# Patient Record
Sex: Male | Born: 1960 | Race: Black or African American | Hispanic: No | Marital: Single | Smoking: Former smoker
Health system: Southern US, Community
[De-identification: ages and names within clinical notes are randomized; demographics above are authoritative.]

## PROBLEM LIST (undated history)

## (undated) DIAGNOSIS — N289 Disorder of kidney and ureter, unspecified: Secondary | ICD-10-CM

## (undated) DIAGNOSIS — I10 Essential (primary) hypertension: Secondary | ICD-10-CM

## (undated) DIAGNOSIS — J40 Bronchitis, not specified as acute or chronic: Secondary | ICD-10-CM

## (undated) HISTORY — PX: DIALYSIS FISTULA CREATION: SHX611

---

## 2011-07-01 ENCOUNTER — Inpatient Hospital Stay: Payer: Self-pay | Admitting: Student

## 2018-05-16 ENCOUNTER — Emergency Department: Payer: Medicare Other

## 2018-05-16 ENCOUNTER — Other Ambulatory Visit: Payer: Self-pay

## 2018-05-16 ENCOUNTER — Encounter: Payer: Self-pay | Admitting: Emergency Medicine

## 2018-05-16 ENCOUNTER — Inpatient Hospital Stay
Admission: EM | Admit: 2018-05-16 | Discharge: 2018-05-19 | DRG: 640 | Disposition: A | Discharge status: Home / Self Care | Payer: Medicare Other | Attending: Specialist | Admitting: Specialist

## 2018-05-16 DIAGNOSIS — F17210 Nicotine dependence, cigarettes, uncomplicated: Secondary | ICD-10-CM | POA: Diagnosis present

## 2018-05-16 DIAGNOSIS — E1122 Type 2 diabetes mellitus with diabetic chronic kidney disease: Secondary | ICD-10-CM | POA: Diagnosis present

## 2018-05-16 DIAGNOSIS — Z79899 Other long term (current) drug therapy: Secondary | ICD-10-CM

## 2018-05-16 DIAGNOSIS — Z992 Dependence on renal dialysis: Secondary | ICD-10-CM

## 2018-05-16 DIAGNOSIS — E1142 Type 2 diabetes mellitus with diabetic polyneuropathy: Secondary | ICD-10-CM | POA: Diagnosis present

## 2018-05-16 DIAGNOSIS — I120 Hypertensive chronic kidney disease with stage 5 chronic kidney disease or end stage renal disease: Secondary | ICD-10-CM | POA: Diagnosis present

## 2018-05-16 DIAGNOSIS — N2581 Secondary hyperparathyroidism of renal origin: Secondary | ICD-10-CM | POA: Diagnosis present

## 2018-05-16 DIAGNOSIS — N186 End stage renal disease: Secondary | ICD-10-CM

## 2018-05-16 DIAGNOSIS — E875 Hyperkalemia: Secondary | ICD-10-CM | POA: Diagnosis present

## 2018-05-16 DIAGNOSIS — D631 Anemia in chronic kidney disease: Secondary | ICD-10-CM | POA: Diagnosis present

## 2018-05-16 HISTORY — DX: Disorder of kidney and ureter, unspecified: N28.9

## 2018-05-16 HISTORY — DX: Essential (primary) hypertension: I10

## 2018-05-16 LAB — CBC
HEMATOCRIT: 28.2 % — AB (ref 40.0–52.0)
HEMOGLOBIN: 9.3 g/dL — AB (ref 13.0–18.0)
MCH: 28 pg (ref 26.0–34.0)
MCHC: 33.1 g/dL (ref 32.0–36.0)
MCV: 84.6 fL (ref 80.0–100.0)
Platelets: 248 10*3/uL (ref 150–440)
RBC: 3.33 MIL/uL — AB (ref 4.40–5.90)
RDW: 20.2 % — ABNORMAL HIGH (ref 11.5–14.5)
WBC: 5.6 10*3/uL (ref 3.8–10.6)

## 2018-05-16 LAB — IRON AND TIBC
IRON: 66 ug/dL (ref 45–182)
SATURATION RATIOS: 37 % (ref 17.9–39.5)
TIBC: 177 ug/dL — AB (ref 250–450)
UIBC: 111 ug/dL

## 2018-05-16 LAB — FERRITIN: FERRITIN: 630 ng/mL — AB (ref 24–336)

## 2018-05-16 LAB — PHOSPHORUS: Phosphorus: 8.6 mg/dL — ABNORMAL HIGH (ref 2.5–4.6)

## 2018-05-16 MED ORDER — HEPARIN SODIUM (PORCINE) 1000 UNIT/ML DIALYSIS
20.0000 [IU]/kg | INTRAMUSCULAR | Status: DC | PRN
  Filled 2018-05-16: qty 2

## 2018-05-16 MED ORDER — METOPROLOL TARTRATE 25 MG PO TABS
25.0000 mg | ORAL_TABLET | Freq: Every day | ORAL | Status: DC
  Administered 2018-05-17 – 2018-05-19 (×2): 25 mg via ORAL
  Filled 2018-05-16 (×4): qty 1

## 2018-05-16 MED ORDER — GABAPENTIN 300 MG PO CAPS
300.0000 mg | ORAL_CAPSULE | Freq: Every day | ORAL | Status: DC | PRN
  Administered 2018-05-17: 300 mg via ORAL
  Filled 2018-05-16: qty 1

## 2018-05-16 MED ORDER — AMLODIPINE BESYLATE 10 MG PO TABS
10.0000 mg | ORAL_TABLET | Freq: Every day | ORAL | Status: DC
  Administered 2018-05-17 – 2018-05-19 (×2): 10 mg via ORAL
  Filled 2018-05-16 (×3): qty 1

## 2018-05-16 MED ORDER — SODIUM CHLORIDE 0.9 % IV SOLN
1.0000 g | Freq: Once | INTRAVENOUS | Status: AC
  Administered 2018-05-16: 1 g via INTRAVENOUS
  Filled 2018-05-16: qty 10

## 2018-05-16 MED ORDER — SEVELAMER CARBONATE 800 MG PO TABS
1600.0000 mg | ORAL_TABLET | Freq: Three times a day (TID) | ORAL | Status: DC
  Administered 2018-05-17 – 2018-05-19 (×5): 1600 mg via ORAL
  Filled 2018-05-16 (×9): qty 2

## 2018-05-16 MED ORDER — NEPRO/CARBSTEADY PO LIQD
237.0000 mL | Freq: Two times a day (BID) | ORAL | Status: DC
  Administered 2018-05-16 – 2018-05-19 (×5): 237 mL via ORAL

## 2018-05-16 MED ORDER — DOCUSATE SODIUM 100 MG PO CAPS: 100.0000 mg | ORAL_CAPSULE | Freq: Two times a day (BID) | ORAL | Status: DC | PRN

## 2018-05-16 MED ORDER — CHLORHEXIDINE GLUCONATE CLOTH 2 % EX PADS
6.0000 | MEDICATED_PAD | Freq: Every day | CUTANEOUS | Status: DC
  Administered 2018-05-17 – 2018-05-18 (×2): 6 via TOPICAL

## 2018-05-16 MED ORDER — HEPARIN SODIUM (PORCINE) 5000 UNIT/ML IJ SOLN
5000.0000 [IU] | Freq: Three times a day (TID) | INTRAMUSCULAR | Status: DC
  Filled 2018-05-16 (×2): qty 1

## 2018-05-16 NOTE — Progress Notes (Signed)
New Admit   Mental Orientation: A&OX4 Telemetry: yes, NSR Assessment: Fistula RUE, good brui and thrill Skin: Pt has black spots on skin all over, intact, per pt this is from old hives that he gets with blood transfusions. Pain: denies Safety Measures: Pt independent in room, family at bedside Admission: Complete 1A Orientation: Complete

## 2018-05-16 NOTE — ED Notes (Signed)
Pt is a dialysis pt has not been dialyzed since last Tuesday [place] is from [place] has no doctor in the area  Pt is awake and alert   Reports shortness of breath on exertion   He has a graft in his left arm    He is placed on cardiac monitor

## 2018-05-16 NOTE — H&P (Signed)
[facility] [place] NAME: Jeremy [name]    MR#:  [id number]  DATE OF BIRTH:  10/05/1960  DATE OF ADMISSION:  05/16/2018  PRIMARY CARE PHYSICIAN: Patient, No Pcp Per   REQUESTING/REFERRING PHYSICIAN: Siadecki  CHIEF COMPLAINT:   Chief Complaint  Patient presents with  . Weakness  . Vascular Access Problem    HISTORY OF PRESENT ILLNESS: Jeremy [name]  is a 57 y.o. male with a known history of hypertension, end-stage renal disease on hemodialysis with dialysis on Tuesday Thursday and Saturday.  Lives in [place] but now planning to move over here, last dialysis was done on Tuesday. Since then he came here, and did not has his dialysis on Thursday or Saturday.  He started feeling chest tightness and came to emergency room for having his hemodialysis. His plans are to permanently stay in [place] now.  PAST MEDICAL HISTORY:   Past Medical History:  Diagnosis Date  . Hypertension   . Renal disorder    kidney failure, dialysis    PAST SURGICAL HISTORY:  Past Surgical History:  Procedure Laterality Date  . DIALYSIS FISTULA CREATION      SOCIAL HISTORY:  Social History   Tobacco Use  . Smoking status: Current Every Day Smoker    Packs/day: 1.00    Types: Cigarettes  . Smokeless tobacco: Never Used  Substance Use Topics  . Alcohol use: Never    Frequency: Never    FAMILY HISTORY:  Family History  Problem Relation Age of Onset  . Hypertension Mother     DRUG ALLERGIES: No Known Allergies  REVIEW OF SYSTEMS:   CONSTITUTIONAL: No fever, fatigue or weakness.  EYES: No blurred or double vision.  EARS, NOSE, AND THROAT: No tinnitus or ear pain.  RESPIRATORY: No cough, shortness of breath, wheezing or hemoptysis.  CARDIOVASCULAR: No chest pain, orthopnea, edema.  GASTROINTESTINAL: No nausea, vomiting, diarrhea or abdominal pain.  GENITOURINARY: No dysuria, hematuria.  ENDOCRINE: No polyuria, nocturia,  HEMATOLOGY: No anemia,  easy bruising or bleeding SKIN: No rash or lesion. MUSCULOSKELETAL: No joint pain or arthritis.   NEUROLOGIC: No tingling, numbness, weakness.  PSYCHIATRY: No anxiety or depression.   MEDICATIONS AT HOME:  Prior to Admission medications   Medication Sig Start Date End Date Taking? Authorizing Provider  amLODipine (NORVASC) 10 MG tablet Take 10 mg by mouth daily. 03/29/18  Yes [provider]  gabapentin (NEURONTIN) 300 MG capsule Take 300 mg by mouth daily as needed. 10/01/15  Yes [provider]  metoprolol tartrate (LOPRESSOR) 25 MG tablet Take 25 mg by mouth daily. 03/29/18  Yes [provider]  sevelamer carbonate (RENVELA) 800 MG tablet Take 1,600 mg by mouth 3 (three) times daily with meals. 03/02/18  Yes [provider]      PHYSICAL EXAMINATION:   VITAL SIGNS: Blood pressure (!) 142/69, pulse 64, temperature 98.6 F (37 C), temperature source Oral, resp. rate 20, height 5\' 6"  (1.676 m), weight 65.3 kg, SpO2 94 %.  GENERAL:  57 y.o.-year-old patient lying in the bed with no acute distress.  EYES: Pupils equal, round, reactive to light and accommodation. No scleral icterus. Extraocular muscles intact.  HEENT: Head atraumatic, normocephalic. Oropharynx and nasopharynx clear.  NECK:  Supple, no jugular venous distention. No thyroid enlargement, no tenderness.  LUNGS: Normal breath sounds bilaterally, no wheezing, some crepitation. No use of accessory muscles of respiration.  CARDIOVASCULAR: S1, S2 normal. No murmurs, rubs, or gallops.  ABDOMEN: Soft, nontender,  nondistended. Bowel sounds present. No organomegaly or mass.  EXTREMITIES: No pedal edema, cyanosis, or clubbing.  Left AV fistula present NEUROLOGIC: Cranial nerves II through XII are intact. Muscle strength 5/5 in all extremities. Sensation intact. Gait not checked.  PSYCHIATRIC: The patient is alert and oriented x 3.  SKIN: No obvious rash, lesion, or ulcer.   LABORATORY PANEL:    CBC Recent Labs  Lab 05/16/18 1210  WBC 5.6  HGB 9.3*  HCT 28.2*  PLT 248  MCV 84.6  MCH 28.0  MCHC 33.1  RDW 20.2*   ------------------------------------------------------------------------------------------------------------------  Chemistries  Recent Labs  Lab 05/16/18 1210  NA 141  K 6.4*  CL 105  CO2 17*  GLUCOSE 141*  BUN 127*  CREATININE 20.71*  CALCIUM 8.2*   ------------------------------------------------------------------------------------------------------------------ estimated creatinine clearance is 3.6 mL/min (A) (by C-G formula based on SCr of 20.71 mg/dL (H)). ------------------------------------------------------------------------------------------------------------------ No results for input(s): TSH, T4TOTAL, T3FREE, THYROIDAB in the last 72 hours.  Invalid input(s): FREET3   Coagulation profile No results for input(s): INR, PROTIME in the last 168 hours. ------------------------------------------------------------------------------------------------------------------- No results for input(s): DDIMER in the last 72 hours. -------------------------------------------------------------------------------------------------------------------  Cardiac Enzymes No results for input(s): CKMB, TROPONINI, MYOGLOBIN in the last 168 hours.  Invalid input(s): CK ------------------------------------------------------------------------------------------------------------------ Invalid input(s): POCBNP  ---------------------------------------------------------------------------------------------------------------  Urinalysis No results found for: COLORURINE, APPEARANCEUR, LABSPEC, PHURINE, GLUCOSEU, HGBUR, BILIRUBINUR, KETONESUR, PROTEINUR, UROBILINOGEN, NITRITE, LEUKOCYTESUR   RADIOLOGY: Dg Chest 2 View  Result Date: 05/16/2018 CLINICAL DATA:  Weakness. Shortness of breath. Last dialysis treatment was 5 days ago. EXAM: CHEST - 2 VIEW COMPARISON:  None.  FINDINGS: Heart is enlarged. Mild pulmonary vascular congestion is present. No significant airspace consolidation is present. No effusions are present. Left subclavian stent is noted. The visualized soft tissues and bony thorax are otherwise unremarkable. IMPRESSION: 1. Cardiomegaly and mild pulmonary vascular congestion likely reflects early congestive heart failure. 2. No significant airspace consolidation to suggest infection. Electronically Signed   By: San Morelle M.D.   On: 05/16/2018 12:53    EKG: Orders placed or performed during the hospital encounter of 05/16/18  . ED EKG  . ED EKG    IMPRESSION AND PLAN:  *Hyperkalemia Missed hemodialysis. Nephrology has seen the patient and plan for urgent dialysis today.  *End-stage renal disease on hemodialysis Missed last 2 dialysis, nephrologist is aware. He will need to set up outpatient hemodialysis here as moved to [place] now.  *Hypertension Continue home medications for now and monitor.  *Neuropathy Continue gabapentin.  *Active smoking Counseled to quit smoking and offered nicotine patch.  Time spent 4 minutes for this.  Patient moved from [place], he will need to have a primary care doctor and hemodialysis as outpatient set up before discharge.  All the records are reviewed and case discussed with ED provider. Management plans discussed with the patient, family and they are in agreement.  CODE STATUS: Full.   TOTAL TIME TAKING CARE OF THIS PATIENT: 45 minutes.    Vaughan Basta M.D on 05/16/2018   Between 7am to 6pm - Pager - [phone]  After 6pm go to [email or web address] - password [email or web address] [facility]  Office  [phone]  CC: Primary care physician; Patient, No Pcp Per   Note: This dictation was prepared with Dragon dictation along with smaller phrase technology. Any transcriptional errors that result from this process are unintentional.

## 2018-05-16 NOTE — ED Provider Notes (Signed)
[facility] Emergency Department Provider Note ____________________________________________   First MD Initiated Contact with Patient 05/16/18 1414     (approximate)  I have reviewed the triage vital signs and the nursing notes.   HISTORY  Chief Complaint Weakness and Vascular Access Problem    HPI Jeremy [name] is a 57 y.o. male with PMH as noted below including hypertension and ESRD on dialysis who presents with shortness of breath, gradual onset over the last several days, and persistent course.  The patient reports some generalized weakness.  He states that he just moved here from [place] this week, and has not had dialysis in 5 days, since last Tuesday.  The patient states that he has no doctors here or dialysis set up at this time.  Past Medical History:  Diagnosis Date  . Hypertension   . Renal disorder    kidney failure, dialysis    There are no active problems to display for this patient.   Past Surgical History:  Procedure Laterality Date  . DIALYSIS FISTULA CREATION      Prior to Admission medications   Medication Sig Start Date End Date Taking? Authorizing Provider  amLODipine (NORVASC) 10 MG tablet Take 10 mg by mouth daily. 03/29/18  Yes [provider]  gabapentin (NEURONTIN) 300 MG capsule Take 300 mg by mouth daily as needed. 10/01/15  Yes [provider]  metoprolol tartrate (LOPRESSOR) 25 MG tablet Take 25 mg by mouth daily. 03/29/18  Yes [provider]  sevelamer carbonate (RENVELA) 800 MG tablet Take 1,600 mg by mouth 3 (three) times daily with meals. 03/02/18  Yes [provider]    Allergies Patient has no known allergies.  No family history on file.  Social History Social History   Tobacco Use  . Smoking status: Current Every Day Smoker    Packs/day: 1.00    Types: Cigarettes  . Smokeless tobacco: Never Used  Substance Use Topics  . Alcohol use: Never    Frequency: Never  .  Drug use: Not on file    Review of Systems  Constitutional: No fever. Eyes: No redness. ENT: No sore throat. Cardiovascular: Denies chest pain. Respiratory: Positive for shortness of breath. Gastrointestinal: No vomiting.  Positive for diarrhea.  Genitourinary: Negative for dysuria.  Musculoskeletal: Negative for back pain. Skin: Negative for rash. Neurological: Negative for headache.   ____________________________________________   PHYSICAL EXAM:  VITAL SIGNS: ED Triage Vitals [05/16/18 1201]  Enc Vitals Group     BP 139/65     Pulse Rate 65     Resp 18     Temp 98.6 F (37 C)     Temp Source Oral     SpO2 98 %     Weight 144 lb (65.3 kg)     Height 5\' 6"  (1.676 m)     Head Circumference      Peak Flow      Pain Score 0     Pain Loc      Pain Edu?      Excl. in [place]?     Constitutional: Alert and oriented.  Relatively well appearing and in no acute distress. Eyes: Conjunctivae are normal.  Head: Atraumatic. Nose: No congestion/rhinnorhea. Mouth/Throat: Mucous membranes are somewhat dry.   Neck: Normal range of motion.  Cardiovascular: Normal rate, regular rhythm. Grossly normal heart sounds.  Good peripheral circulation. Respiratory: Normal respiratory effort.  No retractions.  Decreased breath sounds to bases with rhonchi bilaterally. Gastrointestinal: Soft and nontender.  No distention.  Genitourinary: No flank tenderness. Musculoskeletal: Extremities warm and well perfused.  Neurologic:  Normal speech and language. No gross focal neurologic deficits are appreciated.  Skin:  Skin is warm and dry. No rash noted. Psychiatric: Mood and affect are normal. Speech and behavior are normal.  ____________________________________________   LABS (all labs ordered are listed, but only abnormal results are displayed)  Labs Reviewed  BASIC METABOLIC PANEL - Abnormal; Notable for the following components:      Result Value   Potassium 6.4 (*)    CO2 17 (*)     Glucose, Bld 141 (*)    BUN 127 (*)    Creatinine, Ser 20.71 (*)    Calcium 8.2 (*)    GFR calc non Af Amer 2 (*)    GFR calc Af Amer 2 (*)    Anion gap 19 (*)    All other components within normal limits  CBC - Abnormal; Notable for the following components:   RBC 3.33 (*)    Hemoglobin 9.3 (*)    HCT 28.2 (*)    RDW 20.2 (*)    All other components within normal limits  URINALYSIS, COMPLETE (UACMP) WITH MICROSCOPIC   ____________________________________________  EKG  ED ECG REPORT I, Arta Silence, the attending physician, personally viewed and interpreted this ECG.  Date: 05/16/2018 EKG Time: 1211 Rate: 64 Rhythm: normal sinus rhythm QRS Axis: normal Intervals: normal ST/T Wave abnormalities: Peaked T waves laterally Narrative Interpretation: no evidence of acute ischemia  ____________________________________________  RADIOLOGY  CXR: Pulmonary vascular congestion  ____________________________________________   PROCEDURES  Procedure(s) performed: No  Procedures  Critical Care performed: No ____________________________________________   INITIAL IMPRESSION / ASSESSMENT AND PLAN / ED COURSE  Pertinent labs & imaging results that were available during my care of the patient were reviewed by me and considered in my medical decision making (see chart for details).  57 year old male with history of ESRD on dialysis presents with shortness of breath after not having received dialysis in almost a week due to recently moving to the area without any medical care set up.  On exam, the patient is relatively well-appearing and his vital signs are normal except for borderline low O2 saturation.  He has no respiratory distress at this time.  The remainder of the exam is as described above.  Initial labs reveal significantly elevated creatinine and hyperkalemia with peaked T waves on EKG.  The patient will require urgent dialysis today.  I consulted Dr. Candiss Norse from  nephrology who has evaluated the patient in the ED and advises that the patient will be able to get dialysis today.  He also recommends admitting the patient as he may need more than one treatment and will need to be set up with outpatient follow-up.  The patient agrees with this plan.  I signed the patient out to the hospitalist Dr. Estanislado Pandy.  ____________________________________________   FINAL CLINICAL IMPRESSION(S) / ED DIAGNOSES  Final diagnoses:  Hyperkalemia  End stage renal disease ([place])      NEW MEDICATIONS STARTED DURING THIS VISIT:  New Prescriptions   No medications on file     Note:  This document was prepared using Dragon voice recognition software and may include unintentional dictation errors.   Arta Silence, MD 05/16/18 [phone]

## 2018-05-16 NOTE — Progress Notes (Signed)
This note also relates to the following rows which could not be included: Pulse Rate - Cannot attach notes to unvalidated device data Resp - Cannot attach notes to unvalidated device data BP - Cannot attach notes to unvalidated device data  Hd complete

## 2018-05-16 NOTE — Consult Note (Signed)
Date: 05/16/2018                  Patient Name:  Jeremy [name]  MRN: [id number]  DOB: 04/10/1961  Age / Sex: 57 y.o., male         PCP: No primary care provider on file.                 Service Requesting Consult: ER/ Arta Silence, MD                 Reason for Consult: ESRD            History of Present Illness: Patient is a 57 y.o. male with medical problems of ESRD who was admitted to [facility] on 05/16/2018.  Patient presented to the emergency room for feeling poorly.  He has not been dialyzed since Tuesday.  He moved down from [place] area without having dialysis unit set up. In the emergency room, blood pressure is 136/65, sats 94% on room air However, labs show critically elevated BUN, creatinine of 20.7, potassium 6.4.  Chest x-ray shows cardiomegaly and mild pulmonary vascular congestion. Urgent nephrology evaluation has been requested for hyperkalemia, dialysis as well as to set up local dialysis unit.  Patient states he has moved down for now and is looking for a place to stay. He is accompanied in the emergency room with his fiance/girlfriend  Medications: Outpatient medications:  (Not in a hospital admission)  Current medications: Current Facility-Administered Medications  Medication Dose Route Frequency Provider Last Rate Last Dose  . calcium gluconate 1 g in sodium chloride 0.9 % 100 mL IVPB  1 g Intravenous Once Arta Silence, MD 330 mL/hr at 05/16/18 1456 1 g at 05/16/18 1456   Current Outpatient Medications  Medication Sig Dispense Refill  . amLODipine (NORVASC) 10 MG tablet Take 10 mg by mouth daily.    Marland Kitchen gabapentin (NEURONTIN) 300 MG capsule Take 300 mg by mouth daily as needed.    . metoprolol tartrate (LOPRESSOR) 25 MG tablet Take 25 mg by mouth daily.    . sevelamer carbonate (RENVELA) 800 MG tablet Take 1,600 mg by mouth 3 (three) times daily with meals.        Allergies: No Known Allergies    Past Medical History: Past Medical History:   Diagnosis Date  . Hypertension   . Renal disorder    kidney failure, dialysis     Past Surgical History: Past Surgical History:  Procedure Laterality Date  . DIALYSIS FISTULA CREATION       Family History: No family history on file.   Social History: Social History   Socioeconomic History  . Marital status: Divorced    Spouse name: Not on file  . Number of children: Not on file  . Years of education: Not on file  . Highest education level: Not on file  Occupational History  . Not on file  Social Needs  . Financial resource strain: Not on file  . Food insecurity:    Worry: Not on file    Inability: Not on file  . Transportation needs:    Medical: Not on file    Non-medical: Not on file  Tobacco Use  . Smoking status: Current Every Day Smoker    Packs/day: 1.00    Types: Cigarettes  . Smokeless tobacco: Never Used  Substance and Sexual Activity  . Alcohol use: Never    Frequency: Never  . Drug use: Not on file  . Sexual activity:  Not on file  Lifestyle  . Physical activity:    Days per week: Not on file    Minutes per session: Not on file  . Stress: Not on file  Relationships  . Social connections:    Talks on phone: Not on file    Gets together: Not on file    Attends religious service: Not on file    Active member of club or organization: Not on file    Attends meetings of clubs or organizations: Not on file    Relationship status: Not on file  . Intimate partner violence:    Fear of current or ex partner: Not on file    Emotionally abused: Not on file    Physically abused: Not on file    Forced sexual activity: Not on file  Other Topics Concern  . Not on file  Social History Narrative  . Not on file     Review of Systems: Gen: No weight changes however patient states that he is unable to gain much weight HEENT: No vision or hearing problems CV: No chest pain or shortness of breath Resp: No cough or sputum production GI: No nausea or  vomiting GU : Patient is anuric.  Denies any hematuria MS: No complaints Derm: No complaints Psych: No complaints Heme: No complaints Neuro: No complaints Endocrine.  No complaints  Vital Signs: Blood pressure 136/65, pulse 64, temperature 98.6 F (37 C), temperature source Oral, resp. rate 17, height 5\' 6"  (1.676 m), weight 65.3 kg, SpO2 96 %.  No intake or output data in the 24 hours ending 05/16/18 1459  Weight trends: Filed Weights   05/16/18 1201  Weight: 65.3 kg    Physical Exam: General:  No acute distress, laying in the bed, uremic breath  HEENT  anicteric, moist oral mucous membranes  Neck:  Supple, no JVD  Lungs:  Clear to auscultation bilaterally  Heart::  Regular rate and rhythm, normal sinus on telemetry  Abdomen:  Soft, nontender  Extremities:  No leg edema.   Neurologic:  Alert, oriented  Skin:  Warm, no acute rashes  Access:  Left arm AVF          Lab results: Basic Metabolic Panel: Recent Labs  Lab 05/16/18 1210  NA 141  K 6.4*  CL 105  CO2 17*  GLUCOSE 141*  BUN 127*  CREATININE 20.71*  CALCIUM 8.2*    Liver Function Tests: No results for input(s): AST, ALT, ALKPHOS, BILITOT, PROT, ALBUMIN in the last 168 hours. No results for input(s): LIPASE, AMYLASE in the last 168 hours. No results for input(s): AMMONIA in the last 168 hours.  CBC: Recent Labs  Lab 05/16/18 1210  WBC 5.6  HGB 9.3*  HCT 28.2*  MCV 84.6  PLT 248    Cardiac Enzymes: No results for input(s): CKTOTAL, TROPONINI in the last 168 hours.  BNP: Invalid input(s): POCBNP  CBG: No results for input(s): GLUCAP in the last 168 hours.  Microbiology: No results found for this or any previous visit (from the past 720 hour(s)).   Coagulation Studies: No results for input(s): LABPROT, INR in the last 72 hours.  Urinalysis: No results for input(s): COLORURINE, LABSPEC, PHURINE, GLUCOSEU, HGBUR, BILIRUBINUR, KETONESUR, PROTEINUR, UROBILINOGEN, NITRITE, LEUKOCYTESUR in  the last 72 hours.  Invalid input(s): APPERANCEUR      Imaging: Dg Chest 2 View  Result Date: 05/16/2018 CLINICAL DATA:  Weakness. Shortness of breath. Last dialysis treatment was 5 days ago. EXAM: CHEST - 2 VIEW  COMPARISON:  None. FINDINGS: Heart is enlarged. Mild pulmonary vascular congestion is present. No significant airspace consolidation is present. No effusions are present. Left subclavian stent is noted. The visualized soft tissues and bony thorax are otherwise unremarkable. IMPRESSION: 1. Cardiomegaly and mild pulmonary vascular congestion likely reflects early congestive heart failure. 2. No significant airspace consolidation to suggest infection. Electronically Signed   By: San Morelle M.D.   On: 05/16/2018 12:53      Assessment & Plan: Pt is a 57 y.o. African American  male with ESRD on HD since 2012, HTN, h/o DM now diet controlled, h/o CHF, Cardiac ablation, h/o AVF stent and angioplasty, was admitted on 05/16/2018 with hyperkalemia  No dialysis unit  1.  End-stage renal disease Patient presents with uremia with critically elevated BUN, Cr and potassium. He does not currently have a dialysis unit.  For hyperkalemia and uremia, we will do a short treatment today.  We will also do another treatment tomorrow and contact discharge planning service to set up the unit in the local area.  2.  Anemia chronic kidney disease Monitor hemoglobin and give EPO with hemodialysis  3.  Secondary hyperparathyroidism Monitor phosphorus during hospital admission May continue home dose of sevelamer  4. hyperkalemia Low potassium diet Expected to correct with dialysis today.      LOS: 0 Courteney Alderete Candiss Norse 9/8/20192:59 PM  [place], [place]  Note: This note was prepared with Dragon dictation. Any transcription errors are unintentional

## 2018-05-16 NOTE — ED Triage Notes (Signed)
Patient presents to the ED with weakness and shortness of breath.  Patient states his last dialysis was on Tuesday.  Patient just moved from [place] and has not yet established a nephrologist in [place].

## 2018-05-16 NOTE — Progress Notes (Signed)
This note also relates to the following rows which could not be included: Pulse Rate - Cannot attach notes to unvalidated device data Resp - Cannot attach notes to unvalidated device data BP - Cannot attach notes to unvalidated device data  Hd started  

## 2018-05-16 NOTE — ED Notes (Signed)
No sticks bp  l  Arm

## 2018-05-17 LAB — BASIC METABOLIC PANEL
Anion gap: 19 — ABNORMAL HIGH (ref 5–15)
Anion gap: 14 (ref 5–15)
BUN: 127 mg/dL — AB (ref 6–20)
BUN: 88 mg/dL — AB (ref 6–20)
CALCIUM: 7.7 mg/dL — AB (ref 8.9–10.3)
CO2: 17 mmol/L — ABNORMAL LOW (ref 22–32)
CO2: 24 mmol/L (ref 22–32)
CREATININE: 20.71 mg/dL — AB (ref 0.61–1.24)
Calcium: 8.2 mg/dL — ABNORMAL LOW (ref 8.9–10.3)
Chloride: 105 mmol/L (ref 98–111)
Chloride: 103 mmol/L (ref 98–111)
Creatinine, Ser: 14.35 mg/dL — ABNORMAL HIGH (ref 0.61–1.24)
GFR calc Af Amer: 2 mL/min — ABNORMAL LOW (ref >60)
GFR calc Af Amer: 4 mL/min — ABNORMAL LOW (ref >60)
GFR, EST NON AFRICAN AMERICAN: 2 mL/min — AB (ref >60)
GFR, EST NON AFRICAN AMERICAN: 3 mL/min — AB (ref >60)
GLUCOSE: 101 mg/dL — AB (ref 70–99)
Glucose, Bld: 141 mg/dL — ABNORMAL HIGH (ref 70–99)
POTASSIUM: 6.4 mmol/L — AB (ref 3.5–5.1)
POTASSIUM: 4.5 mmol/L (ref 3.5–5.1)
Sodium: 141 mmol/L (ref 135–145)
Sodium: 141 mmol/L (ref 135–145)

## 2018-05-17 LAB — CBC
HEMATOCRIT: 26.2 % — AB (ref 40.0–52.0)
Hemoglobin: 9 g/dL — ABNORMAL LOW (ref 13.0–18.0)
MCH: 28.6 pg (ref 26.0–34.0)
MCHC: 34.4 g/dL (ref 32.0–36.0)
MCV: 83.1 fL (ref 80.0–100.0)
Platelets: 209 10*3/uL (ref 150–440)
RBC: 3.15 MIL/uL — ABNORMAL LOW (ref 4.40–5.90)
RDW: 19.9 % — AB (ref 11.5–14.5)
WBC: 4 10*3/uL (ref 3.8–10.6)

## 2018-05-17 MED ORDER — RENA-VITE PO TABS
1.0000 | ORAL_TABLET | Freq: Every day | ORAL | Status: DC
  Administered 2018-05-17: 1 via ORAL
  Filled 2018-05-17 (×3): qty 1

## 2018-05-17 MED ORDER — VITAMIN C 500 MG PO TABS
250.0000 mg | ORAL_TABLET | Freq: Two times a day (BID) | ORAL | Status: DC
  Administered 2018-05-18 – 2018-05-19 (×2): 250 mg via ORAL
  Filled 2018-05-17 (×5): qty 0.5

## 2018-05-17 NOTE — Progress Notes (Signed)
Jeremy [name]                                                                                                                                                                                  Patient Demographics   Jeremy [name], is a 57 y.o. male, DOB - 04-15-1961, XTK:[id number]  Admit date - 05/16/2018   Admitting Physician Vaughan Basta, MD  Outpatient Primary MD for the patient is Patient, No Pcp Per   LOS - 1  Subjective:  Patient admitted with hyperkalemia now better denies any pain today   Review of Systems:   CONSTITUTIONAL: No documented fever. No fatigue, weakness. No weight gain, no weight loss.  EYES: No blurry or double vision.  ENT: No tinnitus. No postnasal drip. No redness of the oropharynx.  RESPIRATORY: No cough, no wheeze, no hemoptysis. No dyspnea.  CARDIOVASCULAR: No chest pain. No orthopnea. No palpitations. No syncope.  GASTROINTESTINAL: No nausea, no vomiting or diarrhea. No abdominal pain. No melena or hematochezia.  GENITOURINARY: No dysuria or hematuria.  ENDOCRINE: No polyuria or nocturia. No heat or cold intolerance.  HEMATOLOGY: No anemia. No bruising. No bleeding.  INTEGUMENTARY: No rashes. No lesions.  MUSCULOSKELETAL: No arthritis. No swelling. No gout.  NEUROLOGIC: No numbness, tingling, or ataxia. No seizure-type activity.  PSYCHIATRIC: No anxiety. No insomnia. No ADD.    Vitals:   Vitals:   05/16/18 2243 05/16/18 2316 05/16/18 2317 05/17/18 0738  BP:  (!) 156/72  (!) 145/73  Pulse: 78 76  73  Resp: (!) 25 (!) 23    Temp:  98.7 F (37.1 C)  98.5 F (36.9 C)  TempSrc:  Oral  Oral  SpO2:  100%  97%  Weight:   62.7 kg   Height:        Wt Readings from Last 3 Encounters:  05/16/18 62.7 kg     Intake/Output Summary (Last 24 hours) at 05/17/2018 1156 Last data filed at 05/16/2018 2319 Gross per 24 hour  Intake 460 ml  Output 0 ml  Net 460 ml    Physical Exam:   GENERAL: Pleasant-appearing  in no apparent distress.  HEAD, EYES, EARS, NOSE AND THROAT: Atraumatic, normocephalic. Extraocular muscles are intact. Pupils equal and reactive to light. Sclerae anicteric. No conjunctival injection. No oro-pharyngeal erythema.  NECK: Supple. There is no jugular venous distention. No bruits, no lymphadenopathy, no thyromegaly.  HEART: Regular rate and rhythm,. No murmurs, no rubs, no clicks.  LUNGS: Clear to auscultation bilaterally. No rales or rhonchi. No wheezes.  ABDOMEN: Soft, flat, nontender, nondistended. Has good bowel sounds. No hepatosplenomegaly appreciated.  EXTREMITIES: No evidence of any cyanosis, clubbing, or peripheral  edema.  +2 pedal and radial pulses bilaterally.  NEUROLOGIC: The patient is alert, awake, and oriented x3 with no focal motor or sensory deficits appreciated bilaterally.  SKIN: Moist and warm with no rashes appreciated.  Psych: Not anxious, depressed LN: No inguinal LN enlargement    Antibiotics   Anti-infectives (From admission, onward)   None      Medications   Scheduled Meds: . amLODipine  10 mg Oral Daily  . Chlorhexidine Gluconate Cloth  6 each Topical Q0600  . feeding supplement (NEPRO CARB STEADY)  237 mL Oral BID BM  . heparin  5,000 Units Subcutaneous Q8H  . metoprolol tartrate  25 mg Oral Daily  . sevelamer carbonate  1,600 mg Oral TID WC   Continuous Infusions: PRN Meds:.docusate sodium, gabapentin   Data Review:   Micro Results No results found for this or any previous visit (from the past 240 hour(s)).  Radiology Reports Dg Chest 2 View  Result Date: 05/16/2018 CLINICAL DATA:  Weakness. Shortness of breath. Last dialysis treatment was 5 days ago. EXAM: CHEST - 2 VIEW COMPARISON:  None. FINDINGS: Heart is enlarged. Mild pulmonary vascular congestion is present. No significant airspace consolidation is present. No effusions are present. Left subclavian stent is noted. The visualized soft tissues and bony thorax are otherwise  unremarkable. IMPRESSION: 1. Cardiomegaly and mild pulmonary vascular congestion likely reflects early congestive heart failure. 2. No significant airspace consolidation to suggest infection. Electronically Signed   By: San Morelle M.D.   On: 05/16/2018 12:53     CBC Recent Labs  Lab 05/16/18 1210 05/17/18 0254  WBC 5.6 4.0  HGB 9.3* 9.0*  HCT 28.2* 26.2*  PLT 248 209  MCV 84.6 83.1  MCH 28.0 28.6  MCHC 33.1 34.4  RDW 20.2* 19.9*    Chemistries  Recent Labs  Lab 05/16/18 1210 05/17/18 0254  NA 141 141  K 6.4* 4.5  CL 105 103  CO2 17* 24  GLUCOSE 141* 101*  BUN 127* 88*  CREATININE 20.71* 14.35*  CALCIUM 8.2* 7.7*   ------------------------------------------------------------------------------------------------------------------ estimated creatinine clearance is 5 mL/min (A) (by C-G formula based on SCr of 14.35 mg/dL (H)). ------------------------------------------------------------------------------------------------------------------ No results for input(s): HGBA1C in the last 72 hours. ------------------------------------------------------------------------------------------------------------------ No results for input(s): CHOL, HDL, LDLCALC, TRIG, CHOLHDL, LDLDIRECT in the last 72 hours. ------------------------------------------------------------------------------------------------------------------ No results for input(s): TSH, T4TOTAL, T3FREE, THYROIDAB in the last 72 hours.  Invalid input(s): FREET3 ------------------------------------------------------------------------------------------------------------------ Recent Labs    05/16/18 2032  FERRITIN 630*  TIBC 177*  IRON 66    Coagulation profile No results for input(s): INR, PROTIME in the last 168 hours.  No results for input(s): DDIMER in the last 72 hours.  Cardiac Enzymes No results for input(s): CKMB, TROPONINI, MYOGLOBIN in the last 168 hours.  Invalid input(s):  CK ------------------------------------------------------------------------------------------------------------------ Invalid input(s): [place]  Patient is 57 year old on hemodialysis missed his hemodialysis day [place] another [place]  *Hyperkalemia That is post hemodialysis Now resolved  *End-stage renal disease on hemodialysis Patient will need outpatient spot in the dialysis [name] prior to discharge   *Hypertension Continue home medications for now and monitor.  *Neuropathy Continue gabapentin.  *Active smoking  patient told to stop smoking      Code Status Orders  (From admission, onward)         Start     Ordered   05/16/18 1709  Full code  Continuous     05/16/18 1709  Code Status History    This patient has a current code status but no historical code status.           Consults nephrology  DVT Prophylaxis heparin  Lab Results  Component Value Date   PLT 209 05/17/2018     Time Spent in minutes   35 minutes greater than 50% of time spent in care coordination and counseling patient regarding the condition and plan of care.   Dustin Flock M.D on 05/17/2018 at 11:56 AM  Between 7am to 6pm - Pager - [phone]  After 6pm go to [email or web address] - Proofreader  [employer]   Office  [phone]

## 2018-05-17 NOTE — Progress Notes (Addendum)
Initial Nutrition Assessment  DOCUMENTATION CODES:   Not applicable  INTERVENTION:  Nepro Shake po BID, each supplement provides 425 kcal and 19 grams protein  Renavite daily  Vitamin C 250mg  BID   NUTRITION DIAGNOSIS:   Increased nutrient needs related to chronic illness as evidenced by estimated needs.   GOAL:   Patient will meet greater than or equal to 90% of their needs   MONITOR:   PO intake, Supplement acceptance, Labs, Weight trends  REASON FOR ASSESSMENT:   Malnutrition Screening Tool    ASSESSMENT:   57 y.o. male w/ hx of ESRD, HTN admitted for chest pain and SOB r/t to hyperkalemia after not having HD since 9/3  Visited pt with family member in room. Per pt report, his previous dry wt was 66.2 kg and he has lost wt in the last few months. Family member reports that he eats a large variety of foods, but in recent months he has been eating less due to decreased stamina. Pt reports previously drinking Nepro shakes when he is able to and expresses a willingness to continue drinking them. He says he is currently not taking a renal multivitamin but expressed willingness to start. Educated pt on importance of micronutrient supplementation during hemodialysis. Provided pt handout on potassium and phosphorus content of foods.   Medications reviewed and include: Renvela, heparin Labs reviewed and include: Potassium 6.4 (H-9/8), Phosphorus 8.6 (H-9/8), Glucose 101 (H), BUN 88 (H), Creatinine 14.35 (H), Calcium 7.7 (L), TIBC 177 (L), Ferritin 630 (H), RBC 3.15 (L), Hbg 9.0 (L), HCT 26.2 (L), RDW 19.9 (H)   NUTRITION - FOCUSED PHYSICAL EXAM:    Most Recent Value  Orbital Region  Mild depletion  Upper Arm Region  Mild depletion  Thoracic and Lumbar Region  Mild depletion  Buccal Region  No depletion  Temple Region  Mild depletion  Clavicle Bone Region  Mild depletion  Clavicle and Acromion Bone Region  Mild depletion  Scapular Bone Region  Mild depletion  Dorsal  Hand  Mild depletion  Patellar Region  Mild depletion  Anterior Thigh Region  Mild depletion  Posterior Calf Region  Mild depletion  Edema (RD Assessment)  None  Hair  Reviewed  Eyes  Reviewed  Mouth  Reviewed  Skin  Reviewed [ecchymosis on upper arms]  Nails  Reviewed       Diet Order:   Diet Order            Diet renal with fluid restriction Fluid restriction: 1200 mL Fluid; Room service appropriate? Yes; Fluid consistency: Thin  Diet effective now              EDUCATION NEEDS:   Education needs have been addressed  Skin:  Skin Assessment: Reviewed RN Assessment(Dry, flaky; ecchymosis on upper arms)  Last BM:   05/17/18  Height:   Ht Readings from Last 1 Encounters:  05/16/18 5\' 6"  (1.676 m)    Weight:   Wt Readings from Last 1 Encounters:  05/16/18 62.7 kg    Ideal Body Weight:  64.5 kg  BMI:  Body mass index is 22.31 kg/m.  Estimated Nutritional Needs:   Kcal:  1700-2000  Protein:  80-90  Fluid:  1200 mL per MD    [place], Dietetic Intern [phone]

## 2018-05-17 NOTE — Clinical Social Work Note (Addendum)
Clinical Social Work Assessment  Patient Details  Name: Jeremy [name] MRN: [id number] Date of Birth: 15-Sep-1960  Date of referral:  05/17/18               Reason for consult:  Housing Concerns/Homelessness, [employer], Discharge Planning                Permission sought to share information with:  Other([employer] technologist) Permission granted to share information::  Yes, Verbal Permission Granted  Name::      Personnel officer::   [facility]  Relationship::   Research scientist (life sciences) Information:     Housing/Transportation Living arrangements for the past 2 months:  [place], Homeless Source of Information:  Patient, Other (Comment Required)(girlfriend ) Patient Interpreter Needed:  None Criminal Activity/Legal Involvement Pertinent to Current Situation/Hospitalization:  No - Comment as needed Significant Relationships:  Significant Other Lives with:  Significant Other Do you feel safe going back to the place where you live?  Yes Need for family participation in patient care:  No (Coment)  Care giving concerns:  Patient reported that he is homeless and can't afford to stay in a hotel any longer.    Social Worker assessment / plan:  Holiday representative ([place]) received verbal consult from RN in progression rounds this morning and that patient is homeless and needs to be established with dialysis in [place], [place]. Justice met with patient and his girlfriend Jeremy [name] was at bedside. Patient was alert and oriented X4 and was laying in the bed. CSW introduced self and explained role of CSW department. Per patient him and his girlfriend were living in [place] however he got evicted because he was 7 months behind on his rent. Patient reported that he was going to outpatient dialysis in [place] and is on disability/SSI. Patient reported that him and his girlfriend came to [place], [place] to start a new life. Patient reported that he has a car and driver's  license. Per patient he drove his car to [place]. Patient reported that he has been in [place] for 5 days and has stayed at different hotels. Per patient he stayed at the [employer] in [place] and other hotels in [place]. Per patient he can't afford to stay in a hotel any longer. Patient reported that he needs to get established with a dialysis clinic in the area. CSW contacted [facility] dialysis coordinator and made her aware of above. Estill Bamberg is working on outpatient dialysis placement. Patient reported he is looking for low income apartments. CSW provided patient with a list of housing resources including [place], [place] and [employer]. CSW explained that these housing options have waiting lists and are not emergency housing. Patient reported that he has been calling around to different apartment complexes in search of something he can afford. CSW also made patient aware of the local shelter [facility]. Patient reported that staying at the shelter will be his last resort. Patient did give CSW permission to contact [employer] and give him information about patient and his girlfriend. Patient's girlfriend stated that she has been staying with patient and is also homeless. Patient reported that he will continue to search for housing. Per patient he can drive his car to dialysis.   CSW contacted [employer] and made him aware of above. Per Ulice Dash patient can stay at the shelter a few nights and they will assist him. CSW will continue to follow  and assist as needed.   Employment status:  Disabled (Comment on whether or not currently receiving Disability) Insurance information:  Medicare PT Recommendations:  Not assessed at this time Information / Referral to community resources:  Shelter  Patient/Family's Response to care:  Patient reported he will continue to look for housing.   Patient/Family's Understanding of and Emotional  Response to Diagnosis, Current Treatment, and Prognosis:  Patient and his girlfriend were pleasant and thanked CSW for assistance.   Emotional Assessment Appearance:  Appears stated age Attitude/Demeanor/Rapport:    Affect (typically observed):  Accepting, Adaptable, Pleasant Orientation:  Oriented to Self, Oriented to Place, Oriented to  Time, Oriented to Situation Alcohol / Substance use:  Not Applicable Psych involvement (Current and /or in the community):  No (Comment)  Discharge Needs  Concerns to be addressed:  Discharge Planning Concerns Readmission within the last 30 days:  No Current discharge risk:  Homeless Barriers to Discharge:  Continued Medical Work up   [employer], [employer], LCSW 05/17/2018, 2:09 PM

## 2018-05-17 NOTE — Progress Notes (Signed)
[facility]  ROUNDING NOTE   Subjective:  Patient seen at bedside. Did have hemodialysis yesterday. Was dialyzing on Tuesday, Thursday, Saturday as an outpatient in [place]. He has no outpatient hemodialysis center to go to at the moment.  Objective:  Vital signs in last 24 hours:  Temp:  [97.8 F (36.6 C)-98.8 F (37.1 C)] 98.5 F (36.9 C) (09/09 0738) Pulse Rate:  [62-80] 73 (09/09 0738) Resp:  [16-25] 23 (09/08 2316) BP: (133-156)/(63-95) 145/73 (09/09 0738) SpO2:  [94 %-100 %] 97 % (09/09 0738) Weight:  [62.7 kg-65.3 kg] 62.7 kg (09/08 2317)  Weight change:  Filed Weights   05/16/18 1201 05/16/18 2317  Weight: 65.3 kg 62.7 kg    Intake/Output: I/O last 3 completed shifts: In: 460 [P.O.:360; IV Piggyback:100] Out: 0    Intake/Output this shift:  No intake/output data recorded.  Physical Exam: General: No acute distress  Head: Normocephalic, atraumatic. Moist oral mucosal membranes  Eyes: Anicteric  Neck: Supple, trachea midline  Lungs:  Clear to auscultation, normal effort  Heart: S1S2 no rubs  Abdomen:  Soft, nontender, bowel sounds present  Extremities:  peripheral edema.  Neurologic: Awake, alert, following commands  Skin: No lesions  Access: LUE AVF    Basic Metabolic Panel: Recent Labs  Lab 05/16/18 1210 05/16/18 2032 05/17/18 0254  NA 141  --  141  K 6.4*  --  4.5  CL 105  --  103  CO2 17*  --  24  GLUCOSE 141*  --  101*  BUN 127*  --  88*  CREATININE 20.71*  --  14.35*  CALCIUM 8.2*  --  7.7*  PHOS  --  8.6*  --     Liver Function Tests: No results for input(s): AST, ALT, ALKPHOS, BILITOT, PROT, ALBUMIN in the last 168 hours. No results for input(s): LIPASE, AMYLASE in the last 168 hours. No results for input(s): AMMONIA in the last 168 hours.  CBC: Recent Labs  Lab 05/16/18 1210 05/17/18 0254  WBC 5.6 4.0  HGB 9.3* 9.0*  HCT 28.2* 26.2*  MCV 84.6 83.1  PLT 248 209    Cardiac Enzymes: No results for  input(s): CKTOTAL, CKMB, CKMBINDEX, TROPONINI in the last 168 hours.  BNP: Invalid input(s): POCBNP  CBG: No results for input(s): GLUCAP in the last 168 hours.  Microbiology: No results found for this or any previous visit.  Coagulation Studies: No results for input(s): LABPROT, INR in the last 72 hours.  Urinalysis: No results for input(s): COLORURINE, LABSPEC, PHURINE, GLUCOSEU, HGBUR, BILIRUBINUR, KETONESUR, PROTEINUR, UROBILINOGEN, NITRITE, LEUKOCYTESUR in the last 72 hours.  Invalid input(s): APPERANCEUR    Imaging: Dg Chest 2 View  Result Date: 05/16/2018 CLINICAL DATA:  Weakness. Shortness of breath. Last dialysis treatment was 5 days ago. EXAM: CHEST - 2 VIEW COMPARISON:  None. FINDINGS: Heart is enlarged. Mild pulmonary vascular congestion is present. No significant airspace consolidation is present. No effusions are present. Left subclavian stent is noted. The visualized soft tissues and bony thorax are otherwise unremarkable. IMPRESSION: 1. Cardiomegaly and mild pulmonary vascular congestion likely reflects early congestive heart failure. 2. No significant airspace consolidation to suggest infection. Electronically Signed   By: San Morelle M.D.   On: 05/16/2018 12:53     Medications:    . amLODipine  10 mg Oral Daily  . Chlorhexidine Gluconate Cloth  6 each Topical Q0600  . feeding supplement (NEPRO CARB STEADY)  237 mL Oral BID BM  . heparin  5,000 Units  Subcutaneous Q8H  . metoprolol tartrate  25 mg Oral Daily  . sevelamer carbonate  1,600 mg Oral TID WC   docusate sodium, gabapentin  Assessment/ Plan:  56 y.o. male with ESRD on HD since 2012, HTN, h/o DM now diet controlled, h/o CHF, Cardiac ablation, h/o AVF stent and angioplasty, was admitted on 05/16/2018 with hyperkalemia  No dialysis unit  1.  End-stage renal disease She did have hemodialysis yesterday.  We will schedule dialysis again for tomorrow.  In the meantime we will also begin search  for suitable outpatient hemodialysis center for the patient.  He has no place to live at the moment.  2.  Anemia chronic kidney disease Hemoglobin currently 9.0.  We will start the patient on Epogen as an outpatient.  3.  Secondary hyperparathyroidism Recheck serum phosphorus tomorrow.  Maintain the patient on Renvela 1600 mg p.o. 3 times daily.  4. hyperkalemia: Potassium now corrected down to 4.5.  Continue to monitor.   LOS: 1 Jerid Catherman 9/9/201911:19 AM

## 2018-05-17 NOTE — Progress Notes (Signed)
Critical Lab Result:Potassium 6.4 Date Drawn: 05-16-2018 Time drawn: 12:10 Reported via [place]  05-17-2018 @11 :02 am   Shreyang,Patel MD notified

## 2018-05-18 LAB — PHOSPHORUS: Phosphorus: 9 mg/dL — ABNORMAL HIGH (ref 2.5–4.6)

## 2018-05-18 LAB — HEPATITIS B CORE ANTIBODY, TOTAL: Hep B Core Total Ab: NEGATIVE

## 2018-05-18 LAB — HEPATITIS C ANTIBODY: HCV Ab: <0.1 s/co ratio (ref 0.0–0.9)

## 2018-05-18 LAB — HEPATITIS B SURFACE ANTIGEN: HEP B S AG: NEGATIVE

## 2018-05-18 LAB — HEPATITIS B SURFACE ANTIBODY,QUALITATIVE: HEP B S AB: REACTIVE

## 2018-05-18 LAB — HIV ANTIBODY (ROUTINE TESTING W REFLEX): HIV Screen 4th Generation wRfx: NONREACTIVE

## 2018-05-18 MED ORDER — TUBERCULIN PPD 5 UNIT/0.1ML ID SOLN
5.0000 [IU] | Freq: Once | INTRADERMAL | Status: DC
  Administered 2018-05-18: 5 [IU] via INTRADERMAL
  Filled 2018-05-18: qty 0.1

## 2018-05-18 NOTE — Progress Notes (Signed)
Post HD Treatment  Treatment complete and patient tolerated well. BVP was 67.2 and Kt was 51.3. No complaints noted by patient. Report called to Garlan Fair, RN.    05/18/18 1845  Hand-Off documentation  Report given to (Full Name) Garlan Fair, RN  Report received from (Full Name) Stephannie Peters, RN  Vital Signs  Temp 98.5 F (36.9 C)  Temp Source Oral  Pulse Rate 79  Pulse Rate Source Monitor  Resp 17  BP (!) 146/66  BP Location Right Arm  BP Method Automatic  Patient Position (if appropriate) Lying  Oxygen Therapy  SpO2 100 %  O2 Device Room Air  Dialysis Weight  Weight 63.6 kg  Type of Weight Post-Dialysis  Post-Hemodialysis Assessment  Rinseback Volume (mL) 250 mL  KECN  (Kt 51.3)  Dialyzer Clearance Lightly streaked  Duration of HD Treatment -hour(s) 3 hour(s)  Hemodialysis Intake (mL) 500 mL  UF Total -Machine (mL) 2014 mL  Net UF (mL) 1514 mL  Tolerated HD Treatment Yes  AVG/AVF Arterial Site Held (minutes) 10 minutes  AVG/AVF Venous Site Held (minutes) 10 minutes  Fistula / Graft Left Upper arm  Placement Date: 05/17/18   Placed prior to admission: Yes  Orientation: Left  Access Location: Upper arm  Site Condition No complications  Fistula / Graft Assessment Present;Thrill;Bruit  Drainage Description None

## 2018-05-18 NOTE — Progress Notes (Signed)
HD initiated via L AVF using 15 g needles x2. No current complaints. Patient stable for dialysis. Pending outpatient hd unit in process.

## 2018-05-18 NOTE — Progress Notes (Signed)
TB injection administered at 11:43 this AM into patient's right forearm. Pt tolerated well.

## 2018-05-18 NOTE — Progress Notes (Signed)
Pt off floor for HD.

## 2018-05-18 NOTE — Care Management (Addendum)
Per Estill Bamberg, the dialysis coordinator. Patient has a chair time for dialysis at [facility] 12:00. Can Start Thursday

## 2018-05-18 NOTE — Progress Notes (Signed)
[place] at [facility]                                                                                                                                                                                  Patient Demographics   Delmer Kowalski, is a 57 y.o. male, DOB - [birth year], JTT:[id number]  Admit date - 05/16/2018   Admitting Physician Vaughan Basta, MD  Outpatient Primary MD for the patient is Patient, No Pcp Per   LOS - 2  Subjective:  Currently denying any symptoms awaiting dialysis spot   Review of Systems:   CONSTITUTIONAL: No documented fever. No fatigue, weakness. No weight gain, no weight loss.  EYES: No blurry or double vision.  ENT: No tinnitus. No postnasal drip. No redness of the oropharynx.  RESPIRATORY: No cough, no wheeze, no hemoptysis. No dyspnea.  CARDIOVASCULAR: No chest pain. No orthopnea. No palpitations. No syncope.  GASTROINTESTINAL: No nausea, no vomiting or diarrhea. No abdominal pain. No melena or hematochezia.  GENITOURINARY: No dysuria or hematuria.  ENDOCRINE: No polyuria or nocturia. No heat or cold intolerance.  HEMATOLOGY: No anemia. No bruising. No bleeding.  INTEGUMENTARY: No rashes. No lesions.  MUSCULOSKELETAL: No arthritis. No swelling. No gout.  NEUROLOGIC: No numbness, tingling, or ataxia. No seizure-type activity.  PSYCHIATRIC: No anxiety. No insomnia. No ADD.    Vitals:   Vitals:   05/17/18 0738 05/17/18 1621 05/17/18 2326 05/18/18 0808  BP: (!) 145/73 119/63 133/62 140/72  Pulse: 73 67 72 68  Resp:   15 18  Temp: 98.5 F (36.9 C) 98.2 F (36.8 C) 98.7 F (37.1 C) (!) 97.5 F (36.4 C)  TempSrc: Oral Oral Oral Oral  SpO2: 97% 99% 99% 97%  Weight:      Height:        Wt Readings from Last 3 Encounters:  05/16/18 62.7 kg     Intake/Output Summary (Last 24 hours) at 05/18/2018 1335 Last data filed at 05/18/2018 1017 Gross per 24 hour  Intake 480 ml  Output -  Net 480 ml    Physical Exam:    GENERAL: Pleasant-appearing in no apparent distress.  HEAD, EYES, EARS, NOSE AND THROAT: Atraumatic, normocephalic. Extraocular muscles are intact. Pupils equal and reactive to light. Sclerae anicteric. No conjunctival injection. No oro-pharyngeal erythema.  NECK: Supple. There is no jugular venous distention. No bruits, no lymphadenopathy, no thyromegaly.  HEART: Regular rate and rhythm,. No murmurs, no rubs, no clicks.  LUNGS: Clear to auscultation bilaterally. No rales or rhonchi. No wheezes.  ABDOMEN: Soft, flat, nontender, nondistended. Has good bowel sounds. No hepatosplenomegaly appreciated.  EXTREMITIES: No evidence of any cyanosis, clubbing, or peripheral edema.  +  2 pedal and radial pulses bilaterally.  NEUROLOGIC: The patient is alert, awake, and oriented x3 with no focal motor or sensory deficits appreciated bilaterally.  SKIN: Moist and warm with no rashes appreciated.  Psych: Not anxious, depressed LN: No inguinal LN enlargement    Antibiotics   Anti-infectives (From admission, onward)   None      Medications   Scheduled Meds: . amLODipine  10 mg Oral Daily  . Chlorhexidine Gluconate Cloth  6 each Topical Q0600  . feeding supplement (NEPRO CARB STEADY)  237 mL Oral BID BM  . heparin  5,000 Units Subcutaneous Q8H  . metoprolol tartrate  25 mg Oral Daily  . multivitamin  1 tablet Oral QHS  . sevelamer carbonate  1,600 mg Oral TID WC  . tuberculin  5 Units Intradermal Once  . vitamin C  250 mg Oral BID   Continuous Infusions: PRN Meds:.docusate sodium, gabapentin   Data Review:   Micro Results No results found for this or any previous visit (from the past 240 hour(s)).  Radiology Reports Dg Chest 2 View  Result Date: 05/16/2018 CLINICAL DATA:  Weakness. Shortness of breath. Last dialysis treatment was 5 days ago. EXAM: CHEST - 2 VIEW COMPARISON:  None. FINDINGS: Heart is enlarged. Mild pulmonary vascular congestion is present. No significant airspace  consolidation is present. No effusions are present. Left subclavian stent is noted. The visualized soft tissues and bony thorax are otherwise unremarkable. IMPRESSION: 1. Cardiomegaly and mild pulmonary vascular congestion likely reflects early congestive heart failure. 2. No significant airspace consolidation to suggest infection. Electronically Signed   By: San Morelle M.D.   On: 05/16/2018 12:53     CBC Recent Labs  Lab 05/16/18 1210 05/17/18 0254  WBC 5.6 4.0  HGB 9.3* 9.0*  HCT 28.2* 26.2*  PLT 248 209  MCV 84.6 83.1  MCH 28.0 28.6  MCHC 33.1 34.4  RDW 20.2* 19.9*    Chemistries  Recent Labs  Lab 05/16/18 1210 05/17/18 0254  NA 141 141  K 6.4* 4.5  CL 105 103  CO2 17* 24  GLUCOSE 141* 101*  BUN 127* 88*  CREATININE 20.71* 14.35*  CALCIUM 8.2* 7.7*   ------------------------------------------------------------------------------------------------------------------ estimated creatinine clearance is 5 mL/min (A) (by C-G formula based on SCr of 14.35 mg/dL (H)). ------------------------------------------------------------------------------------------------------------------ No results for input(s): HGBA1C in the last 72 hours. ------------------------------------------------------------------------------------------------------------------ No results for input(s): CHOL, HDL, LDLCALC, TRIG, CHOLHDL, LDLDIRECT in the last 72 hours. ------------------------------------------------------------------------------------------------------------------ No results for input(s): TSH, T4TOTAL, T3FREE, THYROIDAB in the last 72 hours.  Invalid input(s): FREET3 ------------------------------------------------------------------------------------------------------------------ Recent Labs    05/16/18 2032  FERRITIN 630*  TIBC 177*  IRON 66    Coagulation profile No results for input(s): INR, PROTIME in the last 168 hours.  No results for input(s): DDIMER in the last 72  hours.  Cardiac Enzymes No results for input(s): CKMB, TROPONINI, MYOGLOBIN in the last 168 hours.  Invalid input(s): CK ------------------------------------------------------------------------------------------------------------------ Invalid input(s): [place]  Patient is 57 year old on hemodialysis missed [place] hemodialysis [place]  *Hyperkalemia Now resolved  *End-stage renal disease on hemodialysis Patient will need outpatient spot in the dialysis center prior to discharge   *Hypertension-blood pressure stable Continue amlodipine and metoprolol  *Neuropathy Continue gabapentin patient on as needed this was his home regimen  *Active smoking  patient told to stop smoking      Code Status Orders  (From admission, onward)  Start     Ordered   05/16/18 1709  Full code  Continuous     05/16/18 1709        Code Status History    This patient has a current code status but no historical code status.           Consults nephrology  DVT Prophylaxis heparin  Lab Results  Component Value Date   PLT 209 05/17/2018     Time Spent in minutes   35 minutes greater than 50% of time spent in care coordination and counseling patient regarding the condition and plan of care.   Dustin Flock M.D on 05/18/2018 at 1:35 PM  Between 7am to 6pm - Pager - [phone]  After 6pm go to [email or web address] - Proofreader  [employer]   Office  [phone]

## 2018-05-18 NOTE — Plan of Care (Signed)
  Problem: Education: Goal: Knowledge of General Education information will improve Description Including pain rating scale, medication(s)/side effects and non-pharmacologic comfort measures Outcome: Progressing   

## 2018-05-18 NOTE — Progress Notes (Signed)
[facility]  ROUNDING NOTE   Subjective:  Patient seen at bedside.  Due for dialysis today. Outpt planning for dialysis ongoing.   Pt in good spirits.   Objective:  Vital signs in last 24 hours:  Temp:  [97.5 F (36.4 C)-98.7 F (37.1 C)] 97.5 F (36.4 C) (09/10 0808) Pulse Rate:  [67-72] 68 (09/10 0808) Resp:  [15-18] 18 (09/10 0808) BP: (119-140)/(62-72) 140/72 (09/10 0808) SpO2:  [97 %-99 %] 97 % (09/10 0808)  Weight change:  Filed Weights   05/16/18 1201 05/16/18 2317  Weight: 65.3 kg 62.7 kg    Intake/Output: I/O last 3 completed shifts: In: 600 [P.O.:600] Out: 0    Intake/Output this shift:  Total I/O In: 240 [P.O.:240] Out: -   Physical Exam: General: No acute distress  Head: Normocephalic, atraumatic. Moist oral mucosal membranes  Eyes: Anicteric  Neck: Supple, trachea midline  Lungs:  Clear to auscultation, normal effort  Heart: S1S2 no rubs  Abdomen:  Soft, nontender, bowel sounds present  Extremities:  peripheral edema.  Neurologic: Awake, alert, following commands  Skin: No lesions  Access: LUE AVF    Basic Metabolic Panel: Recent Labs  Lab 05/16/18 1210 05/16/18 2032 05/17/18 0254 05/18/18 1219  NA 141  --  141  --   K 6.4*  --  4.5  --   CL 105  --  103  --   CO2 17*  --  24  --   GLUCOSE 141*  --  101*  --   BUN 127*  --  88*  --   CREATININE 20.71*  --  14.35*  --   CALCIUM 8.2*  --  7.7*  --   PHOS  --  8.6*  --  9.0*    Liver Function Tests: No results for input(s): AST, ALT, ALKPHOS, BILITOT, PROT, ALBUMIN in the last 168 hours. No results for input(s): LIPASE, AMYLASE in the last 168 hours. No results for input(s): AMMONIA in the last 168 hours.  CBC: Recent Labs  Lab 05/16/18 1210 05/17/18 0254  WBC 5.6 4.0  HGB 9.3* 9.0*  HCT 28.2* 26.2*  MCV 84.6 83.1  PLT 248 209    Cardiac Enzymes: No results for input(s): CKTOTAL, CKMB, CKMBINDEX, TROPONINI in the last 168 hours.  BNP: Invalid input(s):  POCBNP  CBG: No results for input(s): GLUCAP in the last 168 hours.  Microbiology: No results found for this or any previous visit.  Coagulation Studies: No results for input(s): LABPROT, INR in the last 72 hours.  Urinalysis: No results for input(s): COLORURINE, LABSPEC, PHURINE, GLUCOSEU, HGBUR, BILIRUBINUR, KETONESUR, PROTEINUR, UROBILINOGEN, NITRITE, LEUKOCYTESUR in the last 72 hours.  Invalid input(s): APPERANCEUR    Imaging: No results found.   Medications:    . amLODipine  10 mg Oral Daily  . Chlorhexidine Gluconate Cloth  6 each Topical Q0600  . feeding supplement (NEPRO CARB STEADY)  237 mL Oral BID BM  . heparin  5,000 Units Subcutaneous Q8H  . metoprolol tartrate  25 mg Oral Daily  . multivitamin  1 tablet Oral QHS  . sevelamer carbonate  1,600 mg Oral TID WC  . tuberculin  5 Units Intradermal Once  . vitamin C  250 mg Oral BID   docusate sodium, gabapentin  Assessment/ Plan:  57 y.o. male with ESRD on HD since 2012, HTN, h/o DM now diet controlled, h/o CHF, Cardiac ablation, h/o AVF stent and angioplasty, was admitted on 05/16/2018 with hyperkalemia  No dialysis unit  1.  End-stage renal disease: Patient due for hemodialysis again today.  Outpatient dialysis planning pending and should be completed by tomorrow or Thursday.  2.  Anemia chronic kidney disease Plan to initiate Epogen as an outpatient.  3.  Secondary hyperparathyroidism Serum phosphorus has been high at 9.0.  We plan to recheck this tomorrow.  Otherwise continue Renvela.  4. hyperkalemia: Hyperkalemia corrected with dialysis.  Continue to monitor potassium periodically.   LOS: 2 Abella Shugart 9/10/20192:12 PM

## 2018-05-18 NOTE — Progress Notes (Signed)
Treatment completed. UF goal met. Patient tolerated well. Patient states he will "not run more than 3 hours because 3 hours is my treatment time. " All blood returned. Report called to primary RN. Will notify MD

## 2018-05-18 NOTE — Progress Notes (Signed)
Pre hd 

## 2018-05-19 LAB — MRSA PCR SCREENING: MRSA BY PCR: NEGATIVE

## 2018-05-19 LAB — HEPATITIS B SURFACE ANTIGEN: Hepatitis B Surface Ag: NEGATIVE

## 2018-05-19 LAB — HEPATITIS B SURFACE ANTIBODY,QUALITATIVE: Hep B S Ab: REACTIVE

## 2018-05-19 LAB — HEPATITIS B CORE ANTIBODY, TOTAL: Hep B Core Total Ab: NEGATIVE

## 2018-05-19 MED ORDER — RENA-VITE PO TABS: 1.0000 | ORAL_TABLET | Freq: Every day | ORAL | 1 refills | Status: AC

## 2018-05-19 MED ORDER — GABAPENTIN 300 MG PO CAPS: 300.0000 mg | ORAL_CAPSULE | Freq: Every day | ORAL | 1 refills | Status: DC | PRN

## 2018-05-19 MED ORDER — RAMELTEON 8 MG PO TABS
8.0000 mg | ORAL_TABLET | Freq: Every day | ORAL | Status: DC
  Filled 2018-05-19 (×2): qty 1

## 2018-05-19 NOTE — Progress Notes (Signed)
[facility]  ROUNDING NOTE   Subjective:  Patient seen at bedside. He is looking forward to dialysis as an outpatient. Tolerated dialysis well yesterday.  Objective:  Vital signs in last 24 hours:  Temp:  [97.7 F (36.5 C)-98.5 F (36.9 C)] 98.5 F (36.9 C) (09/11 0032) Pulse Rate:  [72-81] 81 (09/11 0032) Resp:  [15-20] 18 (09/11 0032) BP: (126-162)/(56-77) 151/75 (09/11 0032) SpO2:  [96 %-100 %] 96 % (09/11 0032) Weight:  [63.6 kg-65.3 kg] 63.6 kg (09/10 1845)  Weight change:  Filed Weights   05/16/18 2317 05/18/18 1518 05/18/18 1845  Weight: 62.7 kg 65.3 kg 63.6 kg    Intake/Output: I/O last 3 completed shifts: In: 480 [P.O.:480] Out: 1514 [GMWNU:[id number]]   Intake/Output this shift:  No intake/output data recorded.  Physical Exam: General: No acute distress  Head: Normocephalic, atraumatic. Moist oral mucosal membranes  Eyes: Anicteric  Neck: Supple, trachea midline  Lungs:  Clear to auscultation, normal effort  Heart: S1S2 no rubs  Abdomen:  Soft, nontender, bowel sounds present  Extremities: No peripheral edema.  Neurologic: Awake, alert, following commands  Skin: No lesions  Access: LUE AVF    Basic Metabolic Panel: Recent Labs  Lab 05/16/18 1210 05/16/18 2032 05/17/18 0254 05/18/18 1219  NA 141  --  141  --   K 6.4*  --  4.5  --   CL 105  --  103  --   CO2 17*  --  24  --   GLUCOSE 141*  --  101*  --   BUN 127*  --  88*  --   CREATININE 20.71*  --  14.35*  --   CALCIUM 8.2*  --  7.7*  --   PHOS  --  8.6*  --  9.0*    Liver Function Tests: No results for input(s): AST, ALT, ALKPHOS, BILITOT, PROT, ALBUMIN in the last 168 hours. No results for input(s): LIPASE, AMYLASE in the last 168 hours. No results for input(s): AMMONIA in the last 168 hours.  CBC: Recent Labs  Lab 05/16/18 1210 05/17/18 0254  WBC 5.6 4.0  HGB 9.3* 9.0*  HCT 28.2* 26.2*  MCV 84.6 83.1  PLT 248 209    Cardiac Enzymes: No results for input(s):  CKTOTAL, CKMB, CKMBINDEX, TROPONINI in the last 168 hours.  BNP: Invalid input(s): POCBNP  CBG: No results for input(s): GLUCAP in the last 168 hours.  Microbiology: Results for orders placed or performed during the hospital encounter of 05/16/18  MRSA PCR Screening     Status: None   Collection Time: 05/19/18  5:45 AM  Result Value Ref Range Status   MRSA by PCR NEGATIVE NEGATIVE Final    Comment:        The GeneXpert MRSA Assay (FDA approved for NASAL specimens only), is one component of a comprehensive MRSA colonization surveillance program. It is not intended to diagnose MRSA infection nor to guide or monitor treatment for MRSA infections. Performed at [facility], [address] [place], [place] [address]     Coagulation Studies: No results for input(s): LABPROT, INR in the last 72 hours.  Urinalysis: No results for input(s): COLORURINE, LABSPEC, PHURINE, GLUCOSEU, HGBUR, BILIRUBINUR, KETONESUR, PROTEINUR, UROBILINOGEN, NITRITE, LEUKOCYTESUR in the last 72 hours.  Invalid input(s): APPERANCEUR    Imaging: No results found.   Medications:    . amLODipine  10 mg Oral Daily  . Chlorhexidine Gluconate Cloth  6 each Topical Q0600  . feeding supplement (NEPRO CARB STEADY)  237 mL Oral BID BM  . heparin  5,000 Units Subcutaneous Q8H  . metoprolol tartrate  25 mg Oral Daily  . multivitamin  1 tablet Oral QHS  . ramelteon  8 mg Oral QHS  . sevelamer carbonate  1,600 mg Oral TID WC  . tuberculin  5 Units Intradermal Once  . vitamin C  250 mg Oral BID   docusate sodium, gabapentin  Assessment/ Plan:  57 y.o. male with ESRD on HD since 2012, HTN, h/o DM now diet controlled, h/o CHF, Cardiac ablation, h/o AVF stent and angioplasty, was admitted on 05/16/2018 with hyperkalemia  No dialysis unit  1.  End-stage renal disease: No urgent indication for dialysis today.  Outpatient dialysis  2.  Anemia chronic kidney disease Plan to initiate Epogen as  an outpatient.  3.  Secondary hyperparathyroidism Recheck serum phosphorus as an outpatient as it was high at 9.0.  Continue the patient on Renvela for now.  4. hyperkalemia: Patient will be on a 2K potassium bath as an outpatient.   LOS: 3 Virlee Stroschein 9/11/201910:08 AM

## 2018-05-19 NOTE — Plan of Care (Signed)
  Problem: Education: Goal: Knowledge of General Education information will improve Description Including pain rating scale, medication(s)/side effects and non-pharmacologic comfort measures Outcome: Progressing   

## 2018-05-19 NOTE — Progress Notes (Signed)
DISCHARGE NOTE:  Pt given discharge instructions and scripts. Pt verbalized understanding. Pt wheeled to car.

## 2018-05-19 NOTE — Progress Notes (Signed)
Per RN patient stated he is not going to the shelter and will discharge today. Clinical Education officer, museum ([place]) left [employer] a voicemail making him aware of above.   [employer], LCSW [phone]

## 2018-05-19 NOTE — Care Management Important Message (Signed)
Important Message  Patient Details  Name: Jeremy [name] MRN: [id number] Date of Birth: Nov 27, 1960   Medicare Important Message Given:  Yes    Juliann Pulse A Justyn Langham 05/19/2018, 10:44 AM

## 2018-05-20 NOTE — Discharge Summary (Signed)
[place] at [place] NAME: Jeremy [name]    MR#:  [id number]  DATE OF BIRTH:  [birth year]  DATE OF ADMISSION:  05/16/2018 ADMITTING PHYSICIAN: Vaughan Basta, MD  DATE OF DISCHARGE: 05/19/2018 11:33 AM  PRIMARY CARE PHYSICIAN: Patient, No Pcp Per    ADMISSION DIAGNOSIS:  End stage renal disease ([place]) [N18.6] Hyperkalemia [E87.5]  DISCHARGE DIAGNOSIS:  Principal Problem:   Hyperkalemia   SECONDARY DIAGNOSIS:   Past Medical History:  Diagnosis Date  . Hypertension   . Renal disorder    kidney failure, dialysis    HOSPITAL COURSE:   57 year old male with past medical history of end-stage renal disease on hemodialysis, hypertension, secondary hyperparathyroidism, apathy who presented to the hospital due to hyperkalemia.  1.  Hyperkalemia-secondary to patient's end-stage renal disease on hemodialysis.  Patient had recently moved from [facility] but has not established nephrology or dialysis here in the local area and therefore had missed dialysis for prolonged period of time and presented with hyperkalemia.  Patient received hemodialysis while in the hospital and patient's potassium has now normalized.  2.  End-stage renal disease on hemodialysis-patient was seen by nephrology and maintained on dialysis Tuesday Thursday Saturday schedule.  Nephrology helped arrange outpatient spot for hemodialysis and therefore he is now being discharged.  3.  Essential hypertension-patient will continue his Norvasc, Metoprolol  4. Neuropathy - pt. Will cont. Gabapentin.   5. Secondary Hyperparathyrodism - cont. Renvela.   DISCHARGE CONDITIONS:   Stable.   CONSULTS OBTAINED:  Treatment Team:  Murlean Iba, MD Henreitta Leber, MD  DRUG ALLERGIES:  No Known Allergies  DISCHARGE MEDICATIONS:   Allergies as of 05/19/2018   No Known Allergies     Medication List    TAKE these medications   amLODipine 10 MG tablet Commonly known as:   NORVASC Take 10 mg by mouth daily.   gabapentin 300 MG capsule Commonly known as:  NEURONTIN Take 1 capsule (300 mg total) by mouth daily as needed.   metoprolol tartrate 25 MG tablet Commonly known as:  LOPRESSOR Take 25 mg by mouth daily.   multivitamin Tabs tablet Take 1 tablet by mouth at bedtime.   sevelamer carbonate 800 MG tablet Commonly known as:  RENVELA Take 1,600 mg by mouth 3 (three) times daily with meals.         DISCHARGE INSTRUCTIONS:   DIET:  Cardiac diet and Renal diet  DISCHARGE CONDITION:  Stable  ACTIVITY:  Activity as tolerated  OXYGEN:  Home Oxygen: No.   Oxygen Delivery: room air  DISCHARGE LOCATION:  home   If you experience worsening of your admission symptoms, develop shortness of breath, life threatening emergency, suicidal or homicidal thoughts you must seek medical attention immediately by calling 911 or calling your MD immediately  if symptoms less severe.  You Must read complete instructions/literature along with all the possible adverse reactions/side effects for all the Medicines you take and that have been prescribed to you. Take any new Medicines after you have completely understood and accpet all the possible adverse reactions/side effects.   Please note  You were cared for by a hospitalist during your hospital stay. If you have any questions about your discharge medications or the care you received while you were in the hospital after you are discharged, you can call the unit and asked to speak with the hospitalist on call if the hospitalist that took care of you is not available. Once you are discharged, your  primary care physician will handle any further medical issues. Please note that NO REFILLS for any discharge medications will be authorized once you are discharged, as it is imperative that you return to your primary care physician (or establish a relationship with a primary care physician if you do not have one) for your  aftercare needs so that they can reassess your need for medications and monitor your lab values.     Today   Pt. Has no complaints.  He denies any shortness of breath, chest pain.   VITAL SIGNS:  Blood pressure (!) 153/68, pulse 72, temperature 98.5 F (36.9 C), temperature source Oral, resp. rate 18, height 5\' 6"  (1.676 m), weight 63.6 kg, SpO2 99 %.  I/O:  No intake or output data in the 24 hours ending 05/20/18 1558  PHYSICAL EXAMINATION:  GENERAL:  57 y.o.-year-old patient lying in the bed with no acute distress.  EYES: Pupils equal, round, reactive to light and accommodation. No scleral icterus. Extraocular muscles intact.  HEENT: Head atraumatic, normocephalic. Oropharynx and nasopharynx clear.  NECK:  Supple, no jugular venous distention. No thyroid enlargement, no tenderness.  LUNGS: Normal breath sounds bilaterally, no wheezing, rales,rhonchi. No use of accessory muscles of respiration.  CARDIOVASCULAR: S1, S2 normal. No murmurs, rubs, or gallops.  ABDOMEN: Soft, non-tender, non-distended. Bowel sounds present. No organomegaly or mass.  EXTREMITIES: No pedal edema, cyanosis, or clubbing.  NEUROLOGIC: Cranial nerves II through XII are intact. No focal motor or sensory defecits b/l.  PSYCHIATRIC: The patient is alert and oriented x 3. Good affect.  SKIN: No obvious rash, lesion, or ulcer.   Left upper ext. AV fistula with good bruit, thrill.   DATA REVIEW:   CBC Recent Labs  Lab 05/17/18 0254  WBC 4.0  HGB 9.0*  HCT 26.2*  PLT 209    Chemistries  Recent Labs  Lab 05/17/18 0254  NA 141  K 4.5  CL 103  CO2 24  GLUCOSE 101*  BUN 88*  CREATININE 14.35*  CALCIUM 7.7*    Cardiac Enzymes No results for input(s): TROPONINI in the last 168 hours.  Microbiology Results  Results for orders placed or performed during the hospital encounter of 05/16/18  MRSA PCR Screening     Status: None   Collection Time: 05/19/18  5:45 AM  Result Value Ref Range Status    MRSA by PCR NEGATIVE NEGATIVE Final    Comment:        The GeneXpert MRSA Assay (FDA approved for NASAL specimens only), is one component of a comprehensive MRSA colonization surveillance program. It is not intended to diagnose MRSA infection nor to guide or monitor treatment for MRSA infections. Performed at [facility], [address] [place], [place] [address]     RADIOLOGY:  No results found.    Management plans discussed with the patient, family and they are in agreement.  CODE STATUS:  Code Status History    Date Active Date Inactive Code Status Order ID Comments User Context   05/16/2018 1709 05/19/2018 1433 Full Code [id number]  Vaughan Basta, MD Inpatient      TOTAL TIME TAKING CARE OF THIS PATIENT: 40 minutes.    Henreitta Leber M.D on 05/20/2018 at 3:58 PM  Between 7am to 6pm - Pager - [phone]  After 6pm go to [email or web address] - Proofreader  [facility]  Office  [phone]  CC: Primary care physician; Patient, No Pcp Per

## 2018-07-18 ENCOUNTER — Emergency Department
Admission: EM | Admit: 2018-07-18 | Discharge: 2018-07-18 | Disposition: A | Discharge status: Home / Self Care | Payer: Medicare Other | Attending: Emergency Medicine | Admitting: Emergency Medicine

## 2018-07-18 DIAGNOSIS — N186 End stage renal disease: Secondary | ICD-10-CM | POA: Insufficient documentation

## 2018-07-18 DIAGNOSIS — E875 Hyperkalemia: Secondary | ICD-10-CM | POA: Diagnosis not present

## 2018-07-18 DIAGNOSIS — F17210 Nicotine dependence, cigarettes, uncomplicated: Secondary | ICD-10-CM | POA: Diagnosis not present

## 2018-07-18 DIAGNOSIS — Z992 Dependence on renal dialysis: Secondary | ICD-10-CM | POA: Diagnosis not present

## 2018-07-18 DIAGNOSIS — I120 Hypertensive chronic kidney disease with stage 5 chronic kidney disease or end stage renal disease: Secondary | ICD-10-CM | POA: Diagnosis not present

## 2018-07-18 DIAGNOSIS — N19 Unspecified kidney failure: Secondary | ICD-10-CM

## 2018-07-18 DIAGNOSIS — Z79899 Other long term (current) drug therapy: Secondary | ICD-10-CM | POA: Diagnosis not present

## 2018-07-18 DIAGNOSIS — Z9115 Patient's noncompliance with renal dialysis: Secondary | ICD-10-CM | POA: Insufficient documentation

## 2018-07-18 DIAGNOSIS — R251 Tremor, unspecified: Secondary | ICD-10-CM | POA: Diagnosis present

## 2018-07-18 LAB — CBC WITH DIFFERENTIAL/PLATELET
Abs Immature Granulocytes: 0.02 10*3/uL (ref 0.00–0.07)
BASOS ABS: 0.1 10*3/uL (ref 0.0–0.1)
Basophils Relative: 1 %
EOS PCT: 5 %
Eosinophils Absolute: 0.3 10*3/uL (ref 0.0–0.5)
HCT: 26.9 % — ABNORMAL LOW (ref 39.0–52.0)
HEMOGLOBIN: 8.6 g/dL — AB (ref 13.0–17.0)
Immature Granulocytes: 0 %
LYMPHS PCT: 15 %
Lymphs Abs: 0.8 10*3/uL (ref 0.7–4.0)
MCH: 30.6 pg (ref 26.0–34.0)
MCHC: 32 g/dL (ref 30.0–36.0)
MCV: 95.7 fL (ref 80.0–100.0)
MONO ABS: 0.4 10*3/uL (ref 0.1–1.0)
Monocytes Relative: 7 %
NRBC: 0 % (ref 0.0–0.2)
Neutro Abs: 3.9 10*3/uL (ref 1.7–7.7)
Neutrophils Relative %: 72 %
Platelets: 263 10*3/uL (ref 150–400)
RBC: 2.81 MIL/uL — AB (ref 4.22–5.81)
RDW: 17.4 % — AB (ref 11.5–15.5)
WBC: 5.5 10*3/uL (ref 4.0–10.5)

## 2018-07-18 LAB — BASIC METABOLIC PANEL
Anion gap: 17 — ABNORMAL HIGH (ref 5–15)
BUN: 101 mg/dL — AB (ref 6–20)
CALCIUM: 8.8 mg/dL — AB (ref 8.9–10.3)
CHLORIDE: 99 mmol/L (ref 98–111)
CO2: 22 mmol/L (ref 22–32)
CREATININE: 13.95 mg/dL — AB (ref 0.61–1.24)
GFR calc Af Amer: 4 mL/min — ABNORMAL LOW (ref >60)
GFR calc non Af Amer: 3 mL/min — ABNORMAL LOW (ref >60)
Glucose, Bld: 97 mg/dL (ref 70–99)
Potassium: 6.8 mmol/L (ref 3.5–5.1)
SODIUM: 138 mmol/L (ref 135–145)

## 2018-07-18 LAB — POTASSIUM: POTASSIUM: 3.9 mmol/L (ref 3.5–5.1)

## 2018-07-18 MED ORDER — CALCIUM GLUCONATE 10 % IV SOLN
1.0000 g | Freq: Once | INTRAVENOUS | Status: AC
  Administered 2018-07-18: 1 g via INTRAVENOUS
  Filled 2018-07-18: qty 10

## 2018-07-18 MED ORDER — HEPARIN SODIUM (PORCINE) 1000 UNIT/ML DIALYSIS: 1000.0000 [IU] | INTRAMUSCULAR | Status: DC | PRN

## 2018-07-18 MED ORDER — CHLORHEXIDINE GLUCONATE CLOTH 2 % EX PADS: 6.0000 | MEDICATED_PAD | Freq: Every day | CUTANEOUS | Status: DC

## 2018-07-18 MED ORDER — SODIUM CHLORIDE 0.9 % IV SOLN: 100.0000 mL | INTRAVENOUS | Status: DC | PRN

## 2018-07-18 MED ORDER — PENTAFLUOROPROP-TETRAFLUOROETH EX AERO: 1.0000 "application " | INHALATION_SPRAY | CUTANEOUS | Status: DC | PRN

## 2018-07-18 MED ORDER — SODIUM BICARBONATE 8.4 % IV SOLN
100.0000 meq | Freq: Once | INTRAVENOUS | Status: AC
  Administered 2018-07-18: 100 meq via INTRAVENOUS
  Filled 2018-07-18: qty 100

## 2018-07-18 MED ORDER — HEPARIN SODIUM (PORCINE) 1000 UNIT/ML DIALYSIS: 20.0000 [IU]/kg | INTRAMUSCULAR | Status: DC | PRN

## 2018-07-18 MED ORDER — LIDOCAINE HCL (PF) 1 % IJ SOLN: 5.0000 mL | INTRAMUSCULAR | Status: DC | PRN

## 2018-07-18 MED ORDER — LIDOCAINE-PRILOCAINE 2.5-2.5 % EX CREA: 1.0000 "application " | TOPICAL_CREAM | CUTANEOUS | Status: DC | PRN

## 2018-07-18 NOTE — Discharge Instructions (Addendum)
Please resume your regular dialysis schedule.  Please return for any further problems.

## 2018-07-18 NOTE — ED Notes (Signed)
This RN received call from [facility] in diaylsis stating pt had taken a walk off unit and he wasn't sure where he went. Pt returned while on the phone.

## 2018-07-18 NOTE — ED Provider Notes (Signed)
[facility] Emergency Department Provider Note  ___________________________________________   First MD Initiated Contact with Patient 07/18/18 1012     (approximate)  I have reviewed the triage vital signs and the nursing notes.   HISTORY  Chief Complaint Vascular Access Problem   HPI Jeremy [name] is a 57 y.o. male of hypertension end-stage renal disease on dialysis was presented emergency department today saying that he feels jittery and is concerned that his "creatinine" may be elevated because he missed dialysis yesterday.  He says he missed dialysis yesterday because he started a new job with awkward hours.  He says that he feels similarly to how he feels when he misses dialysis.  Last dialysis this past Thursday.   Past Medical History:  Diagnosis Date  . Hypertension   . Renal disorder    kidney failure, dialysis    Patient Active Problem List   Diagnosis Date Noted  . Hyperkalemia 05/16/2018    Past Surgical History:  Procedure Laterality Date  . DIALYSIS FISTULA CREATION      Prior to Admission medications   Medication Sig Start Date End Date Taking? Authorizing Provider  amLODipine (NORVASC) 10 MG tablet Take 10 mg by mouth daily. 03/29/18   [provider]  gabapentin (NEURONTIN) 300 MG capsule Take 1 capsule (300 mg total) by mouth daily as needed. 05/19/18 07/18/18  Henreitta Leber, MD  metoprolol tartrate (LOPRESSOR) 25 MG tablet Take 25 mg by mouth daily. 03/29/18   [provider]  multivitamin (RENA-VIT) TABS tablet Take 1 tablet by mouth at bedtime. 05/19/18 07/18/18  Henreitta Leber, MD  sevelamer carbonate (RENVELA) 800 MG tablet Take 1,600 mg by mouth 3 (three) times daily with meals. 03/02/18   [provider]    Allergies Patient has no known allergies.  Family History  Problem Relation Age of Onset  . Hypertension Mother     Social History Social History   Tobacco Use  . Smoking status:  Current Every Day Smoker    Packs/day: 1.00    Types: Cigarettes  . Smokeless tobacco: Never Used  Substance Use Topics  . Alcohol use: Never    Frequency: Never  . Drug use: Not on file    Review of Systems  Constitutional: No fever/chills Eyes: No visual changes. ENT: No sore throat. Cardiovascular: Denies chest pain. Respiratory: Denies shortness of breath. Gastrointestinal: No abdominal pain.  No nausea, no vomiting.  No diarrhea.  No constipation. Genitourinary: Negative for dysuria. Musculoskeletal: Negative for back pain. Skin: Negative for rash. Neurological: Negative for headaches, focal weakness or numbness.   ____________________________________________   PHYSICAL EXAM:  VITAL SIGNS: ED Triage Vitals  Enc Vitals Group     BP 07/18/18 1012 (!) 178/93     Pulse Rate 07/18/18 1012 86     Resp 07/18/18 1012 14     Temp 07/18/18 1014 97.9 F (36.6 C)     Temp Source 07/18/18 1014 Oral     SpO2 07/18/18 1012 96 %     Weight 07/18/18 1012 144 lb 6.4 oz (65.5 kg)     Height --      Head Circumference --      Peak Flow --      Pain Score 07/18/18 1012 0     Pain Loc --      Pain Edu? --      Excl. in [place]? --     Constitutional: Alert and oriented. Well appearing and in no acute  distress. Eyes: Conjunctivae are normal.  Head: Atraumatic. Nose: No congestion/rhinnorhea. Mouth/Throat: Mucous membranes are moist.  Neck: No stridor.   Cardiovascular: Normal rate, regular rhythm. Grossly normal heart sounds.  Good peripheral circulation with palpable thrill to left upper externally dialysis fistula. Respiratory: Normal respiratory effort.  No retractions. Lungs CTAB. Gastrointestinal: Soft and nontender. No distention. Musculoskeletal: No lower extremity tenderness nor edema.  No joint effusions. Neurologic:  Normal speech and language. No gross focal neurologic deficits are appreciated. Skin:  Skin is warm, dry and intact. No rash noted. Psychiatric: Mood and  affect are normal. Speech and behavior are normal.  ____________________________________________   LABS (all labs ordered are listed, but only abnormal results are displayed)  Labs Reviewed  CBC WITH DIFFERENTIAL/PLATELET - Abnormal; Notable for the following components:      Result Value   RBC 2.81 (*)    Hemoglobin 8.6 (*)    HCT 26.9 (*)    RDW 17.4 (*)    All other components within normal limits  BASIC METABOLIC PANEL - Abnormal; Notable for the following components:   Potassium 6.8 (*)    BUN 101 (*)    Creatinine, Ser 13.95 (*)    Calcium 8.8 (*)    GFR calc non Af Amer 3 (*)    GFR calc Af Amer 4 (*)    Anion gap 17 (*)    All other components within normal limits   ____________________________________________  EKG  ED ECG REPORT I, Doran Stabler, the attending physician, personally viewed and interpreted this ECG.   Date: 07/18/2018  EKG Time: 1012  Rate: 84  Rhythm: normal sinus rhythm  Axis: Normal  Intervals:none  ST&T Change: No ST segment elevation or depression.  No abnormal T wave inversion.  Mildly peaked T waves in V4 and V5.  ____________________________________________  RADIOLOGY   ____________________________________________   PROCEDURES  Procedure(s) performed:   Procedures  Critical Care performed:   ____________________________________________   INITIAL IMPRESSION / ASSESSMENT AND PLAN / ED COURSE  Pertinent labs & imaging results that were available during my care of the patient were reviewed by me and considered in my medical decision making (see chart for details).  DDX: Uremia, hyperkalemia, dialysis noncompliance, anemia, substance abuse As part of my medical decision making, I reviewed the following data within the [place] Notes from prior ED visits  ----------------------------------------- 12:40 PM on 07/18/2018 -----------------------------------------  Patient with hyperkalemia as well as  uremia and elevated creatinine after missing dialysis yesterday.  Discussed case with Dr. Candiss Norse of nephrology who will be able to dialyze the patient.  Patient to return to the emergency department and be discharged in the emergency department.  Patient was aware of the diagnosis as well as treatment and willing to comply.  ____________________________________________   FINAL CLINICAL IMPRESSION(S) / ED DIAGNOSES  Dialysis noncompliance.  Hyperkalemia, uremia    NEW MEDICATIONS STARTED DURING THIS VISIT:  New Prescriptions   No medications on file     Note:  This document was prepared using Dragon voice recognition software and may include unintentional dictation errors.     Orbie Pyo, MD 07/18/18 [phone]

## 2018-07-18 NOTE — ED Notes (Signed)
Pt is transported to dialysis. And Elenore Paddy got report.

## 2018-07-18 NOTE — Progress Notes (Signed)
[place], [place] 07/18/18  Subjective:   Patient known to our practice from outpatient dialysis.  He presents to the emergency room not feeling well and feeling jittery.  He misses dialysis because of work.  He was asked to stay longer.  He states he called outpatient dialysis but it was too late (after 3:30 PM) Today in the emergency room, his potassium is critically elevated at 6.8.  BUN is critically elevated at 101.  No arrhythmias or peak T waves.  Patient did receive calcium gluconate and sodium bicarbonate in the emergency room Nephrology consult requested for emergency dialysis  Objective:  Vital signs in last 24 hours:  Temp:  [97.9 F (36.6 C)] 97.9 F (36.6 C) (11/10 1014) Pulse Rate:  [76-86] 76 (11/10 1115) Resp:  [14-23] 18 (11/10 1130) BP: (141-178)/(66-93) 141/66 (11/10 1100) SpO2:  [92 %-96 %] 92 % (11/10 1115) Weight:  [65.5 kg] 65.5 kg (11/10 1012)  Weight change:  Filed Weights   07/18/18 1012  Weight: 65.5 kg    Intake/Output:   No intake or output data in the 24 hours ending 07/18/18 1307   Physical Exam: General:  No acute distress, sitting up in the bed  HEENT  anicteric, moist oral mucous membranes  Neck  supple  Pulm/lungs  normal breathing effort on room air, clear to auscultation  CVS/Heart  regular rhythm  Abdomen:   Soft, nontender  Extremities:  No peripheral edema  Neurologic:  Alert, oriented  Skin:  No acute rashes  Access:  Left upper extremity AV fistula       Basic Metabolic Panel:  Recent Labs  Lab 07/18/18 1145  NA 138  K 6.8*  CL 99  CO2 22  GLUCOSE 97  BUN 101*  CREATININE 13.95*  CALCIUM 8.8*     CBC: Recent Labs  Lab 07/18/18 1145  WBC 5.5  NEUTROABS 3.9  HGB 8.6*  HCT 26.9*  MCV 95.7  PLT 263      Lab Results  Component Value Date   HEPBSAG Negative 05/18/2018   HEPBSAB Reactive 05/18/2018      Microbiology:  No results found for this or any previous visit  (from the past 240 hour(s)).  Coagulation Studies: No results for input(s): LABPROT, INR in the last 72 hours.  Urinalysis: No results for input(s): COLORURINE, LABSPEC, PHURINE, GLUCOSEU, HGBUR, BILIRUBINUR, KETONESUR, PROTEINUR, UROBILINOGEN, NITRITE, LEUKOCYTESUR in the last 72 hours.  Invalid input(s): APPERANCEUR    Imaging: No results found.   Medications:       Assessment/ Plan:  57 y.o. African-American male with ESRD on HD since 2012, HTN, h/o DM now diet controlled, h/o CHF, Cardiac ablation, h/o AVF stent and angioplasty,   [place] [facility]/TTS/65.5 kg/195 min (3:15)  1.  Severe hyperkalemia 2.  Uremia 3.  End-stage renal disease 4.  Anemia of chronic kidney disease  Plan: Urgent hemodialysis to correct potassium Dialysis nurse has been notified Patient wishes to be discharged after hemodialysis as he has to go to work tonight He promises to follow-up at his outpatient dialysis unit on Tuesday morning We discussed potentially trying home PD at home hemodialysis.  Patient wants to defer at this time. Requesting kidney transplant referral.  We will do that as outpatient Patient will be discharged from ER once dialysis is completed successfully    LOS: 0 Jeremy [name] 11/10/20191:07 PM  [place], [place]  Note: This note was prepared with Dragon dictation. Any transcription errors  are unintentional

## 2018-07-18 NOTE — ED Triage Notes (Signed)
Pt presents via POV requesting dialysis. Reports missing dialysis on Saturday due to work. 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 Denies pain.

## 2018-07-18 NOTE — Progress Notes (Signed)
Hd completed 

## 2018-07-18 NOTE — Progress Notes (Signed)
Hd started  

## 2018-08-16 ENCOUNTER — Ambulatory Visit: Payer: Medicare Other | Admitting: Family Medicine

## 2018-08-16 NOTE — Progress Notes (Deleted)
   Subjective:    Patient ID: Jeremy [name], male    DOB: 05-Jul-1961, 57 y.o.   MRN: [id number]  HPI   Presents to clinic to establish with PCP    Review of Systems  Constitutional: Negative for chills, fatigue and fever.  HENT: Negative for congestion, ear pain, sinus pain and sore throat.   Eyes: Negative.   Respiratory: Negative for cough, shortness of breath and wheezing.   Cardiovascular: Negative for chest pain, palpitations and leg swelling.  Gastrointestinal: Negative for abdominal pain, diarrhea, nausea and vomiting.  Genitourinary: Negative for dysuria, frequency and urgency.  Musculoskeletal: Negative for arthralgias and myalgias.  Skin: Negative for color change, pallor and rash.  Neurological: Negative for syncope, light-headedness and headaches.  Psychiatric/Behavioral: The patient is not nervous/anxious.       Objective:   Physical Exam        Assessment & Plan:

## 2018-08-19 ENCOUNTER — Ambulatory Visit: Payer: Medicare Other | Admitting: Family Medicine

## 2018-08-19 NOTE — Progress Notes (Deleted)
   Subjective:    Patient ID: Jeremy [name], male    DOB: [birth year], 57 y.o.   MRN: [id number]  HPI  Presents to clinic to establish with pcp  Patient Active Problem List   Diagnosis Date Noted  . Hyperkalemia 05/16/2018   Past Medical History:  Diagnosis Date  . Hypertension   . Renal disorder    kidney failure, dialysis   Past Surgical History:  Procedure Laterality Date  . DIALYSIS FISTULA CREATION     Family History  Problem Relation Age of Onset  . Hypertension Mother     Review of Systems   Constitutional: Negative for chills, fatigue and fever.  HENT: Negative for congestion, ear pain, sinus pain and sore throat.   Eyes: Negative.   Respiratory: Negative for cough, shortness of breath and wheezing.   Cardiovascular: Negative for chest pain, palpitations and leg swelling.  Gastrointestinal: Negative for abdominal pain, diarrhea, nausea and vomiting.  Genitourinary: Negative for dysuria, frequency and urgency.  Musculoskeletal: Negative for arthralgias and myalgias.  Skin: Negative for color change, pallor and rash.  Neurological: Negative for syncope, light-headedness and headaches.  Psychiatric/Behavioral: The patient is not nervous/anxious.       Objective:   Physical Exam        Assessment & Plan:

## 2018-09-18 ENCOUNTER — Other Ambulatory Visit: Payer: Self-pay

## 2018-09-18 ENCOUNTER — Encounter: Payer: Self-pay | Admitting: Emergency Medicine

## 2018-09-18 ENCOUNTER — Emergency Department: Payer: Medicare Other

## 2018-09-18 ENCOUNTER — Emergency Department
Admission: EM | Admit: 2018-09-18 | Discharge: 2018-09-18 | Disposition: A | Discharge status: Home / Self Care | Payer: Medicare Other | Attending: Emergency Medicine | Admitting: Emergency Medicine

## 2018-09-18 DIAGNOSIS — M25552 Pain in left hip: Secondary | ICD-10-CM | POA: Diagnosis not present

## 2018-09-18 DIAGNOSIS — Z992 Dependence on renal dialysis: Secondary | ICD-10-CM | POA: Diagnosis not present

## 2018-09-18 DIAGNOSIS — N186 End stage renal disease: Secondary | ICD-10-CM | POA: Diagnosis not present

## 2018-09-18 DIAGNOSIS — R059 Cough, unspecified: Secondary | ICD-10-CM

## 2018-09-18 DIAGNOSIS — J069 Acute upper respiratory infection, unspecified: Secondary | ICD-10-CM | POA: Insufficient documentation

## 2018-09-18 DIAGNOSIS — I509 Heart failure, unspecified: Secondary | ICD-10-CM | POA: Insufficient documentation

## 2018-09-18 DIAGNOSIS — R05 Cough: Secondary | ICD-10-CM | POA: Diagnosis present

## 2018-09-18 DIAGNOSIS — Z79899 Other long term (current) drug therapy: Secondary | ICD-10-CM | POA: Insufficient documentation

## 2018-09-18 DIAGNOSIS — I132 Hypertensive heart and chronic kidney disease with heart failure and with stage 5 chronic kidney disease, or end stage renal disease: Secondary | ICD-10-CM | POA: Diagnosis not present

## 2018-09-18 DIAGNOSIS — F17210 Nicotine dependence, cigarettes, uncomplicated: Secondary | ICD-10-CM | POA: Insufficient documentation

## 2018-09-18 LAB — INFLUENZA PANEL BY PCR (TYPE A & B)
Influenza A By PCR: NEGATIVE
Influenza B By PCR: NEGATIVE

## 2018-09-18 MED ORDER — AZITHROMYCIN 500 MG PO TABS
500.0000 mg | ORAL_TABLET | Freq: Once | ORAL | Status: AC
  Administered 2018-09-18: 500 mg via ORAL
  Filled 2018-09-18: qty 1

## 2018-09-18 MED ORDER — AZITHROMYCIN 250 MG PO TABS: ORAL_TABLET | ORAL | 0 refills | Status: DC

## 2018-09-18 MED ORDER — HYDROCOD POLST-CPM POLST ER 10-8 MG/5ML PO SUER: 5.0000 mL | Freq: Two times a day (BID) | ORAL | 0 refills | Status: DC | PRN

## 2018-09-18 MED ORDER — HYDROCOD POLST-CPM POLST ER 10-8 MG/5ML PO SUER
5.0000 mL | Freq: Once | ORAL | Status: AC
  Administered 2018-09-18: 5 mL via ORAL
  Filled 2018-09-18: qty 5

## 2018-09-18 MED ORDER — PREDNISONE 20 MG PO TABS: 40.0000 mg | ORAL_TABLET | Freq: Every day | ORAL | 0 refills | Status: DC

## 2018-09-18 MED ORDER — ACETAMINOPHEN 500 MG PO TABS
1000.0000 mg | ORAL_TABLET | Freq: Once | ORAL | Status: AC
  Administered 2018-09-18: 1000 mg via ORAL
  Filled 2018-09-18: qty 2

## 2018-09-18 MED ORDER — DEXAMETHASONE SODIUM PHOSPHATE 10 MG/ML IJ SOLN
10.0000 mg | Freq: Once | INTRAMUSCULAR | Status: AC
  Administered 2018-09-18: 10 mg via INTRAMUSCULAR
  Filled 2018-09-18: qty 1

## 2018-09-18 NOTE — ED Provider Notes (Signed)
[facility] EMERGENCY DEPARTMENT Provider Note   CSN: [id number] Arrival date & time: 09/18/18  1842     History   Chief Complaint Chief Complaint  Patient presents with  . Cough    HPI Jeremy [name] is a 58 y.o. male.  Presents to the emergency department for evaluation of cough, nasal congestion and left hip pain.  Patient has a history of congestive heart failure, hypertension, end-stage renal disease is on dialysis 3 days a week.  Patient states he has had cough for 3 days with no production.  He denies any fevers and has not been take any medications for symptoms.  Family members at home with had similar symptoms.  He denies any sore throat, nausea vomiting or diarrhea but has had a dry nonproductive cough with runny nose and nasal congestion.  He states every time he coughs he has left hip pain, points to his groin but also states the pain will go down the back of his thigh.  He denies any falls trauma or injury.  No swelling of the lower extremities.  No history of blood clots or PEs.  HPI  Past Medical History:  Diagnosis Date  . Hypertension   . Renal disorder    kidney failure, dialysis    Patient Active Problem List   Diagnosis Date Noted  . Hyperkalemia 05/16/2018    Past Surgical History:  Procedure Laterality Date  . DIALYSIS FISTULA CREATION          Home Medications    Prior to Admission medications   Medication Sig Start Date End Date Taking? Authorizing Provider  amLODipine (NORVASC) 10 MG tablet Take 10 mg by mouth daily. 03/29/18   [provider]  azithromycin (ZITHROMAX Z-PAK) 250 MG tablet Take 2 tablets (500 mg) on  Day 1,  followed by 1 tablet (250 mg) once daily on Days 2 through 5. 09/18/18   Duanne Guess, PA-C  chlorpheniramine-HYDROcodone (TUSSIONEX PENNKINETIC ER) 10-8 MG/5ML SUER Take 5 mLs by mouth every 12 (twelve) hours as needed for cough. 09/18/18   Duanne Guess, PA-C  gabapentin (NEURONTIN) 300 MG  capsule Take 1 capsule (300 mg total) by mouth daily as needed. 05/19/18 07/18/18  Henreitta Leber, MD  metoprolol tartrate (LOPRESSOR) 25 MG tablet Take 25 mg by mouth daily. 03/29/18   [provider]  predniSONE (DELTASONE) 20 MG tablet Take 2 tablets (40 mg total) by mouth daily. 09/18/18   Duanne Guess, PA-C  sevelamer carbonate (RENVELA) 800 MG tablet Take 1,600 mg by mouth 3 (three) times daily with meals. 03/02/18   [provider]    Family History Family History  Problem Relation Age of Onset  . Hypertension Mother     Social History Social History   Tobacco Use  . Smoking status: Current Every Day Smoker    Packs/day: 1.00    Types: Cigarettes  . Smokeless tobacco: Never Used  Substance Use Topics  . Alcohol use: Never    Frequency: Never  . Drug use: Not on file     Allergies   Patient has no known allergies.   Review of Systems Review of Systems  Constitutional: Positive for fever.  HENT: Positive for congestion and rhinorrhea. Negative for sinus pressure, sinus pain, sore throat and trouble swallowing.   Respiratory: Positive for cough. Negative for wheezing and stridor.   Gastrointestinal: Negative for diarrhea, nausea and vomiting.  Musculoskeletal: Positive for arthralgias and myalgias. Negative for neck stiffness.  Skin: Negative for rash.  Neurological: Negative for dizziness.     Physical Exam Updated Vital Signs BP (!) 118/59 (BP Location: Right Arm)   Pulse 88   Temp 99.1 F (37.3 C) (Oral)   Resp 16   Ht 5\' 6"  (1.676 m)   Wt 69.9 kg   SpO2 94%   BMI 24.86 kg/m   Physical Exam Constitutional:      General: He is not in acute distress.    Appearance: He is well-developed.  HENT:     Head: Normocephalic and atraumatic.     Jaw: No trismus.     Right Ear: Hearing, tympanic membrane, ear canal and external ear normal.     Left Ear: Hearing, tympanic membrane, ear canal and external ear normal.     Nose: Rhinorrhea  present. No congestion.     Right Sinus: No maxillary sinus tenderness or frontal sinus tenderness.     Left Sinus: No maxillary sinus tenderness or frontal sinus tenderness.     Mouth/Throat:     Pharynx: No oropharyngeal exudate, posterior oropharyngeal erythema or uvula swelling.     Tonsils: No tonsillar abscesses.  Eyes:     Conjunctiva/sclera: Conjunctivae normal.  Neck:     Musculoskeletal: Normal range of motion.  Cardiovascular:     Rate and Rhythm: Normal rate and regular rhythm.  Pulmonary:     Effort: No respiratory distress.     Breath sounds: No stridor. No wheezing.  Chest:     Chest wall: No tenderness.  Abdominal:     General: There is no distension.     Palpations: Abdomen is soft.     Tenderness: There is no abdominal tenderness. There is no guarding.  Musculoskeletal: Normal range of motion.     Comments: Left hip shows patient is nontender along the trochanteric bursa.  Has painful left hip internal rotation.  Is nontender along sciatic notch, sacrum or lumbar spinous process.  Sensation is intact throughout left lower extremity.  No edema in the lower extremities.  Negative Homans sign bilaterally.  Skin:    General: Skin is warm and dry.     Findings: No rash.  Neurological:     Mental Status: He is alert and oriented to person, place, and time.  Psychiatric:        Behavior: Behavior normal.        Thought Content: Thought content normal.        Judgment: Judgment normal.      ED Treatments / Results  Labs (all labs ordered are listed, but only abnormal results are displayed) Labs Reviewed  INFLUENZA PANEL BY PCR (TYPE A & B)    EKG None  Radiology Dg Chest 2 View  Result Date: 09/18/2018 CLINICAL DATA:  Cough and fever for 3 days. Left hip pain. EXAM: CHEST - 2 VIEW COMPARISON:  05/16/2018 FINDINGS: Cardiac enlargement. Mild perihilar infiltration, increased since previous study, likely edema. No focal consolidation. No blunting of  costophrenic angles. No pneumothorax. Calcification of the aorta. Vascular graft in the left axilla. IMPRESSION: Cardiac enlargement with mild perihilar edema. Electronically Signed   By: Lucienne Capers M.D.   On: 09/18/2018 20:51   Dg Hip Unilat W Or Wo Pelvis 2-3 Views Left  Result Date: 09/18/2018 CLINICAL DATA:  Left hip pain. No reported injury. EXAM: DG HIP (WITH OR WITHOUT PELVIS) 2-3V LEFT COMPARISON:  None. FINDINGS: There is no evidence of hip fracture or dislocation. There is no evidence of arthropathy or  other focal bone abnormality. Vascular calcifications. IMPRESSION: No acute bony abnormalities. Electronically Signed   By: Lucienne Capers M.D.   On: 09/18/2018 20:53    Procedures Procedures (including critical care time)  Medications Ordered in ED Medications  acetaminophen (TYLENOL) tablet 1,000 mg (1,000 mg Oral Given 09/18/18 2138)  chlorpheniramine-HYDROcodone (TUSSIONEX) 10-8 MG/5ML suspension 5 mL (5 mLs Oral Given 09/18/18 2138)  dexamethasone (DECADRON) injection 10 mg (10 mg Intramuscular Given 09/18/18 2139)  azithromycin (ZITHROMAX) tablet 500 mg (500 mg Oral Given 09/18/18 2143)     Initial Impression / Assessment and Plan / ED Course  I have reviewed the triage vital signs and the nursing notes.  Pertinent labs & imaging results that were available during my care of the patient were reviewed by me and considered in my medical decision making (see chart for details).     58 year old male with 3-day history of cough and left hip pain.  Vital signs stable and at baseline, low-grade temp of 99.1.  Oxygen saturations at 94% which is patient's baseline after reviewing previous notes.  No sign of DVT on exam, low probability for PE.  Physical exam and history consistent with viral illness.  Chest x-ray obtained showing perihilar infiltrate, likely to be edema but no blunting of the costophrenic angles.  Patient started on antibiotic due to comorbidities and given  prednisone and cough medication.  Left hip pain evaluated and likely due to a sciatica.  Hip shows no significant degenerative changes or arthropathy.  Prednisone should help alleviate left hip pain.  Patient is ambulatory with no neurological deficits.  He understands signs symptoms return to the ED for.  Final Clinical Impressions(s) / ED Diagnoses   Final diagnoses:  Cough  Left hip pain  Upper respiratory tract infection, unspecified type    ED Discharge Orders         Ordered    predniSONE (DELTASONE) 20 MG tablet  Daily     09/18/18 2232    azithromycin (ZITHROMAX Z-PAK) 250 MG tablet     09/18/18 2232    chlorpheniramine-HYDROcodone (TUSSIONEX PENNKINETIC ER) 10-8 MG/5ML SUER  Every 12 hours PRN     09/18/18 2232           Renata Caprice 09/18/18 2236    Carrie Mew, MD 09/20/18 0000

## 2018-09-18 NOTE — ED Triage Notes (Signed)
Cough x 3 days.  States others in the home have had similar symptoms recently.

## 2018-09-18 NOTE — Discharge Instructions (Addendum)
Please take medications as prescribed and follow-up PCP in 3 to 4 days for recheck.  Return to the ER for any worsening cough, fevers, shortness of breath, swelling

## 2018-09-18 NOTE — ED Notes (Signed)
No peripheral IV placed this visit.    Discharge instructions reviewed with patient. Questions fielded by this RN. Patient verbalizes understanding of instructions. Patient discharged home in stable condition per [place], [place]. No acute distress noted at time of discharge.

## 2018-09-18 NOTE — ED Notes (Addendum)
Pt reports sore throat with cough for the last 3 days and pain when swallowing, "pink eye" yesterday, chills but denies other s/sx

## 2018-09-22 ENCOUNTER — Observation Stay
Admission: EM | Admit: 2018-09-22 | Discharge: 2018-09-23 | Disposition: A | Discharge status: Home / Self Care | Payer: Medicare Other | Attending: Internal Medicine | Admitting: Internal Medicine

## 2018-09-22 ENCOUNTER — Observation Stay
Admit: 2018-09-22 | Discharge: 2018-09-22 | Disposition: A | Discharge status: Home / Self Care | Payer: Medicare Other | Attending: Internal Medicine | Admitting: Internal Medicine

## 2018-09-22 ENCOUNTER — Emergency Department: Payer: Medicare Other

## 2018-09-22 ENCOUNTER — Other Ambulatory Visit: Payer: Self-pay

## 2018-09-22 DIAGNOSIS — Z992 Dependence on renal dialysis: Secondary | ICD-10-CM | POA: Diagnosis not present

## 2018-09-22 DIAGNOSIS — R7989 Other specified abnormal findings of blood chemistry: Secondary | ICD-10-CM | POA: Insufficient documentation

## 2018-09-22 DIAGNOSIS — I509 Heart failure, unspecified: Secondary | ICD-10-CM | POA: Diagnosis not present

## 2018-09-22 DIAGNOSIS — J209 Acute bronchitis, unspecified: Principal | ICD-10-CM | POA: Insufficient documentation

## 2018-09-22 DIAGNOSIS — R778 Other specified abnormalities of plasma proteins: Secondary | ICD-10-CM

## 2018-09-22 DIAGNOSIS — I132 Hypertensive heart and chronic kidney disease with heart failure and with stage 5 chronic kidney disease, or end stage renal disease: Secondary | ICD-10-CM | POA: Insufficient documentation

## 2018-09-22 DIAGNOSIS — Z79899 Other long term (current) drug therapy: Secondary | ICD-10-CM | POA: Insufficient documentation

## 2018-09-22 DIAGNOSIS — J811 Chronic pulmonary edema: Secondary | ICD-10-CM | POA: Insufficient documentation

## 2018-09-22 DIAGNOSIS — R05 Cough: Secondary | ICD-10-CM | POA: Diagnosis present

## 2018-09-22 DIAGNOSIS — R0602 Shortness of breath: Secondary | ICD-10-CM | POA: Diagnosis present

## 2018-09-22 DIAGNOSIS — F17210 Nicotine dependence, cigarettes, uncomplicated: Secondary | ICD-10-CM | POA: Insufficient documentation

## 2018-09-22 DIAGNOSIS — N186 End stage renal disease: Secondary | ICD-10-CM | POA: Diagnosis not present

## 2018-09-22 DIAGNOSIS — J40 Bronchitis, not specified as acute or chronic: Secondary | ICD-10-CM

## 2018-09-22 HISTORY — DX: Bronchitis, not specified as acute or chronic: J40

## 2018-09-22 LAB — COMPREHENSIVE METABOLIC PANEL
ALT: 11 U/L (ref 0–44)
AST: 15 U/L (ref 15–41)
Albumin: 3.3 g/dL — ABNORMAL LOW (ref 3.5–5.0)
Alkaline Phosphatase: 109 U/L (ref 38–126)
Anion gap: 14 (ref 5–15)
BUN: 71 mg/dL — ABNORMAL HIGH (ref 6–20)
CO2: 25 mmol/L (ref 22–32)
CREATININE: 10.77 mg/dL — AB (ref 0.61–1.24)
Calcium: 7.3 mg/dL — ABNORMAL LOW (ref 8.9–10.3)
Chloride: 97 mmol/L — ABNORMAL LOW (ref 98–111)
GFR calc Af Amer: 5 mL/min — ABNORMAL LOW (ref >60)
GFR calc non Af Amer: 5 mL/min — ABNORMAL LOW (ref >60)
Glucose, Bld: 197 mg/dL — ABNORMAL HIGH (ref 70–99)
Potassium: 4.7 mmol/L (ref 3.5–5.1)
Sodium: 136 mmol/L (ref 135–145)
Total Bilirubin: 0.7 mg/dL (ref 0.3–1.2)
Total Protein: 7 g/dL (ref 6.5–8.1)

## 2018-09-22 LAB — LACTIC ACID, PLASMA
Lactic Acid, Venous: 1 mmol/L (ref 0.5–1.9)
Lactic Acid, Venous: 1.2 mmol/L (ref 0.5–1.9)

## 2018-09-22 LAB — CBC WITH DIFFERENTIAL/PLATELET
ABS IMMATURE GRANULOCYTES: 0.07 10*3/uL (ref 0.00–0.07)
Basophils Absolute: 0 10*3/uL (ref 0.0–0.1)
Basophils Relative: 0 %
Eosinophils Absolute: 0 10*3/uL (ref 0.0–0.5)
Eosinophils Relative: 0 %
HCT: 32.4 % — ABNORMAL LOW (ref 39.0–52.0)
Hemoglobin: 10.1 g/dL — ABNORMAL LOW (ref 13.0–17.0)
IMMATURE GRANULOCYTES: 1 %
LYMPHS PCT: 10 %
Lymphs Abs: 0.7 10*3/uL (ref 0.7–4.0)
MCH: 30 pg (ref 26.0–34.0)
MCHC: 31.2 g/dL (ref 30.0–36.0)
MCV: 96.1 fL (ref 80.0–100.0)
Monocytes Absolute: 0.4 10*3/uL (ref 0.1–1.0)
Monocytes Relative: 5 %
NEUTROS PCT: 84 %
Neutro Abs: 5.6 10*3/uL (ref 1.7–7.7)
Platelets: 295 10*3/uL (ref 150–400)
RBC: 3.37 MIL/uL — ABNORMAL LOW (ref 4.22–5.81)
RDW: 15.6 % — ABNORMAL HIGH (ref 11.5–15.5)
WBC: 6.7 10*3/uL (ref 4.0–10.5)
nRBC: 0 % (ref 0.0–0.2)

## 2018-09-22 LAB — TROPONIN I
Troponin I: 0.03 ng/mL (ref <0.03)
Troponin I: 0.03 ng/mL (ref <0.03)

## 2018-09-22 LAB — BRAIN NATRIURETIC PEPTIDE: B Natriuretic Peptide: 1658 pg/mL — ABNORMAL HIGH (ref 0.0–100.0)

## 2018-09-22 MED ORDER — NICOTINE 21 MG/24HR TD PT24
21.0000 mg | MEDICATED_PATCH | Freq: Every day | TRANSDERMAL | Status: DC
  Administered 2018-09-22 – 2018-09-23 (×2): 21 mg via TRANSDERMAL
  Filled 2018-09-22 (×2): qty 1

## 2018-09-22 MED ORDER — ONDANSETRON HCL 4 MG PO TABS: 4.0000 mg | ORAL_TABLET | Freq: Four times a day (QID) | ORAL | Status: DC | PRN

## 2018-09-22 MED ORDER — METOPROLOL TARTRATE 25 MG PO TABS
25.0000 mg | ORAL_TABLET | Freq: Every day | ORAL | Status: DC
  Administered 2018-09-23: 15:00:00 25 mg via ORAL
  Filled 2018-09-22: qty 1

## 2018-09-22 MED ORDER — SENNOSIDES-DOCUSATE SODIUM 8.6-50 MG PO TABS: 1.0000 | ORAL_TABLET | Freq: Every evening | ORAL | Status: DC | PRN

## 2018-09-22 MED ORDER — SODIUM CHLORIDE 0.9% FLUSH
3.0000 mL | Freq: Two times a day (BID) | INTRAVENOUS | Status: DC
  Administered 2018-09-22: 3 mL via INTRAVENOUS

## 2018-09-22 MED ORDER — GUAIFENESIN-DM 100-10 MG/5ML PO SYRP
5.0000 mL | ORAL_SOLUTION | ORAL | Status: DC | PRN
  Administered 2018-09-23 (×2): 5 mL via ORAL
  Filled 2018-09-22 (×4): qty 5

## 2018-09-22 MED ORDER — SEVELAMER CARBONATE 800 MG PO TABS
1600.0000 mg | ORAL_TABLET | Freq: Three times a day (TID) | ORAL | Status: DC
  Filled 2018-09-22 (×3): qty 2

## 2018-09-22 MED ORDER — DOXYCYCLINE HYCLATE 100 MG PO TABS
100.0000 mg | ORAL_TABLET | Freq: Two times a day (BID) | ORAL | Status: DC
  Administered 2018-09-22 – 2018-09-23 (×2): 100 mg via ORAL
  Filled 2018-09-22 (×3): qty 1

## 2018-09-22 MED ORDER — PREDNISONE 20 MG PO TABS
40.0000 mg | ORAL_TABLET | Freq: Every day | ORAL | Status: DC
  Administered 2018-09-23: 40 mg via ORAL
  Filled 2018-09-22: qty 2

## 2018-09-22 MED ORDER — HYDROCOD POLST-CPM POLST ER 10-8 MG/5ML PO SUER
5.0000 mL | Freq: Two times a day (BID) | ORAL | Status: DC | PRN
  Administered 2018-09-22: 5 mL via ORAL
  Filled 2018-09-22: qty 5

## 2018-09-22 MED ORDER — SODIUM CHLORIDE 0.9% FLUSH: 3.0000 mL | INTRAVENOUS | Status: DC | PRN

## 2018-09-22 MED ORDER — ACETAMINOPHEN 325 MG PO TABS: 650.0000 mg | ORAL_TABLET | Freq: Four times a day (QID) | ORAL | Status: DC | PRN

## 2018-09-22 MED ORDER — AMLODIPINE BESYLATE 10 MG PO TABS
10.0000 mg | ORAL_TABLET | Freq: Every day | ORAL | Status: DC
  Administered 2018-09-23: 15:00:00 10 mg via ORAL
  Filled 2018-09-22: qty 1

## 2018-09-22 MED ORDER — GABAPENTIN 300 MG PO CAPS
300.0000 mg | ORAL_CAPSULE | Freq: Two times a day (BID) | ORAL | Status: DC
  Administered 2018-09-22: 300 mg via ORAL
  Filled 2018-09-22: qty 1

## 2018-09-22 MED ORDER — SODIUM CHLORIDE 0.9 % IV SOLN: 250.0000 mL | INTRAVENOUS | Status: DC | PRN

## 2018-09-22 MED ORDER — ACETAMINOPHEN 650 MG RE SUPP: 650.0000 mg | Freq: Four times a day (QID) | RECTAL | Status: DC | PRN

## 2018-09-22 MED ORDER — ONDANSETRON HCL 4 MG/2ML IJ SOLN: 4.0000 mg | Freq: Four times a day (QID) | INTRAMUSCULAR | Status: DC | PRN

## 2018-09-22 MED ORDER — HEPARIN SODIUM (PORCINE) 5000 UNIT/ML IJ SOLN
5000.0000 [IU] | Freq: Three times a day (TID) | INTRAMUSCULAR | Status: DC
  Filled 2018-09-22: qty 1

## 2018-09-22 MED ORDER — BENZONATATE 100 MG PO CAPS
100.0000 mg | ORAL_CAPSULE | Freq: Once | ORAL | Status: AC
  Administered 2018-09-22: 100 mg via ORAL
  Filled 2018-09-22: qty 1

## 2018-09-22 NOTE — ED Notes (Signed)
Patient transported to X-ray 

## 2018-09-22 NOTE — ED Notes (Signed)
Floor unable to take report at this time.

## 2018-09-22 NOTE — Progress Notes (Signed)
Advanced care plan. Purpose of the Encounter: CODE STATUS Parties in Attendance: Patient Patient's Decision Capacity: Good Subjective/Patient's story: Presented to emergency room for shortness of breath and cough Objective/Medical story Has acute on chronic bronchitis Patient also has active tobacco abuse Appears fluid overloaded and needs dialysis Goals of care determination:  Advance care directives goals of care and treatment plan discussed Patient wants everything done which includes CPR, intubation ventilator the need arises CODE STATUS: Full code Time spent discussing advanced care planning: 16 minutes

## 2018-09-22 NOTE — ED Notes (Signed)
Attempted to call report x2 and floor stated pt has not been approved yet.

## 2018-09-22 NOTE — H&P (Signed)
[facility] NAME: Jeremy [name]    MR#:  [id number]  DATE OF BIRTH:  1960/10/20  DATE OF ADMISSION:  09/22/2018  PRIMARY CARE PHYSICIAN: Patient, No Pcp Per   REQUESTING/REFERRING PHYSICIAN:   CHIEF COMPLAINT:   Chief Complaint  Patient presents with  . Cough    HISTORY OF PRESENT ILLNESS: Jeremy [name]  is a 58 y.o. male with a known history of end-stage renal disease on dialysis, hypertension presented to the emergency room for shortness of breath and cough.  Patient currently on oral Zithromax antibiotic for bronchitis.  He is an active tobacco user.  He almost finished the dose of Zithromax antibiotic.  He came for difficulty breathing.  Appears volume overloaded.  BNP is elevated.  Hospitalist service was consulted.  PAST MEDICAL HISTORY:   Past Medical History:  Diagnosis Date  . Hypertension   . Renal disorder    kidney failure, dialysis    PAST SURGICAL HISTORY:  Past Surgical History:  Procedure Laterality Date  . DIALYSIS FISTULA CREATION      SOCIAL HISTORY:  Social History   Tobacco Use  . Smoking status: Current Every Day Smoker    Packs/day: 1.00    Types: Cigarettes  . Smokeless tobacco: Never Used  Substance Use Topics  . Alcohol use: Never    Frequency: Never    FAMILY HISTORY:  Family History  Problem Relation Age of Onset  . Hypertension Mother     DRUG ALLERGIES: No Known Allergies  REVIEW OF SYSTEMS:   CONSTITUTIONAL: No fever, fatigue or weakness.  EYES: No blurred or double vision.  EARS, NOSE, AND THROAT: No tinnitus or ear pain.  RESPIRATORY: Has cough, has shortness of breath,  No wheezing or hemoptysis.  CARDIOVASCULAR: No chest pain, orthopnea, edema.  GASTROINTESTINAL: No nausea, vomiting, diarrhea or abdominal pain.  GENITOURINARY: No dysuria, hematuria.  ENDOCRINE: No polyuria, nocturia,  HEMATOLOGY: No anemia, easy bruising or bleeding SKIN: No rash or  lesion. MUSCULOSKELETAL: No joint pain or arthritis.   NEUROLOGIC: No tingling, numbness, weakness.  PSYCHIATRY: No anxiety or depression.   MEDICATIONS AT HOME:  Prior to Admission medications   Medication Sig Start Date End Date Taking? Authorizing Provider  amLODipine (NORVASC) 10 MG tablet Take 10 mg by mouth daily. 03/29/18   [provider]  azithromycin (ZITHROMAX Z-PAK) 250 MG tablet Take 2 tablets (500 mg) on  Day 1,  followed by 1 tablet (250 mg) once daily on Days 2 through 5. 09/18/18   Duanne Guess, PA-C  chlorpheniramine-HYDROcodone (TUSSIONEX PENNKINETIC ER) 10-8 MG/5ML SUER Take 5 mLs by mouth every 12 (twelve) hours as needed for cough. 09/18/18   Duanne Guess, PA-C  gabapentin (NEURONTIN) 300 MG capsule Take 1 capsule (300 mg total) by mouth daily as needed. 05/19/18 07/18/18  Henreitta Leber, MD  metoprolol tartrate (LOPRESSOR) 25 MG tablet Take 25 mg by mouth daily. 03/29/18   [provider]  predniSONE (DELTASONE) 20 MG tablet Take 2 tablets (40 mg total) by mouth daily. 09/18/18   Duanne Guess, PA-C  sevelamer carbonate (RENVELA) 800 MG tablet Take 1,600 mg by mouth 3 (three) times daily with meals. 03/02/18   [provider]      PHYSICAL EXAMINATION:   VITAL SIGNS: Blood pressure (!) 147/81, pulse 83, temperature 98.2 F (36.8 C), resp. rate 18, height 5\' 6"  (1.676 m), weight 69.9 kg, SpO2 95 %.  GENERAL:  58 y.o.-year-old  patient lying in the bed with no acute distress.  EYES: Pupils equal, round, reactive to light and accommodation. No scleral icterus. Extraocular muscles intact.  HEENT: Head atraumatic, normocephalic. Oropharynx and nasopharynx clear.  NECK:  Supple, no jugular venous distention. No thyroid enlargement, no tenderness.  LUNGS: Normal breath sounds bilaterally, no wheezing, rales,rhonchi or crepitation. No use of accessory muscles of respiration.  CARDIOVASCULAR: S1, S2 normal. No murmurs, rubs, or gallops.   ABDOMEN: Soft, nontender, nondistended. Bowel sounds present. No organomegaly or mass.  EXTREMITIES: No pedal edema, cyanosis, or clubbing.  NEUROLOGIC: Cranial nerves II through XII are intact. Muscle strength 5/5 in all extremities. Sensation intact. Gait not checked.  PSYCHIATRIC: The patient is alert and oriented x 3.  SKIN: No obvious rash, lesion, or ulcer.   LABORATORY PANEL:   CBC Recent Labs  Lab 09/22/18 1550  WBC 6.7  HGB 10.1*  HCT 32.4*  PLT 295  MCV 96.1  MCH 30.0  MCHC 31.2  RDW 15.6*  LYMPHSABS 0.7  MONOABS 0.4  EOSABS 0.0  BASOSABS 0.0   ------------------------------------------------------------------------------------------------------------------  Chemistries  Recent Labs  Lab 09/22/18 1550  NA 136  K 4.7  CL 97*  CO2 25  GLUCOSE 197*  BUN 71*  CREATININE 10.77*  CALCIUM 7.3*  AST 15  ALT 11  ALKPHOS 109  BILITOT 0.7   ------------------------------------------------------------------------------------------------------------------ estimated creatinine clearance is 6.8 mL/min (A) (by C-G formula based on SCr of 10.77 mg/dL (H)). ------------------------------------------------------------------------------------------------------------------ No results for input(s): TSH, T4TOTAL, T3FREE, THYROIDAB in the last 72 hours.  Invalid input(s): FREET3   Coagulation profile No results for input(s): INR, PROTIME in the last 168 hours. ------------------------------------------------------------------------------------------------------------------- No results for input(s): DDIMER in the last 72 hours. -------------------------------------------------------------------------------------------------------------------  Cardiac Enzymes Recent Labs  Lab 09/22/18 1550  TROPONINI 0.03*   ------------------------------------------------------------------------------------------------------------------ Invalid input(s):  POCBNP  ---------------------------------------------------------------------------------------------------------------  Urinalysis No results found for: COLORURINE, APPEARANCEUR, LABSPEC, PHURINE, GLUCOSEU, HGBUR, BILIRUBINUR, KETONESUR, PROTEINUR, UROBILINOGEN, NITRITE, LEUKOCYTESUR   RADIOLOGY: Dg Chest 2 View  Result Date: 09/22/2018 CLINICAL DATA:  Cough, shortness of breath. EXAM: CHEST - 2 VIEW COMPARISON:  Radiographs of September 18, 2018. FINDINGS: Stable cardiomegaly. No pneumothorax or pleural effusion is noted. Both lungs are clear. The visualized skeletal structures are unremarkable. IMPRESSION: No active cardiopulmonary disease. Electronically Signed   By: Marijo Conception, M.D.   On: 09/22/2018 15:33    EKG: Orders placed or performed during the hospital encounter of 09/22/18  . ED EKG  . ED EKG  . EKG 12-Lead  . EKG 12-Lead    IMPRESSION AND PLAN:  58 year old male patient with history of end-stage renal disease on dialysis, hypertension presented to the emergency room for shortness of breath and cough  -Fluid overload Needs dialysis Nephrology consult  -End-stage renal disease Nephrology consult for dialysis Usually gets dialyzed on Tuesday Thursday and Saturday  -Acute on chronic bronchitis Start oral doxycycline antibiotic Did not respond to Zithromax Mucolytic's  -DVT prophylaxis subcu heparin  -Hypertension Resume amlodipine and metoprolol for control of blood pressure  All the records are reviewed and case discussed with ED provider. Management plans discussed with the patient, family and they are in agreement.  CODE STATUS:Full code Code Status History    Date Active Date Inactive Code Status Order ID Comments User Context   05/16/2018 1709 05/19/2018 1433 Full Code [id number]  Vaughan Basta, MD Inpatient       TOTAL TIME TAKING CARE OF THIS PATIENT: 53 minutes.    Saundra Shelling M.D on  09/22/2018 at 5:33 PM  Between 7am to 6pm -  Pager - [phone]  After 6pm go to [email or web address] - password [email or web address] [facility]  Office  [phone]  CC: Primary care physician; Patient, No Pcp Per

## 2018-09-22 NOTE — Progress Notes (Signed)
Pt actively refusing telemetry. Central monitoring has currently placed patient on stand by d/t pt. Repeated removal of telemetry leads. On MD has been notified of pt refusal and with request to d/c the order for monitoring d/t pt refusal. Pt has been educated on purpose of monitor and. Pt refusal stands.

## 2018-09-22 NOTE — ED Triage Notes (Signed)
Pt comes via POV from home with c/o cough and back pain. Pt states he was recently here for same complaints. Pt states he was given prescriptions for medications.  Pt states one of the medications his insurance wouldn't cover so he didn't get it filled. Pt states he isn't getting any better.

## 2018-09-22 NOTE — ED Provider Notes (Signed)
[facility] Emergency Department Provider Note  ____________________________________________  Time seen: Approximately 3:12 PM  I have reviewed the triage vital signs and the nursing notes.   HISTORY  Chief Complaint Cough    HPI Jeremy [name] is a 58 y.o. male who presents the emergency department complaining of increased shortness of breath, chest tightness, worsening cough.  Patient was evaluated in this department 3 days ago.  Chest x-ray revealed  mild perihilar infiltrate that was likely edema secondary to his CHF but he was treated empirically for pneumonia.  Patient was placed on antibiotics, prednisone, cough medication.  Patient had a negative influenza test.  Other family members had similar symptoms and was likely viral/CHF exacerbation/pneumonia.  Patient was treated for these complaints.  Patient reports that he has been on the antibiotics, started on a second antibiotic yesterday which he is unsure the name of.  Patient has been taking the prednisone and cough medication with no relief.  Patient feels weaker, more short of breath, now with a burning chest pain.  Patient is on dialysis for end-stage renal disease.  Patient reports that his last dialysis was yesterday.  He does report that his "dry weight" has increased from 60 kg to 67 kg over the past 3 weeks.  His "wet weight" has increased from 67 to 70 kg.  Patient reports that he has had some mild edema bilateral hands but denies any pedal edema.  Patient denies any fever or chills, headache, nasal congestion, sore throat, visual changes, neck pain or stiffness, abdominal pain, nausea vomiting, diarrhea or constipation.  Other than prescribed medications, patient is not been taking any medications for this complaint.  Patient does have a history shortness of breath with normal O2 saturation on room air hovering around 94%.  Patient does have exertional dyspnea.   Past Medical History:  Diagnosis Date  .  Hypertension   . Renal disorder    kidney failure, dialysis    Patient Active Problem List   Diagnosis Date Noted  . Pulmonary edema 09/22/2018  . Hyperkalemia 05/16/2018    Past Surgical History:  Procedure Laterality Date  . DIALYSIS FISTULA CREATION      Prior to Admission medications   Medication Sig Start Date End Date Taking? Authorizing Provider  amLODipine (NORVASC) 10 MG tablet Take 10 mg by mouth daily. 03/29/18  Yes [provider]  azithromycin (ZITHROMAX Z-PAK) 250 MG tablet Take 2 tablets (500 mg) on  Day 1,  followed by 1 tablet (250 mg) once daily on Days 2 through 5. 09/18/18  Yes Duanne Guess, PA-C  gabapentin (NEURONTIN) 300 MG capsule Take 1 capsule (300 mg total) by mouth daily as needed. 05/19/18 09/22/18 Yes Sainani, Belia Heman, MD  metoprolol tartrate (LOPRESSOR) 25 MG tablet Take 25 mg by mouth daily. 03/29/18  Yes [provider]  predniSONE (DELTASONE) 20 MG tablet Take 2 tablets (40 mg total) by mouth daily. 09/18/18  Yes Duanne Guess, PA-C  sevelamer carbonate (RENVELA) 800 MG tablet Take 1,600 mg by mouth 3 (three) times daily with meals. 03/02/18  Yes [provider]  chlorpheniramine-HYDROcodone (TUSSIONEX PENNKINETIC ER) 10-8 MG/5ML SUER Take 5 mLs by mouth every 12 (twelve) hours as needed for cough. Patient not taking: Reported on 09/22/2018 09/18/18   Duanne Guess, PA-C    Allergies Patient has no known allergies.  Family History  Problem Relation Age of Onset  . Hypertension Mother     Social History Social History  Tobacco Use  . Smoking status: Current Every Day Smoker    Packs/day: 1.00    Types: Cigarettes  . Smokeless tobacco: Never Used  Substance Use Topics  . Alcohol use: Never    Frequency: Never  . Drug use: Not on file     Review of Systems  Constitutional: No fever/chills Eyes: No visual changes. No discharge ENT: No upper respiratory complaints. Cardiovascular: Positive chest  pain/tightness. Respiratory: Positive cough.  Positive SOB. Gastrointestinal: No abdominal pain.  No nausea, no vomiting.  No diarrhea.  No constipation. Genitourinary: Negative for dysuria. No hematuria Musculoskeletal: Negative for musculoskeletal pain. Skin: Negative for rash, abrasions, lacerations, ecchymosis. Neurological: Negative for headaches, focal weakness or numbness. 10-point ROS otherwise negative.  ____________________________________________   PHYSICAL EXAM:  VITAL SIGNS: ED Triage Vitals  Enc Vitals Group     BP 09/22/18 1405 (!) 147/81     Pulse Rate 09/22/18 1405 83     Resp 09/22/18 1405 18     Temp 09/22/18 1405 98.2 F (36.8 C)     Temp src --      SpO2 09/22/18 1405 95 %     Weight 09/22/18 1406 154 lb (69.9 kg)     Height 09/22/18 1406 5\' 6"  (1.676 m)     Head Circumference --      Peak Flow --      Pain Score 09/22/18 1406 5     Pain Loc --      Pain Edu? --      Excl. in [place]? --      Constitutional: Alert and oriented. Well appearing and in no acute distress. Eyes: Conjunctivae are normal. PERRL. EOMI. Head: Atraumatic. ENT:      Ears: EACs and TMs unremarkable bilaterally.      Nose: Mild clear congestion/rhinnorhea.      Mouth/Throat: Mucous membranes are moist.  Oropharynx is nonerythematous and nonedematous.  Uvula is midline. Neck: No stridor.  No cervical spine tenderness to palpation.  Neck is supple full range of motion Hematological/Lymphatic/Immunilogical: No cervical lymphadenopathy. Cardiovascular: Normal rate, regular rhythm. Normal S1 and S2.  No murmurs, rubs, gallops.  Good peripheral circulation.  Visualization of upper and lower extremities reveals no gross edema. Respiratory: Normal respiratory effort without tachypnea or retractions. Lungs with a few scattered expiratory wheezes.  No expiratory wheezing.  No rales or rhonchi identified.Kermit Balo air entry to the bases with no decreased or absent breath sounds. Gastrointestinal:  Bowel sounds 4 quadrants. Soft and nontender to palpation. No guarding or rigidity. No palpable masses. No distention. No CVA tenderness. Musculoskeletal: Full range of motion to all extremities. No gross deformities appreciated. Neurologic:  Normal speech and language. No gross focal neurologic deficits are appreciated.  Skin:  Skin is warm, dry and intact. No rash noted. Psychiatric: Mood and affect are normal. Speech and behavior are normal. Patient exhibits appropriate insight and judgement.   ____________________________________________   LABS (all labs ordered are listed, but only abnormal results are displayed)  Labs Reviewed  COMPREHENSIVE METABOLIC PANEL - Abnormal; Notable for the following components:      Result Value   Chloride 97 (*)    Glucose, Bld 197 (*)    BUN 71 (*)    Creatinine, Ser 10.77 (*)    Calcium 7.3 (*)    Albumin 3.3 (*)    GFR calc non Af Amer 5 (*)    GFR calc Af Amer 5 (*)    All other components within normal limits  BRAIN NATRIURETIC PEPTIDE - Abnormal; Notable for the following components:   B Natriuretic Peptide 1,658.0 (*)    All other components within normal limits  TROPONIN I - Abnormal; Notable for the following components:   Troponin I 0.03 (*)    All other components within normal limits  CBC WITH DIFFERENTIAL/PLATELET - Abnormal; Notable for the following components:   RBC 3.37 (*)    Hemoglobin 10.1 (*)    HCT 32.4 (*)    RDW 15.6 (*)    All other components within normal limits  LACTIC ACID, PLASMA  LACTIC ACID, PLASMA   ____________________________________________  EKG  ED ECG REPORT I, Charline Bills Ryota Treece,  personally viewed and interpreted this ECG.   Date: 09/22/2018  EKG Time: 1657 hrs.  Rate: 85 bpm  Rhythm: normal EKG, normal sinus rhythm, unchanged from previous tracings  Axis: Normal axis  Intervals:Mild QT interval prolongation  ST&T Change: No ST elevation or depression noted.  No STEMI.  Normal sinus  rhythm.  Slightly prolonged QT interval.  Compared to previous EKG, relatively unchanged.  ____________________________________________  RADIOLOGY I personally viewed and evaluated these images as part of my medical decision making, as well as reviewing the written report by the radiologist.  I concur with radiologist finding of no acute cardiopulmonary abnormality.  Dg Chest 2 View  Result Date: 09/22/2018 CLINICAL DATA:  Cough, shortness of breath. EXAM: CHEST - 2 VIEW COMPARISON:  Radiographs of September 18, 2018. FINDINGS: Stable cardiomegaly. No pneumothorax or pleural effusion is noted. Both lungs are clear. The visualized skeletal structures are unremarkable. IMPRESSION: No active cardiopulmonary disease. Electronically Signed   By: Marijo Conception, M.D.   On: 09/22/2018 15:33    ____________________________________________    PROCEDURES  Procedure(s) performed:    Procedures    Medications  nicotine (NICODERM CQ - dosed in mg/24 hours) patch 21 mg (has no administration in time range)  doxycycline (VIBRA-TABS) tablet 100 mg (has no administration in time range)  benzonatate (TESSALON) capsule 100 mg (100 mg Oral Given 09/22/18 1801)     ____________________________________________   INITIAL IMPRESSION / ASSESSMENT AND PLAN / ED COURSE  Pertinent labs & imaging results that were available during my care of the patient were reviewed by me and considered in my medical decision making (see chart for details).  Review of the [place] CSRS was performed in accordance of the [place] prior to dispensing any controlled drugs.  Clinical Course as of Sep 22 1818  Wed Sep 22, 2018  1710 Patient presented to the hospitalist service at 1710 hrs.  They are agreeable to admitting the patient.  Patient care will be transferred to the hospitalist service.   [JC]    Clinical Course User Index [JC] Ayen Viviano, Charline Bills, PA-C     Patient's diagnosis is consistent with bronchitis, acute on  chronic congestive heart failure, end-stage renal disease, elevated troponin.  Patient presents emergency department with no improvement of symptoms.  Patient continues to complain of coughing, shortness of breath, chest pain.  Patient is on dialysis, had dialysis yesterday.  He reports that over the past 3 weeks he has had a dry weight increase of 6 kg, wet weight increase of 5 kg on average.  Currently, no pitting edema is identified.  Lung sounds with a few scattered wheezes.  No rales or rhonchi.  Chest x-ray reveals no pulmonary edema or consolidation.  EKG with mild QT prolongation but otherwise unremarkable and consistent with previous EKG.  Patient's labs reveal  BNP of 1600, elevated troponin.  With patient's symptoms, no improvement with outpatient treatment, history, patient would be a good candidate for admission for management of his bronchitis, CHF, end-stage renal disease.   I spoke with Dr. Estanislado Pandy, who agrees that patient would be a good candidate for admission.  Patient care will be transferred to hospital service at this time..    ____________________________________________  FINAL CLINICAL IMPRESSION(S) / ED DIAGNOSES  Final diagnoses:  Bronchitis  Acute on chronic congestive heart failure, unspecified heart failure type (HCC)  ESRD (end stage renal disease) on dialysis (HCC)  Elevated troponin      NEW MEDICATIONS STARTED DURING THIS VISIT:  ED Discharge Orders    None          This chart was dictated using voice recognition software/Dragon. Despite best efforts to proofread, errors can occur which can change the meaning. Any change was purely unintentional.    Darletta Moll, PA-C 09/22/18 1820    Merlyn Lot, MD 09/22/18 [phone]

## 2018-09-22 NOTE — ED Provider Notes (Signed)
-----------------------------------------   4:59 PM on 09/22/2018 -----------------------------------------  EKG viewed and interpreted by myself shows a normal sinus rhythm 85 bpm with a narrow QRS, normal axis, normal intervals, no concerning ST changes.  Reassuring EKG.   Harvest Dark, MD 09/22/18 1700

## 2018-09-23 ENCOUNTER — Encounter: Payer: Self-pay | Admitting: Internal Medicine

## 2018-09-23 LAB — CBC
HCT: 32.2 % — ABNORMAL LOW (ref 39.0–52.0)
HEMOGLOBIN: 10 g/dL — AB (ref 13.0–17.0)
MCH: 29.5 pg (ref 26.0–34.0)
MCHC: 31.1 g/dL (ref 30.0–36.0)
MCV: 95 fL (ref 80.0–100.0)
Platelets: 287 10*3/uL (ref 150–400)
RBC: 3.39 MIL/uL — ABNORMAL LOW (ref 4.22–5.81)
RDW: 15.8 % — ABNORMAL HIGH (ref 11.5–15.5)
WBC: 8.1 10*3/uL (ref 4.0–10.5)
nRBC: 0 % (ref 0.0–0.2)

## 2018-09-23 LAB — PHOSPHORUS: Phosphorus: 7.5 mg/dL — ABNORMAL HIGH (ref 2.5–4.6)

## 2018-09-23 LAB — ECHOCARDIOGRAM COMPLETE
Height: 66 in
WEIGHTICAEL: 2464 [oz_av]

## 2018-09-23 LAB — BASIC METABOLIC PANEL
Anion gap: 16 — ABNORMAL HIGH (ref 5–15)
BUN: 84 mg/dL — ABNORMAL HIGH (ref 6–20)
CO2: 23 mmol/L (ref 22–32)
Calcium: 7.1 mg/dL — ABNORMAL LOW (ref 8.9–10.3)
Chloride: 97 mmol/L — ABNORMAL LOW (ref 98–111)
Creatinine, Ser: 12.13 mg/dL — ABNORMAL HIGH (ref 0.61–1.24)
GFR calc Af Amer: 5 mL/min — ABNORMAL LOW (ref >60)
GFR calc non Af Amer: 4 mL/min — ABNORMAL LOW (ref >60)
Glucose, Bld: 103 mg/dL — ABNORMAL HIGH (ref 70–99)
Potassium: 4.5 mmol/L (ref 3.5–5.1)
Sodium: 136 mmol/L (ref 135–145)

## 2018-09-23 LAB — TROPONIN I: Troponin I: 0.03 ng/mL (ref <0.03)

## 2018-09-23 MED ORDER — DIPHENHYDRAMINE HCL 25 MG PO CAPS
25.0000 mg | ORAL_CAPSULE | Freq: Once | ORAL | Status: AC
  Administered 2018-09-23: 02:00:00 25 mg via ORAL
  Filled 2018-09-23: qty 1

## 2018-09-23 MED ORDER — ALTEPLASE 2 MG IJ SOLR
2.0000 mg | Freq: Once | INTRAMUSCULAR | Status: DC | PRN
  Filled 2018-09-23: qty 2

## 2018-09-23 MED ORDER — GABAPENTIN 300 MG PO CAPS: 300.0000 mg | ORAL_CAPSULE | ORAL | Status: DC | PRN

## 2018-09-23 MED ORDER — LIDOCAINE-PRILOCAINE 2.5-2.5 % EX CREA
1.0000 "application " | TOPICAL_CREAM | CUTANEOUS | Status: DC | PRN
  Filled 2018-09-23: qty 5

## 2018-09-23 MED ORDER — SODIUM CHLORIDE 0.9 % IV SOLN: 100.0000 mL | INTRAVENOUS | Status: DC | PRN

## 2018-09-23 MED ORDER — PENTAFLUOROPROP-TETRAFLUOROETH EX AERO
1.0000 "application " | INHALATION_SPRAY | CUTANEOUS | Status: DC | PRN
  Filled 2018-09-23: qty 30

## 2018-09-23 MED ORDER — HEPARIN SODIUM (PORCINE) 1000 UNIT/ML DIALYSIS
1000.0000 [IU] | INTRAMUSCULAR | Status: DC | PRN
  Filled 2018-09-23: qty 1

## 2018-09-23 MED ORDER — PREDNISONE 10 MG PO TABS: 10.0000 mg | ORAL_TABLET | Freq: Every day | ORAL | 0 refills | Status: DC

## 2018-09-23 MED ORDER — SEVELAMER CARBONATE 800 MG PO TABS
2400.0000 mg | ORAL_TABLET | Freq: Three times a day (TID) | ORAL | Status: DC
  Filled 2018-09-23: qty 3

## 2018-09-23 MED ORDER — CHLORHEXIDINE GLUCONATE CLOTH 2 % EX PADS: 6.0000 | MEDICATED_PAD | Freq: Every day | CUTANEOUS | Status: DC

## 2018-09-23 MED ORDER — LIDOCAINE HCL (PF) 1 % IJ SOLN
5.0000 mL | INTRAMUSCULAR | Status: DC | PRN
  Filled 2018-09-23: qty 5

## 2018-09-23 MED ORDER — DOXYCYCLINE HYCLATE 100 MG PO TABS: 100.0000 mg | ORAL_TABLET | Freq: Two times a day (BID) | ORAL | 0 refills | Status: AC

## 2018-09-23 NOTE — Progress Notes (Signed)
HD TX started w/o compication   09/23/18 0848  Vital Signs  Temp 98.4 F (36.9 C)  Temp Source Oral  Pulse Rate 85  Pulse Rate Source Monitor  Resp 20  BP (!) 141/75  BP Location Right Arm  BP Method Automatic  Patient Position (if appropriate) Lying  Oxygen Therapy  SpO2 96 %  O2 Device Room Air  Pulse Oximetry Type Continuous  Pain Assessment  Pain Scale 0-10  Pain Score 0  Dialysis Weight  Weight 70 kg  Type of Weight Pre-Dialysis  Time-Out for Hemodialysis  What Procedure? HD   Pt Identifiers(min of two) First/Last Name;MRN/Account#  Correct Site? Yes  Correct Side? Yes  Correct Procedure? Yes  Consents Verified? Yes  Rad Studies Available? N/A  Safety Precautions Reviewed? Yes  Engineer, civil (consulting) Number 1  Station Number 4  UF/Alarm Test Passed  Conductivity: Meter 14  Conductivity: Machine  14  pH 7.4  Reverse Osmosis Main  Normal Saline Lot Number [id number]  Dialyzer Lot Number [id number]  Disposable Set Lot Number [id number]  Machine Temperature 98.6 F (37 C)  Musician and Audible Yes  Blood Lines Intact and Secured Yes  Pre Treatment Patient Checks  Vascular access used during treatment Fistula  Hepatitis B Surface Antigen Results Negative  Date Hepatitis B Surface Antigen Drawn 05/18/18  Hepatitis B Surface Antibody  (>10)  Date Hepatitis B Surface Antibody Drawn 05/18/18  Hemodialysis Consent Verified Yes  Hemodialysis Standing Orders Initiated Yes  ECG (Telemetry) Monitor On Yes  Prime Ordered Normal Saline  Length of  DialysisTreatment -hour(s) 3.5 Hour(s)  Dialysis Treatment Comments Na 140  Dialyzer Elisio 17H NR  Dialysate 2K, 2.5 Ca  Dialysis Anticoagulant None  Dialysate Flow Ordered 800  Blood Flow Rate Ordered 400 mL/min  Ultrafiltration Goal 2 Liters  Pre Treatment Labs Phosphorus  Dialysis Blood Pressure Support Ordered Normal Saline  During Hemodialysis Assessment  Blood Flow Rate (mL/min) 400 mL/min  Arterial  Pressure (mmHg) -120 mmHg  Venous Pressure (mmHg) 180 mmHg  Transmembrane Pressure (mmHg) 70 mmHg  Ultrafiltration Rate (mL/min) 1170 mL/min  Dialysate Flow Rate (mL/min) 80 ml/min  Conductivity: Machine  14.1  HD Safety Checks Performed Yes  Dialysis Fluid Bolus Normal Saline  Bolus Amount (mL) 250 mL  Intra-Hemodialysis Comments Tx initiated  Education / Care Plan  Dialysis Education Provided Yes  Documented Education in Care Plan Yes

## 2018-09-23 NOTE — Progress Notes (Signed)
Pre HD Assessment    09/23/18 0840  Neurological  Level of Consciousness Alert  Orientation Level Oriented X4  Respiratory  Respiratory Pattern Regular  Chest Assessment Chest expansion symmetrical  Bilateral Breath Sounds Diminished  Cough None  Cardiac  Pulse Regular  Heart Sounds S1, S2  ECG Monitor Yes  Vascular  Edema Generalized  Psychosocial  Psychosocial (WDL) WDL

## 2018-09-23 NOTE — Discharge Summary (Signed)
[facility] [place] NAME: Jeremy [name]    MR#:  [id number]  DATE OF BIRTH:  1961-09-02  DATE OF ADMISSION:  09/22/2018 ADMITTING PHYSICIAN: Saundra Shelling, MD  DATE OF DISCHARGE: 09/23/2018  PRIMARY CARE PHYSICIAN: Jeremy [name]   ADMISSION DIAGNOSIS:  Bronchitis [J40] Elevated troponin [R79.89] ESRD (end stage renal disease) on dialysis ([place]) [N18.6, Z99.2] Acute on chronic congestive heart failure, unspecified heart failure type ([place]) [I50.9]  DISCHARGE DIAGNOSIS:  Active Problems:   Pulmonary edema Acute on chronic bronchitis End-stage renal disease on dialysis Elevated troponin could be from demand ischemia  SECONDARY DIAGNOSIS:   Past Medical History:  Diagnosis Date  . Bronchitis   . Hypertension   . Renal disorder    kidney failure, dialysis     ADMITTING HISTORY  Jeremy [name]  is a 57 y.o. male with a known history of end-stage renal disease on dialysis, hypertension presented to the emergency room for shortness of breath and cough.  Patient currently on oral Zithromax antibiotic for bronchitis.  He is an active tobacco user.  He almost finished the dose of Zithromax antibiotic.  He came for difficulty breathing.  Appears volume overloaded.  BNP is elevated.  Hospitalist service was consulted.  HOSPITAL COURSE:  Patient was admitted to medical floor.  He received mucolytic's and antitussive medication.  He was started on oral doxycycline antibiotic.  Cough improved.  Nephrology consult was done.  Patient had dialysis and excess fluid removed.  Shortness of breath also improved.  Patient hemodynamically stable will be discharged home.  CONSULTS OBTAINED:  Treatment Team:  Anthonette Legato, MD  DRUG ALLERGIES:  No Known Allergies  DISCHARGE MEDICATIONS:   Allergies as of 09/23/2018   No Known Allergies     Medication List    STOP taking these medications   azithromycin 250 MG tablet Commonly known as:  ZITHROMAX  Z-PAK     TAKE these medications   amLODipine 10 MG tablet Commonly known as:  NORVASC Take 10 mg by mouth daily.   chlorpheniramine-HYDROcodone 10-8 MG/5ML Suer Commonly known as:  TUSSIONEX PENNKINETIC ER Take 5 mLs by mouth every 12 (twelve) hours as needed for cough.   doxycycline 100 MG tablet Commonly known as:  VIBRA-TABS Take 1 tablet (100 mg total) by mouth every 12 (twelve) hours for 4 days.   gabapentin 300 MG capsule Commonly known as:  NEURONTIN Take 1 capsule (300 mg total) by mouth daily as needed.   metoprolol tartrate 25 MG tablet Commonly known as:  LOPRESSOR Take 25 mg by mouth daily.   predniSONE 10 MG tablet Commonly known as:  DELTASONE Take 1 tablet (10 mg total) by mouth daily. Label  & dispense according to the schedule below.  6 tablets day one, then 5 table day 2, then 4 tablets day 3, then 3 tablets day 4, 2 tablets day 5, then 1 tablet day 6, then stop What changed:    medication strength  how much to take  additional instructions   sevelamer carbonate 800 MG tablet Commonly known as:  RENVELA Take 1,600 mg by mouth 3 (three) times daily with meals.       Today  Patient seen today Decreased cough No shortness of breath Breathing comfortably on room air Hemodynamically stable Completed dialysis session  VITAL SIGNS:  Blood pressure 138/77, pulse 78, temperature 98.7 F (37.1 C), temperature source Oral, resp. rate 15, height 5\' 6"  (1.676 m), weight 66.7 kg, SpO2  100 %.  I/O:    Intake/Output Summary (Last 24 hours) at 09/23/2018 1420 Last data filed at 09/23/2018 1215 Gross [name] 24 hour  Intake -  Output 3016 ml  Net -3016 ml    PHYSICAL EXAMINATION:  Physical Exam  GENERAL:  58 y.o.-year-old patient lying in the bed with no acute distress.  LUNGS: Normal breath sounds bilaterally, no wheezing, rales,rhonchi or crepitation. No use of accessory muscles of respiration.  CARDIOVASCULAR: S1, S2 normal. No murmurs, rubs, or  gallops.  ABDOMEN: Soft, non-tender, non-distended. Bowel sounds present. No organomegaly or mass.  NEUROLOGIC: Moves all 4 extremities. PSYCHIATRIC: The patient is alert and oriented x 3.  SKIN: No obvious rash, lesion, or ulcer.   DATA REVIEW:   CBC Recent Labs  Lab 09/23/18 0517  WBC 8.1  HGB 10.0*  HCT 32.2*  PLT 287    Chemistries  Recent Labs  Lab 09/22/18 1550 09/23/18 0517  NA 136 136  K 4.7 4.5  CL 97* 97*  CO2 25 23  GLUCOSE 197* 103*  BUN 71* 84*  CREATININE 10.77* 12.13*  CALCIUM 7.3* 7.1*  AST 15  --   ALT 11  --   ALKPHOS 109  --   BILITOT 0.7  --     Cardiac Enzymes Recent Labs  Lab 09/23/18 0517  TROPONINI 0.03*    Microbiology Results  Results for orders placed or performed during the hospital encounter of 05/16/18  MRSA PCR Screening     Status: None   Collection Time: 05/19/18  5:45 AM  Result Value Ref Range Status   MRSA by PCR NEGATIVE NEGATIVE Final    Comment:        The GeneXpert MRSA Assay (FDA approved for NASAL specimens only), is one component of a comprehensive MRSA colonization surveillance program. It is not intended to diagnose MRSA infection nor to guide or monitor treatment for MRSA infections. Performed at [facility], [place], [address], [place], [place] [address]     RADIOLOGY:  Dg Chest 2 View  Result Date: 09/22/2018 CLINICAL DATA:  Cough, shortness of breath. EXAM: CHEST - 2 VIEW COMPARISON:  Radiographs of September 18, 2018. FINDINGS: Stable cardiomegaly. No pneumothorax or pleural effusion is noted. Both lungs are clear. The visualized skeletal structures are unremarkable. IMPRESSION: No active cardiopulmonary disease. Electronically Signed   By: Marijo Conception, M.D.   On: 09/22/2018 15:33    Follow up with PCP in 1 week.  Management plans discussed with the patient, family and they are in agreement.  CODE STATUS: Full code    Code Status Orders  (From admission, onward)         Start      Ordered   09/22/18 1842  Full code  Continuous     09/22/18 1841        Code Status History    Date Active Date Inactive Code Status Order ID Comments User Context   05/16/2018 1709 05/19/2018 1433 Full Code [id number]  Vaughan Basta, MD Inpatient      TOTAL TIME TAKING CARE OF THIS PATIENT ON DAY OF DISCHARGE: more than 34 minutes.   Saundra Shelling M.D on 09/23/2018 at 2:20 PM  Between 7am to 6pm - Pager - [phone]  After 6pm go to [email or web address] - password [email or web address] [place] [facility]  Office  [phone]  CC: Primary care physician; Jeremy [name]  Note: This dictation was prepared with Dragon dictation along with smaller phrase technology. Any  transcriptional errors that result from this process are unintentional.

## 2018-09-23 NOTE — Progress Notes (Signed)
Post HD [facility]    09/23/18 1215  Hand-Off documentation  Report given to (Full Name) Collier Bullock, RN   Report received from (Full Name) Beatris Ship, RN   Vital Signs  Temp 98.7 F (37.1 C)  Temp Source Oral  Pulse Rate 78  Pulse Rate Source Monitor  Resp 15  BP 138/77  BP Location Right Arm  BP Method Automatic  Patient Position (if appropriate) Lying  Oxygen Therapy  SpO2 100 %  O2 Device Room Air  Pulse Oximetry Type Continuous  Pain Assessment  Pain Scale 0-10  Pain Score 0  Dialysis Weight  Weight 66.7 kg  Type of Weight Post-Dialysis  Post-Hemodialysis Assessment  Rinseback Volume (mL) 250 mL  KECN 87.7 V  Dialyzer Clearance Lightly streaked  Duration of HD Treatment -hour(s) 3.5 hour(s)  Hemodialysis Intake (mL) 500 mL  UF Total -Machine (mL) 3516 mL  Net UF (mL) 3016 mL  Tolerated HD Treatment Yes  AVG/AVF Arterial Site Held (minutes) 10 minutes  AVG/AVF Venous Site Held (minutes) 10 minutes

## 2018-09-23 NOTE — Progress Notes (Signed)
Post HD Assessment    09/23/18 1215  Neurological  Level of Consciousness Alert  Orientation Level Oriented X4  Respiratory  Respiratory Pattern Regular  Chest Assessment Chest expansion symmetrical  Bilateral Breath Sounds Clear  Cough None  Cardiac  Pulse Regular  Heart Sounds S1, S2  ECG Monitor Yes  Vascular  Edema Generalized  Psychosocial  Psychosocial (WDL) WDL

## 2018-09-23 NOTE — Progress Notes (Signed)
HD tx started w/o issue

## 2018-09-23 NOTE — Care Management Note (Signed)
Case Management Note  Patient Details  Name: Jeremy [name] MRN: [id number] Date of Birth: [birth year]  Subjective/Objective:  Patient admitted to [facility] under observation status for pleural effusion. RNCM consulted on patient to provide MOON letter and complete assessment. Patient lives alone and is independent at baseline. Patient still drives. Provides his own transport to dialysis. Uses [facility] [place] [facility]. Patient uses no DME. Has no PCP and prefers to continue to use his nephrologist for services.                      Action/Plan: Notified Dell Ponto, Dialysis liaison with patient pathways of admission.    Expected Discharge Date:                  Expected Discharge Plan:     In-House Referral:     Discharge planning Services     Post Acute Care Choice:    Choice offered to:     DME Arranged:    DME Agency:     HH Arranged:    HH Agency:     Status of Service:     If discussed at [employer], dates discussed:    Additional Comments:  Latanya Maudlin, RN 09/23/2018, 1:46 PM

## 2018-09-23 NOTE — Plan of Care (Signed)
Pt refuse central telemetry monitoring order d/c'd. Added Robitussin PRN q 4 (see mar) and obtained one time order for benadryl to assist pt to sleep. Pt remains awake currently unable to sleep. Is currently not asking for any other interventions to aid in ability to sleep. Will continue to monitor.

## 2018-09-23 NOTE — Progress Notes (Signed)
HD Tx completed, tolerated well, pt challanged fluid removal at 3 L tolerated very wel pt states he feels better and breathing is easier    09/23/18 1213  Vital Signs  Pulse Rate 76  Pulse Rate Source Monitor  Resp 14  BP (!) 143/77  BP Location Right Arm  BP Method Automatic  Patient Position (if appropriate) Lying  Oxygen Therapy  SpO2 100 %  O2 Device Room Air  Pulse Oximetry Type Continuous  During Hemodialysis Assessment  HD Safety Checks Performed Yes  KECN 87.7 KECN  Dialysis Fluid Bolus Normal Saline  Bolus Amount (mL) 250 mL  Intra-Hemodialysis Comments Tx completed;Tolerated well

## 2018-09-23 NOTE — Progress Notes (Signed)
[facility]  ROUNDING NOTE   Subjective:  Patient seen and evaluated during hemodialysis. Tolerating well. He came in with cough and acute on chronic bronchitis.   Objective:  Vital signs in last 24 hours:  Temp:  [97.7 F (36.5 C)-98.8 F (37.1 C)] 98.8 F (37.1 C) (01/16 0857) Pulse Rate:  [80-88] 80 (01/16 1115) Resp:  [15-29] 15 (01/16 1115) BP: (130-183)/(66-88) 152/79 (01/16 1115) SpO2:  [92 %-97 %] 96 % (01/16 0857) Weight:  [69.9 kg-70 kg] 70 kg (01/16 0857)  Weight change:  Filed Weights   09/22/18 1406 09/23/18 0848 09/23/18 0857  Weight: 69.9 kg 70 kg 70 kg    Intake/Output: No intake/output data recorded.   Intake/Output this shift:  No intake/output data recorded.  Physical Exam: General: No acute distress  Head: Normocephalic, atraumatic. Moist oral mucosal membranes  Eyes: Anicteric  Neck: Supple, trachea midline  Lungs:  Scattered rhonchi  Heart: S1S2 no rubs  Abdomen:  Soft, nontender, bowel sounds present  Extremities: No peripheral edema.  Neurologic: Awake, alert, following commands  Skin: No lesions  Access: LUE AVF    Basic Metabolic Panel: Recent Labs  Lab 09/22/18 1550 09/23/18 0517  NA 136 136  K 4.7 4.5  CL 97* 97*  CO2 25 23  GLUCOSE 197* 103*  BUN 71* 84*  CREATININE 10.77* 12.13*  CALCIUM 7.3* 7.1*  PHOS  --  7.5*    Liver Function Tests: Recent Labs  Lab 09/22/18 1550  AST 15  ALT 11  ALKPHOS 109  BILITOT 0.7  PROT 7.0  ALBUMIN 3.3*   No results for input(s): LIPASE, AMYLASE in the last 168 hours. No results for input(s): AMMONIA in the last 168 hours.  CBC: Recent Labs  Lab 09/22/18 1550 09/23/18 0517  WBC 6.7 8.1  NEUTROABS 5.6  --   HGB 10.1* 10.0*  HCT 32.4* 32.2*  MCV 96.1 95.0  PLT 295 287    Cardiac Enzymes: Recent Labs  Lab 09/22/18 1550 09/22/18 1923 09/23/18 0050 09/23/18 0517  TROPONINI 0.03* 0.03* 0.03* 0.03*    BNP: Invalid input(s): POCBNP  CBG: No results  for input(s): GLUCAP in the last 168 hours.  Microbiology: Results for orders placed or performed during the hospital encounter of 05/16/18  MRSA PCR Screening     Status: None   Collection Time: 05/19/18  5:45 AM  Result Value Ref Range Status   MRSA by PCR NEGATIVE NEGATIVE Final    Comment:        The GeneXpert MRSA Assay (FDA approved for NASAL specimens only), is one component of a comprehensive MRSA colonization surveillance program. It is not intended to diagnose MRSA infection nor to guide or monitor treatment for MRSA infections. Performed at [facility], [address] [place], [place] [address]     Coagulation Studies: No results for input(s): LABPROT, INR in the last 72 hours.  Urinalysis: No results for input(s): COLORURINE, LABSPEC, PHURINE, GLUCOSEU, HGBUR, BILIRUBINUR, KETONESUR, PROTEINUR, UROBILINOGEN, NITRITE, LEUKOCYTESUR in the last 72 hours.  Invalid input(s): APPERANCEUR    Imaging: Dg Chest 2 View  Result Date: 09/22/2018 CLINICAL DATA:  Cough, shortness of breath. EXAM: CHEST - 2 VIEW COMPARISON:  Radiographs of September 18, 2018. FINDINGS: Stable cardiomegaly. No pneumothorax or pleural effusion is noted. Both lungs are clear. The visualized skeletal structures are unremarkable. IMPRESSION: No active cardiopulmonary disease. Electronically Signed   By: Marijo Conception, M.D.   On: 09/22/2018 15:33     Medications:   .  sodium chloride    . sodium chloride    . sodium chloride     . amLODipine  10 mg Oral Daily  . Chlorhexidine Gluconate Cloth  6 each Topical Q0600  . doxycycline  100 mg Oral Q12H  . heparin  5,000 Units Subcutaneous Q8H  . metoprolol tartrate  25 mg Oral Daily  . nicotine  21 mg Transdermal Daily  . predniSONE  40 mg Oral Daily  . sevelamer carbonate  1,600 mg Oral TID WC  . sodium chloride flush  3 mL Intravenous Q12H   sodium chloride, sodium chloride, sodium chloride, acetaminophen **OR** acetaminophen,  alteplase, chlorpheniramine-HYDROcodone, gabapentin, guaiFENesin-dextromethorphan, heparin, lidocaine (PF), lidocaine-prilocaine, ondansetron **OR** ondansetron (ZOFRAN) IV, pentafluoroprop-tetrafluoroeth, senna-docusate, sodium chloride flush  Assessment/ Plan:  58 y.o. male ESRD on HD since 2012, HTN, h/o DM now diet controlled, h/o CHF, Cardiac ablation, h/o AVF stent and angioplasty, admitted now with cough/acute on chronic bronchitis.  [facility]  1.  ESRD on HD TTS.  Patient seen elevated during hemodialysis.  Tolerating well.  Complete dialysis today.  ALT for progression target 3 kg.  2.  Anemia chronic kidney disease.  Hemoglobin currently 10.0.  Hold off on Epogen for now.  3.  Secondary hyperparathyroidism:  Phos high at 7.5, increase renvela to 2400mg  po tid/wm.    4.  Hypertension:  Continue amlodipine and metoprolol.    LOS: 0 Tevan Marian 1/16/202012:35 PM

## 2018-09-23 NOTE — Care Management Obs Status (Signed)
[facility] NOTIFICATION   Patient Details  Name: Najae Rathert MRN: [id number] Date of Birth: [birth year]   Medicare Observation Status Notification Given:  Yes    Oz Gammel A Novice Vrba, RN 09/23/2018, 1:31 PM

## 2018-09-24 LAB — TROPONIN I: Troponin I: 0.03 ng/mL (ref <0.03)

## 2018-09-24 LAB — PARATHYROID HORMONE, INTACT (NO CA): PTH: 1984 pg/mL — ABNORMAL HIGH (ref 15–65)

## 2018-10-24 ENCOUNTER — Other Ambulatory Visit: Payer: Self-pay

## 2018-10-24 ENCOUNTER — Non-Acute Institutional Stay
Admission: EM | Admit: 2018-10-24 | Discharge: 2018-10-24 | Discharge status: Against Medical Advice | Payer: Medicare Other | Attending: Emergency Medicine | Admitting: Emergency Medicine

## 2018-10-24 ENCOUNTER — Encounter: Payer: Self-pay | Admitting: Emergency Medicine

## 2018-10-24 ENCOUNTER — Emergency Department: Payer: Medicare Other

## 2018-10-24 DIAGNOSIS — I132 Hypertensive heart and chronic kidney disease with heart failure and with stage 5 chronic kidney disease, or end stage renal disease: Secondary | ICD-10-CM | POA: Insufficient documentation

## 2018-10-24 DIAGNOSIS — I700 Atherosclerosis of aorta: Secondary | ICD-10-CM | POA: Diagnosis not present

## 2018-10-24 DIAGNOSIS — N2581 Secondary hyperparathyroidism of renal origin: Secondary | ICD-10-CM | POA: Insufficient documentation

## 2018-10-24 DIAGNOSIS — D631 Anemia in chronic kidney disease: Secondary | ICD-10-CM | POA: Insufficient documentation

## 2018-10-24 DIAGNOSIS — E1122 Type 2 diabetes mellitus with diabetic chronic kidney disease: Secondary | ICD-10-CM | POA: Insufficient documentation

## 2018-10-24 DIAGNOSIS — Z8249 Family history of ischemic heart disease and other diseases of the circulatory system: Secondary | ICD-10-CM | POA: Diagnosis not present

## 2018-10-24 DIAGNOSIS — Z79899 Other long term (current) drug therapy: Secondary | ICD-10-CM | POA: Diagnosis not present

## 2018-10-24 DIAGNOSIS — F17210 Nicotine dependence, cigarettes, uncomplicated: Secondary | ICD-10-CM | POA: Insufficient documentation

## 2018-10-24 DIAGNOSIS — N186 End stage renal disease: Secondary | ICD-10-CM | POA: Insufficient documentation

## 2018-10-24 DIAGNOSIS — I509 Heart failure, unspecified: Secondary | ICD-10-CM | POA: Diagnosis present

## 2018-10-24 DIAGNOSIS — Z992 Dependence on renal dialysis: Secondary | ICD-10-CM | POA: Diagnosis not present

## 2018-10-24 DIAGNOSIS — Z955 Presence of coronary angioplasty implant and graft: Secondary | ICD-10-CM | POA: Diagnosis not present

## 2018-10-24 LAB — COMPREHENSIVE METABOLIC PANEL
ALT: 20 U/L (ref 0–44)
AST: 26 U/L (ref 15–41)
Albumin: 3.2 g/dL — ABNORMAL LOW (ref 3.5–5.0)
Alkaline Phosphatase: 122 U/L (ref 38–126)
Anion gap: 13 (ref 5–15)
BILIRUBIN TOTAL: 0.8 mg/dL (ref 0.3–1.2)
BUN: 90 mg/dL — ABNORMAL HIGH (ref 6–20)
CO2: 23 mmol/L (ref 22–32)
Calcium: 8.5 mg/dL — ABNORMAL LOW (ref 8.9–10.3)
Chloride: 102 mmol/L (ref 98–111)
Creatinine, Ser: 15.45 mg/dL — ABNORMAL HIGH (ref 0.61–1.24)
GFR calc Af Amer: 4 mL/min — ABNORMAL LOW (ref >60)
GFR calc non Af Amer: 3 mL/min — ABNORMAL LOW (ref >60)
Glucose, Bld: 134 mg/dL — ABNORMAL HIGH (ref 70–99)
POTASSIUM: 5.4 mmol/L — AB (ref 3.5–5.1)
Sodium: 138 mmol/L (ref 135–145)
Total Protein: 6.8 g/dL (ref 6.5–8.1)

## 2018-10-24 LAB — CBC WITH DIFFERENTIAL/PLATELET
Abs Immature Granulocytes: 0.01 10*3/uL (ref 0.00–0.07)
BASOS ABS: 0 10*3/uL (ref 0.0–0.1)
Basophils Relative: 1 %
Eosinophils Absolute: 0.3 10*3/uL (ref 0.0–0.5)
Eosinophils Relative: 4 %
HCT: 29.9 % — ABNORMAL LOW (ref 39.0–52.0)
Hemoglobin: 9.3 g/dL — ABNORMAL LOW (ref 13.0–17.0)
IMMATURE GRANULOCYTES: 0 %
Lymphocytes Relative: 13 %
Lymphs Abs: 0.8 10*3/uL (ref 0.7–4.0)
MCH: 30.1 pg (ref 26.0–34.0)
MCHC: 31.1 g/dL (ref 30.0–36.0)
MCV: 96.8 fL (ref 80.0–100.0)
Monocytes Absolute: 0.5 10*3/uL (ref 0.1–1.0)
Monocytes Relative: 8 %
NEUTROS PCT: 74 %
Neutro Abs: 4.4 10*3/uL (ref 1.7–7.7)
Platelets: 244 10*3/uL (ref 150–400)
RBC: 3.09 MIL/uL — ABNORMAL LOW (ref 4.22–5.81)
RDW: 16.3 % — ABNORMAL HIGH (ref 11.5–15.5)
WBC: 6 10*3/uL (ref 4.0–10.5)
nRBC: 0 % (ref 0.0–0.2)

## 2018-10-24 LAB — BRAIN NATRIURETIC PEPTIDE: B Natriuretic Peptide: 3562 pg/mL — ABNORMAL HIGH (ref 0.0–100.0)

## 2018-10-24 LAB — TROPONIN I
Troponin I: 0.06 ng/mL (ref <0.03)
Troponin I: 0.06 ng/mL (ref <0.03)

## 2018-10-24 MED ORDER — CHLORHEXIDINE GLUCONATE CLOTH 2 % EX PADS
6.0000 | MEDICATED_PAD | Freq: Every day | CUTANEOUS | Status: DC
  Filled 2018-10-24 (×2): qty 6

## 2018-10-24 MED ORDER — EPOETIN ALFA 10000 UNIT/ML IJ SOLN
10000.0000 [IU] | Freq: Once | INTRAMUSCULAR | Status: AC
  Administered 2018-10-24: 10000 [IU] via INTRAVENOUS

## 2018-10-24 MED ORDER — CINACALCET HCL 30 MG PO TABS
60.0000 mg | ORAL_TABLET | Freq: Once | ORAL | Status: DC
  Filled 2018-10-24: qty 2

## 2018-10-24 NOTE — Progress Notes (Signed)
HD tx start    10/24/18 1305  Vital Signs  Pulse Rate 76  Pulse Rate Source Monitor  Resp 17  BP (!) 170/86  BP Location Right Arm  BP Method Automatic  Patient Position (if appropriate) Lying  Oxygen Therapy  SpO2 95 %  O2 Device Nasal Cannula  O2 Flow Rate (L/min) 2 L/min  During Hemodialysis Assessment  Blood Flow Rate (mL/min) 400 mL/min  Arterial Pressure (mmHg) -170 mmHg  Venous Pressure (mmHg) 200 mmHg  Transmembrane Pressure (mmHg) 60 mmHg  Ultrafiltration Rate (mL/min) 1500 mL/min  Dialysate Flow Rate (mL/min) 600 ml/min  Conductivity: Machine  13.8  HD Safety Checks Performed Yes  Dialysis Fluid Bolus Normal Saline  Bolus Amount (mL) 250 mL  Intra-Hemodialysis Comments Tx initiated

## 2018-10-24 NOTE — ED Notes (Signed)
Pt finished with dialysis - per report they pulled off 4 liters with a last bp of 142/73. Once pt came back to ed he was given graham crackers and a drink. While registration was attempting to place him back in the ed he stated to registration that he was leaving and left. Charge nurse made aware.

## 2018-10-24 NOTE — Progress Notes (Signed)
Post HD assessment. Pt tolerated tx well without c/o or complication. Net UF 4002, goal met.    10/24/18 1610  Vital Signs  Temp 98.4 F (36.9 C)  Temp Source Oral  Pulse Rate 76  Pulse Rate Source Monitor  Resp 15  BP 139/65  BP Location Right Arm  BP Method Automatic  Patient Position (if appropriate) Lying  Oxygen Therapy  SpO2 96 %  O2 Device Nasal Cannula  O2 Flow Rate (L/min) 2 L/min  Dialysis Weight  Weight 67.5 kg  Type of Weight Post-Dialysis  Post-Hemodialysis Assessment  Rinseback Volume (mL) 250 mL  KECN 68.1 V  Dialyzer Clearance Lightly streaked  Duration of HD Treatment -hour(s) 3 hour(s)  Hemodialysis Intake (mL) 500 mL  UF Total -Machine (mL) 4502 mL  Net UF (mL) 4002 mL  Tolerated HD Treatment Yes  AVG/AVF Arterial Site Held (minutes) 10 minutes  AVG/AVF Venous Site Held (minutes) 10 minutes  Education / Care Plan  Dialysis Education Provided Yes  Documented Education in Care Plan Yes

## 2018-10-24 NOTE — Progress Notes (Signed)
Post HD assessment    10/24/18 1609  Neurological  Level of Consciousness Alert  Orientation Level Oriented X4  Respiratory  Respiratory Pattern Regular;Unlabored  Chest Assessment Chest expansion symmetrical  Cardiac  Pulse Regular  ECG Monitor Yes  Cardiac Rhythm NSR  Vascular  R Radial Pulse +2  L Radial Pulse +2  Integumentary  Integumentary (WDL) X  Skin Color Appropriate for ethnicity  Musculoskeletal  Musculoskeletal (WDL) WDL  GU Assessment  Genitourinary (WDL) X  Genitourinary Symptoms  (HD)  Psychosocial  Psychosocial (WDL) X  Patient Behaviors Cooperative;Anxious;Agitated;Restless

## 2018-10-24 NOTE — Progress Notes (Signed)
HD tx end    10/24/18 1608  Vital Signs  Pulse Rate 76  Pulse Rate Source Monitor  Resp 16  BP (!) 152/65  BP Location Right Arm  BP Method Automatic  Patient Position (if appropriate) Lying  Oxygen Therapy  SpO2 98 %  O2 Device Nasal Cannula  O2 Flow Rate (L/min) 2 L/min  During Hemodialysis Assessment  Dialysis Fluid Bolus Normal Saline  Bolus Amount (mL) 250 mL  Intra-Hemodialysis Comments Tx completed

## 2018-10-24 NOTE — ED Notes (Signed)
Pt in [place]

## 2018-10-24 NOTE — Progress Notes (Signed)
[facility]  ROUNDING NOTE   Subjective:   [name] presents to the ED with shortness of breath and pulmonary edema. Patient missed his outpatient hemodialysis treatment on Friday due to work. Then he states he was rescheduled for yesterday, Saturday, however he states he overslept.   Patient's last hemodialysis treatment was 2/12 where he left at 67kg.   Patient states he is working in [place] now and it is hard for him to get dialysis.   Objective:  Vital signs in last 24 hours:  Temp:  [97.7 F (36.5 C)] 97.7 F (36.5 C) (02/16 0620) Pulse Rate:  [74-86] 74 (02/16 1030) Resp:  [14-24] 16 (02/16 1030) BP: (147-178)/(75-108) 169/108 (02/16 1030) SpO2:  [92 %-100 %] 97 % (02/16 1030) Weight:  [70.8 kg] 70.8 kg (02/16 0620)  Weight change:  Filed Weights   10/24/18 0620  Weight: 70.8 kg    Intake/Output: No intake/output data recorded.   Intake/Output this shift:  No intake/output data recorded.  Physical Exam: General: NAD,   Head: Normocephalic, atraumatic. Moist oral mucosal membranes  Eyes: Anicteric, PERRL  Neck: Supple, trachea midline  Lungs:  Bilateral crackles  Heart: Regular rate and rhythm  Abdomen:  Soft, nontender,   Extremities:  trace peripheral edema.  Neurologic: Nonfocal, moving all four extremities  Skin: No lesions  Access: Left AVF anneurysmal    Basic Metabolic Panel: Recent Labs  Lab 10/24/18 0642  NA 138  K 5.4*  CL 102  CO2 23  GLUCOSE 134*  BUN 90*  CREATININE 15.45*  CALCIUM 8.5*    Liver Function Tests: Recent Labs  Lab 10/24/18 0642  AST 26  ALT 20  ALKPHOS 122  BILITOT 0.8  PROT 6.8  ALBUMIN 3.2*   No results for input(s): LIPASE, AMYLASE in the last 168 hours. No results for input(s): AMMONIA in the last 168 hours.  CBC: Recent Labs  Lab 10/24/18 0642  WBC 6.0  NEUTROABS 4.4  HGB 9.3*  HCT 29.9*  MCV 96.8  PLT 244    Cardiac Enzymes: Recent Labs  Lab 10/24/18 0642  10/24/18 0848  TROPONINI 0.06* 0.06*    BNP: Invalid input(s): POCBNP  CBG: No results for input(s): GLUCAP in the last 168 hours.  Microbiology: Results for orders placed or performed during the hospital encounter of 05/16/18  MRSA PCR Screening     Status: None   Collection Time: 05/19/18  5:45 AM  Result Value Ref Range Status   MRSA by PCR NEGATIVE NEGATIVE Final    Comment:        The GeneXpert MRSA Assay (FDA approved for NASAL specimens only), is one component of a comprehensive MRSA colonization surveillance program. It is not intended to diagnose MRSA infection nor to guide or monitor treatment for MRSA infections. Performed at [facility], [address] [place], [place] [address]     Coagulation Studies: No results for input(s): LABPROT, INR in the last 72 hours.  Urinalysis: No results for input(s): COLORURINE, LABSPEC, PHURINE, GLUCOSEU, HGBUR, BILIRUBINUR, KETONESUR, PROTEINUR, UROBILINOGEN, NITRITE, LEUKOCYTESUR in the last 72 hours.  Invalid input(s): APPERANCEUR    Imaging: Dg Chest Portable 1 View  Result Date: 10/24/2018 CLINICAL DATA:  Shortness of breath EXAM: PORTABLE CHEST 1 VIEW COMPARISON:  September 22, 2018 FINDINGS: There is cardiomegaly with pulmonary vascularity normal. No edema or consolidation. No adenopathy. There is a stent in the left axillary region. No bone lesions. IMPRESSION: Stable cardiomegaly.  No edema or consolidation. Aortic Atherosclerosis (  ICD10-I70.0). Electronically Signed   By: Lowella Grip III M.D.   On: 10/24/2018 07:45     Medications:    . Chlorhexidine Gluconate Cloth  6 each Topical Q0600     Assessment/ Plan:  [name] is a 58 y.o. black male with end stage renal disease on hemodialysis, hypertension, diabetes mellitus type II, congestive heart failure  [facility] 67kg left AVF  1. End Stage Renal Disease: with pulmonary edema on presentation. Last hemodialysis was 2/12.   Emergent hemodialysis treatment today. Orders prepared.  Ultrafiltration goal of 4 liters.   2. Hypertension: elevated at 169/108. Home regimen of amlodipine and metoprolol.   3. Anemia of chronic kidney disease: hemoglobin 9.3 - EPO with HD treatment  4. Secondary Hyperparathyroidism: PTH not well controlled, 3213 on 2/12. With hyperphosphatemia and hypocalcemia - Sevelamer with meals.  - Cinacalcet on MWF with HD treatment, will give dose today.    LOS: 0 Jeremy [name] 2/16/202012:47 PM

## 2018-10-24 NOTE — ED Notes (Signed)
Pt to dialysis.

## 2018-10-24 NOTE — Progress Notes (Signed)
Pre HD assessment    10/24/18 1230  Vital Signs  Temp 97.8 F (36.6 C)  Temp Source Oral  Pulse Rate 72  Pulse Rate Source Monitor  Resp (!) 25  BP (!) 159/79  BP Location Right Arm  BP Method Automatic  Patient Position (if appropriate) Lying  Oxygen Therapy  SpO2 96 %  O2 Device Nasal Cannula  O2 Flow Rate (L/min) 2 L/min  Pain Assessment  Pain Scale 0-10  Pain Score 0  Dialysis Weight  Weight 70.8 kg  Type of Weight Pre-Dialysis  Time-Out for Hemodialysis  What Procedure? HD  Pt Identifiers(min of two) First/Last Name;MRN/Account#  Correct Site? Yes  Correct Side? Yes  Correct Procedure? Yes  Consents Verified? Yes  Rad Studies Available? N/A  Safety Precautions Reviewed? Yes  Engineer, civil (consulting) Number  ([id number])  Station Number 1  UF/Alarm Test Passed  Conductivity: Meter 13.6  Conductivity: Machine  13.7  pH 7.6  Reverse Osmosis main  Normal Saline Lot Number [id number]  Dialyzer Lot Number [id number]  Disposable Set Lot Number [id number]  Machine Temperature 98.6 F (37 C)  Musician and Audible Yes  Blood Lines Intact and Secured Yes  Pre Treatment Patient Checks  Vascular access used during treatment Fistula  Hepatitis B Surface Antigen Results Negative  Date Hepatitis B Surface Antigen Drawn 07/27/18  Hepatitis B Surface Antibody 53 (>10)  Date Hepatitis B Surface Antibody Drawn 07/27/18  Hemodialysis Consent Verified Yes  Hemodialysis Standing Orders Initiated Yes  ECG (Telemetry) Monitor On Yes  Prime Ordered Normal Saline  Length of  DialysisTreatment -hour(s) 3 Hour(s)  Dialyzer Elisio 17H NR  Dialysate 2K, 2.5 Ca  Dialysis Anticoagulant None  Dialysate Flow Ordered 600  Blood Flow Rate Ordered 400 mL/min  Ultrafiltration Goal 3.5 Liters  Dialysis Blood Pressure Support Ordered Normal Saline  Education / Care Plan  Dialysis Education Provided Yes  Documented Education in Care Plan Yes

## 2018-10-24 NOTE — ED Notes (Signed)
Pt report received. Pt is awaiting arrival of the dialysis nurse.

## 2018-10-24 NOTE — ED Notes (Addendum)
Dr. Larna Daughters at bedside

## 2018-10-24 NOTE — ED Notes (Signed)
Dr. Cinda Quest informed pts SpO2 87 % on room air. Pt was placed on 2 L via [place] with improvement in SpO2

## 2018-10-24 NOTE — Progress Notes (Signed)
Pre HD assessment    10/24/18 1231  Neurological  Level of Consciousness Alert  Orientation Level Oriented X4  Respiratory  Respiratory Pattern Regular;Unlabored  Chest Assessment Chest expansion symmetrical  Cardiac  Pulse Regular  ECG Monitor Yes  Cardiac Rhythm NSR  Vascular  R Radial Pulse +2  L Radial Pulse +2  Integumentary  Integumentary (WDL) X  Skin Color Appropriate for ethnicity  Musculoskeletal  Musculoskeletal (WDL) WDL  GU Assessment  Genitourinary (WDL) X  Genitourinary Symptoms  (HD)  Psychosocial  Psychosocial (WDL) X  Patient Behaviors Cooperative;Anxious;Restless

## 2018-10-24 NOTE — ED Provider Notes (Signed)
[facility] Emergency Department Provider Note   ____________________________________________   First MD Initiated Contact with Patient 10/24/18 [phone]     (approximate)  I have reviewed the triage vital signs and the nursing notes.   HISTORY  Chief Complaint Shortness of Breath    HPI Jeremy [name] is a 58 y.o. male who gets dialysis Monday Wednesday and Friday.  He missed dialysis on Friday because of a job interview that ran over.  He was doing okay up until last night when he began getting short of breath when he laid down.  He could not lie down all night long.  He came in the hospital this morning.  He has no fever or coughing chest tightness or any other symptoms just shortness of breath.  He cannot walk very far without getting short of breath.  Patient makes very little urine   Past Medical History:  Diagnosis Date  . Bronchitis   . Hypertension   . Renal disorder    kidney failure, dialysis    Patient Active Problem List   Diagnosis Date Noted  . Pulmonary edema 09/22/2018  . Hyperkalemia 05/16/2018    Past Surgical History:  Procedure Laterality Date  . DIALYSIS FISTULA CREATION      Prior to Admission medications   Medication Sig Start Date End Date Taking? Authorizing Provider  amLODipine (NORVASC) 10 MG tablet Take 10 mg by mouth daily. 03/29/18   [provider]  chlorpheniramine-HYDROcodone (TUSSIONEX PENNKINETIC ER) 10-8 MG/5ML SUER Take 5 mLs by mouth every 12 (twelve) hours as needed for cough. Patient not taking: Reported on 09/22/2018 09/18/18   Duanne Guess, PA-C  gabapentin (NEURONTIN) 300 MG capsule Take 1 capsule (300 mg total) by mouth daily as needed. 05/19/18 09/22/18  Henreitta Leber, MD  metoprolol tartrate (LOPRESSOR) 25 MG tablet Take 25 mg by mouth daily. 03/29/18   [provider]  Multiple Vitamins-Iron (MULTI-DAY PLUS IRON) TABS Take 1 tablet by mouth daily.    [provider]    predniSONE (DELTASONE) 10 MG tablet Take 1 tablet (10 mg total) by mouth daily. Label  & dispense according to the schedule below.  6 tablets day one, then 5 table day 2, then 4 tablets day 3, then 3 tablets day 4, 2 tablets day 5, then 1 tablet day 6, then stop 09/23/18   Saundra Shelling, MD  sevelamer carbonate (RENVELA) 800 MG tablet Take 1,600 mg by mouth 3 (three) times daily with meals. 03/02/18   [provider]  VELPHORO 500 MG chewable tablet Chew 500 mg by mouth 6 (six) times daily. 05/27/18   [provider]    Allergies Patient has no known allergies.  Family History  Problem Relation Age of Onset  . Hypertension Mother     Social History Social History   Tobacco Use  . Smoking status: Current Every Day Smoker    Packs/day: 1.00    Types: Cigarettes  . Smokeless tobacco: Never Used  Substance Use Topics  . Alcohol use: Never    Frequency: Never  . Drug use: Yes    Types: Marijuana    Review of Systems  Constitutional: No fever/chills Eyes: No visual changes. ENT: No sore throat. Cardiovascular: Denies chest pain. Respiratory:shortness of breath. Gastrointestinal: No abdominal pain.  No nausea, no vomiting.  No diarrhea.  No constipation. Genitourinary: Negative for dysuria. Musculoskeletal: Negative for back pain. Skin: Negative for rash. Neurological: Negative for headaches, focal weakness   ____________________________________________  PHYSICAL EXAM:  VITAL SIGNS: ED Triage Vitals [10/24/18 0620]  Enc Vitals Group     BP (!) 178/77     Pulse Rate 86     Resp (!) 24     Temp 97.7 F (36.5 C)     Temp Source Oral     SpO2 92 %     Weight 156 lb (70.8 kg)     Height 5\' 6"  (1.676 m)     Head Circumference      Peak Flow      Pain Score 0     Pain Loc      Pain Edu?      Excl. in [place]?     Constitutional: Alert and oriented. Well appearing and in no acute distress. Eyes: Conjunctivae are normal. PERRL. EOMI. Head:  Atraumatic. Nose: No congestion/rhinnorhea. Mouth/Throat: Mucous membranes are moist.  Oropharynx non-erythematous. Neck: No stridor.  Patient with JVD to the angle of cardiovascular: Normal rate, regular rhythm. Grossly normal heart sounds.  Good peripheral circulation. Respiratory: Normal respiratory effort.  No retractions. Lungs CTAB! Gastrointestinal: Soft and nontender. No distention. No abdominal bruits. No CVA tenderness. Musculoskeletal: No lower extremity tenderness nor edema.  No joint effusions. Neurologic:  Normal speech and language. No gross focal neurologic deficits are appreciated. Skin:  Skin is warm, dry and intact. No rash noted.   ____________________________________________   LABS (all labs ordered are listed, but only abnormal results are displayed)  Labs Reviewed  COMPREHENSIVE METABOLIC PANEL - Abnormal; Notable for the following components:      Result Value   Potassium 5.4 (*)    Glucose, Bld 134 (*)    BUN 90 (*)    Creatinine, Ser 15.45 (*)    Calcium 8.5 (*)    Albumin 3.2 (*)    GFR calc non Af Amer 3 (*)    GFR calc Af Amer 4 (*)    All other components within normal limits  TROPONIN I - Abnormal; Notable for the following components:   Troponin I 0.06 (*)    All other components within normal limits  CBC WITH DIFFERENTIAL/PLATELET - Abnormal; Notable for the following components:   RBC 3.09 (*)    Hemoglobin 9.3 (*)    HCT 29.9 (*)    RDW 16.3 (*)    All other components within normal limits  BRAIN NATRIURETIC PEPTIDE - Abnormal; Notable for the following components:   B Natriuretic Peptide 3,562.0 (*)    All other components within normal limits  TROPONIN I - Abnormal; Notable for the following components:   Troponin I 0.06 (*)    All other components within normal limits   ____________________________________________  EKG EKG read and interpreted by me shows normal sinus rhythm rate of 78 normal axis no acute ST-T  changes  ____________________________________________  RADIOLOGY  ED MD interpretation: X-ray read by radiology reviewed by me to me shows congestive failure.  Patient's BNP is over 3000  Official radiology report(s): Dg Chest Portable 1 View  Result Date: 10/24/2018 CLINICAL DATA:  Shortness of breath EXAM: PORTABLE CHEST 1 VIEW COMPARISON:  September 22, 2018 FINDINGS: There is cardiomegaly with pulmonary vascularity normal. No edema or consolidation. No adenopathy. There is a stent in the left axillary region. No bone lesions. IMPRESSION: Stable cardiomegaly.  No edema or consolidation. Aortic Atherosclerosis (ICD10-I70.0). Electronically Signed   By: Lowella Grip III M.D.   On: 10/24/2018 07:45    ____________________________________________   PROCEDURES  Procedure(s) performed:  Procedures  Critical Care performed:   ____________________________________________   INITIAL IMPRESSION / ASSESSMENT AND PLAN / ED COURSE  Patient with 6 mildly elevated potassium.  His troponin is 0.06 slightly higher than usual but stable.  We will get him to dialysis.          ____________________________________________   FINAL CLINICAL IMPRESSION(S) / ED DIAGNOSES  Final diagnoses:  Congestive heart failure, unspecified HF chronicity, unspecified heart failure type [facility])     ED Discharge Orders    None       Note:  This document was prepared using Dragon voice recognition software and may include unintentional dictation errors.    Nena Polio, MD 10/24/18 [phone]

## 2018-10-24 NOTE — ED Triage Notes (Signed)
Patient ambulatory to triage with complaints of SOB that started today.  Pt missed dialysis on Friday because of a job interview running long and [place] was unable to dialyze pt afterwards.    Pt reports SOB worse with exertion or lying down.  Last dialysis this past Wednesday. Pt out of breath after walking into triage.

## 2018-11-04 ENCOUNTER — Emergency Department
Admission: EM | Admit: 2018-11-04 | Discharge: 2018-11-05 | Discharge status: Against Medical Advice | Payer: Medicare Other | Attending: Emergency Medicine | Admitting: Emergency Medicine

## 2018-11-04 ENCOUNTER — Emergency Department: Payer: Medicare Other

## 2018-11-04 DIAGNOSIS — F17210 Nicotine dependence, cigarettes, uncomplicated: Secondary | ICD-10-CM | POA: Insufficient documentation

## 2018-11-04 DIAGNOSIS — R0602 Shortness of breath: Principal | ICD-10-CM

## 2018-11-04 DIAGNOSIS — N186 End stage renal disease: Secondary | ICD-10-CM | POA: Insufficient documentation

## 2018-11-04 DIAGNOSIS — Z8249 Family history of ischemic heart disease and other diseases of the circulatory system: Secondary | ICD-10-CM | POA: Insufficient documentation

## 2018-11-04 DIAGNOSIS — Z992 Dependence on renal dialysis: Secondary | ICD-10-CM | POA: Diagnosis not present

## 2018-11-04 DIAGNOSIS — E875 Hyperkalemia: Secondary | ICD-10-CM

## 2018-11-04 DIAGNOSIS — I120 Hypertensive chronic kidney disease with stage 5 chronic kidney disease or end stage renal disease: Secondary | ICD-10-CM | POA: Insufficient documentation

## 2018-11-04 DIAGNOSIS — Z79899 Other long term (current) drug therapy: Secondary | ICD-10-CM | POA: Insufficient documentation

## 2018-11-04 DIAGNOSIS — E8770 Fluid overload, unspecified: Secondary | ICD-10-CM | POA: Insufficient documentation

## 2018-11-04 LAB — TROPONIN I: Troponin I: 0.05 ng/mL (ref <0.03)

## 2018-11-04 LAB — BASIC METABOLIC PANEL
Anion gap: 17 — ABNORMAL HIGH (ref 5–15)
BUN: 85 mg/dL — ABNORMAL HIGH (ref 6–20)
CO2: 19 mmol/L — ABNORMAL LOW (ref 22–32)
Calcium: 8.7 mg/dL — ABNORMAL LOW (ref 8.9–10.3)
Chloride: 104 mmol/L (ref 98–111)
Creatinine, Ser: 13.91 mg/dL — ABNORMAL HIGH (ref 0.61–1.24)
GFR calc Af Amer: 4 mL/min — ABNORMAL LOW (ref >60)
GFR calc non Af Amer: 3 mL/min — ABNORMAL LOW (ref >60)
Glucose, Bld: 118 mg/dL — ABNORMAL HIGH (ref 70–99)
Potassium: 6.8 mmol/L (ref 3.5–5.1)
Sodium: 140 mmol/L (ref 135–145)

## 2018-11-04 LAB — CBC
HCT: 33 % — ABNORMAL LOW (ref 39.0–52.0)
Hemoglobin: 9.8 g/dL — ABNORMAL LOW (ref 13.0–17.0)
MCH: 30.2 pg (ref 26.0–34.0)
MCHC: 29.7 g/dL — ABNORMAL LOW (ref 30.0–36.0)
MCV: 101.5 fL — ABNORMAL HIGH (ref 80.0–100.0)
Platelets: 284 10*3/uL (ref 150–400)
RBC: 3.25 MIL/uL — ABNORMAL LOW (ref 4.22–5.81)
RDW: 17.1 % — ABNORMAL HIGH (ref 11.5–15.5)
WBC: 6.9 10*3/uL (ref 4.0–10.5)
nRBC: 0 % (ref 0.0–0.2)

## 2018-11-04 LAB — GLUCOSE, CAPILLARY
Glucose-Capillary: 39 mg/dL — CL (ref 70–99)
Glucose-Capillary: 57 mg/dL — ABNORMAL LOW (ref 70–99)
Glucose-Capillary: 81 mg/dL (ref 70–99)

## 2018-11-04 MED ORDER — SODIUM CHLORIDE 0.9 % IV SOLN: 100.0000 mL | INTRAVENOUS | Status: DC | PRN

## 2018-11-04 MED ORDER — DEXTROSE 50 % IV SOLN
INTRAVENOUS | Status: AC
  Filled 2018-11-04: qty 50

## 2018-11-04 MED ORDER — ALTEPLASE 2 MG IJ SOLR: 2.0000 mg | Freq: Once | INTRAMUSCULAR | Status: DC | PRN

## 2018-11-04 MED ORDER — HEPARIN SODIUM (PORCINE) 1000 UNIT/ML DIALYSIS: 1000.0000 [IU] | INTRAMUSCULAR | Status: DC | PRN

## 2018-11-04 MED ORDER — SODIUM CHLORIDE 0.9% FLUSH: 3.0000 mL | Freq: Once | INTRAVENOUS | Status: DC

## 2018-11-04 MED ORDER — LIDOCAINE-PRILOCAINE 2.5-2.5 % EX CREA: 1.0000 "application " | TOPICAL_CREAM | CUTANEOUS | Status: DC | PRN

## 2018-11-04 MED ORDER — DEXTROSE 50 % IV SOLN
25.0000 g | Freq: Once | INTRAVENOUS | Status: AC
  Administered 2018-11-04: 25 g via INTRAVENOUS
  Filled 2018-11-04: qty 50

## 2018-11-04 MED ORDER — PENTAFLUOROPROP-TETRAFLUOROETH EX AERO: 1.0000 "application " | INHALATION_SPRAY | CUTANEOUS | Status: DC | PRN

## 2018-11-04 MED ORDER — DEXTROSE 50 % IV SOLN
25.0000 g | Freq: Once | INTRAVENOUS | Status: AC
  Administered 2018-11-04: 25 g via INTRAVENOUS

## 2018-11-04 MED ORDER — INSULIN ASPART 100 UNIT/ML ~~LOC~~ SOLN
10.0000 [IU] | Freq: Once | SUBCUTANEOUS | Status: AC
  Administered 2018-11-04: 10 [IU] via INTRAVENOUS
  Filled 2018-11-04: qty 1

## 2018-11-04 MED ORDER — LIDOCAINE HCL (PF) 1 % IJ SOLN: 5.0000 mL | INTRAMUSCULAR | Status: DC | PRN

## 2018-11-04 MED ORDER — CHLORHEXIDINE GLUCONATE CLOTH 2 % EX PADS: 6.0000 | MEDICATED_PAD | Freq: Every day | CUTANEOUS | Status: DC

## 2018-11-04 NOTE — ED Notes (Addendum)
Introduced to pt, placed on cardiac, NIBP, and SpO2 monitoring at this time. PT report that he missed dialysis on Wednesday r/t work. No he is feeling SHOB, occasional CP when lying flat. States "I just need dialysis". Awaits MD for evaluation. Call bell within reach

## 2018-11-04 NOTE — ED Notes (Signed)
Critical result  K+  6.8 Troponin  0.05  MD made aware

## 2018-11-04 NOTE — ED Notes (Signed)
Md aware of repeat BG, patient drank 4 cups of Grape juice and is eating crackers at this time. Remains Alert and oriented. Ready for transport to dialysis.

## 2018-11-04 NOTE — ED Provider Notes (Addendum)
[facility] Emergency Department Provider Note  Time seen: 8:54 PM  I have reviewed the triage vital signs and the nursing notes.   HISTORY  Chief Complaint Shortness of Breath   HPI Jeremy [name] is a 58 y.o. 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  According to the patient he missed his dialysis yesterday due to getting out of work late.  Patient states today he was at work and began feeling very short of breath so he came to the emergency department.  Patient is currently satting 91 to 92% on room air, no home O2 requirement.  Continues to have a cough states he was treated for bronchitis earlier in the month.  No other medical complaints at this time.   Past Medical History:  Diagnosis Date  . Bronchitis   . Hypertension   . Renal disorder    kidney failure, dialysis    Patient Active Problem List   Diagnosis Date Noted  . Pulmonary edema 09/22/2018  . Hyperkalemia 05/16/2018    Past Surgical History:  Procedure Laterality Date  . DIALYSIS FISTULA CREATION      Prior to Admission medications   Medication Sig Start Date End Date Taking? Authorizing Provider  amLODipine (NORVASC) 10 MG tablet Take 10 mg by mouth daily. 03/29/18   [provider]  chlorpheniramine-HYDROcodone (TUSSIONEX PENNKINETIC ER) 10-8 MG/5ML SUER Take 5 mLs by mouth every 12 (twelve) hours as needed for cough. Patient not taking: Reported on 09/22/2018 09/18/18   Duanne Guess, PA-C  gabapentin (NEURONTIN) 300 MG capsule Take 1 capsule (300 mg total) by mouth daily as needed. 05/19/18 10/24/18  Henreitta Leber, MD  metoprolol tartrate (LOPRESSOR) 25 MG tablet Take 25 mg by mouth daily. 03/29/18   [provider]  Multiple Vitamins-Iron (MULTI-DAY PLUS IRON) TABS Take 1 tablet by mouth daily.    [provider]  predniSONE (DELTASONE) 10 MG tablet  Take 1 tablet (10 mg total) by mouth daily. Label  & dispense according to the schedule below.  6 tablets day one, then 5 table day 2, then 4 tablets day 3, then 3 tablets day 4, 2 tablets day 5, then 1 tablet day 6, then stop Patient not taking: Reported on 10/24/2018 09/23/18   Saundra Shelling, MD  sevelamer carbonate (RENVELA) 800 MG tablet Take 1,600 mg by mouth 3 (three) times daily with meals. 03/02/18   [provider]  VELPHORO 500 MG chewable tablet Chew 500 mg by mouth 6 (six) times daily. 05/27/18   [provider]    No Known Allergies  Family History  Problem Relation Age of Onset  . Hypertension Mother     Social History Social History   Tobacco Use  . Smoking status: Current Every Day Smoker    Packs/day: 1.00    Types: Cigarettes  . Smokeless tobacco: Never Used  Substance Use Topics  . Alcohol use: Never    Frequency: Never  . Drug use: Yes    Types: Marijuana    Review of Systems Constitutional: Negative for fever. Cardiovascular: Negative for chest pain. Respiratory: Positive for shortness of breath Gastrointestinal: Negative for abdominal pain Genitourinary: On dialysis Musculoskeletal: Negative for musculoskeletal complaints Skin: Negative for skin complaints  Neurological: Negative for headache All other ROS negative  ____________________________________________   PHYSICAL EXAM:  VITAL SIGNS: ED Triage Vitals  Enc Vitals Group     BP  11/04/18 1907 (!) 162/73     Pulse Rate 11/04/18 1907 93     Resp 11/04/18 1907 (!) 25     Temp 11/04/18 1907 97.7 F (36.5 C)     Temp Source 11/04/18 1907 Oral     SpO2 11/04/18 1907 92 %     Weight 11/04/18 1910 158 lb (71.7 kg)     Height 11/04/18 1910 5\' 6"  (1.676 m)     Head Circumference --      Peak Flow --      Pain Score 11/04/18 1910 9     Pain Loc --      Pain Edu? --      Excl. in [place]? --    Constitutional: Alert and oriented. Well appearing and in no distress. Eyes: Normal  exam ENT   Head: Normocephalic and atraumatic.   Mouth/Throat: Mucous membranes are moist. Cardiovascular: Normal rate, regular rhythm.  Respiratory: Normal respiratory effort without tachypnea nor retractions. Breath sounds are clear.  Mild cough. Gastrointestinal: Soft and nontender. No distention.   Musculoskeletal: Nontender with normal range of motion in all extremities.  Neurologic:  Normal speech and language. No gross focal neurologic deficits  Skin:  Skin is warm, dry and intact.  Psychiatric: Mood and affect are normal.   ____________________________________________    EKG  EKG viewed and interpreted by myself shows a sinus rhythm at 77 bpm with a narrow QRS, normal axis, normal intervals, nonspecific ST changes.  Slight peaking of T waves in V5 and V6.  ____________________________________________    RADIOLOGY  Chest x-ray shows cardiomegaly with vascular congestion.  ____________________________________________   INITIAL IMPRESSION / ASSESSMENT AND PLAN / ED COURSE  Pertinent labs & imaging results that were available during my care of the patient were reviewed by me and considered in my medical decision making (see chart for details).  Patient presents to the emergency department for shortness of breath after missing dialysis yesterday.  Did go to dialysis on Monday per patient but has not been since.  Patient is satting 90 to 91% currently on room air.  Differential would include volume overload, metabolic abnormality, ACS, pneumonia, pulmonary edema.  Patient's x-ray shows cardiomegaly with vascular congestion, lab work shows a slight troponin elevation likely due to chronic renal disease.  Patient's potassium however is 6.8 consistent with hyperkalemia.  Mild peaked T waves on EKG.  We will treat with insulin glucose.  I discussed patient with Dr. Holley Raring of nephrology, he will arrange for urgent dialysis overnight tonight.  Patient will be admitted to the  hospital service.  CRITICAL CARE Performed by: Harvest Dark   Total critical care time: 30 minutes  Critical care time was exclusive of separately billable procedures and treating other patients.  Critical care was necessary to treat or prevent imminent or life-threatening deterioration.  Critical care was time spent personally by me on the following activities: development of treatment plan with patient and/or surrogate as well as nursing, discussions with consultants, evaluation of patient's response to treatment, examination of patient, obtaining history from patient or surrogate, ordering and performing treatments and interventions, ordering and review of laboratory studies, ordering and review of radiographic studies, pulse oximetry and re-evaluation of patient's condition.   Patient has made it very clear that he plans on leaving AMA after his dialysis is performed.  Had a discussion with the patient and attempted to convince the patient to stay in the emergency department and be admitted to the hospital for dialysis and further  monitoring.  Patient states he has to be at work at 6 AM he is willing to stay for dialysis but states he is going to sign out AMA after dialysis.  ____________________________________________   FINAL CLINICAL IMPRESSION(S) / ED DIAGNOSES  Hyperkalemia Fluid overload   Harvest Dark, MD 11/04/18 2143    Harvest Dark, MD 11/04/18 2227

## 2018-11-04 NOTE — ED Triage Notes (Signed)
Patient is a M/W/F dialysis patient that missed dialysis on Wednesday. Patient c/o SOB, chest discomfort, and lower extremity pain/swelling. Patient.

## 2018-11-05 DIAGNOSIS — R0602 Shortness of breath: Secondary | ICD-10-CM | POA: Diagnosis not present

## 2018-11-05 LAB — RENAL FUNCTION PANEL
Albumin: 3.3 g/dL — ABNORMAL LOW (ref 3.5–5.0)
Anion gap: 12 (ref 5–15)
BUN: 89 mg/dL — ABNORMAL HIGH (ref 6–20)
CHLORIDE: 106 mmol/L (ref 98–111)
CO2: 22 mmol/L (ref 22–32)
Calcium: 8.3 mg/dL — ABNORMAL LOW (ref 8.9–10.3)
Creatinine, Ser: 13.55 mg/dL — ABNORMAL HIGH (ref 0.61–1.24)
GFR calc Af Amer: 4 mL/min — ABNORMAL LOW (ref >60)
GFR calc non Af Amer: 4 mL/min — ABNORMAL LOW (ref >60)
Glucose, Bld: 82 mg/dL (ref 70–99)
POTASSIUM: 5.9 mmol/L — AB (ref 3.5–5.1)
Phosphorus: 8.6 mg/dL — ABNORMAL HIGH (ref 2.5–4.6)
Sodium: 140 mmol/L (ref 135–145)

## 2018-11-05 LAB — CBC
HEMATOCRIT: 27.9 % — AB (ref 39.0–52.0)
Hemoglobin: 8.7 g/dL — ABNORMAL LOW (ref 13.0–17.0)
MCH: 29.9 pg (ref 26.0–34.0)
MCHC: 31.2 g/dL (ref 30.0–36.0)
MCV: 95.9 fL (ref 80.0–100.0)
NRBC: 0 % (ref 0.0–0.2)
Platelets: 299 10*3/uL (ref 150–400)
RBC: 2.91 MIL/uL — ABNORMAL LOW (ref 4.22–5.81)
RDW: 17.1 % — ABNORMAL HIGH (ref 11.5–15.5)
WBC: 6.8 10*3/uL (ref 4.0–10.5)

## 2018-11-05 LAB — PHOSPHORUS: Phosphorus: 8.6 mg/dL — ABNORMAL HIGH (ref 2.5–4.6)

## 2018-11-05 NOTE — Progress Notes (Signed)
Pre HD assessment    11/04/18 2352  Vital Signs  Temp 97.8 F (36.6 C)  Temp Source Oral  Pulse Rate 82  Pulse Rate Source Monitor  Resp (!) 26  BP (!) 173/88  BP Location Right Arm  BP Method Automatic  Patient Position (if appropriate) Lying  Oxygen Therapy  SpO2 96 %  O2 Device Room Air  Dialysis Weight  Weight 71.7 kg  Type of Weight Pre-Dialysis  Time-Out for Hemodialysis  What Procedure? HD  Pt Identifiers(min of two) First/Last Name;MRN/Account#  Correct Site? Yes  Correct Side? Yes  Correct Procedure? Yes  Consents Verified? Yes  Rad Studies Available? N/A  Safety Precautions Reviewed? Yes  Engineer, civil (consulting) Number  ([id number])  Station Number 4  UF/Alarm Test Passed  Conductivity: Meter 13.6  Conductivity: Machine  13.7  pH 7.6  Reverse Osmosis WRO #1  Normal Saline Lot Number [id number]  Dialyzer Lot Number [id number]  Disposable Set Lot Number [id number]  Machine Temperature 98.6 F (37 C)  Musician and Audible Yes  Blood Lines Intact and Secured Yes  Pre Treatment Patient Checks  Vascular access used during treatment Fistula  Patient is receiving dialysis in a chair Yes  Hepatitis B Surface Antigen Results Negative  Date Hepatitis B Surface Antigen Drawn 05/18/18  Hepatitis B Surface Antibody  (>10)  Date Hepatitis B Surface Antibody Drawn 05/18/18  Hemodialysis Consent Verified Yes  Hemodialysis Standing Orders Initiated Yes  ECG (Telemetry) Monitor On Yes  Prime Ordered None  Length of  DialysisTreatment -hour(s) 3.5 Hour(s)  Dialyzer Elisio 17H NR  Dialysate 1K  Dialysis Anticoagulant None  Dialysate Flow Ordered 800  Blood Flow Rate Ordered 400 mL/min  Ultrafiltration Goal 2.5 Liters  Pre Treatment Labs Renal panel;CBC;Phosphorus  Dialysis Blood Pressure Support Ordered Normal Saline  Education / Care Plan  Dialysis Education Provided Yes  Documented Education in Care Plan Yes

## 2018-11-05 NOTE — Progress Notes (Signed)
Post HD assessment   11/05/18 0309  Neurological  Level of Consciousness Responds to Voice  Orientation Level Oriented X4  Respiratory  Respiratory Pattern Regular;Unlabored;Dyspnea with exertion  Chest Assessment Chest expansion symmetrical  Cardiac  Pulse Regular  ECG Monitor Yes  Cardiac Rhythm NSR  Ectopy Unifocal PVC's  Ectopy Frequency Occasional  Vascular  R Radial Pulse +2  L Radial Pulse +2  Edema Generalized;Right upper extremity;Left upper extremity;Right lower extremity;Left lower extremity;Facial  Integumentary  Integumentary (WDL) X  Skin Color Appropriate for ethnicity  Musculoskeletal  Musculoskeletal (WDL) WDL  GU Assessment  Genitourinary (WDL) X  Genitourinary Symptoms  (HD)  Psychosocial  Psychosocial (WDL) WDL  Patient Behaviors  (sleeping)

## 2018-11-05 NOTE — Progress Notes (Signed)
HD tx start    11/05/18 0000  Vital Signs  Pulse Rate 83  Pulse Rate Source Monitor  Resp (!) 22  BP (!) 164/77  BP Location Right Arm  BP Method Automatic  Patient Position (if appropriate) Lying  Oxygen Therapy  SpO2 95 %  O2 Device Room Air  During Hemodialysis Assessment  Blood Flow Rate (mL/min) 400 mL/min  Arterial Pressure (mmHg) -140 mmHg  Venous Pressure (mmHg) 160 mmHg  Transmembrane Pressure (mmHg) 70 mmHg  Ultrafiltration Rate (mL/min) 1330 mL/min  Dialysate Flow Rate (mL/min) 800 ml/min  Conductivity: Machine  13.7  HD Safety Checks Performed Yes  Dialysis Fluid Bolus Normal Saline  Bolus Amount (mL) 250 mL  Intra-Hemodialysis Comments Tx initiated

## 2018-11-05 NOTE — Progress Notes (Signed)
Post HD assessment. Pt tolerated tx well without c/o or complication. Pt taken off 30 minutes before HD tx end per pt request. Pt stated that he needed to go to work and wanted to be taken off, MD aware. Net UF 3511, goal met.    11/05/18 0310  Vital Signs  Temp 97.7 F (36.5 C)  Temp Source Oral  Pulse Rate 81  Pulse Rate Source Monitor  Resp 16  BP (!) 141/68  BP Location Right Arm  BP Method Automatic  Patient Position (if appropriate) Lying  Oxygen Therapy  SpO2 91 %  O2 Device Room Air  Dialysis Weight  Weight 69.6 kg  Type of Weight Post-Dialysis  Post-Hemodialysis Assessment  Rinseback Volume (mL) 250 mL  KECN 68.3 V  Dialyzer Clearance Lightly streaked  Duration of HD Treatment -hour(s) 3 hour(s)  Hemodialysis Intake (mL) 500 mL  UF Total -Machine (mL) 4011 mL  Net UF (mL) 3511 mL  Tolerated HD Treatment Yes  AVG/AVF Arterial Site Held (minutes) 10 minutes  AVG/AVF Venous Site Held (minutes) 10 minutes  Education / Care Plan  Dialysis Education Provided Yes  Documented Education in Care Plan Yes

## 2018-11-05 NOTE — Progress Notes (Signed)
HD tx end, pt taken off 30 minutes before HD tx end time per pt request, MD aware.    11/05/18 0306  Vital Signs  Pulse Rate 79  Pulse Rate Source Monitor  Resp 18  BP (!) 125/59  BP Location Right Arm  BP Method Automatic  Patient Position (if appropriate) Lying  Oxygen Therapy  SpO2 92 %  O2 Device Room Air  During Hemodialysis Assessment  Dialysis Fluid Bolus Normal Saline  Bolus Amount (mL) 250 mL  Intra-Hemodialysis Comments Tx completed

## 2018-11-05 NOTE — Progress Notes (Signed)
Pre HD assessment    11/04/18 2353  Neurological  Level of Consciousness Alert  Orientation Level Oriented X4  Respiratory  Respiratory Pattern Labored;Dyspnea at rest;Dyspnea with exertion  Chest Assessment Chest expansion symmetrical  Cardiac  Pulse Regular  ECG Monitor Yes  Cardiac Rhythm NSR  Vascular  R Radial Pulse +2  L Radial Pulse +2  Edema Generalized;Right upper extremity;Left upper extremity;Facial;Right lower extremity;Left lower extremity  Integumentary  Integumentary (WDL) X  Skin Color Appropriate for ethnicity  Musculoskeletal  Musculoskeletal (WDL) WDL  GU Assessment  Genitourinary (WDL) X  Genitourinary Symptoms  (HD)  Psychosocial  Psychosocial (WDL) X  Patient Behaviors Cooperative;Anxious;Restless

## 2018-11-05 NOTE — ED Notes (Signed)
Report received from [place], RN from dialysis. Pt transported back to ED by this RN via wheelchair. Pt states that he is leaving AMA because he has to be at work at 0600 am. Pt reports he does not have time to wait for any d/c paperwork. MD Posey Pronto is aware of pt leaving AMA. Pt verbalized understanding of risks of leaving. Encouraged to return for any new or worsening symptoms. Pt IV removed, last set of vital signs obtained and are stable. Assisted to exit via wheelchair.

## 2018-11-08 LAB — GLUCOSE, CAPILLARY: Glucose-Capillary: 44 mg/dL — CL (ref 70–99)

## 2018-11-19 ENCOUNTER — Emergency Department: Payer: Medicare Other

## 2018-11-19 ENCOUNTER — Inpatient Hospital Stay
Admission: EM | Admit: 2018-11-19 | Discharge: 2018-11-20 | DRG: 640 | Disposition: A | Discharge status: Home / Self Care | Payer: Medicare Other | Attending: Internal Medicine | Admitting: Internal Medicine

## 2018-11-19 ENCOUNTER — Other Ambulatory Visit: Payer: Self-pay

## 2018-11-19 DIAGNOSIS — I132 Hypertensive heart and chronic kidney disease with heart failure and with stage 5 chronic kidney disease, or end stage renal disease: Secondary | ICD-10-CM | POA: Diagnosis present

## 2018-11-19 DIAGNOSIS — F17210 Nicotine dependence, cigarettes, uncomplicated: Secondary | ICD-10-CM | POA: Diagnosis present

## 2018-11-19 DIAGNOSIS — D631 Anemia in chronic kidney disease: Secondary | ICD-10-CM | POA: Diagnosis not present

## 2018-11-19 DIAGNOSIS — Z992 Dependence on renal dialysis: Secondary | ICD-10-CM | POA: Diagnosis not present

## 2018-11-19 DIAGNOSIS — R05 Cough: Secondary | ICD-10-CM

## 2018-11-19 DIAGNOSIS — N186 End stage renal disease: Secondary | ICD-10-CM | POA: Diagnosis present

## 2018-11-19 DIAGNOSIS — I509 Heart failure, unspecified: Secondary | ICD-10-CM | POA: Diagnosis present

## 2018-11-19 DIAGNOSIS — E875 Hyperkalemia: Principal | ICD-10-CM | POA: Diagnosis present

## 2018-11-19 DIAGNOSIS — J40 Bronchitis, not specified as acute or chronic: Secondary | ICD-10-CM | POA: Diagnosis present

## 2018-11-19 DIAGNOSIS — Z8249 Family history of ischemic heart disease and other diseases of the circulatory system: Secondary | ICD-10-CM | POA: Diagnosis not present

## 2018-11-19 DIAGNOSIS — Z79899 Other long term (current) drug therapy: Secondary | ICD-10-CM | POA: Diagnosis not present

## 2018-11-19 DIAGNOSIS — R059 Cough, unspecified: Secondary | ICD-10-CM

## 2018-11-19 LAB — COMPREHENSIVE METABOLIC PANEL
ALT: 17 U/L (ref 0–44)
AST: 30 U/L (ref 15–41)
Albumin: 3.8 g/dL (ref 3.5–5.0)
Alkaline Phosphatase: 121 U/L (ref 38–126)
Anion gap: 20 — ABNORMAL HIGH (ref 5–15)
BUN: 73 mg/dL — ABNORMAL HIGH (ref 6–20)
CO2: 18 mmol/L — ABNORMAL LOW (ref 22–32)
Calcium: 7.8 mg/dL — ABNORMAL LOW (ref 8.9–10.3)
Chloride: 99 mmol/L (ref 98–111)
Creatinine, Ser: 12.82 mg/dL — ABNORMAL HIGH (ref 0.61–1.24)
GFR calc Af Amer: 4 mL/min — ABNORMAL LOW (ref >60)
GFR calc non Af Amer: 4 mL/min — ABNORMAL LOW (ref >60)
Glucose, Bld: 112 mg/dL — ABNORMAL HIGH (ref 70–99)
Potassium: 6.1 mmol/L — ABNORMAL HIGH (ref 3.5–5.1)
Sodium: 137 mmol/L (ref 135–145)
Total Bilirubin: 1.9 mg/dL — ABNORMAL HIGH (ref 0.3–1.2)
Total Protein: 7.3 g/dL (ref 6.5–8.1)

## 2018-11-19 LAB — BRAIN NATRIURETIC PEPTIDE: B Natriuretic Peptide: 1352 pg/mL — ABNORMAL HIGH (ref 0.0–100.0)

## 2018-11-19 LAB — CBC
HCT: 35.5 % — ABNORMAL LOW (ref 39.0–52.0)
Hemoglobin: 11 g/dL — ABNORMAL LOW (ref 13.0–17.0)
MCH: 30.5 pg (ref 26.0–34.0)
MCHC: 31 g/dL (ref 30.0–36.0)
MCV: 98.3 fL (ref 80.0–100.0)
PLATELETS: 229 10*3/uL (ref 150–400)
RBC: 3.61 MIL/uL — ABNORMAL LOW (ref 4.22–5.81)
RDW: 17.2 % — ABNORMAL HIGH (ref 11.5–15.5)
WBC: 4.1 10*3/uL (ref 4.0–10.5)
nRBC: 0 % (ref 0.0–0.2)

## 2018-11-19 LAB — TROPONIN I: Troponin I: 0.07 ng/mL (ref <0.03)

## 2018-11-19 MED ORDER — AZITHROMYCIN 250 MG PO TABS: ORAL_TABLET | ORAL | 0 refills | Status: DC

## 2018-11-19 MED ORDER — GUAIFENESIN 100 MG/5ML PO SOLN
5.0000 mL | Freq: Once | ORAL | Status: AC
  Administered 2018-11-19: 100 mg via ORAL
  Filled 2018-11-19: qty 5

## 2018-11-19 MED ORDER — BENZONATATE 100 MG PO CAPS
100.0000 mg | ORAL_CAPSULE | Freq: Once | ORAL | Status: AC
  Administered 2018-11-19: 100 mg via ORAL
  Filled 2018-11-19: qty 1

## 2018-11-19 MED ORDER — IPRATROPIUM-ALBUTEROL 0.5-2.5 (3) MG/3ML IN SOLN
3.0000 mL | Freq: Once | RESPIRATORY_TRACT | Status: AC
  Administered 2018-11-19: 3 mL via RESPIRATORY_TRACT
  Filled 2018-11-19: qty 3

## 2018-11-19 MED ORDER — ALBUTEROL SULFATE HFA 108 (90 BASE) MCG/ACT IN AERS: 2.0000 | INHALATION_SPRAY | Freq: Four times a day (QID) | RESPIRATORY_TRACT | 2 refills | Status: AC | PRN

## 2018-11-19 NOTE — ED Notes (Addendum)
Pt reports his cough is not better reports at home he tried everything OTC and did not helped with cough, RN spoke with Dr. Cinda Quest received verbal order to administered tessalon pearl for cough 100mg 

## 2018-11-19 NOTE — ED Triage Notes (Signed)
Patient c/o cough and SOB X 2 weeks. Patient missed dialysis today. Patient is dyspneic and with labored respirations in triage.

## 2018-11-19 NOTE — ED Notes (Signed)
Dr. Malinda at bedside.  

## 2018-11-19 NOTE — ED Notes (Signed)
Pt finished breathing treatment, pt talks in complete sentences no distress noted

## 2018-11-19 NOTE — Discharge Instructions (Signed)
I will give you some Zithromax antibiotic to take for the cough and the phlegm you are coughing up.  I will also give you an inhaler 2 puffs 4 times a day to see if that helps the cough as well.  Please return here if you get worse.  Please make sure you do not miss your next dialysis.  If you have fluid overload before you get your next dialysis please come in and we will get it done here.  Make sure you return if you get a high fever, shortness of breath or feel sicker.

## 2018-11-19 NOTE — ED Notes (Signed)
Pt ambulated per Dr. Cinda Quest orders pt's oxygen saturation prior ambulation 98% RA, Pt ambulated about 20 feet no shorethness of breath and oxygen saturation 100% RA

## 2018-11-19 NOTE — ED Provider Notes (Addendum)
[facility] Emergency Department Provider Note   ____________________________________________   First MD Initiated Contact with Patient 11/19/18 2027     (approximate)  I have reviewed the triage vital signs and the nursing notes.   HISTORY  Chief Complaint Cough    HPI Jeremy [name] is a 58 y.o. male patient complains of a cough that is producing some phlegm.  Small amounts of some yellowish phlegm.  He is not running a fever although he did feel chilled.  He reports his chest burns when he coughs.  Patient has had several episodes of ER visits for missing dialysis and fluid overload.  He missed dialysis again today because he felt too sick to go.         Past Medical History:  Diagnosis Date  . Bronchitis   . Hypertension   . Renal disorder    kidney failure, dialysis    Patient Active Problem List   Diagnosis Date Noted  . Pulmonary edema 09/22/2018  . Hyperkalemia 05/16/2018    Past Surgical History:  Procedure Laterality Date  . DIALYSIS FISTULA CREATION      Prior to Admission medications   Medication Sig Start Date End Date Taking? Authorizing Provider  albuterol (PROVENTIL HFA;VENTOLIN HFA) 108 (90 Base) MCG/ACT inhaler Inhale 2 puffs into the lungs every 6 (six) hours as needed for wheezing or shortness of breath. 11/19/18   Nena Polio, MD  amLODipine (NORVASC) 10 MG tablet Take 10 mg by mouth daily. 03/29/18   [provider]  azithromycin (ZITHROMAX Z-PAK) 250 MG tablet Take 2 tablets (500 mg) on  Day 1,  followed by 1 tablet (250 mg) once daily on Days 2 through 5. 11/19/18 11/24/18  Nena Polio, MD  chlorpheniramine-HYDROcodone (TUSSIONEX PENNKINETIC ER) 10-8 MG/5ML SUER Take 5 mLs by mouth every 12 (twelve) hours as needed for cough. Patient not taking: Reported on 09/22/2018 09/18/18   Duanne Guess, PA-C  gabapentin (NEURONTIN) 300 MG capsule Take 1 capsule (300 mg total) by mouth daily as needed. 05/19/18 11/04/18   Henreitta Leber, MD  metoprolol tartrate (LOPRESSOR) 25 MG tablet Take 25 mg by mouth daily. 03/29/18   [provider]  Multiple Vitamins-Iron (MULTI-DAY PLUS IRON) TABS Take 1 tablet by mouth daily.    [provider]  predniSONE (DELTASONE) 10 MG tablet Take 1 tablet (10 mg total) by mouth daily. Label  & dispense according to the schedule below.  6 tablets day one, then 5 table day 2, then 4 tablets day 3, then 3 tablets day 4, 2 tablets day 5, then 1 tablet day 6, then stop Patient not taking: Reported on 10/24/2018 09/23/18   Saundra Shelling, MD  sevelamer carbonate (RENVELA) 800 MG tablet Take 1,600 mg by mouth 3 (three) times daily with meals. 03/02/18   [provider]  VELPHORO 500 MG chewable tablet Chew 500 mg by mouth 6 (six) times daily. 05/27/18   [provider]    Allergies Patient has no known allergies.  Family History  Problem Relation Age of Onset  . Hypertension Mother     Social History Social History   Tobacco Use  . Smoking status: Current Every Day Smoker    Packs/day: 1.00    Types: Cigarettes  . Smokeless tobacco: Never Used  Substance Use Topics  . Alcohol use: Never    Frequency: Never  . Drug use: Yes    Types: Marijuana    Review of Systems  Constitutional:  No fever/chills Eyes: No visual changes. ENT: No sore throat. Cardiovascular: Denies chest pain. Respiratory: Denies shortness of breath. Gastrointestinal: No abdominal pain.  No nausea, no vomiting.  No diarrhea.  No constipation. Genitourinary: Negative for dysuria. Musculoskeletal: Negative for back pain. Skin: Negative for rash. Neurological: Negative for headaches, focal weakness  ____________________________________________   PHYSICAL EXAM:  VITAL SIGNS: ED Triage Vitals  Enc Vitals Group     BP 11/19/18 2011 (!) 157/79     Pulse Rate 11/19/18 2011 79     Resp 11/19/18 2011 (!) 28     Temp 11/19/18 2011 99.9 F (37.7 C)     Temp Source  11/19/18 2103 Oral     SpO2 11/19/18 2011 96 %     Weight 11/19/18 2011 154 lb 5.2 oz (70 kg)     Height --      Head Circumference --      Peak Flow --      Pain Score 11/19/18 2011 10     Pain Loc --      Pain Edu? --      Excl. in [place]? --     Constitutional: Alert and oriented. Well appearing and in no acute distress. Eyes: Conjunctivae are normal.  Head: Atraumatic. Nose: No congestion/rhinnorhea. Mouth/Throat: Mucous membranes are moist.  Oropharynx non-erythematous. Neck: No stridor.   Cardiovascular: Normal rate, regular rhythm. Grossly normal heart sounds.  Good peripheral circulation. Respiratory: Normal respiratory effort.  No retractions. Lungs CTAB. Gastrointestinal: Soft and nontender. No distention. No abdominal bruits. No CVA tenderness. Musculoskeletal: No lower extremity tenderness nor edema.   Neurologic:  Normal speech and language. No gross focal neurologic deficits are appreciated. No gait instability. Skin:  Skin is warm, dry and intact. No rash noted.   ____________________________________________   LABS (all labs ordered are listed, but only abnormal results are displayed)  Labs Reviewed  CBC - Abnormal; Notable for the following components:      Result Value   RBC 3.61 (*)    Hemoglobin 11.0 (*)    HCT 35.5 (*)    RDW 17.2 (*)    All other components within normal limits  TROPONIN I - Abnormal; Notable for the following components:   Troponin I 0.07 (*)    All other components within normal limits  COMPREHENSIVE METABOLIC PANEL - Abnormal; Notable for the following components:   Potassium 6.1 (*)    CO2 18 (*)    Glucose, Bld 112 (*)    BUN 73 (*)    Creatinine, Ser 12.82 (*)    Calcium 7.8 (*)    Total Bilirubin 1.9 (*)    GFR calc non Af Amer 4 (*)    GFR calc Af Amer 4 (*)    Anion gap 20 (*)    All other components within normal limits  BRAIN NATRIURETIC PEPTIDE - Abnormal; Notable for the following components:   B Natriuretic Peptide  1,352.0 (*)    All other components within normal limits   ____________________________________________  EKG EKG read interpreted by me shows normal sinus rhythm rate of 81 normal axis no acute changes  ____________________________________________  RADIOLOGY  ED MD interpretation: X-ray read by radiology reviewed by me shows stable cardiomegaly without any obvious pulmonary edema  Official radiology report(s): Dg Chest 2 View  Result Date: 11/19/2018 CLINICAL DATA:  Dyspnea EXAM: CHEST - 2 VIEW COMPARISON:  11/04/2018 chest radiograph. FINDINGS: Vascular stent overlies the left axilla. Stable cardiomediastinal silhouette with moderate cardiomegaly. No pneumothorax.  No pleural effusion. No overt pulmonary edema. No acute consolidative airspace disease. IMPRESSION: Stable moderate cardiomegaly without overt pulmonary edema. No active pulmonary disease. Electronically Signed   By: Ilona Sorrel M.D.   On: 11/19/2018 20:38    ____________________________________________   PROCEDURES  Procedure(s) performed (including Critical Care):  Procedures   ____________________________________________   INITIAL IMPRESSION / ASSESSMENT AND PLAN / ED COURSE  Patient complains of cough without any other real complaints.  O2 sats did not drop when he walks.  He feels better after DuoNeb.  Troponin is chronically elevated.  His BNP is lower than usual.  Chest x-ray looks clear.  However his potassium 6.1 therefore I will have to get him in the hospital.              ____________________________________________   FINAL CLINICAL IMPRESSION(S) / ED DIAGNOSES  Final diagnoses:  Cough  Hyperkalemia     ED Discharge Orders         Ordered    albuterol (PROVENTIL HFA;VENTOLIN HFA) 108 (90 Base) MCG/ACT inhaler  Every 6 hours PRN     11/19/18 2224    azithromycin (ZITHROMAX Z-PAK) 250 MG tablet     11/19/18 2224           Note:  This document was prepared using Dragon voice  recognition software and may include unintentional dictation errors.    Nena Polio, MD 11/19/18 2225    Nena Polio, MD 11/19/18 2227

## 2018-11-19 NOTE — ED Notes (Signed)
Elevated troponin 0.07 lab called Dr. Cinda Quest made aware

## 2018-11-19 NOTE — ED Notes (Signed)
Pt reports every time he coughs he feels his chest is burning, pt refuses to stay on cardiac monitor reports cables stick his skin . RN explained potassium is elevated and needs to be monitor pt refuses. MD aware

## 2018-11-19 NOTE — ED Notes (Signed)
Attempted IV insertion x2 without success. Pt placed on cardiac monitor by dawn, rn. Dawn, rn in to attempt iv insertion. Blood work was obtained and sent to lab.

## 2018-11-20 DIAGNOSIS — E875 Hyperkalemia: Secondary | ICD-10-CM | POA: Diagnosis not present

## 2018-11-20 DIAGNOSIS — R05 Cough: Secondary | ICD-10-CM | POA: Diagnosis not present

## 2018-11-20 LAB — HEMOGLOBIN A1C
Hgb A1c MFr Bld: 5.7 % — ABNORMAL HIGH (ref 4.8–5.6)
Mean Plasma Glucose: 116.89 mg/dL

## 2018-11-20 LAB — TSH: TSH: 1.954 u[IU]/mL (ref 0.350–4.500)

## 2018-11-20 LAB — MRSA PCR SCREENING: MRSA by PCR: NEGATIVE

## 2018-11-20 MED ORDER — ADULT MULTIVITAMIN W/MINERALS CH
1.0000 | ORAL_TABLET | Freq: Every day | ORAL | Status: DC
  Administered 2018-11-20: 1 via ORAL
  Filled 2018-11-20: qty 1

## 2018-11-20 MED ORDER — ACETAMINOPHEN 325 MG PO TABS: 650.0000 mg | ORAL_TABLET | Freq: Four times a day (QID) | ORAL | Status: DC | PRN

## 2018-11-20 MED ORDER — AMLODIPINE BESYLATE 10 MG PO TABS
10.0000 mg | ORAL_TABLET | Freq: Every day | ORAL | Status: DC
  Administered 2018-11-20: 10 mg via ORAL
  Filled 2018-11-20: qty 1

## 2018-11-20 MED ORDER — DOCUSATE SODIUM 100 MG PO CAPS: 100.0000 mg | ORAL_CAPSULE | Freq: Two times a day (BID) | ORAL | Status: DC

## 2018-11-20 MED ORDER — ACETAMINOPHEN 650 MG RE SUPP: 650.0000 mg | Freq: Four times a day (QID) | RECTAL | Status: DC | PRN

## 2018-11-20 MED ORDER — DM-GUAIFENESIN ER 30-600 MG PO TB12: 1.0000 | ORAL_TABLET | Freq: Two times a day (BID) | ORAL | Status: DC

## 2018-11-20 MED ORDER — GUAIFENESIN-DM 100-10 MG/5ML PO SYRP
5.0000 mL | ORAL_SOLUTION | ORAL | Status: DC | PRN
  Administered 2018-11-20 (×2): 5 mL via ORAL
  Filled 2018-11-20 (×2): qty 5

## 2018-11-20 MED ORDER — CHLORHEXIDINE GLUCONATE CLOTH 2 % EX PADS: 6.0000 | MEDICATED_PAD | Freq: Every day | CUTANEOUS | Status: DC

## 2018-11-20 MED ORDER — HYDROCOD POLST-CPM POLST ER 10-8 MG/5ML PO SUER
5.0000 mL | Freq: Two times a day (BID) | ORAL | Status: DC | PRN
  Administered 2018-11-20: 5 mL via ORAL
  Filled 2018-11-20: qty 5

## 2018-11-20 MED ORDER — SEVELAMER CARBONATE 800 MG PO TABS
1600.0000 mg | ORAL_TABLET | Freq: Three times a day (TID) | ORAL | Status: DC
  Administered 2018-11-20 (×2): 1600 mg via ORAL
  Filled 2018-11-20 (×2): qty 2

## 2018-11-20 MED ORDER — GUAIFENESIN-DM 100-10 MG/5ML PO SYRP: 5.0000 mL | ORAL_SOLUTION | ORAL | 0 refills | Status: DC | PRN

## 2018-11-20 MED ORDER — ONDANSETRON HCL 4 MG PO TABS: 4.0000 mg | ORAL_TABLET | Freq: Four times a day (QID) | ORAL | Status: DC | PRN

## 2018-11-20 MED ORDER — SUCROFERRIC OXYHYDROXIDE 500 MG PO CHEW
500.0000 mg | CHEWABLE_TABLET | Freq: Every day | ORAL | Status: DC
  Administered 2018-11-20 (×2): 500 mg via ORAL
  Filled 2018-11-20 (×6): qty 1

## 2018-11-20 MED ORDER — METOPROLOL TARTRATE 25 MG PO TABS
25.0000 mg | ORAL_TABLET | Freq: Every day | ORAL | Status: DC
  Administered 2018-11-20: 25 mg via ORAL
  Filled 2018-11-20: qty 1

## 2018-11-20 MED ORDER — GUAIFENESIN ER 600 MG PO TB12
600.0000 mg | ORAL_TABLET | Freq: Two times a day (BID) | ORAL | Status: DC
  Administered 2018-11-20: 600 mg via ORAL
  Filled 2018-11-20: qty 1

## 2018-11-20 MED ORDER — HEPARIN SODIUM (PORCINE) 5000 UNIT/ML IJ SOLN
5000.0000 [IU] | Freq: Three times a day (TID) | INTRAMUSCULAR | Status: DC
  Filled 2018-11-20: qty 1

## 2018-11-20 MED ORDER — ONDANSETRON HCL 4 MG/2ML IJ SOLN: 4.0000 mg | Freq: Four times a day (QID) | INTRAMUSCULAR | Status: DC | PRN

## 2018-11-20 MED ORDER — DEXTROMETHORPHAN POLISTIREX ER 30 MG/5ML PO SUER
30.0000 mg | Freq: Two times a day (BID) | ORAL | Status: DC
  Administered 2018-11-20: 30 mg via ORAL
  Filled 2018-11-20 (×2): qty 5

## 2018-11-20 NOTE — Progress Notes (Signed)
HD tx end    11/20/18 1323  Vital Signs  Pulse Rate 81  Pulse Rate Source Monitor  Resp (!) 21  BP (!) 156/71  BP Location Right Arm  BP Method Automatic  Patient Position (if appropriate) Lying  Oxygen Therapy  SpO2 96 %  O2 Device Room Air  During Hemodialysis Assessment  Dialysis Fluid Bolus Normal Saline  Bolus Amount (mL) 250 mL  Intra-Hemodialysis Comments Tx completed

## 2018-11-20 NOTE — ED Notes (Signed)
ED TO INPATIENT HANDOFF REPORT  ED Nurse Name and Phone #: Torrie Mayers, RN [phone]  S Name/Age/Gender Jeremy [name] 58 y.o. male Room/Bed: ED26A/ED26A  Code Status   Code Status: Prior  Home/SNF/Other Home Patient oriented to: self, place, time and situation Is this baseline? Yes   Triage Complete: Triage complete  Chief Complaint Cough  Triage Note Patient c/o cough and SOB X 2 weeks. Patient missed dialysis today. Patient is dyspneic and with labored respirations in triage.    Allergies No Known Allergies  Level of Care/Admitting Diagnosis ED Disposition    ED Disposition Condition [place] [facility]: [place] [100120]  Level of Care: Med-Surg [16]  Diagnosis: Hyperkalemia [[id number]]  Admitting Physician: Harrie Foreman [[id number]]  Attending Physician: Harrie Foreman [[id number]]  Estimated length of stay: past midnight tomorrow  Certification:: I certify this patient will need inpatient services for at least 2 midnights  PT Class (Do Not Modify): Inpatient [101]  PT Acc Code (Do Not Modify): Private [1]       B Medical/Surgery History Past Medical History:  Diagnosis Date  . Bronchitis   . Hypertension   . Renal disorder    kidney failure, dialysis   Past Surgical History:  Procedure Laterality Date  . DIALYSIS FISTULA CREATION       A IV Location/Drains/Wounds Patient Lines/Drains/Airways Status   Active Line/Drains/Airways    Name:   Placement date:   Placement time:   Site:   Days:   Peripheral IV 11/19/18 Right;Anterior Forearm   11/19/18    2202    Forearm   1   Fistula / Graft Left Upper arm   05/17/18    -    Upper arm   187          Intake/Output Last 24 hours No intake or output data in the 24 hours ending 11/20/18 0037  Labs/Imaging Results for orders placed or performed during the hospital encounter of 11/19/18 (from the past 48 hour(s))  CBC     Status: Abnormal   Collection Time:  11/19/18  8:38 PM  Result Value Ref Range   WBC 4.1 4.0 - 10.5 K/uL   RBC 3.61 (L) 4.22 - 5.81 MIL/uL   Hemoglobin 11.0 (L) 13.0 - 17.0 g/dL   HCT 35.5 (L) 39.0 - 52.0 %   MCV 98.3 80.0 - 100.0 fL   MCH 30.5 26.0 - 34.0 pg   MCHC 31.0 30.0 - 36.0 g/dL   RDW 17.2 (H) 11.5 - 15.5 %   Platelets 229 150 - 400 K/uL   nRBC 0.0 0.0 - 0.2 %    Comment: Performed at [facility], [address] [place], [place] [address]  Troponin I - ONCE - STAT     Status: Abnormal   Collection Time: 11/19/18  8:38 PM  Result Value Ref Range   Troponin I 0.07 (HH) <0.03 ng/mL    Comment: CRITICAL RESULT CALLED TO, READ BACK BY AND VERIFIED WITH SYLVIA Chyan Carnero RN AT 2115 11/19/2018. MSS Performed at [facility], [address] [place], [place] [address]   Comprehensive metabolic panel     Status: Abnormal   Collection Time: 11/19/18  8:38 PM  Result Value Ref Range   Sodium 137 135 - 145 mmol/L   Potassium 6.1 (H) 3.5 - 5.1 mmol/L   Chloride 99 98 - 111 mmol/L   CO2 18 (L) 22 - 32 mmol/L   Glucose, Bld 112 (  H) 70 - 99 mg/dL   BUN 73 (H) 6 - 20 mg/dL   Creatinine, Ser 12.82 (H) 0.61 - 1.24 mg/dL   Calcium 7.8 (L) 8.9 - 10.3 mg/dL   Total Protein 7.3 6.5 - 8.1 g/dL   Albumin 3.8 3.5 - 5.0 g/dL   AST 30 15 - 41 U/L   ALT 17 0 - 44 U/L   Alkaline Phosphatase 121 38 - 126 U/L   Total Bilirubin 1.9 (H) 0.3 - 1.2 mg/dL   GFR calc non Af Amer 4 (L) >60 mL/min   GFR calc Af Amer 4 (L) >60 mL/min   Anion gap 20 (H) 5 - 15    Comment: Performed at [facility], [address] [place], [place] [address]  Brain natriuretic peptide     Status: Abnormal   Collection Time: 11/19/18  8:38 PM  Result Value Ref Range   B Natriuretic Peptide 1,352.0 (H) 0.0 - 100.0 pg/mL    Comment: Performed at [facility], [address] [place], [place] [address]   Dg Chest 2 View  Result Date: 11/19/2018 CLINICAL DATA:  Dyspnea EXAM: CHEST - 2 VIEW COMPARISON:  11/04/2018 chest  radiograph. FINDINGS: Vascular stent overlies the left axilla. Stable cardiomediastinal silhouette with moderate cardiomegaly. No pneumothorax. No pleural effusion. No overt pulmonary edema. No acute consolidative airspace disease. IMPRESSION: Stable moderate cardiomegaly without overt pulmonary edema. No active pulmonary disease. Electronically Signed   By: Ilona Sorrel M.D.   On: 11/19/2018 20:38    Pending Labs Unresulted Labs (From admission, onward)    Start     Ordered   Signed and Held  TSH  Add-on,   R     Signed and Held   Signed and Held  Hemoglobin A1c  Add-on,   R     Signed and Held          Vitals/Pain Today's Vitals   11/19/18 2011 11/19/18 2103 11/19/18 2130 11/19/18 2323  BP: (!) 157/79 (!) 149/80 134/72 (!) 148/79  Pulse: 79 81 81 83  Resp: (!) 28 (!) 21  (!) 24  Temp: 99.9 F (37.7 C) 98.4 F (36.9 C)  98 F (36.7 C)  TempSrc:  Oral  Oral  SpO2: 96% 97% 95% 94%  Weight: 70 kg     PainSc: 10-Worst pain ever 10-Worst pain ever  10-Worst pain ever    Isolation Precautions No active isolations  Medications Medications  ipratropium-albuterol (DUONEB) 0.5-2.5 (3) MG/3ML nebulizer solution 3 mL (3 mLs Nebulization Given 11/19/18 2211)  guaiFENesin (ROBITUSSIN) 100 MG/5ML solution 100 mg (100 mg Oral Given 11/19/18 2329)  benzonatate (TESSALON) capsule 100 mg (100 mg Oral Given 11/19/18 2359)    Mobility Independent  Low fall risk   Focused Assessments NA   R Recommendations: See Admitting Provider Note  Report given to:   Additional Notes: Pt is supposed to have dialysis M/W/F, reports he missed dialysis Friday, reports he works every day and developed cough, congestion since Thursday,  ED TO INPATIENT HANDOFF REPORT  ED Nurse Name and Phone #: Torrie Mayers, RN [phone]  S Name/Age/Gender Jeremy [name] 58 y.o. male Room/Bed: ED26A/ED26A  Code Status   Code Status: Prior  Home/SNF/Other Home Patient oriented to: self, place, time and  situation Is this baseline? Yes   Triage Complete: Triage complete  Chief Complaint Cough  Triage Note Patient c/o cough and SOB X 2 weeks. Patient missed dialysis today. Patient is dyspneic and with labored respirations in triage.  Allergies No Known Allergies  Level of Care/Admitting Diagnosis ED Disposition    ED Disposition Condition [place] [facility] Area: [place] [100120]  Level of Care: Med-Surg [16]  Diagnosis: Hyperkalemia [[id number]]  Admitting Physician: Harrie Foreman [[id number]]  Attending Physician: Harrie Foreman [[id number]]  Estimated length of stay: past midnight tomorrow  Certification:: I certify this patient will need inpatient services for at least 2 midnights  PT Class (Do Not Modify): Inpatient [101]  PT Acc Code (Do Not Modify): Private [1]       B Medical/Surgery History Past Medical History:  Diagnosis Date  . Bronchitis   . Hypertension   . Renal disorder    kidney failure, dialysis   Past Surgical History:  Procedure Laterality Date  . DIALYSIS FISTULA CREATION       A IV Location/Drains/Wounds Patient Lines/Drains/Airways Status   Active Line/Drains/Airways    Name:   Placement date:   Placement time:   Site:   Days:   Peripheral IV 11/19/18 Right;Anterior Forearm   11/19/18    2202    Forearm   1   Fistula / Graft Left Upper arm   05/17/18    -    Upper arm   187          Intake/Output Last 24 hours No intake or output data in the 24 hours ending 11/20/18 0037  Labs/Imaging Results for orders placed or performed during the hospital encounter of 11/19/18 (from the past 48 hour(s))  CBC     Status: Abnormal   Collection Time: 11/19/18  8:38 PM  Result Value Ref Range   WBC 4.1 4.0 - 10.5 K/uL   RBC 3.61 (L) 4.22 - 5.81 MIL/uL   Hemoglobin 11.0 (L) 13.0 - 17.0 g/dL   HCT 35.5 (L) 39.0 - 52.0 %   MCV 98.3 80.0 - 100.0 fL   MCH 30.5 26.0 - 34.0 pg   MCHC 31.0 30.0 - 36.0 g/dL   RDW 17.2  (H) 11.5 - 15.5 %   Platelets 229 150 - 400 K/uL   nRBC 0.0 0.0 - 0.2 %    Comment: Performed at [facility], [address] [place], [place] [address]  Troponin I - ONCE - STAT     Status: Abnormal   Collection Time: 11/19/18  8:38 PM  Result Value Ref Range   Troponin I 0.07 (HH) <0.03 ng/mL    Comment: CRITICAL RESULT CALLED TO, READ BACK BY AND VERIFIED WITH SYLVIA Amela Handley RN AT 2115 11/19/2018. MSS Performed at [facility], [address] [place], [place] [address]   Comprehensive metabolic panel     Status: Abnormal   Collection Time: 11/19/18  8:38 PM  Result Value Ref Range   Sodium 137 135 - 145 mmol/L   Potassium 6.1 (H) 3.5 - 5.1 mmol/L   Chloride 99 98 - 111 mmol/L   CO2 18 (L) 22 - 32 mmol/L   Glucose, Bld 112 (H) 70 - 99 mg/dL   BUN 73 (H) 6 - 20 mg/dL   Creatinine, Ser 12.82 (H) 0.61 - 1.24 mg/dL   Calcium 7.8 (L) 8.9 - 10.3 mg/dL   Total Protein 7.3 6.5 - 8.1 g/dL   Albumin 3.8 3.5 - 5.0 g/dL   AST 30 15 - 41 U/L   ALT 17 0 - 44 U/L   Alkaline Phosphatase 121 38 - 126 U/L   Total Bilirubin 1.9 (H) 0.3 - 1.2 mg/dL  GFR calc non Af Amer 4 (L) >60 mL/min   GFR calc Af Amer 4 (L) >60 mL/min   Anion gap 20 (H) 5 - 15    Comment: Performed at [facility], [address] [place], [place] [address]  Brain natriuretic peptide     Status: Abnormal   Collection Time: 11/19/18  8:38 PM  Result Value Ref Range   B Natriuretic Peptide 1,352.0 (H) 0.0 - 100.0 pg/mL    Comment: Performed at [facility], [address], [place], [place] [address]   Dg Chest 2 View  Result Date: 11/19/2018 CLINICAL DATA:  Dyspnea EXAM: CHEST - 2 VIEW COMPARISON:  11/04/2018 chest radiograph. FINDINGS: Vascular stent overlies the left axilla. Stable cardiomediastinal silhouette with moderate cardiomegaly. No pneumothorax. No pleural effusion. No overt pulmonary edema. No acute consolidative airspace disease. IMPRESSION: Stable moderate cardiomegaly  without overt pulmonary edema. No active pulmonary disease. Electronically Signed   By: Ilona Sorrel M.D.   On: 11/19/2018 20:38    Pending Labs Unresulted Labs (From admission, onward)    Start     Ordered   Signed and Held  TSH  Add-on,   R     Signed and Held   Signed and Held  Hemoglobin A1c  Add-on,   R     Signed and Held          Vitals/Pain Today's Vitals   11/19/18 2011 11/19/18 2103 11/19/18 2130 11/19/18 2323  BP: (!) 157/79 (!) 149/80 134/72 (!) 148/79  Pulse: 79 81 81 83  Resp: (!) 28 (!) 21  (!) 24  Temp: 99.9 F (37.7 C) 98.4 F (36.9 C)  98 F (36.7 C)  TempSrc:  Oral  Oral  SpO2: 96% 97% 95% 94%  Weight: 70 kg     PainSc: 10-Worst pain ever 10-Worst pain ever  10-Worst pain ever    Isolation Precautions No active isolations  Medications Medications  ipratropium-albuterol (DUONEB) 0.5-2.5 (3) MG/3ML nebulizer solution 3 mL (3 mLs Nebulization Given 11/19/18 2211)  guaiFENesin (ROBITUSSIN) 100 MG/5ML solution 100 mg (100 mg Oral Given 11/19/18 2329)  benzonatate (TESSALON) capsule 100 mg (100 mg Oral Given 11/19/18 2359)    Mobility Independent  Low fall risk   Focused Assessments NA   R Recommendations: See Admitting Provider Note  Report given to:   Additional Notes: N/A

## 2018-11-20 NOTE — Progress Notes (Signed)
Pre HD Assessment    11/20/18 0939  Vital Signs  Temp 99 F (37.2 C)  Temp Source Oral  Pulse Rate 79  Pulse Rate Source Monitor  Resp (!) 22  BP 134/69  BP Location Right Arm  BP Method Automatic  Patient Position (if appropriate) Lying  Oxygen Therapy  SpO2 90 %  O2 Device Room Air  Pain Assessment  Pain Scale 0-10  Pain Score Asleep  Dialysis Weight  Weight 65.6 kg  Type of Weight Pre-Dialysis  Time-Out for Hemodialysis  What Procedure? HD  Pt Identifiers(min of two) First/Last Name;MRN/Account#  Correct Site? Yes  Correct Side? Yes  Correct Procedure? Yes  Consents Verified? Yes  Rad Studies Available? N/A  Safety Precautions Reviewed? Yes  Engineer, civil (consulting) Number  (4A)  Station Number 4  UF/Alarm Test Passed  Conductivity: Meter 13.8  Conductivity: Machine  13.9  pH 7.2  Reverse Osmosis main  Normal Saline Lot Number [id number]  Dialyzer Lot Number [id number]  Disposable Set Lot Number [id number]  Machine Temperature 98.6 F (37 C)  Musician and Audible Yes  Blood Lines Intact and Secured Yes  Pre Treatment Patient Checks  Vascular access used during treatment Fistula  Hepatitis B Surface Antigen Results Negative  Date Hepatitis B Surface Antigen Drawn 05/18/18  Hepatitis B Surface Antibody  (>10)  Date Hepatitis B Surface Antibody Drawn 05/18/18  Hemodialysis Consent Verified Yes  Hemodialysis Standing Orders Initiated Yes  ECG (Telemetry) Monitor On Yes  Prime Ordered Normal Saline  Length of  DialysisTreatment -hour(s) 3.5 Hour(s)  Dialyzer Elisio 17H NR  Dialysate 2K, 2.5 Ca  Dialysis Anticoagulant None  Dialysate Flow Ordered 800  Blood Flow Rate Ordered 400 mL/min  Ultrafiltration Goal 2 Liters  Pre Treatment Labs  (A1C)  Dialysis Blood Pressure Support Ordered Normal Saline  Education / Care Plan  Dialysis Education Provided Yes  Documented Education in Care Plan Yes  Fistula / Graft Left Upper arm  Placement Date: 05/17/18    Placed prior to admission: Yes  Orientation: Left  Access Location: Upper arm  Site Condition No complications  Fistula / Graft Assessment Present;Thrill;Bruit  Drainage Description None

## 2018-11-20 NOTE — Progress Notes (Signed)
HD tx start    11/20/18 0948  Vital Signs  Pulse Rate 77  Pulse Rate Source Monitor  Resp (!) 22  BP (!) 119/55  BP Location Right Arm  BP Method Automatic  Patient Position (if appropriate) Lying  Oxygen Therapy  SpO2 91 %  O2 Device Room Air  During Hemodialysis Assessment  Blood Flow Rate (mL/min) 400 mL/min  Arterial Pressure (mmHg) -140 mmHg  Venous Pressure (mmHg) 170 mmHg  Transmembrane Pressure (mmHg) 60 mmHg  Ultrafiltration Rate (mL/min) 860 mL/min  Dialysate Flow Rate (mL/min) 800 ml/min  Conductivity: Machine  13.9  HD Safety Checks Performed Yes  Dialysis Fluid Bolus Normal Saline  Bolus Amount (mL) 250 mL  Intra-Hemodialysis Comments Tx initiated  Fistula / Graft Left Upper arm  Placement Date: 05/17/18   Placed prior to admission: Yes  Orientation: Left  Access Location: Upper arm  Status Accessed  Needle Size 15

## 2018-11-20 NOTE — Progress Notes (Signed)
Jeremy [name]  A and O x 4. VSS. Pt tolerating diet well. No complaints of pain or nausea. IV removed intact, prescriptions given. Pt voiced understanding of discharge instructions with no further questions. Pt discharged via wheelchair with NT.    Allergies as of 11/20/2018   No Known Allergies     Medication List    TAKE these medications   albuterol 108 (90 Base) MCG/ACT inhaler Commonly known as:  PROVENTIL HFA;VENTOLIN HFA Inhale 2 puffs into the lungs every 6 (six) hours as needed for wheezing or shortness of breath.   amLODipine 10 MG tablet Commonly known as:  NORVASC Take 10 mg by mouth daily.   azithromycin 250 MG tablet Commonly known as:  Zithromax Z-Pak Take 2 tablets (500 mg) on  Day 1,  followed by 1 tablet (250 mg) once daily on Days 2 through 5.   gabapentin 300 MG capsule Commonly known as:  NEURONTIN Take 1 capsule (300 mg total) by mouth daily as needed.   guaiFENesin-dextromethorphan 100-10 MG/5ML syrup Commonly known as:  ROBITUSSIN DM Take 5 mLs by mouth every 4 (four) hours as needed for cough.   metoprolol tartrate 25 MG tablet Commonly known as:  LOPRESSOR Take 25 mg by mouth daily.   Multi-Day Plus Iron Tabs Take 1 tablet by mouth daily.   sevelamer carbonate 800 MG tablet Commonly known as:  RENVELA Take 1,600 mg by mouth 3 (three) times daily with meals.   Velphoro 500 MG chewable tablet Generic drug:  sucroferric oxyhydroxide Chew 500 mg by mouth 6 (six) times daily.       Vitals:   11/20/18 1339 11/20/18 1410  BP:  (!) 143/62  Pulse: 84 82  Resp: (!) 26 16  Temp:  98.7 F (37.1 C)  SpO2: 95% 92%    Francesco Sor

## 2018-11-20 NOTE — Progress Notes (Signed)
Pre HD assessment    11/20/18 0936  Neurological  Level of Consciousness Responds to Voice  Orientation Level Oriented X4  Respiratory  Respiratory Pattern Labored  Chest Assessment Chest expansion symmetrical  Cardiac  Pulse Regular  ECG Monitor Yes  Cardiac Rhythm NSR  Vascular  R Radial Pulse +2  L Radial Pulse +2  Edema Generalized  Integumentary  Integumentary (WDL) X  Skin Color Appropriate for ethnicity  Musculoskeletal  Musculoskeletal (WDL) X  Generalized Weakness Yes  Assistive Device None  GU Assessment  Genitourinary (WDL) X  Genitourinary Symptoms  (HD)  Psychosocial  Psychosocial (WDL) X  Patient Behaviors Agitated;Irritable  Needs Expressed Physical  Emotional support given Given to patient

## 2018-11-20 NOTE — Progress Notes (Signed)
Spoke With Dr. Marcille Blanco about patient refusing the telemetry to placed on him. Patient educated on the importance of the telemetry monitoring and he still refuses to wear it.  Dr. Marcille Blanco gave order to discontinue telemetry. Jeremy [name]

## 2018-11-20 NOTE — H&P (Signed)
Jeremy [name] is an 58 y.o. male.   Chief Complaint: Cough HPI: The patient with past medical history of ESRD and hypertension presents to the emergency department complaining of cough.  The patient admits that he missed dialysis 2 days in a row.  Oxygen saturations were normal upon arrival and the patient's chest x-ray did not overtly show pulmonary edema.  However his potassium was greater than 6; the patient also reported chest pain with his cough which obligated the emergency department staff to call the hospitalist service for further evaluation.  Past Medical History:  Diagnosis Date  . Bronchitis   . Hypertension   . Renal disorder    kidney failure, dialysis    Past Surgical History:  Procedure Laterality Date  . DIALYSIS FISTULA CREATION      Family History  Problem Relation Age of Onset  . Hypertension Mother    Social History:  reports that he has been smoking cigarettes. He has been smoking about 1.00 pack per day. He has never used smokeless tobacco. He reports current drug use. Drug: Marijuana. He reports that he does not drink alcohol.  Allergies: No Known Allergies  Medications Prior to Admission  Medication Sig Dispense Refill  . amLODipine (NORVASC) 10 MG tablet Take 10 mg by mouth daily.    Marland Kitchen gabapentin (NEURONTIN) 300 MG capsule Take 1 capsule (300 mg total) by mouth daily as needed. 30 capsule 1  . metoprolol tartrate (LOPRESSOR) 25 MG tablet Take 25 mg by mouth daily.    . Multiple Vitamins-Iron (MULTI-DAY PLUS IRON) TABS Take 1 tablet by mouth daily.    . sevelamer carbonate (RENVELA) 800 MG tablet Take 1,600 mg by mouth 3 (three) times daily with meals.    . VELPHORO 500 MG chewable tablet Chew 500 mg by mouth 6 (six) times daily.      Results for orders placed or performed during the hospital encounter of 11/19/18 (from the past 48 hour(s))  CBC     Status: Abnormal   Collection Time: 11/19/18  8:38 PM  Result Value Ref Range   WBC 4.1 4.0 - 10.5 K/uL   RBC 3.61 (L) 4.22 - 5.81 MIL/uL   Hemoglobin 11.0 (L) 13.0 - 17.0 g/dL   HCT 35.5 (L) 39.0 - 52.0 %   MCV 98.3 80.0 - 100.0 fL   MCH 30.5 26.0 - 34.0 pg   MCHC 31.0 30.0 - 36.0 g/dL   RDW 17.2 (H) 11.5 - 15.5 %   Platelets 229 150 - 400 K/uL   nRBC 0.0 0.0 - 0.2 %    Comment: Performed at [facility], [address] [place], [place] [address]  Troponin I - ONCE - STAT     Status: Abnormal   Collection Time: 11/19/18  8:38 PM  Result Value Ref Range   Troponin I 0.07 (HH) <0.03 ng/mL    Comment: CRITICAL RESULT CALLED TO, READ BACK BY AND VERIFIED WITH SYLVIA URGILES RN AT 2115 11/19/2018. MSS Performed at [facility], [address] [place], [place] [address]   Comprehensive metabolic panel     Status: Abnormal   Collection Time: 11/19/18  8:38 PM  Result Value Ref Range   Sodium 137 135 - 145 mmol/L   Potassium 6.1 (H) 3.5 - 5.1 mmol/L   Chloride 99 98 - 111 mmol/L   CO2 18 (L) 22 - 32 mmol/L   Glucose, Bld 112 (H) 70 - 99 mg/dL   BUN 73 (H) 6 - 20 mg/dL  Creatinine, Ser 12.82 (H) 0.61 - 1.24 mg/dL   Calcium 7.8 (L) 8.9 - 10.3 mg/dL   Total Protein 7.3 6.5 - 8.1 g/dL   Albumin 3.8 3.5 - 5.0 g/dL   AST 30 15 - 41 U/L   ALT 17 0 - 44 U/L   Alkaline Phosphatase 121 38 - 126 U/L   Total Bilirubin 1.9 (H) 0.3 - 1.2 mg/dL   GFR calc non Af Amer 4 (L) >60 mL/min   GFR calc Af Amer 4 (L) >60 mL/min   Anion gap 20 (H) 5 - 15    Comment: Performed at [facility], [address], [place], [place] [address]  Brain natriuretic peptide     Status: Abnormal   Collection Time: 11/19/18  8:38 PM  Result Value Ref Range   B Natriuretic Peptide 1,352.0 (H) 0.0 - 100.0 pg/mL    Comment: Performed at [facility], [address], [place], [place] [address]   Dg Chest 2 View  Result Date: 11/19/2018 CLINICAL DATA:  Dyspnea EXAM: CHEST - 2 VIEW COMPARISON:  11/04/2018 chest radiograph. FINDINGS: Vascular stent overlies the left axilla. Stable  cardiomediastinal silhouette with moderate cardiomegaly. No pneumothorax. No pleural effusion. No overt pulmonary edema. No acute consolidative airspace disease. IMPRESSION: Stable moderate cardiomegaly without overt pulmonary edema. No active pulmonary disease. Electronically Signed   By: Ilona Sorrel M.D.   On: 11/19/2018 20:38    Review of Systems  Constitutional: Negative for chills and fever.  HENT: Negative for sore throat and tinnitus.   Eyes: Negative for blurred vision and redness.  Respiratory: Positive for cough. Negative for shortness of breath.   Cardiovascular: Positive for chest pain (With cough). Negative for palpitations, orthopnea and PND.  Gastrointestinal: Negative for abdominal pain, diarrhea, nausea and vomiting.  Genitourinary: Negative for dysuria, frequency and urgency.  Musculoskeletal: Negative for joint pain and myalgias.  Skin: Negative for rash.       No lesions  Neurological: Negative for speech change, focal weakness and weakness.  Endo/Heme/Allergies: Does not bruise/bleed easily.       No temperature intolerance  Psychiatric/Behavioral: Negative for depression and suicidal ideas.    Blood pressure (!) 148/79, pulse 83, temperature 98 F (36.7 C), temperature source Oral, resp. rate (!) 24, weight 70 kg, SpO2 94 %. Physical Exam  Vitals reviewed. Constitutional: He is oriented to person, place, and time. He appears well-developed and well-nourished. No distress.  HENT:  Head: Normocephalic and atraumatic.  Mouth/Throat: Oropharynx is clear and moist.  Eyes: Pupils are equal, round, and reactive to light. Conjunctivae and EOM are normal. No scleral icterus.  Neck: Normal range of motion. Neck supple. No JVD present. No tracheal deviation present. No thyromegaly present.  Cardiovascular: Normal rate and regular rhythm. Exam reveals no gallop and no friction rub.  No murmur heard. Respiratory: Effort normal and breath sounds normal. No respiratory  distress.  GI: Soft. Bowel sounds are normal. He exhibits no distension. There is no abdominal tenderness.  Genitourinary:    Genitourinary Comments: Deferred   Musculoskeletal: Normal range of motion.        General: No edema.  Lymphadenopathy:    He has no cervical adenopathy.  Neurological: He is alert and oriented to person, place, and time. No cranial nerve deficit.  Skin: Skin is warm and dry. No rash noted. No erythema.  Psychiatric: He has a normal mood and affect. His behavior is normal. Judgment and thought content normal.     Assessment/Plan  This is a 58 year old male admitted for hyperkalemia. 1.  Hyperkalemia: No EKG changes; give Kayexalate or agent preferred by nephrology for excretion  2.  ESRD: Consult nephrology for continuation of dialysis 3.  Hypertension: Trolled; continue amlodipine and metoprolol 4.  Elevated troponin: Secondary to poor renal clearance.  Nonetheless, follow cardiac biomarkers.  Monitor telemetry. 5.  DVT prophylaxis: Heparin 6.  GI prophylaxis: None The patient is a full code.  Time spent on admission orders and patient care approximately 45 minutes   Harrie Foreman, MD 11/20/2018, 1:38 AM

## 2018-11-20 NOTE — Progress Notes (Signed)
Post HD assessment    11/20/18 1337  Neurological  Level of Consciousness Responds to Voice  Orientation Level Oriented X4  Respiratory  Respiratory Pattern Labored;Tachypnea  Chest Assessment Chest expansion symmetrical  Cough Productive;Strong  Sputum Amount Small  Cardiac  Pulse Regular  ECG Monitor Yes  Cardiac Rhythm NSR  Vascular  R Radial Pulse +2  L Radial Pulse +2  Edema Generalized  Integumentary  Integumentary (WDL) X  Skin Color Appropriate for ethnicity  Musculoskeletal  Musculoskeletal (WDL) X  Generalized Weakness Yes  Assistive Device None  GU Assessment  Genitourinary (WDL) X  Genitourinary Symptoms  (HD)  Psychosocial  Psychosocial (WDL) X  Patient Behaviors Agitated;Irritable  Needs Expressed Physical  Emotional support given Given to patient

## 2018-11-20 NOTE — Discharge Summary (Signed)
[place] [facility] [place] NAME: Jeremy [name]    MR#:  [id number]  DATE OF BIRTH:  08/07/1961  DATE OF ADMISSION:  11/19/2018 ADMITTING PHYSICIAN: Harrie Foreman, MD  DATE OF DISCHARGE: 11/20/2018   PRIMARY CARE PHYSICIAN: Patient, No Pcp Per    ADMISSION DIAGNOSIS:  Cough [R05] Hyperkalemia [E87.5]  DISCHARGE DIAGNOSIS:  Active Problems:   Hyperkalemia   SECONDARY DIAGNOSIS:   Past Medical History:  Diagnosis Date  . Bronchitis   . Hypertension   . Renal disorder    kidney failure, dialysis    HOSPITAL COURSE:   This is a 58 year old male admitted for hyperkalemia. 1.  Hyperkalemia: No EKG changes; given Kayexalate or agent preferred by nephrology for excretion. Urgent hemodialysis done by nephrologist today. Nephrologist wanted to do 1 more dialysis tomorrow but patient does not want to stay.  He is feeling little bit better so we are discharging home with advised to follow-up with his regular dialysis Monday. 2.  ESRD: Consult nephrology for continuation of dialysis 3.  Hypertension: Trolled; continue amlodipine and metoprolol 4.  Elevated troponin: Secondary to poor renal clearance.  Nonetheless, follow cardiac biomarkers.  Monitor telemetry. 5.  DVT prophylaxis: Heparin 6.  GI prophylaxis: None 7.  Cough-this could be contributed by fluid overload due to missed hemodialysis and may also have some bronchitis.  Given azithromycin.  DISCHARGE CONDITIONS:   Stable.  CONSULTS OBTAINED:  Treatment Team:  Murlean Iba, MD  DRUG ALLERGIES:  No Known Allergies  DISCHARGE MEDICATIONS:   Allergies as of 11/20/2018   No Known Allergies     Medication List    TAKE these medications   albuterol 108 (90 Base) MCG/ACT inhaler Commonly known as:  PROVENTIL HFA;VENTOLIN HFA Inhale 2 puffs into the lungs every 6 (six) hours as needed for wheezing or shortness of breath.   amLODipine 10 MG tablet Commonly known as:   NORVASC Take 10 mg by mouth daily.   azithromycin 250 MG tablet Commonly known as:  Zithromax Z-Pak Take 2 tablets (500 mg) on  Day 1,  followed by 1 tablet (250 mg) once daily on Days 2 through 5.   gabapentin 300 MG capsule Commonly known as:  NEURONTIN Take 1 capsule (300 mg total) by mouth daily as needed.   guaiFENesin-dextromethorphan 100-10 MG/5ML syrup Commonly known as:  ROBITUSSIN DM Take 5 mLs by mouth every 4 (four) hours as needed for cough.   metoprolol tartrate 25 MG tablet Commonly known as:  LOPRESSOR Take 25 mg by mouth daily.   Multi-Day Plus Iron Tabs Take 1 tablet by mouth daily.   sevelamer carbonate 800 MG tablet Commonly known as:  RENVELA Take 1,600 mg by mouth 3 (three) times daily with meals.   Velphoro 500 MG chewable tablet Generic drug:  sucroferric oxyhydroxide Chew 500 mg by mouth 6 (six) times daily.        DISCHARGE INSTRUCTIONS:   Follow with PMD and Out pt HD as schedule.  If you experience worsening of your admission symptoms, develop shortness of breath, life threatening emergency, suicidal or homicidal thoughts you must seek medical attention immediately by calling 911 or calling your MD immediately  if symptoms less severe.  You Must read complete instructions/literature along with all the possible adverse reactions/side effects for all the Medicines you take and that have been prescribed to you. Take any new Medicines after you have completely understood and accept all the possible adverse reactions/side effects.  Please note  You were cared for by a hospitalist during your hospital stay. If you have any questions about your discharge medications or the care you received while you were in the hospital after you are discharged, you can call the unit and asked to speak with the hospitalist on call if the hospitalist that took care of you is not available. Once you are discharged, your primary care physician will handle any further  medical issues. Please note that NO REFILLS for any discharge medications will be authorized once you are discharged, as it is imperative that you return to your primary care physician (or establish a relationship with a primary care physician if you do not have one) for your aftercare needs so that they can reassess your need for medications and monitor your lab values.    Today   CHIEF COMPLAINT:   Chief Complaint  Patient presents with  . Cough    HISTORY OF PRESENT ILLNESS:  Jeremy [name]  is a 58 y.o. male with a known history of ESRD and hypertension presents to the emergency department complaining of cough.  The patient admits that he missed dialysis 2 days in a row.  Oxygen saturations were normal upon arrival and the patient's chest x-ray did not overtly show pulmonary edema.  However his potassium was greater than 6; the patient also reported chest pain with his cough which obligated the emergency department staff to call the hospitalist service for further evaluation.   VITAL SIGNS:  Blood pressure (!) 143/62, pulse 82, temperature 98.7 F (37.1 C), resp. rate 16, height 5\' 6"  (1.676 m), weight 63.5 kg, SpO2 92 %.  I/O:    Intake/Output Summary (Last 24 hours) at 11/20/2018 1422 Last data filed at 11/20/2018 1330 Gross per 24 hour  Intake 240 ml  Output 3033 ml  Net -2793 ml    PHYSICAL EXAMINATION:  GENERAL:  58 y.o.-year-old patient lying in the bed with no acute distress.  EYES: Pupils equal, round, reactive to light and accommodation. No scleral icterus. Extraocular muscles intact.  HEENT: Head atraumatic, normocephalic. Oropharynx and nasopharynx clear.  NECK:  Supple, no jugular venous distention. No thyroid enlargement, no tenderness.  LUNGS: Normal breath sounds bilaterally, no wheezing, rales,rhonchi or crepitation. No use of accessory muscles of respiration.  CARDIOVASCULAR: S1, S2 normal. No murmurs, rubs, or gallops.  ABDOMEN: Soft, non-tender, non-distended.  Bowel sounds present. No organomegaly or mass.  EXTREMITIES: No pedal edema, cyanosis, or clubbing.  NEUROLOGIC: Cranial nerves II through XII are intact. Muscle strength 5/5 in all extremities. Sensation intact. Gait not checked.  PSYCHIATRIC: The patient is alert and oriented x 3.  SKIN: No obvious rash, lesion, or ulcer.   DATA REVIEW:   CBC Recent Labs  Lab 11/19/18 2038  WBC 4.1  HGB 11.0*  HCT 35.5*  PLT 229    Chemistries  Recent Labs  Lab 11/19/18 2038  NA 137  K 6.1*  CL 99  CO2 18*  GLUCOSE 112*  BUN 73*  CREATININE 12.82*  CALCIUM 7.8*  AST 30  ALT 17  ALKPHOS 121  BILITOT 1.9*    Cardiac Enzymes Recent Labs  Lab 11/19/18 2038  TROPONINI 0.07*    Microbiology Results  Results for orders placed or performed during the hospital encounter of 11/19/18  MRSA PCR Screening     Status: None   Collection Time: 11/20/18  1:25 AM  Result Value Ref Range Status   MRSA by PCR NEGATIVE NEGATIVE Final  Comment:        The GeneXpert MRSA Assay (FDA approved for NASAL specimens only), is one component of a comprehensive MRSA colonization surveillance program. It is not intended to diagnose MRSA infection nor to guide or monitor treatment for MRSA infections. Performed at [facility], [address] [place], [place] [address]     RADIOLOGY:  Dg Chest 2 View  Result Date: 11/19/2018 CLINICAL DATA:  Dyspnea EXAM: CHEST - 2 VIEW COMPARISON:  11/04/2018 chest radiograph. FINDINGS: Vascular stent overlies the left axilla. Stable cardiomediastinal silhouette with moderate cardiomegaly. No pneumothorax. No pleural effusion. No overt pulmonary edema. No acute consolidative airspace disease. IMPRESSION: Stable moderate cardiomegaly without overt pulmonary edema. No active pulmonary disease. Electronically Signed   By: Ilona Sorrel M.D.   On: 11/19/2018 20:38    EKG:   Orders placed or performed during the hospital encounter of 11/19/18  . EKG  12-Lead  . EKG 12-Lead  . ED EKG  . ED EKG      Management plans discussed with the patient, family and they are in agreement.  CODE STATUS:     Code Status Orders  (From admission, onward)         Start     Ordered   11/20/18 0122  Full code  Continuous     11/20/18 0121        Code Status History    Date Active Date Inactive Code Status Order ID Comments User Context   09/22/2018 1841 09/23/2018 2017 Full Code [id number]  Saundra Shelling, MD Inpatient   05/16/2018 1709 05/19/2018 1433 Full Code [id number]  Vaughan Basta, MD Inpatient      TOTAL TIME TAKING CARE OF THIS PATIENT: 35 minutes.    Vaughan Basta M.D on 11/20/2018 at 2:22 PM  Between 7am to 6pm - Pager - [phone]  After 6pm go to [email or web address] - password [email or web address]  Office  [phone]  CC: Primary care physician; Patient, No Pcp Per   Note: This dictation was prepared with Dragon dictation along with smaller phrase technology. Any transcriptional errors that result from this process are unintentional.

## 2018-11-20 NOTE — Progress Notes (Signed)
[facility] [place], [place] 11/20/18  Subjective:   Patient is known to our practice from outpatient dialysis as well as previous admissions.  He frequently misses outpatient treatment stating that is because of his work schedule.  This time he presents for cough.  Work-up in the ER shows severe hyperkalemia with potassium greater than 6.  Last outpatient hemodialysis was on March 11.  Nephrology consult now requested to evaluate patient for hemodialysis.  Patient seen during dialysis Tolerating well    HEMODIALYSIS FLOWSHEET:  Blood Flow Rate (mL/min): 400 mL/min Arterial Pressure (mmHg): -180 mmHg Venous Pressure (mmHg): 210 mmHg Transmembrane Pressure (mmHg): 60 mmHg Ultrafiltration Rate (mL/min): 1040 mL/min Dialysate Flow Rate (mL/min): 800 ml/min Conductivity: Machine : 15.3 Conductivity: Machine : 15.3 Dialysis Fluid Bolus: Normal Saline Bolus Amount (mL): 250 mL    Objective:  Vital signs in last 24 hours:  Temp:  [98 F (36.7 C)-100.1 F (37.8 C)] 99 F (37.2 C) (03/14 0939) Pulse Rate:  [75-85] 81 (03/14 1230) Resp:  [17-28] 24 (03/14 1230) BP: (108-157)/(54-81) 140/68 (03/14 1230) SpO2:  [90 %-98 %] 93 % (03/14 1230) Weight:  [65.2 kg-70 kg] 65.6 kg (03/14 0939)  Weight change:  Filed Weights   11/19/18 2011 11/20/18 0141 11/20/18 0939  Weight: 70 kg 65.2 kg 65.6 kg    Intake/Output:    Intake/Output Summary (Last 24 hours) at 11/20/2018 1237 Last data filed at 11/20/2018 0510 Gross per 24 hour  Intake 240 ml  Output 0 ml  Net 240 ml    General appearance : well built well-nourished no acute distress HEENT: moist oral mucus membranes Neck: Supple  Chest:   normal respiratory effort Cardiovascular: Regular rate rhythm, no rubs or gallops  Abdomen: Soft   Extremities:  no peripheral edema Neuro: resting quietly  Musculoskeletal: No acute joint swelling or effusion or redness      Basic Metabolic Panel:  Recent Labs  Lab  11/19/18 2038  NA 137  K 6.1*  CL 99  CO2 18*  GLUCOSE 112*  BUN 73*  CREATININE 12.82*  CALCIUM 7.8*     CBC: Recent Labs  Lab 11/19/18 2038  WBC 4.1  HGB 11.0*  HCT 35.5*  MCV 98.3  PLT 229      Lab Results  Component Value Date   HEPBSAG Negative 05/18/2018   HEPBSAB Reactive 05/18/2018      Microbiology:  Recent Results (from the past 240 hour(s))  MRSA PCR Screening     Status: None   Collection Time: 11/20/18  1:25 AM  Result Value Ref Range Status   MRSA by PCR NEGATIVE NEGATIVE Final    Comment:        The GeneXpert MRSA Assay (FDA approved for NASAL specimens only), is one component of a comprehensive MRSA colonization surveillance program. It is not intended to diagnose MRSA infection nor to guide or monitor treatment for MRSA infections. Performed at [facility], [address] [place], [place] [address]     Coagulation Studies: No results for input(s): LABPROT, INR in the last 72 hours.  Urinalysis: No results for input(s): COLORURINE, LABSPEC, PHURINE, GLUCOSEU, HGBUR, BILIRUBINUR, KETONESUR, PROTEINUR, UROBILINOGEN, NITRITE, LEUKOCYTESUR in the last 72 hours.  Invalid input(s): APPERANCEUR    Imaging: Dg Chest 2 View  Result Date: 11/19/2018 CLINICAL DATA:  Dyspnea EXAM: CHEST - 2 VIEW COMPARISON:  11/04/2018 chest radiograph. FINDINGS: Vascular stent overlies the left axilla. Stable cardiomediastinal silhouette with moderate cardiomegaly. No pneumothorax. No pleural effusion. No  overt pulmonary edema. No acute consolidative airspace disease. IMPRESSION: Stable moderate cardiomegaly without overt pulmonary edema. No active pulmonary disease. Electronically Signed   By: Ilona Sorrel M.D.   On: 11/19/2018 20:38     Medications:    . amLODipine  10 mg Oral Daily  . Chlorhexidine Gluconate Cloth  6 each Topical Q0600  . guaiFENesin  600 mg Oral BID   And  . dextromethorphan  30 mg Oral BID  . docusate sodium  100 mg  Oral BID  . heparin  5,000 Units Subcutaneous Q8H  . metoprolol tartrate  25 mg Oral Daily  . multivitamin with minerals  1 tablet Oral Daily  . sevelamer carbonate  1,600 mg Oral TID WC  . sucroferric oxyhydroxide  500 mg Oral 6 X Daily   acetaminophen **OR** acetaminophen, chlorpheniramine-HYDROcodone, guaiFENesin-dextromethorphan, ondansetron **OR** ondansetron (ZOFRAN) IV  Assessment/ Plan:  58 y.o. male with ESRD on HD since 2012, HTN, h/o DM now diet controlled, h/o CHF, Cardiac ablation, h/o AVF stent and angioplasty,   [place] [facility]/TTS/64.5 kg/195 min (3:15)  1.  Severe hyperkalemia 2.  End-stage renal disease 3.  SHPTH 4.  Anemia of chronic kidney disease  Urgent hemodialysis this morning for correcting hyperkalemia.  He will be dialyzed against a low potassium bath.  Continue home regimen of phosphorus binders.  Hemoglobin 11.0.  We will continue to monitor.    LOS: 1 Darlen Gledhill 3/14/202012:37 PM  [place], [place]  Note: This note was prepared with Dragon dictation. Any transcription errors are unintentional

## 2018-11-20 NOTE — Progress Notes (Signed)
Post HD assessment. Pt tolerated tx well without c/o or complication. Net UF 3033, goal met.    11/20/18 1330  Vital Signs  Temp 99.4 F (37.4 C)  Temp Source Oral  Pulse Rate 81  Pulse Rate Source Monitor  Resp (!) 22  BP (!) 156/73  BP Location Right Arm  BP Method Automatic  Patient Position (if appropriate) Lying  Oxygen Therapy  SpO2 95 %  O2 Device Room Air  Dialysis Weight  Weight 63.5 kg  Type of Weight Post-Dialysis  Post-Hemodialysis Assessment  Rinseback Volume (mL) 250 mL  KECN 80.6 V  Dialyzer Clearance Lightly streaked  Duration of HD Treatment -hour(s) 3.5 hour(s)  Hemodialysis Intake (mL) 500 mL  UF Total -Machine (mL) 3533 mL  Net UF (mL) 3033 mL  Tolerated HD Treatment Yes  AVG/AVF Arterial Site Held (minutes) 10 minutes  AVG/AVF Venous Site Held (minutes) 10 minutes  Education / Care Plan  Dialysis Education Provided Yes  Documented Education in Care Plan Yes  Fistula / Graft Left Upper arm  Placement Date: 05/17/18   Placed prior to admission: Yes  Orientation: Left  Access Location: Upper arm  Site Condition No complications  Fistula / Graft Assessment Present;Thrill;Bruit  Status Deaccessed

## 2018-11-21 ENCOUNTER — Other Ambulatory Visit: Payer: Self-pay

## 2018-11-21 ENCOUNTER — Inpatient Hospital Stay
Admission: EM | Admit: 2018-11-21 | Discharge: 2018-11-22 | DRG: 871 | Disposition: A | Discharge status: Home / Self Care | Payer: Medicare Other | Attending: Internal Medicine | Admitting: Internal Medicine

## 2018-11-21 ENCOUNTER — Emergency Department: Payer: Medicare Other

## 2018-11-21 DIAGNOSIS — Z716 Tobacco abuse counseling: Secondary | ICD-10-CM | POA: Diagnosis not present

## 2018-11-21 DIAGNOSIS — E162 Hypoglycemia, unspecified: Secondary | ICD-10-CM

## 2018-11-21 DIAGNOSIS — N186 End stage renal disease: Secondary | ICD-10-CM | POA: Diagnosis not present

## 2018-11-21 DIAGNOSIS — I132 Hypertensive heart and chronic kidney disease with heart failure and with stage 5 chronic kidney disease, or end stage renal disease: Secondary | ICD-10-CM | POA: Diagnosis not present

## 2018-11-21 DIAGNOSIS — I509 Heart failure, unspecified: Secondary | ICD-10-CM | POA: Diagnosis present

## 2018-11-21 DIAGNOSIS — Z992 Dependence on renal dialysis: Secondary | ICD-10-CM

## 2018-11-21 DIAGNOSIS — A4189 Other specified sepsis: Principal | ICD-10-CM | POA: Diagnosis present

## 2018-11-21 DIAGNOSIS — E1122 Type 2 diabetes mellitus with diabetic chronic kidney disease: Secondary | ICD-10-CM | POA: Diagnosis present

## 2018-11-21 DIAGNOSIS — A419 Sepsis, unspecified organism: Secondary | ICD-10-CM

## 2018-11-21 DIAGNOSIS — N2581 Secondary hyperparathyroidism of renal origin: Secondary | ICD-10-CM | POA: Diagnosis present

## 2018-11-21 DIAGNOSIS — J101 Influenza due to other identified influenza virus with other respiratory manifestations: Secondary | ICD-10-CM | POA: Diagnosis present

## 2018-11-21 DIAGNOSIS — E11649 Type 2 diabetes mellitus with hypoglycemia without coma: Secondary | ICD-10-CM | POA: Diagnosis not present

## 2018-11-21 DIAGNOSIS — D631 Anemia in chronic kidney disease: Secondary | ICD-10-CM | POA: Diagnosis not present

## 2018-11-21 DIAGNOSIS — F17210 Nicotine dependence, cigarettes, uncomplicated: Secondary | ICD-10-CM | POA: Diagnosis not present

## 2018-11-21 DIAGNOSIS — Z79899 Other long term (current) drug therapy: Secondary | ICD-10-CM

## 2018-11-21 LAB — CBC WITH DIFFERENTIAL/PLATELET
Abs Immature Granulocytes: 0.02 10*3/uL (ref 0.00–0.07)
BASOS ABS: 0 10*3/uL (ref 0.0–0.1)
Basophils Relative: 1 %
Eosinophils Absolute: 0.1 10*3/uL (ref 0.0–0.5)
Eosinophils Relative: 1 %
HCT: 41.6 % (ref 39.0–52.0)
Hemoglobin: 12.9 g/dL — ABNORMAL LOW (ref 13.0–17.0)
Immature Granulocytes: 1 %
Lymphocytes Relative: 12 %
Lymphs Abs: 0.5 10*3/uL — ABNORMAL LOW (ref 0.7–4.0)
MCH: 29.9 pg (ref 26.0–34.0)
MCHC: 31 g/dL (ref 30.0–36.0)
MCV: 96.5 fL (ref 80.0–100.0)
Monocytes Absolute: 0.5 10*3/uL (ref 0.1–1.0)
Monocytes Relative: 11 %
NEUTROS ABS: 3.2 10*3/uL (ref 1.7–7.7)
Neutrophils Relative %: 74 %
Platelets: 203 10*3/uL (ref 150–400)
RBC: 4.31 MIL/uL (ref 4.22–5.81)
RDW: 17.1 % — ABNORMAL HIGH (ref 11.5–15.5)
WBC: 4.3 10*3/uL (ref 4.0–10.5)
nRBC: 0 % (ref 0.0–0.2)

## 2018-11-21 LAB — COMPREHENSIVE METABOLIC PANEL
ALT: 16 U/L (ref 0–44)
AST: 37 U/L (ref 15–41)
Albumin: 4 g/dL (ref 3.5–5.0)
Alkaline Phosphatase: 119 U/L (ref 38–126)
Anion gap: 20 — ABNORMAL HIGH (ref 5–15)
BUN: 51 mg/dL — ABNORMAL HIGH (ref 6–20)
CHLORIDE: 97 mmol/L — AB (ref 98–111)
CO2: 20 mmol/L — ABNORMAL LOW (ref 22–32)
Calcium: 8.8 mg/dL — ABNORMAL LOW (ref 8.9–10.3)
Creatinine, Ser: 11.05 mg/dL — ABNORMAL HIGH (ref 0.61–1.24)
GFR calc Af Amer: 5 mL/min — ABNORMAL LOW (ref >60)
GFR calc non Af Amer: 5 mL/min — ABNORMAL LOW (ref >60)
Glucose, Bld: 68 mg/dL — ABNORMAL LOW (ref 70–99)
POTASSIUM: 5 mmol/L (ref 3.5–5.1)
Sodium: 137 mmol/L (ref 135–145)
Total Bilirubin: 0.9 mg/dL (ref 0.3–1.2)
Total Protein: 7.9 g/dL (ref 6.5–8.1)

## 2018-11-21 LAB — GLUCOSE, CAPILLARY
GLUCOSE-CAPILLARY: 77 mg/dL (ref 70–99)
Glucose-Capillary: 126 mg/dL — ABNORMAL HIGH (ref 70–99)
Glucose-Capillary: 54 mg/dL — ABNORMAL LOW (ref 70–99)
Glucose-Capillary: 85 mg/dL (ref 70–99)

## 2018-11-21 LAB — LACTIC ACID, PLASMA: Lactic Acid, Venous: 1.3 mmol/L (ref 0.5–1.9)

## 2018-11-21 LAB — PROTIME-INR
INR: 1.1 (ref 0.8–1.2)
Prothrombin Time: 14 seconds (ref 11.4–15.2)

## 2018-11-21 LAB — INFLUENZA PANEL BY PCR (TYPE A & B)
INFLAPCR: NEGATIVE
Influenza B By PCR: POSITIVE — AB

## 2018-11-21 MED ORDER — ACETAMINOPHEN 650 MG RE SUPP: 650.0000 mg | Freq: Four times a day (QID) | RECTAL | Status: DC | PRN

## 2018-11-21 MED ORDER — TAB-A-VITE/IRON PO TABS
1.0000 | ORAL_TABLET | Freq: Every day | ORAL | Status: DC
  Administered 2018-11-22: 1 via ORAL
  Filled 2018-11-21: qty 1

## 2018-11-21 MED ORDER — BISACODYL 5 MG PO TBEC: 5.0000 mg | DELAYED_RELEASE_TABLET | Freq: Every day | ORAL | Status: DC | PRN

## 2018-11-21 MED ORDER — SODIUM CHLORIDE 0.9 % IV SOLN: 250.0000 mL | INTRAVENOUS | Status: DC | PRN

## 2018-11-21 MED ORDER — GUAIFENESIN ER 600 MG PO TB12: 600.0000 mg | ORAL_TABLET | Freq: Two times a day (BID) | ORAL | Status: DC

## 2018-11-21 MED ORDER — DEXTROSE-NACL 5-0.45 % IV SOLN
INTRAVENOUS | Status: AC
  Administered 2018-11-21: 21:00:00 via INTRAVENOUS

## 2018-11-21 MED ORDER — SODIUM CHLORIDE 0.9 % IV SOLN
2.0000 g | Freq: Once | INTRAVENOUS | Status: AC
  Administered 2018-11-21: 2 g via INTRAVENOUS
  Filled 2018-11-21: qty 2

## 2018-11-21 MED ORDER — SEVELAMER CARBONATE 800 MG PO TABS
1600.0000 mg | ORAL_TABLET | Freq: Three times a day (TID) | ORAL | Status: DC
  Administered 2018-11-22 (×2): 1600 mg via ORAL
  Filled 2018-11-21 (×2): qty 2

## 2018-11-21 MED ORDER — FLEET ENEMA 7-19 GM/118ML RE ENEM: 1.0000 | ENEMA | Freq: Once | RECTAL | Status: DC | PRN

## 2018-11-21 MED ORDER — ONDANSETRON HCL 4 MG PO TABS: 4.0000 mg | ORAL_TABLET | Freq: Four times a day (QID) | ORAL | Status: DC | PRN

## 2018-11-21 MED ORDER — HYDROCODONE-ACETAMINOPHEN 5-325 MG PO TABS
1.0000 | ORAL_TABLET | ORAL | Status: DC | PRN
  Administered 2018-11-21 – 2018-11-22 (×2): 2 via ORAL
  Filled 2018-11-21 (×2): qty 2

## 2018-11-21 MED ORDER — DEXTROSE 50 % IV SOLN
1.0000 | Freq: Once | INTRAVENOUS | Status: AC
  Administered 2018-11-21: 50 mL via INTRAVENOUS
  Filled 2018-11-21: qty 50

## 2018-11-21 MED ORDER — SODIUM CHLORIDE 0.9% FLUSH: 3.0000 mL | INTRAVENOUS | Status: DC | PRN

## 2018-11-21 MED ORDER — SODIUM CHLORIDE 0.9% FLUSH
3.0000 mL | Freq: Two times a day (BID) | INTRAVENOUS | Status: DC
  Administered 2018-11-22: 3 mL via INTRAVENOUS

## 2018-11-21 MED ORDER — METRONIDAZOLE IN NACL 5-0.79 MG/ML-% IV SOLN
500.0000 mg | Freq: Once | INTRAVENOUS | Status: AC
  Administered 2018-11-21: 500 mg via INTRAVENOUS
  Filled 2018-11-21: qty 100

## 2018-11-21 MED ORDER — ONDANSETRON HCL 4 MG/2ML IJ SOLN: 4.0000 mg | Freq: Four times a day (QID) | INTRAMUSCULAR | Status: DC | PRN

## 2018-11-21 MED ORDER — ACETAMINOPHEN 325 MG PO TABS: 650.0000 mg | ORAL_TABLET | Freq: Once | ORAL | Status: DC

## 2018-11-21 MED ORDER — VANCOMYCIN HCL IN DEXTROSE 1-5 GM/200ML-% IV SOLN
1000.0000 mg | Freq: Once | INTRAVENOUS | Status: AC
  Administered 2018-11-21: 1000 mg via INTRAVENOUS
  Filled 2018-11-21: qty 200

## 2018-11-21 MED ORDER — OSELTAMIVIR PHOSPHATE 30 MG PO CAPS: 30.0000 mg | ORAL_CAPSULE | ORAL | Status: DC

## 2018-11-21 MED ORDER — SUCROFERRIC OXYHYDROXIDE 500 MG PO CHEW
500.0000 mg | CHEWABLE_TABLET | Freq: Every day | ORAL | Status: DC
  Administered 2018-11-22 (×3): 500 mg via ORAL
  Filled 2018-11-21 (×7): qty 1

## 2018-11-21 MED ORDER — ACETAMINOPHEN 325 MG PO TABS
ORAL_TABLET | ORAL | Status: AC
  Administered 2018-11-21: 1000 mg via ORAL
  Filled 2018-11-21: qty 2

## 2018-11-21 MED ORDER — DEXTROSE 50 % IV SOLN
INTRAVENOUS | Status: AC
  Administered 2018-11-21: 12.5 g via INTRAVENOUS
  Filled 2018-11-21: qty 50

## 2018-11-21 MED ORDER — DOCUSATE SODIUM 100 MG PO CAPS
100.0000 mg | ORAL_CAPSULE | Freq: Two times a day (BID) | ORAL | Status: DC
  Administered 2018-11-21 – 2018-11-22 (×2): 100 mg via ORAL
  Filled 2018-11-21 (×2): qty 1

## 2018-11-21 MED ORDER — ACETAMINOPHEN 325 MG PO TABS: 650.0000 mg | ORAL_TABLET | Freq: Four times a day (QID) | ORAL | Status: DC | PRN

## 2018-11-21 MED ORDER — METOPROLOL TARTRATE 25 MG PO TABS
25.0000 mg | ORAL_TABLET | Freq: Every day | ORAL | Status: DC
  Administered 2018-11-22: 25 mg via ORAL
  Filled 2018-11-21: qty 1

## 2018-11-21 MED ORDER — ACETAMINOPHEN 500 MG PO TABS
1000.0000 mg | ORAL_TABLET | Freq: Once | ORAL | Status: AC
  Administered 2018-11-21: 1000 mg via ORAL

## 2018-11-21 MED ORDER — GUAIFENESIN ER 600 MG PO TB12
600.0000 mg | ORAL_TABLET | Freq: Two times a day (BID) | ORAL | Status: DC
  Administered 2018-11-21 – 2018-11-22 (×2): 600 mg via ORAL
  Filled 2018-11-21 (×2): qty 1

## 2018-11-21 MED ORDER — ACETAMINOPHEN 500 MG PO TABS
ORAL_TABLET | ORAL | Status: AC
  Filled 2018-11-21: qty 2

## 2018-11-21 MED ORDER — OSELTAMIVIR PHOSPHATE 30 MG PO CAPS
30.0000 mg | ORAL_CAPSULE | Freq: Once | ORAL | Status: AC
  Administered 2018-11-21: 30 mg via ORAL
  Filled 2018-11-21: qty 1

## 2018-11-21 MED ORDER — HEPARIN SODIUM (PORCINE) 5000 UNIT/ML IJ SOLN
5000.0000 [IU] | Freq: Three times a day (TID) | INTRAMUSCULAR | Status: DC
  Administered 2018-11-22: 5000 [IU] via SUBCUTANEOUS
  Filled 2018-11-21 (×2): qty 1

## 2018-11-21 MED ORDER — GABAPENTIN 100 MG PO CAPS: 100.0000 mg | ORAL_CAPSULE | Freq: Every day | ORAL | Status: DC | PRN

## 2018-11-21 MED ORDER — ALBUTEROL SULFATE (2.5 MG/3ML) 0.083% IN NEBU: 3.0000 mL | INHALATION_SOLUTION | Freq: Four times a day (QID) | RESPIRATORY_TRACT | Status: DC | PRN

## 2018-11-21 MED ORDER — DEXTROSE 50 % IV SOLN
12.5000 g | Freq: Once | INTRAVENOUS | Status: AC
  Administered 2018-11-21: 12.5 g via INTRAVENOUS

## 2018-11-21 MED ORDER — POLYETHYLENE GLYCOL 3350 17 G PO PACK: 17.0000 g | PACK | Freq: Every day | ORAL | Status: DC | PRN

## 2018-11-21 MED ORDER — HYDRALAZINE HCL 20 MG/ML IJ SOLN: 10.0000 mg | INTRAMUSCULAR | Status: DC | PRN

## 2018-11-21 MED ORDER — IPRATROPIUM-ALBUTEROL 0.5-2.5 (3) MG/3ML IN SOLN: 3.0000 mL | RESPIRATORY_TRACT | Status: DC | PRN

## 2018-11-21 MED ORDER — GUAIFENESIN-DM 100-10 MG/5ML PO SYRP: 5.0000 mL | ORAL_SOLUTION | ORAL | Status: DC | PRN

## 2018-11-21 MED ORDER — AMLODIPINE BESYLATE 10 MG PO TABS
10.0000 mg | ORAL_TABLET | Freq: Every day | ORAL | Status: DC
  Administered 2018-11-21 – 2018-11-22 (×2): 10 mg via ORAL
  Filled 2018-11-21 (×2): qty 1

## 2018-11-21 NOTE — ED Notes (Signed)
Pt left triage to go have a BM. Denies kayexalate.

## 2018-11-21 NOTE — ED Provider Notes (Signed)
[facility] Emergency Department Provider Note   ____________________________________________    I have reviewed the triage vital signs and the nursing notes.   HISTORY  Chief Complaint Altered Mental Status and Fever  History limited by altered mental status   HPI Jeremy [name] is a 58 y.o. male who presents with altered mental status, fever, feeling achy and weak, cough.  Patient is unable to provide significant history as he seems mildly confused.  No family is with him at this time.  Review of records demonstrates the patient was discharged in the hospital yesterday after treatment for hyperkalemia, was noted to have a mild cough but reassuring x-rays.  Past Medical History:  Diagnosis Date  . Bronchitis   . Hypertension   . Renal disorder    kidney failure, dialysis    Patient Active Problem List   Diagnosis Date Noted  . Pulmonary edema 09/22/2018  . Hyperkalemia 05/16/2018    Past Surgical History:  Procedure Laterality Date  . DIALYSIS FISTULA CREATION      Prior to Admission medications   Medication Sig Start Date End Date Taking? Authorizing Provider  albuterol (PROVENTIL HFA;VENTOLIN HFA) 108 (90 Base) MCG/ACT inhaler Inhale 2 puffs into the lungs every 6 (six) hours as needed for wheezing or shortness of breath. 11/19/18   Nena Polio, MD  amLODipine (NORVASC) 10 MG tablet Take 10 mg by mouth daily. 03/29/18   [provider]  azithromycin (ZITHROMAX Z-PAK) 250 MG tablet Take 2 tablets (500 mg) on  Day 1,  followed by 1 tablet (250 mg) once daily on Days 2 through 5. 11/19/18 11/24/18  Nena Polio, MD  gabapentin (NEURONTIN) 300 MG capsule Take 1 capsule (300 mg total) by mouth daily as needed. 05/19/18 11/19/18  Henreitta Leber, MD  guaiFENesin-dextromethorphan (ROBITUSSIN DM) 100-10 MG/5ML syrup Take 5 mLs by mouth every 4 (four) hours as needed for cough. 11/20/18   Vaughan Basta, MD  metoprolol tartrate  (LOPRESSOR) 25 MG tablet Take 25 mg by mouth daily. 03/29/18   [provider]  Multiple Vitamins-Iron (MULTI-DAY PLUS IRON) TABS Take 1 tablet by mouth daily.    [provider]  sevelamer carbonate (RENVELA) 800 MG tablet Take 1,600 mg by mouth 3 (three) times daily with meals. 03/02/18   [provider]  VELPHORO 500 MG chewable tablet Chew 500 mg by mouth 6 (six) times daily. 05/27/18   [provider]     Allergies Patient has no known allergies.  Family History  Problem Relation Age of Onset  . Hypertension Mother     Social History Social History   Tobacco Use  . Smoking status: Current Every Day Smoker    Packs/day: 1.00    Types: Cigarettes  . Smokeless tobacco: Never Used  Substance Use Topics  . Alcohol use: Never    Frequency: Never  . Drug use: Yes    Types: Marijuana    Review of Systems  Constitutional: Positive chills Eyes: No visual changes.  ENT: No neck pain Cardiovascular: Denies chest pain. Respiratory: Denies shortness of breath.  Positive cough Gastrointestinal:  no vomiting.   Genitourinary: Does not make urine Musculoskeletal: Achy Skin: Negative for rash. Neurological: Moving all extremities   ____________________________________________   PHYSICAL EXAM:  VITAL SIGNS: ED Triage Vitals  Enc Vitals Group     BP 11/21/18 1431 (!) 134/58     Pulse Rate 11/21/18 1431 90     Resp 11/21/18 1431 (!) 24  Temp 11/21/18 1431 (!) 102.7 F (39.3 C)     Temp Source 11/21/18 1431 Oral     SpO2 11/21/18 1431 95 %     Weight 11/21/18 1432 72.6 kg (160 lb)     Height 11/21/18 1432 1.676 m (5\' 6" )     Head Circumference --      Peak Flow --      Pain Score 11/21/18 1431 0     Pain Loc --      Pain Edu? --      Excl. in [place]? --     Constitutional: Alert and able to answer most questions but somewhat confused Eyes: Conjunctivae are normal.  Head: Atraumatic. Nose: No congestion/rhinnorhea. Mouth/Throat:  Mucous membranes are moist.   Neck:  Painless ROM Cardiovascular: Tachycardia, regular rhythm. Grossly normal heart sounds.  Good peripheral circulation. Respiratory: Normal respiratory effort.  No retractions. Lungs CTAB. Gastrointestinal: Soft and nontender. No distention.    Musculoskeletal: Dialysis access left arm: Positive thrill, no redness or inflammation or tenderness Neurologic:   No gross focal neurologic deficits are appreciated.  Skin:  Skin is warm, dry and intact. No rash noted.   ____________________________________________   LABS (all labs ordered are listed, but only abnormal results are displayed)  Labs Reviewed  COMPREHENSIVE METABOLIC PANEL - Abnormal; Notable for the following components:      Result Value   Chloride 97 (*)    CO2 20 (*)    Glucose, Bld 68 (*)    BUN 51 (*)    Creatinine, Ser 11.05 (*)    Calcium 8.8 (*)    GFR calc non Af Amer 5 (*)    GFR calc Af Amer 5 (*)    Anion gap 20 (*)    All other components within normal limits  CBC WITH DIFFERENTIAL/PLATELET - Abnormal; Notable for the following components:   Hemoglobin 12.9 (*)    RDW 17.1 (*)    Lymphs Abs 0.5 (*)    All other components within normal limits  GLUCOSE, CAPILLARY - Abnormal; Notable for the following components:   Glucose-Capillary 54 (*)    All other components within normal limits  INFLUENZA PANEL BY PCR (TYPE A & B) - Abnormal; Notable for the following components:   Influenza B By PCR POSITIVE (*)    All other components within normal limits  GLUCOSE, CAPILLARY - Abnormal; Notable for the following components:   Glucose-Capillary 126 (*)    All other components within normal limits  CULTURE, BLOOD (ROUTINE X 2)  CULTURE, BLOOD (ROUTINE X 2)  LACTIC ACID, PLASMA  PROTIME-INR  GLUCOSE, CAPILLARY   ____________________________________________  EKG  ED ECG REPORT I, Lavonia Drafts, the attending physician, personally viewed and interpreted this ECG.  Date:  11/21/2018  Rhythm: normal sinus rhythm QRS Axis: normal Intervals: normal ST/T Wave abnormalities: normal Narrative Interpretation: no evidence of acute ischemia  ____________________________________________  RADIOLOGY  Chest x-ray unremarkable ____________________________________________   PROCEDURES  Procedure(s) performed: No  Procedures   Critical Care performed: yes  CRITICAL CARE Performed by: Lavonia Drafts   Total critical care time: 30 minutes  Critical care time was exclusive of separately billable procedures and treating other patients.  Critical care was necessary to treat or prevent imminent or life-threatening deterioration.  Critical care was time spent personally by me on the following activities: development of treatment plan with patient and/or surrogate as well as nursing, discussions with consultants, evaluation of patient's response to treatment, examination of patient, obtaining history  from patient or surrogate, ordering and performing treatments and interventions, ordering and review of laboratory studies, ordering and review of radiographic studies, pulse oximetry and re-evaluation of patient's condition.  ____________________________________________   INITIAL IMPRESSION / ASSESSMENT AND PLAN / ED COURSE  Pertinent labs & imaging results that were available during my care of the patient were reviewed by me and considered in my medical decision making (see chart for details).  Patient presents with confusion, fever, tachycardia, oxygen saturations 94%.  Does complain of a cough, differential includes sepsis/pneumonia/viral illness.  Noted to have hypoglycemia upon arrival, given half an amp of D50 with good response  We will check labs including influenza, some blood cultures, call code sepsis and treat with IV vancomycin and IV Zosyn given high risk factors for severe sepsis.  ----------------------------------------- 4:38 PM on 11/21/2018  ----------------------------------------- Lactic is normal, influenza B is positive, this is likely the cause of his confusion and fever.  We will cancel code sepsis at this time     ____________________________________________   FINAL CLINICAL IMPRESSION(S) / ED DIAGNOSES  Final diagnoses:  Influenza B  Hypoglycemia        Note:  This document was prepared using Dragon voice recognition software and may include unintentional dictation errors.   Lavonia Drafts, MD 11/21/18 [phone]

## 2018-11-21 NOTE — ED Notes (Signed)
GF states that pt hasn't eaten since being DC. No hx DM.   Pt agitated and occasionally yells at staff members.  Mervin Hack is pt girlfriend [phone]- wants to be informed of pt care. Had to leave to go to work.

## 2018-11-21 NOTE — Progress Notes (Signed)
Family Meeting Note  Advance Directive:yes  Today a meeting took place with the Patient.  Patient is able to participate   The following clinical team members were present during this meeting:MD  The following were discussed:Patient's diagnosis: End-stage renal disease, influenza B, Patient's progosis: Unable to determine and Goals for treatment: Full Code  Additional follow-up to be provided: prn  Time spent during discussion:20 minutes  Gorden Harms, MD

## 2018-11-21 NOTE — ED Notes (Signed)
Pt states he does not make urine.  

## 2018-11-21 NOTE — ED Notes (Signed)
Pt 87% on RA. Placed on 2L [place].

## 2018-11-21 NOTE — ED Notes (Addendum)
ED TO INPATIENT HANDOFF REPORT  ED Nurse Name and Phone #: [phone]  S Name/Age/Gender Jeremy [name] 58 y.o. male Room/Bed: ED02A/ED02A  Code Status   Code Status: Prior  Home/SNF/Other Home Patient oriented to: self, place and situation. DISORIENTED TO TIME Is this baseline? No  Triage Complete: Triage complete  Chief Complaint bad cough chills  Triage Note Girlfriend states pt was DC from hospital yesterday. States pt hasn't been talking since then because feels too bad. Pt told girlfriend he doesn't know what he was admitted for or where his prescriptions are. Pt is dialysis pt. Pt talking in triage. Fistula L arm.    Allergies No Known Allergies  Level of Care/Admitting Diagnosis ED Disposition    ED Disposition Condition Comment   Admit  The patient appears reasonably stabilized for admission considering the current resources, flow, and capabilities available in the ED at this time, and I doubt any other [facility] requiring further screening and/or treatment in the ED prior to admission is  present.       B Medical/Surgery History Past Medical History:  Diagnosis Date  . Bronchitis   . Hypertension   . Renal disorder    kidney failure, dialysis   Past Surgical History:  Procedure Laterality Date  . DIALYSIS FISTULA CREATION       A IV Location/Drains/Wounds Patient Lines/Drains/Airways Status   Active Line/Drains/Airways    Name:   Placement date:   Placement time:   Site:   Days:   Peripheral IV 11/21/18 Right Hand   11/21/18    1507    Hand   less than 1   Fistula / Graft Left Upper arm   05/17/18    -    Upper arm   188          Intake/Output Last 24 hours No intake or output data in the 24 hours ending 11/21/18 1648  Labs/Imaging Results for orders placed or performed during the hospital encounter of 11/21/18 (from the past 48 hour(s))  Glucose, capillary     Status: Abnormal   Collection Time: 11/21/18  2:39 PM  Result Value Ref Range   Glucose-Capillary 54 (L) 70 - 99 mg/dL  Comprehensive metabolic panel     Status: Abnormal   Collection Time: 11/21/18  2:55 PM  Result Value Ref Range   Sodium 137 135 - 145 mmol/L   Potassium 5.0 3.5 - 5.1 mmol/L   Chloride 97 (L) 98 - 111 mmol/L   CO2 20 (L) 22 - 32 mmol/L   Glucose, Bld 68 (L) 70 - 99 mg/dL   BUN 51 (H) 6 - 20 mg/dL   Creatinine, Ser 11.05 (H) 0.61 - 1.24 mg/dL   Calcium 8.8 (L) 8.9 - 10.3 mg/dL   Total Protein 7.9 6.5 - 8.1 g/dL   Albumin 4.0 3.5 - 5.0 g/dL   AST 37 15 - 41 U/L   ALT 16 0 - 44 U/L   Alkaline Phosphatase 119 38 - 126 U/L   Total Bilirubin 0.9 0.3 - 1.2 mg/dL   GFR calc non Af Amer 5 (L) >60 mL/min   GFR calc Af Amer 5 (L) >60 mL/min   Anion gap 20 (H) 5 - 15    Comment: Performed at [facility], [address] [place], [place] [address]  Lactic acid, plasma     Status: None   Collection Time: 11/21/18  2:55 PM  Result Value Ref Range   Lactic Acid, Venous 1.3 0.5 -  1.9 mmol/L    Comment: Performed at [facility], [address] [place], [place] [address]  CBC with Differential     Status: Abnormal   Collection Time: 11/21/18  2:55 PM  Result Value Ref Range   WBC 4.3 4.0 - 10.5 K/uL   RBC 4.31 4.22 - 5.81 MIL/uL   Hemoglobin 12.9 (L) 13.0 - 17.0 g/dL   HCT 41.6 39.0 - 52.0 %   MCV 96.5 80.0 - 100.0 fL   MCH 29.9 26.0 - 34.0 pg   MCHC 31.0 30.0 - 36.0 g/dL   RDW 17.1 (H) 11.5 - 15.5 %   Platelets 203 150 - 400 K/uL   nRBC 0.0 0.0 - 0.2 %   Neutrophils Relative % 74 %   Neutro Abs 3.2 1.7 - 7.7 K/uL   Lymphocytes Relative 12 %   Lymphs Abs 0.5 (L) 0.7 - 4.0 K/uL   Monocytes Relative 11 %   Monocytes Absolute 0.5 0.1 - 1.0 K/uL   Eosinophils Relative 1 %   Eosinophils Absolute 0.1 0.0 - 0.5 K/uL   Basophils Relative 1 %   Basophils Absolute 0.0 0.0 - 0.1 K/uL   Immature Granulocytes 1 %   Abs Immature Granulocytes 0.02 0.00 - 0.07 K/uL    Comment: Performed at [facility], [address]  [place], [place] [address]  Protime-INR     Status: None   Collection Time: 11/21/18  2:55 PM  Result Value Ref Range   Prothrombin Time 14.0 11.4 - 15.2 seconds   INR 1.1 0.8 - 1.2    Comment: (NOTE) INR goal varies based on device and disease states. Performed at [facility], [address] [place], [place] [address]   Influenza panel by PCR (type A & B)     Status: Abnormal   Collection Time: 11/21/18  2:55 PM  Result Value Ref Range   Influenza A By PCR NEGATIVE NEGATIVE   Influenza B By PCR POSITIVE (A) NEGATIVE    Comment: (NOTE) The Xpert Xpress Flu assay is intended as an aid in the diagnosis of  influenza and should not be used as a sole basis for treatment.  This  assay is FDA approved for nasopharyngeal swab specimens only. Nasal  washings and aspirates are unacceptable for Xpert Xpress Flu testing. Performed at [facility], [address] [place], [place] [address]   Glucose, capillary     Status: Abnormal   Collection Time: 11/21/18  3:09 PM  Result Value Ref Range   Glucose-Capillary 126 (H) 70 - 99 mg/dL  Glucose, capillary     Status: None   Collection Time: 11/21/18  4:11 PM  Result Value Ref Range   Glucose-Capillary 85 70 - 99 mg/dL  Glucose, capillary     Status: None   Collection Time: 11/21/18  4:43 PM  Result Value Ref Range   Glucose-Capillary 77 70 - 99 mg/dL   Dg Chest 2 View  Result Date: 11/19/2018 CLINICAL DATA:  Dyspnea EXAM: CHEST - 2 VIEW COMPARISON:  11/04/2018 chest radiograph. FINDINGS: Vascular stent overlies the left axilla. Stable cardiomediastinal silhouette with moderate cardiomegaly. No pneumothorax. No pleural effusion. No overt pulmonary edema. No acute consolidative airspace disease. IMPRESSION: Stable moderate cardiomegaly without overt pulmonary edema. No active pulmonary disease. Electronically Signed   By: Ilona Sorrel M.D.   On: 11/19/2018 20:38   Dg Chest Portable 1 View  Result Date: 11/21/2018 CLINICAL  DATA:  Cough and fever EXAM: PORTABLE CHEST  1 VIEW COMPARISON:  November 19, 2018 FINDINGS: There is no appreciable edema or consolidation. Heart is enlarged, stable, with pulmonary vascularity within normal limits. Stent is noted in the left axillary region. No adenopathy. There is aortic atherosclerosis. No bone lesions. IMPRESSION: Stable cardiomegaly without frank edema or consolidation. Aortic Atherosclerosis (ICD10-I70.0). Electronically Signed   By: Lowella Grip III M.D.   On: 11/21/2018 15:45    Pending Labs Unresulted Labs (From admission, onward)    Start     Ordered   11/21/18 1434  Culture, blood (Routine x 2)  BLOOD CULTURE X 2,   STAT     11/21/18 1433          Vitals/Pain Today's Vitals   11/21/18 1432 11/21/18 1500 11/21/18 1530 11/21/18 1607  BP:  (!) 142/67 (!) 164/75   Pulse:  94 87   Resp:  (!) 28 (!) 22   Temp:    (!) 101.2 F (38.4 C)  TempSrc:    Oral  SpO2:  93% 94%   Weight: 72.6 kg     Height: 5\' 6"  (1.676 m)     PainSc:        Isolation Precautions Droplet precaution  Medications Medications  metroNIDAZOLE (FLAGYL) IVPB 500 mg (500 mg Intravenous New Bag/Given 11/21/18 1608)  vancomycin (VANCOCIN) IVPB 1000 mg/200 mL premix (has no administration in time range)  dextrose 50 % solution 12.5 g (12.5 g Intravenous Given 11/21/18 1507)  acetaminophen (TYLENOL) tablet 1,000 mg (1,000 mg Oral Given 11/21/18 1507)  ceFEPIme (MAXIPIME) 2 g in sodium chloride 0.9 % 100 mL IVPB (0 g Intravenous Stopped 11/21/18 1606)  oseltamivir (TAMIFLU) capsule 30 mg (30 mg Oral Given 11/21/18 1640)    Mobility walks Low fall risk   Focused Assessments Cardiac Assessment Handoff:  Cardiac Rhythm: Normal sinus rhythm Lab Results  Component Value Date   TROPONINI 0.07 ([facility]) 11/19/2018   No results found for: DDIMER Does the Patient currently have chest pain? No     R Recommendations: See Admitting Provider Note  Report given to:   Additional Notes:   Girlfriend brought patient due to AMS since last night after being discharged from hospital. States that he was not speaking, eating, drinking.   Pt dialysis patient, left arm restricted to dialysis access only. Dialysis last 11/20/18. Pt does not make urine. Flu positive.

## 2018-11-21 NOTE — ED Notes (Signed)
X-ray at bedside

## 2018-11-21 NOTE — ED Notes (Signed)
ED Provider at bedside. 

## 2018-11-21 NOTE — ED Triage Notes (Signed)
Girlfriend states pt was DC from hospital yesterday. States pt hasn't been talking since then because feels too bad. Pt told girlfriend he doesn't know what he was admitted for or where his prescriptions are. Pt is dialysis pt. Pt talking in triage. Fistula L arm.

## 2018-11-21 NOTE — ED Notes (Signed)
Report to Sandra, RN

## 2018-11-21 NOTE — H&P (Signed)
[facility] [place] NAME: Jeremy [name]    MR#:  [id number]  DATE OF BIRTH:  [birth year]  DATE OF ADMISSION:  11/21/2018  PRIMARY CARE PHYSICIAN: Patient, No Pcp Per   REQUESTING/REFERRING PHYSICIAN:   CHIEF COMPLAINT:   Chief Complaint  Patient presents with  . Altered Mental Status  . Fever    HISTORY OF PRESENT ILLNESS: Jeremy [name]  is a 58 y.o. male with a known history of end-stage renal disease, hypertension, discharged from the hospital yesterday for acute hyperkalemia, presents with generalized weakness, fatigue, ill feeling, cough, in the emergency room patient was found to have a fever of 102.7, noted tachypnea, blood sugar of 60, bicarb 20, creatinine 11, influenza B positive, hospitalist asked to admit, patient evaluated in the emergency room, noted to be in no apparent distress, resting comfortably in bed, on oxygen via nasal cannula, patient is now being admitted for acute influenza B infection, acute hypoglycemia.  PAST MEDICAL HISTORY:   Past Medical History:  Diagnosis Date  . Bronchitis   . Hypertension   . Renal disorder    kidney failure, dialysis    PAST SURGICAL HISTORY:  Past Surgical History:  Procedure Laterality Date  . DIALYSIS FISTULA CREATION      SOCIAL HISTORY:  Social History   Tobacco Use  . Smoking status: Current Every Day Smoker    Packs/day: 1.00    Types: Cigarettes  . Smokeless tobacco: Never Used  Substance Use Topics  . Alcohol use: Never    Frequency: Never    FAMILY HISTORY:  Family History  Problem Relation Age of Onset  . Hypertension Mother     DRUG ALLERGIES: No Known Allergies  REVIEW OF SYSTEMS:   CONSTITUTIONAL: +fever, fatigue, weakness.  EYES: No blurred or double vision.  EARS, NOSE, AND THROAT: No tinnitus or ear pain.  RESPIRATORY: + cough, shortness of breath CARDIOVASCULAR: No chest pain, orthopnea, edema.  GASTROINTESTINAL: No nausea, vomiting, diarrhea or  abdominal pain.  GENITOURINARY: No dysuria, hematuria.  ENDOCRINE: No polyuria, nocturia,  HEMATOLOGY: No anemia, easy bruising or bleeding SKIN: No rash or lesion. MUSCULOSKELETAL: No joint pain or arthritis.   NEUROLOGIC: No tingling, numbness, weakness.  PSYCHIATRY: No anxiety or depression.   MEDICATIONS AT HOME:  Prior to Admission medications   Medication Sig Start Date End Date Taking? Authorizing Provider  albuterol (PROVENTIL HFA;VENTOLIN HFA) 108 (90 Base) MCG/ACT inhaler Inhale 2 puffs into the lungs every 6 (six) hours as needed for wheezing or shortness of breath. 11/19/18  Yes Nena Polio, MD  amLODipine (NORVASC) 10 MG tablet Take 10 mg by mouth daily. 03/29/18  Yes [provider]  gabapentin (NEURONTIN) 300 MG capsule Take 1 capsule (300 mg total) by mouth daily as needed. 05/19/18 11/21/18 Yes Sainani, Belia Heman, MD  guaiFENesin-dextromethorphan (ROBITUSSIN DM) 100-10 MG/5ML syrup Take 5 mLs by mouth every 4 (four) hours as needed for cough. 11/20/18  Yes Vaughan Basta, MD  metoprolol tartrate (LOPRESSOR) 25 MG tablet Take 25 mg by mouth daily. 03/29/18  Yes [provider]  Multiple Vitamins-Iron (MULTI-DAY PLUS IRON) TABS Take 1 tablet by mouth daily.   Yes [provider]  sevelamer carbonate (RENVELA) 800 MG tablet Take 1,600 mg by mouth 3 (three) times daily with meals. 03/02/18  Yes [provider]  VELPHORO 500 MG chewable tablet Chew 500 mg by mouth 6 (six) times daily. 05/27/18  Yes [provider]  PHYSICAL EXAMINATION:   VITAL SIGNS: Blood pressure (!) 164/75, pulse 87, temperature (!) 101.2 F (38.4 C), temperature source Oral, resp. rate (!) 22, height 5\' 6"  (1.676 m), weight 72.6 kg, SpO2 94 %.  GENERAL:  58 y.o.-year-old patient lying in the bed with no acute distress.  Frail-appearing EYES: Pupils equal, round, reactive to light and accommodation. No scleral icterus. Extraocular muscles intact.  HEENT:  Head atraumatic, normocephalic. Oropharynx and nasopharynx clear.  NECK:  Supple, no jugular venous distention. No thyroid enlargement, no tenderness.  LUNGS: Mild rhonchi bilaterally. No use of accessory muscles of respiration.  CARDIOVASCULAR: S1, S2 normal. No murmurs, rubs, or gallops.  ABDOMEN: Soft, nontender, nondistended. Bowel sounds present. No organomegaly or mass.  EXTREMITIES: No pedal edema, cyanosis, or clubbing.  NEUROLOGIC: Cranial nerves II through XII are intact. Muscle strength 5/5 in all extremities. Sensation intact. Gait not checked.  PSYCHIATRIC: The patient is mildly lethargic, oriented  x 3.  SKIN: No obvious rash, lesion, or ulcer.   LABORATORY PANEL:   CBC Recent Labs  Lab 11/19/18 2038 11/21/18 1455  WBC 4.1 4.3  HGB 11.0* 12.9*  HCT 35.5* 41.6  PLT 229 203  MCV 98.3 96.5  MCH 30.5 29.9  MCHC 31.0 31.0  RDW 17.2* 17.1*  LYMPHSABS  --  0.5*  MONOABS  --  0.5  EOSABS  --  0.1  BASOSABS  --  0.0   ------------------------------------------------------------------------------------------------------------------  Chemistries  Recent Labs  Lab 11/19/18 2038 11/21/18 1455  NA 137 137  K 6.1* 5.0  CL 99 97*  CO2 18* 20*  GLUCOSE 112* 68*  BUN 73* 51*  CREATININE 12.82* 11.05*  CALCIUM 7.8* 8.8*  AST 30 37  ALT 17 16  ALKPHOS 121 119  BILITOT 1.9* 0.9   ------------------------------------------------------------------------------------------------------------------ estimated creatinine clearance is 6.7 mL/min (A) (by C-G formula based on SCr of 11.05 mg/dL (H)). ------------------------------------------------------------------------------------------------------------------ Recent Labs    11/19/18 2038  TSH 1.954     Coagulation profile Recent Labs  Lab 11/21/18 1455  INR 1.1   ------------------------------------------------------------------------------------------------------------------- No results for input(s): DDIMER in  the last 72 hours. -------------------------------------------------------------------------------------------------------------------  Cardiac Enzymes Recent Labs  Lab 11/19/18 2038  TROPONINI 0.07*   ------------------------------------------------------------------------------------------------------------------ Invalid input(s): POCBNP  ---------------------------------------------------------------------------------------------------------------  Urinalysis No results found for: COLORURINE, APPEARANCEUR, LABSPEC, PHURINE, GLUCOSEU, HGBUR, BILIRUBINUR, KETONESUR, PROTEINUR, UROBILINOGEN, NITRITE, LEUKOCYTESUR   RADIOLOGY: Dg Chest 2 View  Result Date: 11/19/2018 CLINICAL DATA:  Dyspnea EXAM: CHEST - 2 VIEW COMPARISON:  11/04/2018 chest radiograph. FINDINGS: Vascular stent overlies the left axilla. Stable cardiomediastinal silhouette with moderate cardiomegaly. No pneumothorax. No pleural effusion. No overt pulmonary edema. No acute consolidative airspace disease. IMPRESSION: Stable moderate cardiomegaly without overt pulmonary edema. No active pulmonary disease. Electronically Signed   By: Ilona Sorrel M.D.   On: 11/19/2018 20:38   Dg Chest Portable 1 View  Result Date: 11/21/2018 CLINICAL DATA:  Cough and fever EXAM: PORTABLE CHEST 1 VIEW COMPARISON:  November 19, 2018 FINDINGS: There is no appreciable edema or consolidation. Heart is enlarged, stable, with pulmonary vascularity within normal limits. Stent is noted in the left axillary region. No adenopathy. There is aortic atherosclerosis. No bone lesions. IMPRESSION: Stable cardiomegaly without frank edema or consolidation. Aortic Atherosclerosis (ICD10-I70.0). Electronically Signed   By: Lowella Grip III M.D.   On: 11/21/2018 15:45    EKG: Orders placed or performed during the hospital encounter of 11/21/18  . EKG 12-Lead  . EKG 12-Lead    IMPRESSION AND PLAN: *Acute influenza B  infection Admit to regular nursing for  bed, supplemental oxygen with weaning as tolerated, Tamiflu for 5-day course, supportive care  *Acute hypoglycemia Most likely secondary to poor p.o. intake from above Encourage p.o. intake, IV fluids with dextrose, Accu-Cheks every 2 hours for the next 8 hours  *Chronic end-stage renal disease on hemodialysis Nephrology consulted for hemodialysis needs  *Chronic hypertension Stable continue amlodipine and metoprolol  *Chronic frail appearance Dietary consulted   All the records are reviewed and case discussed with ED provider. Management plans discussed with the patient, family and they are in agreement.  CODE STATUS: Full Code Status History    Date Active Date Inactive Code Status Order ID Comments User Context   11/20/2018 0122 11/20/2018 1923 Full Code [id number]  Harrie Foreman, MD Inpatient   09/22/2018 1841 09/23/2018 2017 Full Code [id number]  Saundra Shelling, MD Inpatient   05/16/2018 1709 05/19/2018 1433 Full Code [id number]  Vaughan Basta, MD Inpatient       TOTAL TIME TAKING CARE OF THIS PATIENT: 40 minutes.    Avel Peace  M.D on 11/21/2018   Between 7am to 6pm - Pager - [phone]  After 6pm go to [email or web address] - password [email or web address] [facility]  Office  [phone]  CC: Primary care physician; Patient, No Pcp Per   Note: This dictation was prepared with Dragon dictation along with smaller phrase technology. Any transcriptional errors that result from this process are unintentional.

## 2018-11-22 DIAGNOSIS — A4189 Other specified sepsis: Secondary | ICD-10-CM | POA: Diagnosis not present

## 2018-11-22 DIAGNOSIS — E162 Hypoglycemia, unspecified: Secondary | ICD-10-CM | POA: Diagnosis not present

## 2018-11-22 LAB — BASIC METABOLIC PANEL
Anion gap: 17 — ABNORMAL HIGH (ref 5–15)
BUN: 59 mg/dL — AB (ref 6–20)
CO2: 20 mmol/L — ABNORMAL LOW (ref 22–32)
Calcium: 8.8 mg/dL — ABNORMAL LOW (ref 8.9–10.3)
Chloride: 100 mmol/L (ref 98–111)
Creatinine, Ser: 13.49 mg/dL — ABNORMAL HIGH (ref 0.61–1.24)
GFR calc Af Amer: 4 mL/min — ABNORMAL LOW (ref >60)
GFR calc non Af Amer: 4 mL/min — ABNORMAL LOW (ref >60)
Glucose, Bld: 89 mg/dL (ref 70–99)
Potassium: 5 mmol/L (ref 3.5–5.1)
Sodium: 137 mmol/L (ref 135–145)

## 2018-11-22 MED ORDER — PENTAFLUOROPROP-TETRAFLUOROETH EX AERO: 1.0000 "application " | INHALATION_SPRAY | CUTANEOUS | Status: DC | PRN

## 2018-11-22 MED ORDER — OSELTAMIVIR PHOSPHATE 30 MG PO CAPS: 30.0000 mg | ORAL_CAPSULE | ORAL | 0 refills | Status: DC

## 2018-11-22 MED ORDER — SODIUM CHLORIDE 0.9 % IV SOLN: 100.0000 mL | INTRAVENOUS | Status: DC | PRN

## 2018-11-22 MED ORDER — HEPARIN SODIUM (PORCINE) 1000 UNIT/ML DIALYSIS: 20.0000 [IU]/kg | INTRAMUSCULAR | Status: DC | PRN

## 2018-11-22 MED ORDER — ALTEPLASE 2 MG IJ SOLR: 2.0000 mg | Freq: Once | INTRAMUSCULAR | Status: DC | PRN

## 2018-11-22 MED ORDER — GABAPENTIN 100 MG PO CAPS: 100.0000 mg | ORAL_CAPSULE | Freq: Every day | ORAL | 0 refills | Status: DC | PRN

## 2018-11-22 MED ORDER — LIDOCAINE HCL (PF) 1 % IJ SOLN: 5.0000 mL | INTRAMUSCULAR | Status: DC | PRN

## 2018-11-22 MED ORDER — LIDOCAINE-PRILOCAINE 2.5-2.5 % EX CREA: 1.0000 "application " | TOPICAL_CREAM | CUTANEOUS | Status: DC | PRN

## 2018-11-22 MED ORDER — HEPARIN SODIUM (PORCINE) 1000 UNIT/ML DIALYSIS: 1000.0000 [IU] | INTRAMUSCULAR | Status: DC | PRN

## 2018-11-22 MED ORDER — CHLORHEXIDINE GLUCONATE CLOTH 2 % EX PADS: 6.0000 | MEDICATED_PAD | Freq: Every day | CUTANEOUS | Status: DC

## 2018-11-22 NOTE — TOC Initial Note (Signed)
Transition of Care [facility]) - Initial/Assessment Note    Patient Details  Name: Jeremy [name] MRN: [id number] Date of Birth: [birth year]  Transition of [facility]) CM/SW Contact:    Beverly Sessions, RN Phone Number: 11/22/2018, 11:31 AM  Clinical Narrative:                 Patient to discharge home today.  Patient provides limited information during assessment.  [facility] HD liaison notified of admission and discharge. Patient states that he lives alone. Patient states he provides his own transportation and denies any issues.  [facility].  Denies issues with obtaining medications.  Patient states that he does not have a PCP,  Patient refuses for new patient appointment for PCP to be made. Patient states that he is independent of all ADL's  Expected Discharge Plan: Home/Self Care Barriers to Discharge: Barriers Resolved   Patient Goals and CMS Choice Patient states their goals for this hospitalization and ongoing recovery are:: "I don't know whats going on"      Expected Discharge Plan and Services Expected Discharge Plan: Home/Self Care     Living arrangements for the past 2 months: Single Family Home Expected Discharge Date: 11/22/18                        Prior Living Arrangements/Services Living arrangements for the past 2 months: Single Family Home Lives with:: Self   Do you feel safe going back to the place where you live?: Yes               Activities of Daily Living Home Assistive Devices/Equipment: None ADL Screening (condition at time of admission) Patient's cognitive ability adequate to safely complete daily activities?: Yes Is the patient deaf or have difficulty hearing?: No Does the patient have difficulty seeing, even when wearing glasses/contacts?: No Does the patient have difficulty concentrating, remembering, or making decisions?: No Patient able to express need for assistance with ADLs?: Yes Does the patient have difficulty dressing or bathing?:  No Independently performs ADLs?: Yes (appropriate for developmental age) Does the patient have difficulty walking or climbing stairs?: Yes Weakness of Legs: None Weakness of Arms/Hands: None  Permission Sought/Granted                  Emotional Assessment Appearance:: Appears stated age Attitude/Demeanor/Rapport: Avoidant Affect (typically observed): Quiet Orientation: : Oriented to Self, Oriented to Place, Oriented to  Time, Oriented to Situation      Admission diagnosis:  Hypoglycemia [E16.2] Influenza B [J10.1] Sepsis ([place]) [A41.9] Patient Active Problem List   Diagnosis Date Noted  . Influenza B 11/21/2018  . Pulmonary edema 09/22/2018  . Hyperkalemia 05/16/2018   PCP:  Patient, No Pcp Per Pharmacy:   [id number] , [place] [address] Phone: [phone] Fax: [phone]     Social Determinants of Health (SDOH) Interventions    Readmission Risk Interventions 30 Day Unplanned Readmission Risk Score     ED to Hosp-Admission (Current) from 11/21/2018 in [place]  30 Day Unplanned Readmission Risk Score (%)  30 Filed at 11/22/2018 0801     This score is the patient's risk of an unplanned readmission within 30 days of being discharged (0 -100%). The score is based on dignosis, age, lab data, medications, orders, and past utilization.   Low:  0-14.9   Medium: 15-21.9   High: 22-29.9   Extreme:  38 and above       Readmission Risk Prevention Plan 11/22/2018  Transportation Screening Complete  Medication Review Press photographer) Complete  PCP or Specialist appointment within 3-5 days of discharge Patient refused  [place] or [place] (No Data)  SW Recovery Care/Counseling Consult (No Data)  Palliative Care Screening Not [place] Not Applicable  Some recent data might be hidden

## 2018-11-22 NOTE — Discharge Summary (Signed)
[facility] [place] NAME: Jeremy [name]    MR#:  [id number]  DATE OF BIRTH:  1961/05/03  DATE OF ADMISSION:  11/21/2018 ADMITTING PHYSICIAN: Gorden Harms, MD  DATE OF DISCHARGE: 11/22/2018  PRIMARY CARE PHYSICIAN: Patient, No Pcp Per    ADMISSION DIAGNOSIS:  Hypoglycemia [E16.2] Influenza B [J10.1] Sepsis ([place]) [A41.9]  DISCHARGE DIAGNOSIS:  Active Problems:   Influenza B   SECONDARY DIAGNOSIS:   Past Medical History:  Diagnosis Date  . Bronchitis   . Hypertension   . Renal disorder    kidney failure, dialysis    HOSPITAL COURSE:   58 year old male with end-stage renal disease on hemodialysis who presented to the emergency room due to fever.   1.  Sepsis: Patient presented with tachypnea and fever Sepsis is due to acute influenza B infection. Sepsis has resolved.  2.  Influenza B: Patient will continue Tamiflu as dosed renally.  3.  Hypoglycemia due to poor p.o. intake  4.  End-stage renal disease on hemodialysis: Patient will continue routine schedule of dialysis.  5.  Essential hypertension: Patient should continue Norvasc and metoprolol.  6. Tobacco dependence: Patient is encouraged to quit smoking and willing to attempt to quit was assessed. Patient NOT highly motivated.Counseling was provided for 4 minutes.   DISCHARGE CONDITIONS AND DIET:  Renal diet Stable   CONSULTS OBTAINED:  Treatment Team:  Murlean Iba, MD  DRUG ALLERGIES:  No Known Allergies  DISCHARGE MEDICATIONS:   Allergies as of 11/22/2018   No Known Allergies     Medication List    TAKE these medications   albuterol 108 (90 Base) MCG/ACT inhaler Commonly known as:  PROVENTIL HFA;VENTOLIN HFA Inhale 2 puffs into the lungs every 6 (six) hours as needed for wheezing or shortness of breath.   amLODipine 10 MG tablet Commonly known as:  NORVASC Take 10 mg by mouth daily.   gabapentin 100 MG capsule Commonly known as:   NEURONTIN Take 1 capsule (100 mg total) by mouth daily as needed (pain). What changed:    medication strength  how much to take  reasons to take this   guaiFENesin-dextromethorphan 100-10 MG/5ML syrup Commonly known as:  ROBITUSSIN DM Take 5 mLs by mouth every 4 (four) hours as needed for cough.   metoprolol tartrate 25 MG tablet Commonly known as:  LOPRESSOR Take 25 mg by mouth daily.   Multi-Day Plus Iron Tabs Take 1 tablet by mouth daily.   oseltamivir 30 MG capsule Commonly known as:  TAMIFLU Take 1 capsule (30 mg total) by mouth every other day. Only on days after dialysis   sevelamer carbonate 800 MG tablet Commonly known as:  RENVELA Take 1,600 mg by mouth 3 (three) times daily with meals.   Velphoro 500 MG chewable tablet Generic drug:  sucroferric oxyhydroxide Chew 500 mg by mouth 6 (six) times daily.         Today   CHIEF COMPLAINT:  Doing better this am   VITAL SIGNS:  Blood pressure (!) 126/54, pulse 88, temperature 99.7 F (37.6 C), temperature source Oral, resp. rate (!) 22, height 5\' 6"  (1.676 m), weight 72.6 kg, SpO2 92 %.   REVIEW OF SYSTEMS:  Review of Systems  Constitutional: Negative.  Negative for chills, fever and malaise/fatigue.  HENT: Negative.  Negative for ear discharge, ear pain, hearing loss, nosebleeds and sore throat.   Eyes: Negative.  Negative for blurred vision and pain.  Respiratory: Positive for cough. Negative  for hemoptysis, shortness of breath and wheezing.   Cardiovascular: Negative.  Negative for chest pain, palpitations and leg swelling.  Gastrointestinal: Negative.  Negative for abdominal pain, blood in stool, diarrhea, nausea and vomiting.  Genitourinary: Negative.  Negative for dysuria.  Musculoskeletal: Negative.  Negative for back pain.  Skin: Negative.   Neurological: Negative for dizziness, tremors, speech change, focal weakness, seizures and headaches.  Endo/Heme/Allergies: Negative.  Does not bruise/bleed  easily.  Psychiatric/Behavioral: Negative.  Negative for depression, hallucinations and suicidal ideas.     PHYSICAL EXAMINATION:  GENERAL:  58 y.o.-year-old patient lying in the bed with no acute distress.  NECK:  Supple, no jugular venous distention. No thyroid enlargement, no tenderness.  LUNGS: Normal breath sounds bilaterally, no wheezing, rales,rhonchi  No use of accessory muscles of respiration.  CARDIOVASCULAR: S1, S2 normal. No murmurs, rubs, or gallops.  ABDOMEN: Soft, non-tender, non-distended. Bowel sounds present. No organomegaly or mass.  EXTREMITIES: No pedal edema, cyanosis, or clubbing.  PSYCHIATRIC: The patient is alert and oriented x 3.  SKIN: No obvious rash, lesion, or ulcer.   DATA REVIEW:   CBC Recent Labs  Lab 11/21/18 1455  WBC 4.3  HGB 12.9*  HCT 41.6  PLT 203    Chemistries  Recent Labs  Lab 11/21/18 1455 11/22/18 0419  NA 137 137  K 5.0 5.0  CL 97* 100  CO2 20* 20*  GLUCOSE 68* 89  BUN 51* 59*  CREATININE 11.05* 13.49*  CALCIUM 8.8* 8.8*  AST 37  --   ALT 16  --   ALKPHOS 119  --   BILITOT 0.9  --     Cardiac Enzymes Recent Labs  Lab 11/19/18 2038  TROPONINI 0.07*    Microbiology Results  @[email or web address] @  RADIOLOGY:  Dg Chest Portable 1 View  Result Date: 11/21/2018 CLINICAL DATA:  Cough and fever EXAM: PORTABLE CHEST 1 VIEW COMPARISON:  November 19, 2018 FINDINGS: There is no appreciable edema or consolidation. Heart is enlarged, stable, with pulmonary vascularity within normal limits. Stent is noted in the left axillary region. No adenopathy. There is aortic atherosclerosis. No bone lesions. IMPRESSION: Stable cardiomegaly without frank edema or consolidation. Aortic Atherosclerosis (ICD10-I70.0). Electronically Signed   By: Lowella Grip III M.D.   On: 11/21/2018 15:45      Allergies as of 11/22/2018   No Known Allergies     Medication List    TAKE these medications   albuterol 108 (90 Base) MCG/ACT inhaler Commonly  known as:  PROVENTIL HFA;VENTOLIN HFA Inhale 2 puffs into the lungs every 6 (six) hours as needed for wheezing or shortness of breath.   amLODipine 10 MG tablet Commonly known as:  NORVASC Take 10 mg by mouth daily.   gabapentin 100 MG capsule Commonly known as:  NEURONTIN Take 1 capsule (100 mg total) by mouth daily as needed (pain). What changed:    medication strength  how much to take  reasons to take this   guaiFENesin-dextromethorphan 100-10 MG/5ML syrup Commonly known as:  ROBITUSSIN DM Take 5 mLs by mouth every 4 (four) hours as needed for cough.   metoprolol tartrate 25 MG tablet Commonly known as:  LOPRESSOR Take 25 mg by mouth daily.   Multi-Day Plus Iron Tabs Take 1 tablet by mouth daily.   oseltamivir 30 MG capsule Commonly known as:  TAMIFLU Take 1 capsule (30 mg total) by mouth every other day. Only on days after dialysis   sevelamer carbonate 800 MG tablet Commonly known as:  RENVELA  Take 1,600 mg by mouth 3 (three) times daily with meals.   Velphoro 500 MG chewable tablet Generic drug:  sucroferric oxyhydroxide Chew 500 mg by mouth 6 (six) times daily.         Management plans discussed with the patient and he is in agreement. Stable for discharge   Patient should follow up with pcp  CODE STATUS:     Code Status Orders  (From admission, onward)         Start     Ordered   11/21/18 1857  Full code  Continuous     11/21/18 1857        Code Status History    Date Active Date Inactive Code Status Order ID Comments User Context   11/20/2018 0122 11/20/2018 1923 Full Code [id number]  Harrie Foreman, MD Inpatient   09/22/2018 1841 09/23/2018 2017 Full Code [id number]  Saundra Shelling, MD Inpatient   05/16/2018 1709 05/19/2018 1433 Full Code [id number]  Vaughan Basta, MD Inpatient      TOTAL TIME TAKING CARE OF THIS PATIENT: 38 minutes.    Note: This dictation was prepared with Dragon dictation along with smaller phrase technology.  Any transcriptional errors that result from this process are unintentional.  Monteen Toops M.D on 11/22/2018 at 12:20 PM  Between 7am to 6pm - Pager - [phone] After 6pm go to [email or web address] - password [email or web address] [facility]  Office  [phone]  CC: Primary care physician; Patient, No Pcp Per

## 2018-11-22 NOTE — Progress Notes (Signed)
Machine setup in patient room, tx procedure explained and machine test in progress. At that point, pt agreeing to dialysis. Following completion of the machine test, pt now refusing dialysis. Pt was explained risks of missing dialysis tx. RN Rodman Key at bedside educating pt as well. Pt's fiance speaking with patient.

## 2018-11-22 NOTE — Discharge Planning (Signed)
Patient IV removed and preceeded towards discharge, since patient refused dialysis.  Myself, dialysis RN and fiance in room during refusal.  We educated and defined clear consequences for not having dialysis...including eventual death. When asked "why patient did not want dialysis? - Patient stated, "cause I don't feel well." We educated that this is true because he needs dialysis to clean his body of toxins and that he would not get better until it gets done. Yet, patient still refused.  RN assessment revealed lethargy and severe weakness - however patient did appear oriented x4 and able to make the decision to NOT have dialysis.  Discharge papers given, explained and educated.  Patient informed he would need to get to FU dialysis appt as soon as possible. Scripts printed and given.  Wheeled to front and fiancee drove home in car.

## 2018-11-22 NOTE — Progress Notes (Signed)
[facility]  ROUNDING NOTE   Subjective:  Patient seen at bedside. He is positive for influenza B. Due for dialysis today per his usual schedule.   Objective:  Vital signs in last 24 hours:  Temp:  [98.9 F (37.2 C)-102.7 F (39.3 C)] 99.7 F (37.6 C) (03/16 0526) Pulse Rate:  [76-94] 88 (03/16 0905) Resp:  [20-28] 22 (03/16 0526) BP: (116-164)/(52-75) 126/54 (03/16 0904) SpO2:  [89 %-100 %] 92 % (03/16 0526) Weight:  [72.6 kg] 72.6 kg (03/15 1432)  Weight change:  Filed Weights   11/21/18 1432  Weight: 72.6 kg    Intake/Output: I/O last 3 completed shifts: In: 382.4 [I.V.:382.4] Out: -    Intake/Output this shift:  No intake/output data recorded.  Physical Exam: General: No acute distress  Head: Normocephalic, atraumatic. Moist oral mucosal membranes  Eyes: Anicteric  Neck: Supple, trachea midline  Lungs:  Clear to auscultation, normal effort  Heart: S1S2 no rubs  Abdomen:  Soft, nontender, bowel sounds present  Extremities: Trace peripheral edema.  Neurologic: Awake, alert, following commands  Skin: No lesions       Basic Metabolic Panel: Recent Labs  Lab 11/19/18 2038 11/21/18 1455 11/22/18 0419  NA 137 137 137  K 6.1* 5.0 5.0  CL 99 97* 100  CO2 18* 20* 20*  GLUCOSE 112* 68* 89  BUN 73* 51* 59*  CREATININE 12.82* 11.05* 13.49*  CALCIUM 7.8* 8.8* 8.8*    Liver Function Tests: Recent Labs  Lab 11/19/18 2038 11/21/18 1455  AST 30 37  ALT 17 16  ALKPHOS 121 119  BILITOT 1.9* 0.9  PROT 7.3 7.9  ALBUMIN 3.8 4.0   No results for input(s): LIPASE, AMYLASE in the last 168 hours. No results for input(s): AMMONIA in the last 168 hours.  CBC: Recent Labs  Lab 11/19/18 2038 11/21/18 1455  WBC 4.1 4.3  NEUTROABS  --  3.2  HGB 11.0* 12.9*  HCT 35.5* 41.6  MCV 98.3 96.5  PLT 229 203    Cardiac Enzymes: Recent Labs  Lab 11/19/18 2038  TROPONINI 0.07*    BNP: Invalid input(s): POCBNP  CBG: Recent Labs  Lab  11/21/18 1439 11/21/18 1509 11/21/18 1611 11/21/18 1643  GLUCAP 54* 126* 85 49    Microbiology: Results for orders placed or performed during the hospital encounter of 11/21/18  Culture, blood (Routine x 2)     Status: None (Preliminary result)   Collection Time: 11/21/18  2:55 PM  Result Value Ref Range Status   Specimen Description BLOOD BLOOD RIGHT ARM  Final   Special Requests   Final    BOTTLES DRAWN AEROBIC AND ANAEROBIC Blood Culture results may not be optimal due to an inadequate volume of blood received in culture bottles   Culture   Final    NO GROWTH < 24 HOURS Performed at [facility], [address], [place], [place] [address]    Report Status PENDING  Incomplete  Culture, blood (Routine x 2)     Status: None (Preliminary result)   Collection Time: 11/21/18  2:55 PM  Result Value Ref Range Status   Specimen Description BLOOD BLOOD RIGHT HAND  Final   Special Requests   Final    BOTTLES DRAWN AEROBIC AND ANAEROBIC Blood Culture adequate volume   Culture   Final    NO GROWTH < 24 HOURS Performed at [facility], [address], [place], [place] [address]    Report Status PENDING  Incomplete  Coagulation Studies: Recent Labs    11/21/18 1455  LABPROT 14.0  INR 1.1    Urinalysis: No results for input(s): COLORURINE, LABSPEC, PHURINE, GLUCOSEU, HGBUR, BILIRUBINUR, KETONESUR, PROTEINUR, UROBILINOGEN, NITRITE, LEUKOCYTESUR in the last 72 hours.  Invalid input(s): APPERANCEUR    Imaging: Dg Chest Portable 1 View  Result Date: 11/21/2018 CLINICAL DATA:  Cough and fever EXAM: PORTABLE CHEST 1 VIEW COMPARISON:  November 19, 2018 FINDINGS: There is no appreciable edema or consolidation. Heart is enlarged, stable, with pulmonary vascularity within normal limits. Stent is noted in the left axillary region. No adenopathy. There is aortic atherosclerosis. No bone lesions. IMPRESSION: Stable cardiomegaly without frank edema or consolidation. Aortic  Atherosclerosis (ICD10-I70.0). Electronically Signed   By: Lowella Grip III M.D.   On: 11/21/2018 15:45     Medications:   . sodium chloride    . sodium chloride    . sodium chloride     . amLODipine  10 mg Oral Daily  . Chlorhexidine Gluconate Cloth  6 each Topical Q0600  . docusate sodium  100 mg Oral BID  . guaiFENesin  600 mg Oral BID  . heparin  5,000 Units Subcutaneous Q8H  . metoprolol tartrate  25 mg Oral Daily  . multivitamins with iron  1 tablet Oral Daily  . [START ON 11/23/2018] oseltamivir  30 mg Oral Once per day on Tue Thu  . sevelamer carbonate  1,600 mg Oral TID WC  . sodium chloride flush  3 mL Intravenous Q12H  . sucroferric oxyhydroxide  500 mg Oral 6 X Daily   sodium chloride, sodium chloride, sodium chloride, acetaminophen **OR** acetaminophen, albuterol, alteplase, bisacodyl, gabapentin, guaiFENesin-dextromethorphan, heparin, heparin, hydrALAZINE, HYDROcodone-acetaminophen, ipratropium-albuterol, lidocaine (PF), lidocaine-prilocaine, ondansetron **OR** ondansetron (ZOFRAN) IV, pentafluoroprop-tetrafluoroeth, polyethylene glycol, sodium chloride flush, sodium phosphate  Assessment/ Plan:  58 y.o. male with ESRD on HD since 2012, HTN, h/o DM now diet controlled, h/o CHF, Cardiac ablation, h/o AVF stent and angioplasty,  Jeremy [name] [facility]/MWF/64.5 kg/195 min (3:15)  1.  End-stage renal disease. 2.  Anemia of chronic kidney disease. 3.  Hypertension. 4.  Secondary hyperparathyroidism.  Plan: Patient due for hemodialysis today.  Orders have been prepared.  After his next treatment will be on Wednesday as an outpatient.  No indication for Epogen at the moment.  Maintain the patient Renvela 1600 mg p.o. 3 times daily.  Otherwise disposition as per hospitalist.   LOS: 1 Jeremy [name] 3/16/202012:45 PM

## 2018-11-23 ENCOUNTER — Emergency Department: Payer: Medicare Other

## 2018-11-23 ENCOUNTER — Other Ambulatory Visit: Payer: Self-pay

## 2018-11-23 ENCOUNTER — Inpatient Hospital Stay
Admission: EM | Admit: 2018-11-23 | Discharge: 2018-11-24 | DRG: 070 | Disposition: A | Discharge status: Home / Self Care | Payer: Medicare Other | Attending: Internal Medicine | Admitting: Internal Medicine

## 2018-11-23 ENCOUNTER — Encounter: Payer: Self-pay | Admitting: Internal Medicine

## 2018-11-23 DIAGNOSIS — G9341 Metabolic encephalopathy: Secondary | ICD-10-CM | POA: Diagnosis present

## 2018-11-23 DIAGNOSIS — I132 Hypertensive heart and chronic kidney disease with heart failure and with stage 5 chronic kidney disease, or end stage renal disease: Secondary | ICD-10-CM | POA: Diagnosis present

## 2018-11-23 DIAGNOSIS — R4182 Altered mental status, unspecified: Secondary | ICD-10-CM

## 2018-11-23 DIAGNOSIS — Z8249 Family history of ischemic heart disease and other diseases of the circulatory system: Secondary | ICD-10-CM | POA: Diagnosis not present

## 2018-11-23 DIAGNOSIS — R778 Other specified abnormalities of plasma proteins: Secondary | ICD-10-CM

## 2018-11-23 DIAGNOSIS — D631 Anemia in chronic kidney disease: Secondary | ICD-10-CM | POA: Diagnosis present

## 2018-11-23 DIAGNOSIS — J111 Influenza due to unidentified influenza virus with other respiratory manifestations: Secondary | ICD-10-CM

## 2018-11-23 DIAGNOSIS — Z79899 Other long term (current) drug therapy: Secondary | ICD-10-CM

## 2018-11-23 DIAGNOSIS — Z5329 Procedure and treatment not carried out because of patient's decision for other reasons: Secondary | ICD-10-CM | POA: Diagnosis present

## 2018-11-23 DIAGNOSIS — F17210 Nicotine dependence, cigarettes, uncomplicated: Secondary | ICD-10-CM | POA: Diagnosis present

## 2018-11-23 DIAGNOSIS — Z992 Dependence on renal dialysis: Secondary | ICD-10-CM | POA: Diagnosis not present

## 2018-11-23 DIAGNOSIS — E1122 Type 2 diabetes mellitus with diabetic chronic kidney disease: Secondary | ICD-10-CM | POA: Diagnosis present

## 2018-11-23 DIAGNOSIS — Z9119 Patient's noncompliance with other medical treatment and regimen: Secondary | ICD-10-CM | POA: Diagnosis not present

## 2018-11-23 DIAGNOSIS — N186 End stage renal disease: Secondary | ICD-10-CM | POA: Diagnosis present

## 2018-11-23 DIAGNOSIS — I509 Heart failure, unspecified: Secondary | ICD-10-CM | POA: Diagnosis present

## 2018-11-23 DIAGNOSIS — J1081 Influenza due to other identified influenza virus with encephalopathy: Secondary | ICD-10-CM | POA: Diagnosis present

## 2018-11-23 DIAGNOSIS — Z95820 Peripheral vascular angioplasty status with implants and grafts: Secondary | ICD-10-CM

## 2018-11-23 DIAGNOSIS — E11649 Type 2 diabetes mellitus with hypoglycemia without coma: Secondary | ICD-10-CM | POA: Diagnosis present

## 2018-11-23 DIAGNOSIS — F329 Major depressive disorder, single episode, unspecified: Secondary | ICD-10-CM | POA: Diagnosis present

## 2018-11-23 DIAGNOSIS — E875 Hyperkalemia: Secondary | ICD-10-CM | POA: Diagnosis present

## 2018-11-23 DIAGNOSIS — N2581 Secondary hyperparathyroidism of renal origin: Secondary | ICD-10-CM | POA: Diagnosis present

## 2018-11-23 DIAGNOSIS — R7989 Other specified abnormal findings of blood chemistry: Secondary | ICD-10-CM

## 2018-11-23 DIAGNOSIS — G9340 Encephalopathy, unspecified: Secondary | ICD-10-CM | POA: Diagnosis present

## 2018-11-23 LAB — GLUCOSE, CAPILLARY
GLUCOSE-CAPILLARY: 78 mg/dL (ref 70–99)
Glucose-Capillary: 65 mg/dL — ABNORMAL LOW (ref 70–99)
Glucose-Capillary: 179 mg/dL — ABNORMAL HIGH (ref 70–99)
Glucose-Capillary: 238 mg/dL — ABNORMAL HIGH (ref 70–99)
Glucose-Capillary: 174 mg/dL — ABNORMAL HIGH (ref 70–99)
Glucose-Capillary: 66 mg/dL — ABNORMAL LOW (ref 70–99)
Glucose-Capillary: 61 mg/dL — ABNORMAL LOW (ref 70–99)

## 2018-11-23 LAB — CBC WITH DIFFERENTIAL/PLATELET
Abs Immature Granulocytes: 0.02 10*3/uL (ref 0.00–0.07)
Basophils Absolute: 0 10*3/uL (ref 0.0–0.1)
Basophils Relative: 1 %
Eosinophils Absolute: 0.1 10*3/uL (ref 0.0–0.5)
Eosinophils Relative: 2 %
HCT: 39.8 % (ref 39.0–52.0)
Hemoglobin: 12.3 g/dL — ABNORMAL LOW (ref 13.0–17.0)
IMMATURE GRANULOCYTES: 1 %
Lymphocytes Relative: 22 %
Lymphs Abs: 0.7 10*3/uL (ref 0.7–4.0)
MCH: 30.3 pg (ref 26.0–34.0)
MCHC: 30.9 g/dL (ref 30.0–36.0)
MCV: 98 fL (ref 80.0–100.0)
MONO ABS: 0.4 10*3/uL (ref 0.1–1.0)
Monocytes Relative: 10 %
NEUTROS ABS: 2.2 10*3/uL (ref 1.7–7.7)
Neutrophils Relative %: 64 %
Platelets: 183 10*3/uL (ref 150–400)
RBC: 4.06 MIL/uL — AB (ref 4.22–5.81)
RDW: 16.3 % — ABNORMAL HIGH (ref 11.5–15.5)
WBC: 3.4 10*3/uL — AB (ref 4.0–10.5)
nRBC: 0 % (ref 0.0–0.2)

## 2018-11-23 LAB — COMPREHENSIVE METABOLIC PANEL
ALT: 14 U/L (ref 0–44)
AST: 30 U/L (ref 15–41)
Albumin: 3.3 g/dL — ABNORMAL LOW (ref 3.5–5.0)
Alkaline Phosphatase: 92 U/L (ref 38–126)
Anion gap: 19 — ABNORMAL HIGH (ref 5–15)
BUN: 76 mg/dL — ABNORMAL HIGH (ref 6–20)
CO2: 19 mmol/L — ABNORMAL LOW (ref 22–32)
Calcium: 8.5 mg/dL — ABNORMAL LOW (ref 8.9–10.3)
Chloride: 102 mmol/L (ref 98–111)
Creatinine, Ser: 16.09 mg/dL — ABNORMAL HIGH (ref 0.61–1.24)
GFR calc Af Amer: 3 mL/min — ABNORMAL LOW (ref >60)
GFR calc non Af Amer: 3 mL/min — ABNORMAL LOW (ref >60)
Glucose, Bld: 68 mg/dL — ABNORMAL LOW (ref 70–99)
Potassium: 5.2 mmol/L — ABNORMAL HIGH (ref 3.5–5.1)
Sodium: 140 mmol/L (ref 135–145)
Total Bilirubin: 1.1 mg/dL (ref 0.3–1.2)
Total Protein: 7.4 g/dL (ref 6.5–8.1)

## 2018-11-23 LAB — LACTIC ACID, PLASMA
Lactic Acid, Venous: 1.1 mmol/L (ref 0.5–1.9)
Lactic Acid, Venous: 0.9 mmol/L (ref 0.5–1.9)

## 2018-11-23 LAB — BLOOD GAS, VENOUS
Acid-base deficit: 5 mmol/L — ABNORMAL HIGH (ref 0.0–2.0)
Bicarbonate: 21.1 mmol/L (ref 20.0–28.0)
O2 Saturation: 58.7 %
Patient temperature: 37
pCO2, Ven: 42 mmHg — ABNORMAL LOW (ref 44.0–60.0)
pH, Ven: 7.31 (ref 7.250–7.430)
pO2, Ven: 34 mmHg (ref 32.0–45.0)

## 2018-11-23 LAB — TROPONIN I: Troponin I: 0.11 ng/mL (ref <0.03)

## 2018-11-23 LAB — BRAIN NATRIURETIC PEPTIDE: B Natriuretic Peptide: 414 pg/mL — ABNORMAL HIGH (ref 0.0–100.0)

## 2018-11-23 MED ORDER — METHYLPREDNISOLONE SODIUM SUCC 125 MG IJ SOLR
60.0000 mg | Freq: Once | INTRAMUSCULAR | Status: AC
  Administered 2018-11-24: 60 mg via INTRAVENOUS
  Filled 2018-11-23: qty 2

## 2018-11-23 MED ORDER — SODIUM CHLORIDE 0.9 % IV SOLN
1.0000 g | Freq: Once | INTRAVENOUS | Status: AC
  Administered 2018-11-23: 1 g via INTRAVENOUS
  Filled 2018-11-23 (×2): qty 10

## 2018-11-23 MED ORDER — ONDANSETRON HCL 4 MG PO TABS: 4.0000 mg | ORAL_TABLET | Freq: Four times a day (QID) | ORAL | Status: DC | PRN

## 2018-11-23 MED ORDER — ONDANSETRON HCL 4 MG/2ML IJ SOLN: 4.0000 mg | Freq: Four times a day (QID) | INTRAMUSCULAR | Status: DC | PRN

## 2018-11-23 MED ORDER — LORAZEPAM 2 MG/ML IJ SOLN
1.0000 mg | Freq: Once | INTRAMUSCULAR | Status: AC
  Administered 2018-11-23: 1 mg via INTRAVENOUS
  Filled 2018-11-23: qty 1

## 2018-11-23 MED ORDER — DEXTROSE 10 % IV SOLN
INTRAVENOUS | Status: DC
  Administered 2018-11-23: 16:00:00 via INTRAVENOUS

## 2018-11-23 MED ORDER — ASPIRIN 81 MG PO CHEW
324.0000 mg | CHEWABLE_TABLET | Freq: Once | ORAL | Status: AC
  Administered 2018-11-23: 324 mg via ORAL
  Filled 2018-11-23: qty 4

## 2018-11-23 MED ORDER — IPRATROPIUM-ALBUTEROL 0.5-2.5 (3) MG/3ML IN SOLN
3.0000 mL | RESPIRATORY_TRACT | Status: DC
  Administered 2018-11-24 (×2): 3 mL via RESPIRATORY_TRACT
  Filled 2018-11-23 (×3): qty 3

## 2018-11-23 MED ORDER — GUAIFENESIN-DM 100-10 MG/5ML PO SYRP
5.0000 mL | ORAL_SOLUTION | ORAL | Status: DC | PRN
  Administered 2018-11-23: 5 mL via ORAL
  Filled 2018-11-23: qty 5

## 2018-11-23 MED ORDER — CHLORHEXIDINE GLUCONATE CLOTH 2 % EX PADS
6.0000 | MEDICATED_PAD | Freq: Every day | CUTANEOUS | Status: DC
  Filled 2018-11-23: qty 6

## 2018-11-23 MED ORDER — SODIUM CHLORIDE 0.9% FLUSH
3.0000 mL | Freq: Two times a day (BID) | INTRAVENOUS | Status: DC
  Administered 2018-11-24: 3 mL via INTRAVENOUS

## 2018-11-23 MED ORDER — ALBUTEROL SULFATE HFA 108 (90 BASE) MCG/ACT IN AERS
2.0000 | INHALATION_SPRAY | Freq: Once | RESPIRATORY_TRACT | Status: DC
  Filled 2018-11-23: qty 6.7

## 2018-11-23 MED ORDER — PREDNISONE 20 MG PO TABS
40.0000 mg | ORAL_TABLET | Freq: Every day | ORAL | Status: DC
  Administered 2018-11-24: 40 mg via ORAL
  Filled 2018-11-23: qty 2

## 2018-11-23 MED ORDER — LORAZEPAM BOLUS VIA INFUSION: 1.0000 mg | Freq: Once | INTRAVENOUS | Status: DC

## 2018-11-23 MED ORDER — POLYETHYLENE GLYCOL 3350 17 G PO PACK: 17.0000 g | PACK | Freq: Every day | ORAL | Status: DC | PRN

## 2018-11-23 MED ORDER — OSELTAMIVIR PHOSPHATE 30 MG PO CAPS
30.0000 mg | ORAL_CAPSULE | Freq: Once | ORAL | Status: AC
  Administered 2018-11-23: 30 mg via ORAL
  Filled 2018-11-23 (×2): qty 1

## 2018-11-23 MED ORDER — ACETAMINOPHEN 650 MG RE SUPP: 650.0000 mg | Freq: Four times a day (QID) | RECTAL | Status: DC | PRN

## 2018-11-23 MED ORDER — ALBUTEROL SULFATE (2.5 MG/3ML) 0.083% IN NEBU: 2.5000 mg | INHALATION_SOLUTION | RESPIRATORY_TRACT | Status: DC | PRN

## 2018-11-23 MED ORDER — LEVOFLOXACIN IN D5W 750 MG/150ML IV SOLN
750.0000 mg | Freq: Once | INTRAVENOUS | Status: AC
  Administered 2018-11-23: 750 mg via INTRAVENOUS
  Filled 2018-11-23: qty 150

## 2018-11-23 MED ORDER — HEPARIN SODIUM (PORCINE) 5000 UNIT/ML IJ SOLN: 5000.0000 [IU] | Freq: Two times a day (BID) | INTRAMUSCULAR | Status: DC

## 2018-11-23 MED ORDER — ACETAMINOPHEN 325 MG PO TABS: 650.0000 mg | ORAL_TABLET | Freq: Four times a day (QID) | ORAL | Status: DC | PRN

## 2018-11-23 MED ORDER — DEXTROSE 50 % IV SOLN
50.0000 mL | Freq: Once | INTRAVENOUS | Status: AC
  Administered 2018-11-23: 50 mL via INTRAVENOUS
  Filled 2018-11-23: qty 50

## 2018-11-23 MED ORDER — SEVELAMER CARBONATE 800 MG PO TABS
1600.0000 mg | ORAL_TABLET | Freq: Three times a day (TID) | ORAL | Status: DC
  Administered 2018-11-24 (×2): 1600 mg via ORAL
  Filled 2018-11-23 (×2): qty 2

## 2018-11-23 MED ORDER — SODIUM BICARBONATE 8.4 % IV SOLN
50.0000 meq | Freq: Once | INTRAVENOUS | Status: AC
  Administered 2018-11-23: 50 meq via INTRAVENOUS
  Filled 2018-11-23: qty 50

## 2018-11-23 MED ORDER — OSELTAMIVIR PHOSPHATE 30 MG PO CAPS: 30.0000 mg | ORAL_CAPSULE | ORAL | Status: DC

## 2018-11-23 MED ORDER — ENOXAPARIN SODIUM 40 MG/0.4ML ~~LOC~~ SOLN: 40.0000 mg | SUBCUTANEOUS | Status: DC

## 2018-11-23 MED ORDER — METOPROLOL TARTRATE 25 MG PO TABS
25.0000 mg | ORAL_TABLET | Freq: Every day | ORAL | Status: DC
  Administered 2018-11-24: 25 mg via ORAL
  Filled 2018-11-23: qty 1

## 2018-11-23 MED ORDER — SUCROFERRIC OXYHYDROXIDE 500 MG PO CHEW
500.0000 mg | CHEWABLE_TABLET | Freq: Every day | ORAL | Status: DC
  Administered 2018-11-24: 500 mg via ORAL
  Filled 2018-11-23 (×9): qty 1

## 2018-11-23 NOTE — Progress Notes (Signed)
Pt is somewhat cooperative, yet very mobile, moving constantly. Recannulation is successful and TX resumed. MD notified.

## 2018-11-23 NOTE — ED Notes (Signed)
Spoke with pt's wife. Wife has concerns that patient will leave hospital AMA, as he has already done this week. States that she wants to attempt to IVC pt in order to ensure he stays in hospital for course of treatment. States that she will obtain and bring IVC paperwork before 3pm today.

## 2018-11-23 NOTE — ED Notes (Signed)
Inpatient RN not ready to take report. Will call back in 15 mins to send patient.

## 2018-11-23 NOTE — Progress Notes (Signed)
Pre HD Tx    11/23/18 1823  Vital Signs  Temp 97.7 F (36.5 C)  Temp Source Oral  Pulse Rate 64  Pulse Rate Source Dinamap  Resp 18  BP (!) 118/59  BP Location Right Arm  BP Method Automatic  Patient Position (if appropriate) Lying  Oxygen Therapy  SpO2 100 %  O2 Device Room Air  Pain Assessment  Pain Scale 0-10  Pain Score 0  Dialysis Weight  Weight 62.5 kg  Type of Weight Pre-Dialysis  Time-Out for Hemodialysis  What Procedure? HD  Pt Identifiers(min of two) First/Last Name;MRN/Account#  Correct Site? Yes  Correct Side? Yes  Correct Procedure? Yes  Consents Verified? Yes  Rad Studies Available? N/A  Safety Precautions Reviewed? Yes  Engineer, civil (consulting) Number  (1A)  Station Number  (Bedside Room 210)  UF/Alarm Test Passed  Conductivity: Meter 13.6  Conductivity: Machine  13.5  pH 7.4  Reverse Osmosis Portable RO (#1)  Normal Saline Lot Number [id number]  Dialyzer Lot Number [id number]  Disposable Set Lot Number [id number]  Machine Temperature 98.6 F (37 C)  Musician and Audible Yes  Blood Lines Intact and Secured Yes  Pre Treatment Patient Checks  Vascular access used during treatment Fistula  Hepatitis B Surface Antigen Results Negative  Date Hepatitis B Surface Antigen Drawn 05/18/18  Isolation Initiated Yes  Hepatitis B Surface Antibody  (>10)  Date Hepatitis B Surface Antibody Drawn 05/18/18  Hemodialysis Consent Verified Yes  Hemodialysis Standing Orders Initiated Yes  ECG (Telemetry) Monitor On Yes  Prime Ordered Normal Saline  Length of  DialysisTreatment -hour(s) 3.5 Hour(s)  Dialysis Treatment Comments Na 140  Dialyzer Elisio 17H NR  Dialysate 2K, 2.5 Ca  Dialysis Anticoagulant None  Dialysate Flow Ordered 800  Blood Flow Rate Ordered 400 mL/min  Ultrafiltration Goal 1.5 Liters  Dialysis Blood Pressure Support Ordered Normal Saline  Education / Care Plan  Dialysis Education Provided No (Comment)  Documented Education in Care  Plan  (Pt is confused, no family present )  Fistula / Graft Left Upper arm  Placement Date: 05/17/18   Placed prior to admission: Yes  Orientation: Left  Access Location: Upper arm  Site Condition No complications  Fistula / Graft Assessment Present;Thrill  Status Patent

## 2018-11-23 NOTE — Progress Notes (Signed)
Pt will received Rx for agitation and then HD tx will resume, additional set up required.

## 2018-11-23 NOTE — ED Notes (Signed)
Patient removed PIV in RA. Bleeding occluded with guaze and controlled. Pt refuses to change blood soiled gown at this time. VSS, bed low, locked in lowest positon. Warm blanket given.

## 2018-11-23 NOTE — Progress Notes (Signed)
Pharmacy Note  Pt ordered enoxaparin for DVT ppx. HD pt. Changed to SQ heparin q12h per protocol.

## 2018-11-23 NOTE — ED Notes (Signed)
ED TO INPATIENT HANDOFF REPORT  ED Nurse Name and Phone #: [phone] Name/Age/Gender Jeremy [name] 58 y.o. male Room/Bed: ED05A/ED05A  Code Status   Code Status: Prior  Home/SNF/Other Home Patient oriented to: self Is this baseline? No   Triage Complete: Triage complete  Chief Complaint weakness  Triage Note Patient arrived via [facility] from home for altered mental status per wife. Pt diagnosed with flu on Friday. Has not received dialysis since 3/13. BG 58 en route, given glucagon in en route.    Allergies No Known Allergies  Level of Care/Admitting Diagnosis ED Disposition    ED Disposition Condition Comment   Admit  The patient appears reasonably stabilized for admission considering the current resources, flow, and capabilities available in the ED at this time, and I doubt any other [facility] requiring further screening and/or treatment in the ED prior to admission is  present.       B Medical/Surgery History Past Medical History:  Diagnosis Date  . Bronchitis   . Hypertension   . Renal disorder    kidney failure, dialysis   Past Surgical History:  Procedure Laterality Date  . DIALYSIS FISTULA CREATION       A IV Location/Drains/Wounds Patient Lines/Drains/Airways Status   Active Line/Drains/Airways    Name:   Placement date:   Placement time:   Site:   Days:   Peripheral IV 11/23/18 Right Hand   11/23/18    1052    Hand   less than 1   Peripheral IV 11/23/18 Right Forearm   11/23/18    1120    Forearm   less than 1   Fistula / Graft Left Upper arm   05/17/18    -    Upper arm   190          Intake/Output Last 24 hours No intake or output data in the 24 hours ending 11/23/18 1148  Labs/Imaging Results for orders placed or performed during the hospital encounter of 11/23/18 (from the past 48 hour(s))  Glucose, capillary     Status: Abnormal   Collection Time: 11/23/18 10:07 AM  Result Value Ref Range   Glucose-Capillary 65 (L) 70 - 99 mg/dL  Blood  gas, venous ([facility], [facility], [facility])     Status: Abnormal   Collection Time: 11/23/18 10:10 AM  Result Value Ref Range   pH, Ven 7.31 7.250 - 7.430   pCO2, Ven 42 (L) 44.0 - 60.0 mmHg   pO2, Ven 34.0 32.0 - 45.0 mmHg   Bicarbonate 21.1 20.0 - 28.0 mmol/L   Acid-base deficit 5.0 (H) 0.0 - 2.0 mmol/L   O2 Saturation 58.7 %   Patient temperature 37.0    Collection site VEIN    Sample type VENOUS     Comment: Performed at [facility], [address] [place], [place] [address]  Lactic acid, plasma     Status: None   Collection Time: 11/23/18 10:21 AM  Result Value Ref Range   Lactic Acid, Venous 1.1 0.5 - 1.9 mmol/L    Comment: Performed at [facility], [address] [place], [place] [address]  Comprehensive metabolic panel     Status: Abnormal   Collection Time: 11/23/18 10:21 AM  Result Value Ref Range   Sodium 140 135 - 145 mmol/L   Potassium 5.2 (H) 3.5 - 5.1 mmol/L   Chloride 102 98 - 111 mmol/L   CO2 19 (L) 22 - 32 mmol/L   Glucose, Bld 68 (L)  70 - 99 mg/dL   BUN 76 (H) 6 - 20 mg/dL   Creatinine, Ser 16.09 (H) 0.61 - 1.24 mg/dL   Calcium 8.5 (L) 8.9 - 10.3 mg/dL   Total Protein 7.4 6.5 - 8.1 g/dL   Albumin 3.3 (L) 3.5 - 5.0 g/dL   AST 30 15 - 41 U/L   ALT 14 0 - 44 U/L   Alkaline Phosphatase 92 38 - 126 U/L   Total Bilirubin 1.1 0.3 - 1.2 mg/dL   GFR calc non Af Amer 3 (L) >60 mL/min   GFR calc Af Amer 3 (L) >60 mL/min   Anion gap 19 (H) 5 - 15    Comment: Performed at [facility], [address] [place], [place] [address]  CBC WITH DIFFERENTIAL     Status: Abnormal   Collection Time: 11/23/18 10:21 AM  Result Value Ref Range   WBC 3.4 (L) 4.0 - 10.5 K/uL   RBC 4.06 (L) 4.22 - 5.81 MIL/uL   Hemoglobin 12.3 (L) 13.0 - 17.0 g/dL   HCT 39.8 39.0 - 52.0 %   MCV 98.0 80.0 - 100.0 fL   MCH 30.3 26.0 - 34.0 pg   MCHC 30.9 30.0 - 36.0 g/dL   RDW 16.3 (H) 11.5 - 15.5 %   Platelets 183 150 - 400 K/uL   nRBC 0.0 0.0 - 0.2 %   Neutrophils Relative %  64 %   Neutro Abs 2.2 1.7 - 7.7 K/uL   Lymphocytes Relative 22 %   Lymphs Abs 0.7 0.7 - 4.0 K/uL   Monocytes Relative 10 %   Monocytes Absolute 0.4 0.1 - 1.0 K/uL   Eosinophils Relative 2 %   Eosinophils Absolute 0.1 0.0 - 0.5 K/uL   Basophils Relative 1 %   Basophils Absolute 0.0 0.0 - 0.1 K/uL   WBC Morphology MORPHOLOGY UNREMARKABLE    Smear Review MORPHOLOGY UNREMARKABLE    Immature Granulocytes 1 %   Abs Immature Granulocytes 0.02 0.00 - 0.07 K/uL   Burr Cells PRESENT     Comment: Performed at [facility], [address] [place], [place] [address]  Brain natriuretic peptide     Status: Abnormal   Collection Time: 11/23/18 10:21 AM  Result Value Ref Range   B Natriuretic Peptide 414.0 (H) 0.0 - 100.0 pg/mL    Comment: Performed at [facility], [address] [place], [place] [address]  Troponin I - ONCE - STAT     Status: Abnormal   Collection Time: 11/23/18 10:21 AM  Result Value Ref Range   Troponin I 0.11 (HH) <0.03 ng/mL    Comment: CRITICAL VALUE NOTED. VALUE IS CONSISTENT WITH PREVIOUSLY REPORTED/CALLED VALUE RDW/KLW Performed at [facility], [address] [place], [place] [address]   Glucose, capillary     Status: Abnormal   Collection Time: 11/23/18 10:39 AM  Result Value Ref Range   Glucose-Capillary 238 (H) 70 - 99 mg/dL   Dg Chest Port 1 View  Result Date: 11/23/2018 CLINICAL DATA:  Altered mental status.  Recent diagnosis of flu EXAM: PORTABLE CHEST 1 VIEW COMPARISON:  Two days ago FINDINGS: Cardiomegaly. Chronic generalized interstitial coarsening without Kerley line. No acute airspace opacity. Venous stenting and osteopenia. IMPRESSION: No acute finding when compared to priors. Electronically Signed   By: Monte Fantasia M.D.   On: 11/23/2018 10:31   Dg Chest Portable 1 View  Result Date: 11/21/2018 CLINICAL DATA:  Cough and fever EXAM: PORTABLE CHEST 1 VIEW COMPARISON:  November 19, 2018 FINDINGS: There is no appreciable  edema or consolidation. Heart is enlarged, stable, with pulmonary vascularity within normal limits. Stent is noted in the left axillary region. No adenopathy. There is aortic atherosclerosis. No bone lesions. IMPRESSION: Stable cardiomegaly without frank edema or consolidation. Aortic Atherosclerosis (ICD10-I70.0). Electronically Signed   By: Lowella Grip III M.D.   On: 11/21/2018 15:45    Pending Labs Unresulted Labs (From admission, onward)    Start     Ordered   11/23/18 1009  Lactic acid, plasma  Now then every 2 hours,   STAT     11/23/18 1010   11/23/18 1009  Blood Culture (routine x 2)  BLOOD CULTURE X 2,   STAT     11/23/18 1010          Vitals/Pain Today's Vitals   11/23/18 1000 11/23/18 1006 11/23/18 1007 11/23/18 1121  BP: (!) 115/50  (!) 111/58 (!) 107/52  Pulse: 64 64 64 66  Resp:  20 (!) 26 19  Temp:  98.2 F (36.8 C)    TempSrc:  Oral    SpO2: 95% 95% 95% 100%    Isolation Precautions No active isolations  Medications Medications  levofloxacin (LEVAQUIN) IVPB 750 mg (750 mg Intravenous New Bag/Given 11/23/18 1039)  calcium gluconate 1 g in sodium chloride 0.9 % 100 mL IVPB (1 g Intravenous New Bag/Given 11/23/18 1128)  albuterol (PROVENTIL HFA;VENTOLIN HFA) 108 (90 Base) MCG/ACT inhaler 2 puff (has no administration in time range)  aspirin chewable tablet 324 mg (has no administration in time range)  dextrose 50 % solution 50 mL (50 mLs Intravenous Given 11/23/18 1035)  oseltamivir (TAMIFLU) capsule 30 mg (30 mg Oral Given 11/23/18 1122)  sodium bicarbonate injection 50 mEq (50 mEq Intravenous Given 11/23/18 1053)    Mobility walks High fall risk   Focused Assessments Cardiac Assessment Handoff:  Cardiac Rhythm: Normal sinus rhythm Lab Results  Component Value Date   TROPONINI 0.11 ([place]) 11/23/2018   No results found for: DDIMER Does the Patient currently have chest pain? No     R Recommendations: See Admitting Provider Note  Report given to:    Additional Notes: Patient is oriented to self, does not follow commands well. Patient has moments of agitation and can become combative.

## 2018-11-23 NOTE — ED Notes (Signed)
ED TO INPATIENT HANDOFF REPORT  ED Nurse Name and Phone #: Eugene Gavia  S Name/Age/Gender Jeremy [name] 58 y.o. male Room/Bed: ED05A/ED05A  Code Status   Code Status: Full Code  Home/SNF/Other Home Patient oriented to: self Is this baseline? No   Triage Complete: Triage complete  Chief Complaint weakness  Triage Note Patient arrived via [facility] from home for altered mental status per wife. Pt diagnosed with flu on Friday. Has not received dialysis since 3/13. BG 58 en route, given glucagon in en route.    Allergies No Known Allergies  Level of Care/Admitting Diagnosis ED Disposition    ED Disposition Condition [facility]: [place] [100120]  Level of Care: Med-Surg [16]  Diagnosis: Acute encephalopathy [[id number]]  Admitting Physician: Hillary Bow [[id number]]  Attending Physician: Hillary Bow [[id number]]  Estimated length of stay: past midnight tomorrow  Certification:: I certify this patient will need inpatient services for at least 2 midnights  PT Class (Do Not Modify): Inpatient [101]  PT Acc Code (Do Not Modify): Private [1]       B Medical/Surgery History Past Medical History:  Diagnosis Date  . Bronchitis   . Hypertension   . Renal disorder    kidney failure, dialysis   Past Surgical History:  Procedure Laterality Date  . DIALYSIS FISTULA CREATION       A IV Location/Drains/Wounds Patient Lines/Drains/Airways Status   Active Line/Drains/Airways    Name:   Placement date:   Placement time:   Site:   Days:   Peripheral IV 11/23/18 Right Hand   11/23/18    1052    Hand   less than 1   Peripheral IV 11/23/18 Right Forearm   11/23/18    1120    Forearm   less than 1   Fistula / Graft Left Upper arm   05/17/18    -    Upper arm   190          Intake/Output Last 24 hours  Intake/Output Summary (Last 24 hours) at 11/23/2018 1425 Last data filed at 11/23/2018 1228 Gross per 24 hour  Intake 242.56 ml  Output -   Net 242.56 ml    Labs/Imaging Results for orders placed or performed during the hospital encounter of 11/23/18 (from the past 48 hour(s))  Glucose, capillary     Status: Abnormal   Collection Time: 11/23/18 10:07 AM  Result Value Ref Range   Glucose-Capillary 65 (L) 70 - 99 mg/dL  Blood gas, venous ([facility], [facility], [facility])     Status: Abnormal   Collection Time: 11/23/18 10:10 AM  Result Value Ref Range   pH, Ven 7.31 7.250 - 7.430   pCO2, Ven 42 (L) 44.0 - 60.0 mmHg   pO2, Ven 34.0 32.0 - 45.0 mmHg   Bicarbonate 21.1 20.0 - 28.0 mmol/L   Acid-base deficit 5.0 (H) 0.0 - 2.0 mmol/L   O2 Saturation 58.7 %   Patient temperature 37.0    Collection site VEIN    Sample type VENOUS     Comment: Performed at [facility], [address] [place], [place] [address]  Lactic acid, plasma     Status: None   Collection Time: 11/23/18 10:21 AM  Result Value Ref Range   Lactic Acid, Venous 1.1 0.5 - 1.9 mmol/L    Comment: Performed at [facility], [address] [place], [place] [address]  Comprehensive metabolic panel     Status: Abnormal  Collection Time: 11/23/18 10:21 AM  Result Value Ref Range   Sodium 140 135 - 145 mmol/L   Potassium 5.2 (H) 3.5 - 5.1 mmol/L   Chloride 102 98 - 111 mmol/L   CO2 19 (L) 22 - 32 mmol/L   Glucose, Bld 68 (L) 70 - 99 mg/dL   BUN 76 (H) 6 - 20 mg/dL   Creatinine, Ser 16.09 (H) 0.61 - 1.24 mg/dL   Calcium 8.5 (L) 8.9 - 10.3 mg/dL   Total Protein 7.4 6.5 - 8.1 g/dL   Albumin 3.3 (L) 3.5 - 5.0 g/dL   AST 30 15 - 41 U/L   ALT 14 0 - 44 U/L   Alkaline Phosphatase 92 38 - 126 U/L   Total Bilirubin 1.1 0.3 - 1.2 mg/dL   GFR calc non Af Amer 3 (L) >60 mL/min   GFR calc Af Amer 3 (L) >60 mL/min   Anion gap 19 (H) 5 - 15    Comment: Performed at [facility], [address] [place], [place] [address]  CBC WITH DIFFERENTIAL     Status: Abnormal   Collection Time: 11/23/18 10:21 AM  Result Value Ref Range   WBC 3.4 (L) 4.0 - 10.5  K/uL   RBC 4.06 (L) 4.22 - 5.81 MIL/uL   Hemoglobin 12.3 (L) 13.0 - 17.0 g/dL   HCT 39.8 39.0 - 52.0 %   MCV 98.0 80.0 - 100.0 fL   MCH 30.3 26.0 - 34.0 pg   MCHC 30.9 30.0 - 36.0 g/dL   RDW 16.3 (H) 11.5 - 15.5 %   Platelets 183 150 - 400 K/uL   nRBC 0.0 0.0 - 0.2 %   Neutrophils Relative % 64 %   Neutro Abs 2.2 1.7 - 7.7 K/uL   Lymphocytes Relative 22 %   Lymphs Abs 0.7 0.7 - 4.0 K/uL   Monocytes Relative 10 %   Monocytes Absolute 0.4 0.1 - 1.0 K/uL   Eosinophils Relative 2 %   Eosinophils Absolute 0.1 0.0 - 0.5 K/uL   Basophils Relative 1 %   Basophils Absolute 0.0 0.0 - 0.1 K/uL   WBC Morphology MORPHOLOGY UNREMARKABLE    Smear Review MORPHOLOGY UNREMARKABLE    Immature Granulocytes 1 %   Abs Immature Granulocytes 0.02 0.00 - 0.07 K/uL   Burr Cells PRESENT     Comment: Performed at [facility], [address] [place], [place] [address]  Brain natriuretic peptide     Status: Abnormal   Collection Time: 11/23/18 10:21 AM  Result Value Ref Range   B Natriuretic Peptide 414.0 (H) 0.0 - 100.0 pg/mL    Comment: Performed at [facility], [address] [place], [place] [address]  Troponin I - ONCE - STAT     Status: Abnormal   Collection Time: 11/23/18 10:21 AM  Result Value Ref Range   Troponin I 0.11 (HH) <0.03 ng/mL    Comment: CRITICAL VALUE NOTED. VALUE IS CONSISTENT WITH PREVIOUSLY REPORTED/CALLED VALUE RDW/KLW Performed at [facility], [address] [place], [place] [address]   Glucose, capillary     Status: Abnormal   Collection Time: 11/23/18 10:39 AM  Result Value Ref Range   Glucose-Capillary 238 (H) 70 - 99 mg/dL  Glucose, capillary     Status: Abnormal   Collection Time: 11/23/18 11:19 AM  Result Value Ref Range   Glucose-Capillary 179 (H) 70 - 99 mg/dL  Glucose, capillary     Status: Abnormal   Collection Time: 11/23/18 11:58  AM  Result Value Ref Range   Glucose-Capillary 174 (H) 70 - 99 mg/dL  Lactic acid, plasma      Status: None   Collection Time: 11/23/18 12:09 PM  Result Value Ref Range   Lactic Acid, Venous 0.9 0.5 - 1.9 mmol/L    Comment: Performed at [facility], [address], [place],  [address]   Ct Head Wo Contrast  Result Date: 11/23/2018 CLINICAL DATA:  Altered mental status.  Renal failure EXAM: CT HEAD WITHOUT CONTRAST TECHNIQUE: Contiguous axial images were obtained from the base of the skull through the vertex without intravenous contrast. COMPARISON:  None. FINDINGS: Brain: The ventricles and sulci are upper normal in size for age. There is no intracranial mass, hemorrhage, extra-axial fluid collection, or midline shift. There is slight small vessel disease in the centra semiovale bilaterally. There is evidence of a prior tiny lacunar infarct in the head of the caudate nucleus on the left. No acute appearing infarct is evident. Vascular: There is no appreciable hyperdense vessel. There is calcification in each carotid siphon and distal vertebral artery region. Skull: There are changes of osteomalacia in the calvarium. No lytic or destructive lesions. Sinuses/Orbits: There is mucosal thickening in several ethmoid air cells. Other visualized paranasal sinuses are clear. Orbits appear symmetric bilaterally. Other: Mastoid air cells are clear. IMPRESSION: Slight periventricular small vessel disease. 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 No acute infarct evident. No mass or hemorrhage. Foci of arterial vascular calcification noted. Mucosal thickening noted in several ethmoid air cells. Electronically Signed   By: Lowella Grip III M.D.   On: 11/23/2018 12:20   Dg Chest Port 1 View  Result Date: 11/23/2018 CLINICAL DATA:  Altered mental status.  Recent diagnosis of flu EXAM: PORTABLE CHEST 1 VIEW COMPARISON:  Two days ago FINDINGS: Cardiomegaly. Chronic generalized interstitial coarsening without Kerley line. No acute airspace opacity. Venous stenting and  osteopenia. IMPRESSION: No acute finding when compared to priors. Electronically Signed   By: Monte Fantasia M.D.   On: 11/23/2018 10:31   Dg Chest Portable 1 View  Result Date: 11/21/2018 CLINICAL DATA:  Cough and fever EXAM: PORTABLE CHEST 1 VIEW COMPARISON:  November 19, 2018 FINDINGS: There is no appreciable edema or consolidation. Heart is enlarged, stable, with pulmonary vascularity within normal limits. Stent is noted in the left axillary region. No adenopathy. There is aortic atherosclerosis. No bone lesions. IMPRESSION: Stable cardiomegaly without frank edema or consolidation. Aortic Atherosclerosis (ICD10-I70.0). Electronically Signed   By: Lowella Grip III M.D.   On: 11/21/2018 15:45    Pending Labs Unresulted Labs (From admission, onward)    Start     Ordered   11/24/18 7169  Basic metabolic panel  Tomorrow morning,   STAT     11/23/18 1231   11/24/18 0500  CBC  Tomorrow morning,   STAT     11/23/18 1231   11/23/18 1009  Blood Culture (routine x 2)  BLOOD CULTURE X 2,   STAT     11/23/18 1010          Vitals/Pain Today's Vitals   11/23/18 1230 11/23/18 1300 11/23/18 1310 11/23/18 1408  BP: (!) 128/59 128/61 128/61 (!) 128/59  Pulse: 63 60 66 67  Resp: 17  16 18   Temp:    97.7 F (36.5 C)  TempSrc:    Oral  SpO2: 98% 97% 100% 100%    Isolation Precautions No active isolations  Medications Medications  albuterol (  PROVENTIL HFA;VENTOLIN HFA) 108 (90 Base) MCG/ACT inhaler 2 puff (0 puffs Inhalation Refused 11/23/18 1303)  Chlorhexidine Gluconate Cloth 2 % PADS 6 each (has no administration in time range)  sodium chloride flush (NS) 0.9 % injection 3 mL (has no administration in time range)  acetaminophen (TYLENOL) tablet 650 mg (has no administration in time range)    Or  acetaminophen (TYLENOL) suppository 650 mg (has no administration in time range)  polyethylene glycol (MIRALAX / GLYCOLAX) packet 17 g (has no administration in time range)  ondansetron  (ZOFRAN) tablet 4 mg (has no administration in time range)    Or  ondansetron (ZOFRAN) injection 4 mg (has no administration in time range)  albuterol (PROVENTIL) (2.5 MG/3ML) 0.083% nebulizer solution 2.5 mg (has no administration in time range)  heparin injection 5,000 Units (has no administration in time range)  dextrose 50 % solution 50 mL (50 mLs Intravenous Given 11/23/18 1035)  levofloxacin (LEVAQUIN) IVPB 750 mg (0 mg Intravenous Stopped 11/23/18 1209)  oseltamivir (TAMIFLU) capsule 30 mg (30 mg Oral Given 11/23/18 1122)  calcium gluconate 1 g in sodium chloride 0.9 % 100 mL IVPB (0 g Intravenous Stopped 11/23/18 1228)  sodium bicarbonate injection 50 mEq (50 mEq Intravenous Given 11/23/18 1053)  aspirin chewable tablet 324 mg (324 mg Oral Given 11/23/18 1203)    Mobility walks High fall risk   Focused Assessments Cardiac Assessment Handoff:  Cardiac Rhythm: Normal sinus rhythm Lab Results  Component Value Date   TROPONINI 0.11 (Flas[place]) 11/23/2018   No results found for: DDIMER Does the Patient currently have chest pain? No     R Recommendations: See Admitting Provider Note  Report given to:   Additional Notes: NA

## 2018-11-23 NOTE — Progress Notes (Signed)
HD Tx completed    11/23/18 2200  Vital Signs  Pulse Rate 83  Pulse Rate Source Dinamap  Resp 18  BP 124/62  BP Location Right Arm  BP Method Automatic  Patient Position (if appropriate) Lying  Oxygen Therapy  SpO2 99 %  O2 Device Room Air  During Hemodialysis Assessment  HD Safety Checks Performed Yes  KECN 68.3 KECN  Dialysis Fluid Bolus Normal Saline  Bolus Amount (mL) 250 mL  Intra-Hemodialysis Comments Tx completed;Tolerated well

## 2018-11-23 NOTE — Progress Notes (Addendum)
Pt sugar is 65. MD order D10 going at 63ml/hr. Pt is not symptomatic. MD made aware. Will recheck BG later again.

## 2018-11-23 NOTE — ED Notes (Signed)
Patient refusing to use inhaler at this time.

## 2018-11-23 NOTE — Progress Notes (Signed)
CODE SEPSIS - PHARMACY COMMUNICATION  **Broad Spectrum Antibiotics should be administered within 1 hour of Sepsis diagnosis**  Time Code Sepsis Called/Page Received: 1016  Antibiotics Ordered: Levofloxacin   Time of 1st antibiotic administration: 1039 Levofloxacin   Additional action taken by pharmacy: N/A  If necessary, Name of Provider/Nurse Contacted: [place] Pharmacist  11/23/2018  12:14 PM

## 2018-11-23 NOTE — Progress Notes (Signed)
HD Tx inititated   11/23/18 1830  Vital Signs  Pulse Rate 63  Pulse Rate Source Dinamap  Resp 18  BP 118/61  BP Location Right Arm  BP Method Automatic  Patient Position (if appropriate) Lying  Oxygen Therapy  SpO2 99 %  O2 Device Room Air  During Hemodialysis Assessment  Blood Flow Rate (mL/min) 400 mL/min  Arterial Pressure (mmHg) -110 mmHg  Venous Pressure (mmHg) 250 mmHg  Transmembrane Pressure (mmHg) 60 mmHg  Ultrafiltration Rate (mL/min) 500 mL/min  Dialysate Flow Rate (mL/min) 800 ml/min  Conductivity: Machine  13.8  HD Safety Checks Performed Yes  Dialysis Fluid Bolus Normal Saline  Bolus Amount (mL) 250 mL  Intra-Hemodialysis Comments Tx initiated  Fistula / Graft Left Upper arm  Placement Date: 05/17/18   Placed prior to admission: Yes  Orientation: Left  Access Location: Upper arm  Status Accessed  Needle Size 15 g  Drainage Description None

## 2018-11-23 NOTE — Progress Notes (Signed)
Post HD assessment; no change from baseline, pt remains confused unaware he is hospitalized although reminded, Pt did try to get up and out of bed which is a safety concern, RN made aware/MD (nephrologist also Made Aware.    11/23/18 2220  Neurological  Level of Consciousness Responds to Voice  Orientation Level Oriented to person  Respiratory  Respiratory Pattern Regular  Chest Assessment Chest expansion symmetrical  Bilateral Breath Sounds Expiratory wheezes  Cardiac  Pulse Regular  Heart Sounds S1, S2  ECG Monitor Yes  Cardiac Rhythm NSR  Vascular  R Radial Pulse +2  L Radial Pulse +2  Edema Generalized  Generalized Edema None  Psychosocial  Psychosocial (WDL) X  Patient Behaviors Uncooperative;Agitated;Irritable  Emotional support given Given to patient

## 2018-11-23 NOTE — Progress Notes (Signed)
Pre HD Assessment    11/23/18 1816  Neurological  Level of Consciousness Responds to Voice  Orientation Level Oriented to person  Respiratory  Respiratory Pattern Regular  Chest Assessment Chest expansion symmetrical  Bilateral Breath Sounds Expiratory wheezes  Cough Non-productive;Strong  Cardiac  Pulse Regular  Heart Sounds S1, S2  ECG Monitor Yes  Cardiac Rhythm NSR  Vascular  R Radial Pulse +2  L Radial Pulse +2  Edema Generalized  Generalized Edema None  Psychosocial  Psychosocial (WDL) X  Patient Behaviors Uncooperative;Agitated;Irritable  Emotional support given Given to patient

## 2018-11-23 NOTE — ED Provider Notes (Signed)
[facility] Emergency Department Provider Note  ____________________________________________  Time seen: Approximately 10:12 AM  I have reviewed the triage vital signs and the nursing notes.   HISTORY  Chief Complaint Altered Mental Status and Influenza    HPI Jeremy [name] is a 58 y.o. male with ESRD on HD last dialyzed 4 days ago, diagnosed with influenza B 3/15, presenting for altered mental status.  The patient is unable to provide any history and his only verbal response is his name.  I have reviewed the patient's chart and see that he was discharged 3/14 after admission for hyperkalemia requiring dialysis, and hypoglycemia.  He then re-presented to the emergency department 3/15 with fever and cough and was found to be positive for influenza B with a normal chest x-ray.  He was discharged yesterday.  EMS reports that his wife called for altered mental status.  The patient was hypoglycemic and received received glucagon in route.  Upon arrival his blood sugar is 65.  Here, the patient is alert and able to move, follows some commands, but is non-verbal.  He is actively coughing with normal oxygen saturations.  Past Medical History:  Diagnosis Date  . Bronchitis   . Hypertension   . Renal disorder    kidney failure, dialysis    Patient Active Problem List   Diagnosis Date Noted  . Influenza B 11/21/2018  . Pulmonary edema 09/22/2018  . Hyperkalemia 05/16/2018    Past Surgical History:  Procedure Laterality Date  . DIALYSIS FISTULA CREATION      Current Outpatient Rx  . Order #: [id number] Class: Normal  . Order #: [id number] Class: Historical Med  . Order #: [id number] Class: Print  . Order #: [id number] Class: Normal  . Order #: [id number] Class: Historical Med  . Order #: [id number] Class: Historical Med  . Order #: [id number] Class: Print  . Order #: [id number] Class: Historical Med  . Order #: [id number] Class: Historical Med    Allergies Patient has no  known allergies.  Family History  Problem Relation Age of Onset  . Hypertension Mother     Social History Social History   Tobacco Use  . Smoking status: Current Every Day Smoker    Packs/day: 1.00    Types: Cigarettes  . Smokeless tobacco: Never Used  Substance Use Topics  . Alcohol use: Never    Frequency: Never  . Drug use: Yes    Types: Marijuana    Review of Systems Unable to obtain due to patient altered mental status.   ____________________________________________   PHYSICAL EXAM:  VITAL SIGNS: ED Triage Vitals  Enc Vitals Group     BP --      Pulse Rate 11/23/18 1006 64     Resp 11/23/18 1006 20     Temp 11/23/18 1006 98.2 F (36.8 C)     Temp Source 11/23/18 1006 Oral     SpO2 11/23/18 0958 97 %     Weight --      Height --      Head Circumference --      Peak Flow --      Pain Score --      Pain Loc --      Pain Edu? --      Excl. in [place]? --     Constitutional: The patient is alert and protecting his airway, follow some basic commands, but is nonverbal.  Chronically ill-appearing. Eyes: Conjunctivae are normal.  EOMI. No scleral icterus. Head: Atraumatic. Nose: No congestion/rhinnorhea. Mouth/Throat: Mucous membranes are moist.  Neck: No stridor.  Supple.  No JVD.  No meningismus. Cardiovascular: Normal rate, regular rhythm. No murmurs, rubs or gallops.  Respiratory: The patient is mildly tachypneic without accessory muscle use or retractions.  A my exam his oxygen saturations are in the mid 90s on room air.  He does have an active cough.  On my exam, he has rales in the left lung base and some mild expiratory wheezing. Gastrointestinal: Soft, nontender and nondistended.  No guarding or rebound.  No peritoneal signs. Musculoskeletal: No LE edema.  Neurologic: Alert and protecting airway..  Nonverbal.  Face  symmetric.  EOMI.  Moves all extremities well. Skin:  Skin is warm, dry and intact. No rash noted. Psychiatric: Unable to assess due to  nonverbal. ____________________________________________   LABS (all labs ordered are listed, but only abnormal results are displayed)  Labs Reviewed  GLUCOSE, CAPILLARY - Abnormal; Notable for the following components:      Result Value   Glucose-Capillary 65 (*)    All other components within normal limits  CULTURE, BLOOD (ROUTINE X 2)  CULTURE, BLOOD (ROUTINE X 2)  LACTIC ACID, PLASMA  LACTIC ACID, PLASMA  COMPREHENSIVE METABOLIC PANEL  CBC WITH DIFFERENTIAL/PLATELET  URINALYSIS, ROUTINE W REFLEX MICROSCOPIC  BRAIN NATRIURETIC PEPTIDE  BLOOD GAS, VENOUS  TROPONIN I   ____________________________________________  EKG  ED ECG REPORT I, Anne-Caroline Mariea Clonts, the attending physician, personally viewed and interpreted this ECG.   Date: 11/23/2018  EKG Time: 1002  Rate: 65  Rhythm: normal sinus rhythm  Axis: normal  Intervals:first-degree A-V block   ST&T Change: No STEMI; peaked T waves that are seen in V4, V5 and V6.  No QRS widening at this time  ____________________________________________  RADIOLOGY  No results found.  ____________________________________________   PROCEDURES  Procedure(s) performed: None  Procedures  Critical Care performed: Yes, see critical care note(s) ____________________________________________   INITIAL IMPRESSION / ASSESSMENT AND PLAN / ED COURSE  Pertinent labs & imaging results that were available during my care of the patient were reviewed by me and considered in my medical decision making (see chart for details).  58 y.o. male with ESRD on hemodialysis presenting for altered mental status and hypoglycemia.  Overall, I am concerned about multiple possible etiologies.  The patient may be altered due to his hypoglycemia and his repeat glucose is 65; an amp of dextrose has been ordered and we will do a swallow study to see if the patient is able to eat and drink in an attempt to maintain his blood sugars.  We will also check his  blood sugar every 30 minutes until he is at least 4 normal blood sugars.  The patient infectious symptoms are most likely due to his influenza B.  He may also be coughing due to fluid overload.  A chest x-ray has been ordered.  He is unable to answer any questions about travel or sick contact; coronavirus is considered but low risk with this known influenza infection.  We will also check an EKG for evidence of hyperkalemia while we are waiting for his labs to come back.  Antibiotics and Tamiflu at renal dosing have been ordered; blood cultures have been obtained.  The patient will not receive intravenous fluids at this time given his likely fluid overload.  The patient will likely require readmission.  ----------------------------------------- 10:31 AM on 11/23/2018 -----------------------------------------  Patient does have EKG changes that are concerning for hyperkalemia.  I have ordered calcium gluconate, as well as bicarb.  Given recent coronavirus  concerns, we will try to avoid nebulized albuterol; the patient is not a candidate for insulin given his hypoglycemia even with dextrose.  Bicarbonate has been ordered.  I am awaiting his test results and anticipate dialysis today.  ----------------------------------------- 11:38 AM on 11/23/2018 -----------------------------------------  The patient has a potassium of 5.2 today.  He continues to be hemodynamically stable although his blood pressure is 107/52 which may be a relative hypotension for him given that he has not had dialysis in 4 days.  I will contact the nephrologist on-call for dialysis.  The patient's blood sugar was last 238.  His lactic acid is 1.1.  His BNP is 414, which is improved compared to prior.  However his troponin is higher than usual at 0.11; aspirin has been ordered.  A CT of the head is pending.  He continues to be altered.  ____________________________________________  FINAL CLINICAL IMPRESSION(S) / ED DIAGNOSES  Final  diagnoses:  None         NEW MEDICATIONS STARTED DURING THIS VISIT:  New Prescriptions   No medications on file      Eula Listen, MD 11/23/18 1139

## 2018-11-23 NOTE — ED Notes (Signed)
Top dentures in cup placed belongings bag at bedside.

## 2018-11-23 NOTE — Progress Notes (Signed)
Post Hd Tx    11/23/18 2215  Vital Signs  Temp 97.7 F (36.5 C)  Temp Source Oral  Pulse Rate 83  Pulse Rate Source Dinamap  Resp 18  BP 130/60  BP Location Right Arm  BP Method Automatic  Patient Position (if appropriate) Lying  Oxygen Therapy  SpO2 100 %  O2 Device Room Air  Pain Assessment  Pain Scale 0-10  Pain Score 0  Dialysis Weight  Weight 61.8 kg  Type of Weight Post-Dialysis  Post-Hemodialysis Assessment  Rinseback Volume (mL) 250 mL  KECN 68.3 V  Dialyzer Clearance Lightly streaked  Duration of HD Treatment -hour(s) 3.25 hour(s) (Pt became irritable and tried to get out of bed, 15 min lost)  Hemodialysis Intake (mL) 500 mL  UF Total -Machine (mL) 2003 mL  Net UF (mL) 1503 mL  Tolerated HD Treatment Yes  AVG/AVF Arterial Site Held (minutes) 10 minutes  AVG/AVF Venous Site Held (minutes) 10 minutes  Fistula / Graft Left Upper arm  Placement Date: 05/17/18   Placed prior to admission: Yes  Orientation: Left  Access Location: Upper arm  Site Condition No complications  Status Deaccessed  Drainage Description None

## 2018-11-23 NOTE — ED Notes (Signed)
Pt does not make urine. Second blood culture not obtained before starting antibiotics due to delay of patient care. Pt gives resistance when attempting to start IV. Left arm restricted due to dialysis. Informed patient of need for second PIV insertion and will attempt if pt complies.

## 2018-11-23 NOTE — H&P (Addendum)
[place] [facility] [place] NAME: Jeremy [name]    MR#:  [id number]  DATE OF BIRTH:  1960/10/02  DATE OF ADMISSION:  11/23/2018  PRIMARY CARE PHYSICIAN: Patient, No Pcp Per   REQUESTING/REFERRING PHYSICIAN: Dr. Mariea Clonts  CHIEF COMPLAINT:   Chief Complaint  Patient presents with  . Altered Mental Status  . Influenza    HISTORY OF PRESENT ILLNESS:  Jeremy [name]  is a 58 y.o. male with a known history of end-stage renal disease, noncompliance, hypertension, recently diagnosed influenza presents to the emergency room brought in by his fiance due to altered mental status.  Patient was recently in the hospital for influenza, hyperkalemia and missed hemodialysis.  Yesterday he refused his last round of dialysis and left the hospital.  Today he presents with worsening BUN and creatinine along with altered mental status.  CT scan of the head is normal.  Chest x-ray is clear.  Patient is afebrile with normal heart rate and blood pressure.  Lactic acid normal.  Was found to have blood glucose of 68 and given an amp of D50.  Potassium of 5.2.  PAST MEDICAL HISTORY:   Past Medical History:  Diagnosis Date  . Bronchitis   . Hypertension   . Renal disorder    kidney failure, dialysis    PAST SURGICAL HISTORY:   Past Surgical History:  Procedure Laterality Date  . DIALYSIS FISTULA CREATION      SOCIAL HISTORY:   Social History   Tobacco Use  . Smoking status: Current Every Day Smoker    Packs/day: 1.00    Types: Cigarettes  . Smokeless tobacco: Never Used  Substance Use Topics  . Alcohol use: Never    Frequency: Never    FAMILY HISTORY:   Family History  Problem Relation Age of Onset  . Hypertension Mother     DRUG ALLERGIES:  No Known Allergies  REVIEW OF SYSTEMS:   Review of Systems  Unable to perform ROS: Mental status change    MEDICATIONS AT HOME:   Prior to Admission medications   Medication Sig Start Date End Date Taking?  Authorizing Provider  albuterol (PROVENTIL HFA;VENTOLIN HFA) 108 (90 Base) MCG/ACT inhaler Inhale 2 puffs into the lungs every 6 (six) hours as needed for wheezing or shortness of breath. 11/19/18   Nena Polio, MD  amLODipine (NORVASC) 10 MG tablet Take 10 mg by mouth daily. 03/29/18   [provider]  gabapentin (NEURONTIN) 100 MG capsule Take 1 capsule (100 mg total) by mouth daily as needed (pain). 11/22/18   Bettey Costa, MD  guaiFENesin-dextromethorphan (ROBITUSSIN DM) 100-10 MG/5ML syrup Take 5 mLs by mouth every 4 (four) hours as needed for cough. 11/20/18   Vaughan Basta, MD  metoprolol tartrate (LOPRESSOR) 25 MG tablet Take 25 mg by mouth daily. 03/29/18   [provider]  Multiple Vitamins-Iron (MULTI-DAY PLUS IRON) TABS Take 1 tablet by mouth daily.    [provider]  oseltamivir (TAMIFLU) 30 MG capsule Take 1 capsule (30 mg total) by mouth every other day. Only on days after dialysis 11/22/18   Bettey Costa, MD  sevelamer carbonate (RENVELA) 800 MG tablet Take 1,600 mg by mouth 3 (three) times daily with meals. 03/02/18   [provider]  VELPHORO 500 MG chewable tablet Chew 500 mg by mouth 6 (six) times daily. 05/27/18   [provider]     VITAL SIGNS:  Blood pressure 128/61, pulse 66, temperature 97.7 F (36.5 C),  temperature source Oral, resp. rate 16, SpO2 100 %.  PHYSICAL EXAMINATION:  Physical Exam  GENERAL:  58 y.o.-year-old patient lying in the bed EYES: Pupils equal, round, reactive to light and accommodation. No scleral icterus. Extraocular muscles intact.  HEENT: Head atraumatic, normocephalic. Oropharynx and nasopharynx clear. No oropharyngeal erythema, moist oral mucosa  NECK:  Supple, no jugular venous distention. No thyroid enlargement, no tenderness.  LUNGS: Normal breath sounds bilaterally, no wheezing, rales, rhonchi. No use of accessory muscles of respiration.  CARDIOVASCULAR: S1, S2 normal. No murmurs, rubs, or  gallops.  ABDOMEN: Soft, nontender, nondistended. Bowel sounds present. No organomegaly or mass.  EXTREMITIES: No pedal edema, cyanosis, or clubbing. + 2 pedal & radial pulses b/l.   NEUROLOGIC: Not following instructions but able to move all his extremities.  No flaccidity noticed. PSYCHIATRIC: The patient opens eyes on calling his name but does not speak.  Confused. SKIN: No obvious rash, lesion, or ulcer.   LABORATORY PANEL:   CBC Recent Labs  Lab 11/23/18 1021  WBC 3.4*  HGB 12.3*  HCT 39.8  PLT 183   ------------------------------------------------------------------------------------------------------------------  Chemistries  Recent Labs  Lab 11/23/18 1021  NA 140  K 5.2*  CL 102  CO2 19*  GLUCOSE 68*  BUN 76*  CREATININE 16.09*  CALCIUM 8.5*  AST 30  ALT 14  ALKPHOS 92  BILITOT 1.1   ------------------------------------------------------------------------------------------------------------------  Cardiac Enzymes Recent Labs  Lab 11/23/18 1021  TROPONINI 0.11*   ------------------------------------------------------------------------------------------------------------------  RADIOLOGY:  Ct Head Wo Contrast  Result Date: 11/23/2018 CLINICAL DATA:  Altered mental status.  Renal failure EXAM: CT HEAD WITHOUT CONTRAST TECHNIQUE: Contiguous axial images were obtained from the base of the skull through the vertex without intravenous contrast. COMPARISON:  None. FINDINGS: Brain: The ventricles and sulci are upper normal in size for age. There is no intracranial mass, hemorrhage, extra-axial fluid collection, or midline shift. There is slight small vessel disease in the centra semiovale bilaterally. There is evidence of a prior tiny lacunar infarct in the head of the caudate nucleus on the left. No acute appearing infarct is evident. Vascular: There is no appreciable hyperdense vessel. There is calcification in each carotid siphon and distal vertebral artery region.  Skull: There are changes of osteomalacia in the calvarium. No lytic or destructive lesions. Sinuses/Orbits: There is mucosal thickening in several ethmoid air cells. Other visualized paranasal sinuses are clear. Orbits appear symmetric bilaterally. Other: Mastoid air cells are clear. IMPRESSION: Slight periventricular small vessel disease. 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 No acute infarct evident. No mass or hemorrhage. Foci of arterial vascular calcification noted. Mucosal thickening noted in several ethmoid air cells. Electronically Signed   By: Lowella Grip III M.D.   On: 11/23/2018 12:20   Dg Chest Port 1 View  Result Date: 11/23/2018 CLINICAL DATA:  Altered mental status.  Recent diagnosis of flu EXAM: PORTABLE CHEST 1 VIEW COMPARISON:  Two days ago FINDINGS: Cardiomegaly. Chronic generalized interstitial coarsening without Kerley line. No acute airspace opacity. Venous stenting and osteopenia. IMPRESSION: No acute finding when compared to priors. Electronically Signed   By: Monte Fantasia M.D.   On: 11/23/2018 10:31   Dg Chest Portable 1 View  Result Date: 11/21/2018 CLINICAL DATA:  Cough and fever EXAM: PORTABLE CHEST 1 VIEW COMPARISON:  November 19, 2018 FINDINGS: There is no appreciable edema or consolidation. Heart is enlarged, stable, with pulmonary vascularity within normal limits. Stent is noted in the left axillary region. No adenopathy.  There is aortic atherosclerosis. No bone lesions. IMPRESSION: Stable cardiomegaly without frank edema or consolidation. Aortic Atherosclerosis (ICD10-I70.0). Electronically Signed   By: Lowella Grip III M.D.   On: 11/21/2018 15:45     IMPRESSION AND PLAN:   *Acute metabolic encephalopathy likely secondary to influenza and missing hemodialysis.  CT scan of the head is normal.  Will check urine drug screen, ammonia. Fall precaution  *Hyperkalemia with end-stage renal disease on hemodialysis.  Contacted Dr. Zollie Scale.   Patient will be scheduled for urgent hemodialysis.  *Influenza A.  Continue Tamiflu to finish 5-day course.  *Hypertension.  Hold medications due to normal blood pressure at this time.  Restart if blood pressure trends up.  *Depression.  His fiance is concerned about he leaving AMA yesterday and I have requested psychiatry consult for possible self neglect  * Hypoglycemia due to poor PO intake. IMproved with D50. Accuchecks Q4 hrs.  * DVT prophylaxis Heparin  All the records are reviewed and case discussed with ED provider. Management plans discussed with the patient, family and they are in agreement.  CODE STATUS: FULL CODE  TOTAL CRITICAL CARE TIME TAKING CARE OF THIS PATIENT: 40 minutes.   Leia Alf Makenzye Troutman M.D on 11/23/2018 at 2:06 PM  Between 7am to 6pm - Pager - [phone]  After 6pm go to [email or web address] - password [email or web address] [facility]  Office  [phone]  CC: Primary care physician; Patient, No Pcp Per  Note: This dictation was prepared with Dragon dictation along with smaller phrase technology. Any transcriptional errors that result from this process are unintentional.

## 2018-11-23 NOTE — ED Triage Notes (Signed)
Patient arrived via [facility] from home for altered mental status per wife. Pt diagnosed with flu on Friday. Has not received dialysis since 3/13. BG 58 en route, given glucagon in en route.

## 2018-11-23 NOTE — BH Assessment (Cosign Needed)
Jeremy [name] is a 58 y.o. male who presents with altered mental status. Psych consult order for patient to be assess psychartricly. When this provider went to bedside to see patient is unstable medically. Discussion with patient nurse, Ms. Geoffery Lyons that patient is alert to self and he has a lot of medical issues at hand. The patient was at [facility] on 11/22/2018 where he left without receiving his dialysis. As per Ms. Scarlett, Machine setup in patient room, tx procedure explained and machine test in progress. At that point, pt agreeing to dialysis. Following completion of the machine test, pt now refusing dialysis. Pt was explained risks of missing dialysis tx. RN Rodman Key at bedside educating pt as well. Pt's fiance speaking with patient.  The plan was discussed that psychiatry will see patient when he is medically stable.

## 2018-11-23 NOTE — Progress Notes (Signed)
Pt infiltrated his arterial needle, is very uncooperative about keeping access arm still. MD notified.

## 2018-11-23 NOTE — Progress Notes (Signed)
Pt is refusing is refusing telemetry. RN try to talk to him the importancee of having the telemetry box but the pt stated he doesn't want it and remove tele box from his chest. RN notified MD. Per MD okay to DC order.

## 2018-11-24 DIAGNOSIS — E875 Hyperkalemia: Secondary | ICD-10-CM | POA: Diagnosis not present

## 2018-11-24 DIAGNOSIS — G9341 Metabolic encephalopathy: Secondary | ICD-10-CM | POA: Diagnosis not present

## 2018-11-24 DIAGNOSIS — G9340 Encephalopathy, unspecified: Secondary | ICD-10-CM

## 2018-11-24 LAB — CBC
HCT: 35.4 % — ABNORMAL LOW (ref 39.0–52.0)
HEMOGLOBIN: 11.4 g/dL — AB (ref 13.0–17.0)
MCH: 30.4 pg (ref 26.0–34.0)
MCHC: 32.2 g/dL (ref 30.0–36.0)
MCV: 94.4 fL (ref 80.0–100.0)
Platelets: 207 10*3/uL (ref 150–400)
RBC: 3.75 MIL/uL — AB (ref 4.22–5.81)
RDW: 16.3 % — ABNORMAL HIGH (ref 11.5–15.5)
WBC: 1.8 10*3/uL — ABNORMAL LOW (ref 4.0–10.5)
nRBC: 0 % (ref 0.0–0.2)

## 2018-11-24 LAB — COMPREHENSIVE METABOLIC PANEL
ALT: 14 U/L (ref 0–44)
AST: 31 U/L (ref 15–41)
Albumin: 3.1 g/dL — ABNORMAL LOW (ref 3.5–5.0)
Alkaline Phosphatase: 86 U/L (ref 38–126)
Anion gap: 16 — ABNORMAL HIGH (ref 5–15)
BILIRUBIN TOTAL: 0.8 mg/dL (ref 0.3–1.2)
BUN: 54 mg/dL — ABNORMAL HIGH (ref 6–20)
CO2: 22 mmol/L (ref 22–32)
CREATININE: 11.65 mg/dL — AB (ref 0.61–1.24)
Calcium: 7.6 mg/dL — ABNORMAL LOW (ref 8.9–10.3)
Chloride: 93 mmol/L — ABNORMAL LOW (ref 98–111)
GFR calc Af Amer: 5 mL/min — ABNORMAL LOW (ref >60)
GFR calc non Af Amer: 4 mL/min — ABNORMAL LOW (ref >60)
Glucose, Bld: 376 mg/dL — ABNORMAL HIGH (ref 70–99)
POTASSIUM: 4.6 mmol/L (ref 3.5–5.1)
Sodium: 131 mmol/L — ABNORMAL LOW (ref 135–145)
Total Protein: 7 g/dL (ref 6.5–8.1)

## 2018-11-24 LAB — AMMONIA: Ammonia: 16 umol/L (ref 9–35)

## 2018-11-24 LAB — TSH: TSH: 0.98 u[IU]/mL (ref 0.350–4.500)

## 2018-11-24 LAB — GLUCOSE, CAPILLARY
GLUCOSE-CAPILLARY: 309 mg/dL — AB (ref 70–99)
Glucose-Capillary: 115 mg/dL — ABNORMAL HIGH (ref 70–99)
Glucose-Capillary: 116 mg/dL — ABNORMAL HIGH (ref 70–99)
Glucose-Capillary: 242 mg/dL — ABNORMAL HIGH (ref 70–99)

## 2018-11-24 LAB — MAGNESIUM: Magnesium: 2 mg/dL (ref 1.7–2.4)

## 2018-11-24 LAB — PHOSPHORUS: Phosphorus: 6.3 mg/dL — ABNORMAL HIGH (ref 2.5–4.6)

## 2018-11-24 LAB — FOLATE: Folate: 21 ng/mL (ref >5.9)

## 2018-11-24 LAB — VITAMIN B12: Vitamin B-12: 3210 pg/mL — ABNORMAL HIGH (ref 180–914)

## 2018-11-24 MED ORDER — INSULIN ASPART 100 UNIT/ML ~~LOC~~ SOLN
0.0000 [IU] | Freq: Three times a day (TID) | SUBCUTANEOUS | Status: DC
  Administered 2018-11-24: 3 [IU] via SUBCUTANEOUS
  Administered 2018-11-24: 7 [IU] via SUBCUTANEOUS
  Filled 2018-11-24 (×2): qty 1

## 2018-11-24 MED ORDER — IPRATROPIUM-ALBUTEROL 0.5-2.5 (3) MG/3ML IN SOLN: 3.0000 mL | RESPIRATORY_TRACT | Status: DC | PRN

## 2018-11-24 NOTE — Consult Note (Addendum)
[facility] Consult   Reason for Consult:  Evaluate for depression and self-neglect Referring Physician:  Hillary Bow, MD Patient Identification: Jeremy [name] MRN:  [id number] Principal Diagnosis: <principal problem not specified> Diagnosis:  Active Problems:   Acute encephalopathy   Total Time spent with patient: 45 minutes  Subjective and HPI:   Jeremy [name] is a 58 y.o. male patient with history of end-stage renal disease on hemodialysis who was admitted with altered mental status and influenza B.  Pt reportedly has a history of treatment non-compliance.  A few days ago, pt presented to the ED, but left prior to hemodialysis.  Fiance requested psych evaluation b/c she thought maybe pt is depressed.  Psychiatric NP attempted to see patient yesterday while in the ED, but he was confused and disoriented and eval could not be completed at that time.    Pt reportedly received hemodialysis yesterday evening. This morning pt's mentation is back to baseline.  He's oriented x 3.  Pt was irritated that a psychiatric consult was placed; however, he did agree to talk with me.  He states that he is not depressed or anxious.  He's upset that he's unable to reach his fiance via phone and asks if I can try giving her a call.  I attempted to contact pt's fiance [name] [phone] and [phone], but no answer.    Pt states he's never seen a psychiatrist or therapist and states "I'm not crazy."  He doesn't want to have any outpatient psychiatric care.  He denies SI, HI, AH, VH, paranoia.  Pt states "I'm too blessed to ever want to harm myself or anybody."  He states he understands that he needs dialysis and he agrees to continue with dialysis three days a week.    Pt denies alcohol, illicit drug use.   Past Psychiatric History: pt denies prior history  Risk to Self:  no, pt denies SI Risk to Others:   no, pt denies HI Prior Inpatient Therapy:  pt denies Prior Outpatient Therapy:   pt denies  Past Medical History:  Past Medical History:  Diagnosis Date  . Bronchitis   . Hypertension   . Renal disorder    kidney failure, dialysis    Past Surgical History:  Procedure Laterality Date  . DIALYSIS FISTULA CREATION     Family History:  Family History  Problem Relation Age of Onset  . Hypertension Mother    Family Psychiatric  History: unknown Social History:  Social History   Substance and Sexual Activity  Alcohol Use Never  . Frequency: Never     Social History   Substance and Sexual Activity  Drug Use Yes  . Types: Marijuana    Social History   Socioeconomic History  . Marital status: Divorced    Spouse name: Not on file  . Number of children: Not on file  . Years of education: Not on file  . Highest education level: Not on file  Occupational History  . Not on file  Social Needs  . Financial resource strain: Not on file  . Food insecurity:    Worry: Not on file    Inability: Not on file  . Transportation needs:    Medical: Not on file    Non-medical: Not on file  Tobacco Use  . Smoking status: Current Every Day Smoker    Packs/day: 1.00    Types: Cigarettes  . Smokeless tobacco: Never Used  Substance and Sexual Activity  . Alcohol use: Never  Frequency: Never  . Drug use: Yes    Types: Marijuana  . Sexual activity: Not on file  Lifestyle  . Physical activity:    Days per week: Not on file    Minutes per session: Not on file  . Stress: Not on file  Relationships  . Social connections:    Talks on phone: Not on file    Gets together: Not on file    Attends religious service: Not on file    Active member of club or organization: Not on file    Attends meetings of clubs or organizations: Not on file    Relationship status: Not on file  Other Topics Concern  . Not on file  Social History Narrative  . Not on file   Additional Social History: engaged    Allergies:  No Known Allergies  Labs:  Results for orders placed  or performed during the hospital encounter of 11/23/18 (from the past 48 hour(s))  Glucose, capillary     Status: Abnormal   Collection Time: 11/23/18 10:07 AM  Result Value Ref Range   Glucose-Capillary 65 (L) 70 - 99 mg/dL  Blood gas, venous ([facility], [facility], [facility])     Status: Abnormal   Collection Time: 11/23/18 10:10 AM  Result Value Ref Range   pH, Ven 7.31 7.250 - 7.430   pCO2, Ven 42 (L) 44.0 - 60.0 mmHg   pO2, Ven 34.0 32.0 - 45.0 mmHg   Bicarbonate 21.1 20.0 - 28.0 mmol/L   Acid-base deficit 5.0 (H) 0.0 - 2.0 mmol/L   O2 Saturation 58.7 %   Patient temperature 37.0    Collection site VEIN    Sample type VENOUS     Comment: Performed at [facility], [address] [place], [place] [address]  Lactic acid, plasma     Status: None   Collection Time: 11/23/18 10:21 AM  Result Value Ref Range   Lactic Acid, Venous 1.1 0.5 - 1.9 mmol/L    Comment: Performed at [facility], [address] [place], [place] [address]  Comprehensive metabolic panel     Status: Abnormal   Collection Time: 11/23/18 10:21 AM  Result Value Ref Range   Sodium 140 135 - 145 mmol/L   Potassium 5.2 (H) 3.5 - 5.1 mmol/L   Chloride 102 98 - 111 mmol/L   CO2 19 (L) 22 - 32 mmol/L   Glucose, Bld 68 (L) 70 - 99 mg/dL   BUN 76 (H) 6 - 20 mg/dL   Creatinine, Ser 16.09 (H) 0.61 - 1.24 mg/dL   Calcium 8.5 (L) 8.9 - 10.3 mg/dL   Total Protein 7.4 6.5 - 8.1 g/dL   Albumin 3.3 (L) 3.5 - 5.0 g/dL   AST 30 15 - 41 U/L   ALT 14 0 - 44 U/L   Alkaline Phosphatase 92 38 - 126 U/L   Total Bilirubin 1.1 0.3 - 1.2 mg/dL   GFR calc non Af Amer 3 (L) >60 mL/min   GFR calc Af Amer 3 (L) >60 mL/min   Anion gap 19 (H) 5 - 15    Comment: Performed at [facility], [address] [place], [place] [address]  CBC WITH DIFFERENTIAL     Status: Abnormal   Collection Time: 11/23/18 10:21 AM  Result Value Ref Range   WBC 3.4 (L) 4.0 - 10.5 K/uL   RBC 4.06 (L) 4.22 - 5.81 MIL/uL   Hemoglobin 12.3 (L)  13.0 - 17.0 g/dL   HCT 39.8  39.0 - 52.0 %   MCV 98.0 80.0 - 100.0 fL   MCH 30.3 26.0 - 34.0 pg   MCHC 30.9 30.0 - 36.0 g/dL   RDW 16.3 (H) 11.5 - 15.5 %   Platelets 183 150 - 400 K/uL   nRBC 0.0 0.0 - 0.2 %   Neutrophils Relative % 64 %   Neutro Abs 2.2 1.7 - 7.7 K/uL   Lymphocytes Relative 22 %   Lymphs Abs 0.7 0.7 - 4.0 K/uL   Monocytes Relative 10 %   Monocytes Absolute 0.4 0.1 - 1.0 K/uL   Eosinophils Relative 2 %   Eosinophils Absolute 0.1 0.0 - 0.5 K/uL   Basophils Relative 1 %   Basophils Absolute 0.0 0.0 - 0.1 K/uL   WBC Morphology MORPHOLOGY UNREMARKABLE    Smear Review MORPHOLOGY UNREMARKABLE    Immature Granulocytes 1 %   Abs Immature Granulocytes 0.02 0.00 - 0.07 K/uL   Burr Cells PRESENT     Comment: Performed at [facility], [address], [place], [place] [address]  Brain natriuretic peptide     Status: Abnormal   Collection Time: 11/23/18 10:21 AM  Result Value Ref Range   B Natriuretic Peptide 414.0 (H) 0.0 - 100.0 pg/mL    Comment: Performed at [facility], [address] [place], [place] [address]  Troponin I - ONCE - STAT     Status: Abnormal   Collection Time: 11/23/18 10:21 AM  Result Value Ref Range   Troponin I 0.11 (HH) <0.03 ng/mL    Comment: CRITICAL VALUE NOTED. VALUE IS CONSISTENT WITH PREVIOUSLY REPORTED/CALLED VALUE RDW/KLW Performed at [facility], [address] [place], [place] [address]   Blood Culture (routine x 2)     Status: None (Preliminary result)   Collection Time: 11/23/18 10:27 AM  Result Value Ref Range   Specimen Description BLOOD RIGHT ARM    Special Requests      BOTTLES DRAWN AEROBIC AND ANAEROBIC Blood Culture adequate volume   Culture      NO GROWTH < 24 HOURS Performed at [facility], [address] [place], [place] [address]    Report Status PENDING   Glucose, capillary     Status: Abnormal   Collection Time: 11/23/18 10:39 AM  Result Value Ref Range    Glucose-Capillary 238 (H) 70 - 99 mg/dL  Blood Culture (routine x 2)     Status: None (Preliminary result)   Collection Time: 11/23/18 10:50 AM  Result Value Ref Range   Specimen Description BLOOD BLOOD RIGHT HAND    Special Requests      BOTTLES DRAWN AEROBIC AND ANAEROBIC Blood Culture adequate volume   Culture      NO GROWTH < 24 HOURS Performed at [facility], [address] [place], [place] [address]    Report Status PENDING   Glucose, capillary     Status: Abnormal   Collection Time: 11/23/18 11:19 AM  Result Value Ref Range   Glucose-Capillary 179 (H) 70 - 99 mg/dL  Glucose, capillary     Status: Abnormal   Collection Time: 11/23/18 11:58 AM  Result Value Ref Range   Glucose-Capillary 174 (H) 70 - 99 mg/dL  Lactic acid, plasma     Status: None   Collection Time: 11/23/18 12:09 PM  Result Value Ref Range   Lactic Acid, Venous 0.9 0.5 - 1.9 mmol/L    Comment: Performed at [facility], [address], [place], [place] [address]  Glucose, capillary     Status: Abnormal   Collection Time: 11/23/18  3:25 PM  Result Value Ref Range   Glucose-Capillary 66 (L) 70 - 99 mg/dL  Glucose, capillary     Status: Abnormal   Collection Time: 11/23/18  4:18 PM  Result Value Ref Range   Glucose-Capillary 61 (L) 70 - 99 mg/dL  Glucose, capillary     Status: None   Collection Time: 11/23/18  5:29 PM  Result Value Ref Range   Glucose-Capillary 78 70 - 99 mg/dL  Glucose, capillary     Status: Abnormal   Collection Time: 11/23/18 11:59 PM  Result Value Ref Range   Glucose-Capillary 116 (H) 70 - 99 mg/dL   Comment 1 Notify RN   Glucose, capillary     Status: Abnormal   Collection Time: 11/24/18  4:15 AM  Result Value Ref Range   Glucose-Capillary 115 (H) 70 - 99 mg/dL   Comment 1 Notify RN   Vitamin B12     Status: Abnormal   Collection Time: 11/24/18  6:03 AM  Result Value Ref Range   Vitamin B-12 3,210 (H) 180 - 914 pg/mL    Comment: (NOTE) This assay is not  validated for testing neonatal or myeloproliferative syndrome specimens for Vitamin B12 levels. Performed at [facility], [place] [address] [place], [place] [address]   Ammonia     Status: None   Collection Time: 11/24/18  6:03 AM  Result Value Ref Range   Ammonia 16 9 - 35 umol/L    Comment: Performed at [facility], [address] [place], [place] [address]  CBC     Status: Abnormal   Collection Time: 11/24/18  6:07 AM  Result Value Ref Range   WBC 1.8 (L) 4.0 - 10.5 K/uL   RBC 3.75 (L) 4.22 - 5.81 MIL/uL   Hemoglobin 11.4 (L) 13.0 - 17.0 g/dL   HCT 35.4 (L) 39.0 - 52.0 %   MCV 94.4 80.0 - 100.0 fL   MCH 30.4 26.0 - 34.0 pg   MCHC 32.2 30.0 - 36.0 g/dL   RDW 16.3 (H) 11.5 - 15.5 %   Platelets 207 150 - 400 K/uL   nRBC 0.0 0.0 - 0.2 %    Comment: Performed at [facility], [address] [place], [place] [address]  TSH     Status: None   Collection Time: 11/24/18  6:07 AM  Result Value Ref Range   TSH 0.980 0.350 - 4.500 uIU/mL    Comment: Performed by a 3rd Generation assay with a functional sensitivity of <=0.01 uIU/mL. Performed at [facility], [address] [place], [place] [address]   Folate     Status: None   Collection Time: 11/24/18  6:07 AM  Result Value Ref Range   Folate 21.0 >5.9 ng/mL    Comment: Performed at [facility], [address] [place], [place] [address]  Magnesium     Status: None   Collection Time: 11/24/18  6:07 AM  Result Value Ref Range   Magnesium 2.0 1.7 - 2.4 mg/dL    Comment: Performed at [facility], [address] [place], [place] [address]  Phosphorus     Status: Abnormal   Collection Time: 11/24/18  6:07 AM  Result Value Ref Range   Phosphorus 6.3 (H) 2.5 - 4.6 mg/dL    Comment: Performed at [facility], [address] [place], [place] [address]  Glucose, capillary  Status: Abnormal   Collection Time: 11/24/18  7:23 AM  Result Value Ref Range    Glucose-Capillary 309 (H) 70 - 99 mg/dL   Comment 1 Notify RN   Comprehensive metabolic panel     Status: Abnormal   Collection Time: 11/24/18  8:11 AM  Result Value Ref Range   Sodium 131 (L) 135 - 145 mmol/L    Comment: RESULTS VERIFIED BY REPEAT TESTING JJB   Potassium 4.6 3.5 - 5.1 mmol/L   Chloride 93 (L) 98 - 111 mmol/L   CO2 22 22 - 32 mmol/L   Glucose, Bld 376 (H) 70 - 99 mg/dL   BUN 54 (H) 6 - 20 mg/dL   Creatinine, Ser 11.65 (H) 0.61 - 1.24 mg/dL   Calcium 7.6 (L) 8.9 - 10.3 mg/dL   Total Protein 7.0 6.5 - 8.1 g/dL   Albumin 3.1 (L) 3.5 - 5.0 g/dL   AST 31 15 - 41 U/L   ALT 14 0 - 44 U/L   Alkaline Phosphatase 86 38 - 126 U/L   Total Bilirubin 0.8 0.3 - 1.2 mg/dL   GFR calc non Af Amer 4 (L) >60 mL/min   GFR calc Af Amer 5 (L) >60 mL/min   Anion gap 16 (H) 5 - 15    Comment: Performed at [facility], [address] [place], [place] [address]  Glucose, capillary     Status: Abnormal   Collection Time: 11/24/18  1:15 PM  Result Value Ref Range   Glucose-Capillary 242 (H) 70 - 99 mg/dL   Comment 1 Notify RN     Current Facility-Administered Medications  Medication Dose Route Frequency Provider Last Rate Last Dose  . acetaminophen (TYLENOL) tablet 650 mg  650 mg Oral Q6H PRN Hillary Bow, MD       Or  . acetaminophen (TYLENOL) suppository 650 mg  650 mg Rectal Q6H PRN Sudini, Srikar, MD      . Chlorhexidine Gluconate Cloth 2 % PADS 6 each  6 each Topical Q0600 Lateef, Munsoor, MD      . guaiFENesin-dextromethorphan (ROBITUSSIN DM) 100-10 MG/5ML syrup 5 mL  5 mL Oral Q4H PRN Hillary Bow, MD   5 mL at 11/23/18 2255  . heparin injection 5,000 Units  5,000 Units Subcutaneous Q12H Rocky Morel, RPH      . insulin aspart (novoLOG) injection 0-9 Units  0-9 Units Subcutaneous TID WC Bettey Costa, MD   7 Units at 11/24/18 0843  . ipratropium-albuterol (DUONEB) 0.5-2.5 (3) MG/3ML nebulizer solution 3 mL  3 mL Nebulization Q4H PRN Mody, Sital, MD      .  metoprolol tartrate (LOPRESSOR) tablet 25 mg  25 mg Oral Daily Hillary Bow, MD   25 mg at 11/24/18 1126  . ondansetron (ZOFRAN) tablet 4 mg  4 mg Oral Q6H PRN Hillary Bow, MD       Or  . ondansetron (ZOFRAN) injection 4 mg  4 mg Intravenous Q6H PRN Hillary Bow, MD      . Derrill Memo ON 11/25/2018] oseltamivir (TAMIFLU) capsule 30 mg  30 mg Oral Q T,Th,Sa-HD Sudini, Srikar, MD      . polyethylene glycol (MIRALAX / GLYCOLAX) packet 17 g  17 g Oral Daily PRN Sudini, Srikar, MD      . predniSONE (DELTASONE) tablet 40 mg  40 mg Oral Q breakfast Arta Silence, MD   40 mg at 11/24/18 0843  . sevelamer carbonate (RENVELA) tablet 1,600 mg  1,600 mg Oral TID WC Hillary Bow, MD  1,600 mg at 11/24/18 0843  . sodium chloride flush (NS) 0.9 % injection 3 mL  3 mL Intravenous Q12H Sudini, Alveta Heimlich, MD   3 mL at 11/24/18 1214  . sucroferric oxyhydroxide (VELPHORO) chewable tablet 500 mg  500 mg Oral 6 X Daily Hillary Bow, MD   500 mg at 11/24/18 4098    Musculoskeletal: Strength & Muscle Tone: did not formally assess Gait & Station: pt lying in hospital bed Patient leans: N/A  Psychiatric Specialty Exam: Physical Exam  Nursing note and vitals reviewed. Constitutional: He is oriented to person, place, and time. No distress.  HENT:  Head: Normocephalic and atraumatic.  Respiratory: Effort normal.  Neurological: He is alert and oriented to person, place, and time.  Psychiatric: Thought content normal.    Review of Systems  Respiratory: Positive for cough.   Cardiovascular: Negative.   Gastrointestinal: Negative.   Genitourinary:       End-stage renal disease  Neurological: Negative.   Psychiatric/Behavioral: Negative for depression, hallucinations, substance abuse and suicidal ideas. The patient is not nervous/anxious.     Blood pressure 135/69, pulse 74, temperature 98.8 F (37.1 C), temperature source Oral, resp. rate (!) 24, height 5\' 6"  (1.676 m), weight 61.6 kg, SpO2 94 %.Body  mass index is 21.9 kg/m.  General Appearance: thin, small male  Eye Contact:  Poor  Speech:  Clear and Coherent  Volume:  Increased  Mood:  Irritable  Affect:  Congruent  Thought Process:  Coherent  Orientation:  Full (Time, Place, and Person)  Thought Content:  Logical  Suicidal Thoughts:  pt denies  Homicidal Thoughts:  pt denies  Memory:  grossly intact, but pt states he doesn't remember what occurred yesterday prior to dialysis  Judgement:  Other:  currently fair  Insight:  currently fair  Psychomotor Activity:  Normal  Concentration:  Concentration: grossly intact and Attention Span: grossly intact  Recall:  grossly fair  Fund of Knowledge:  Fair  Language:  Fair  Akathisia:  No  AIMS (if indicated):     Assets:  Communication Skills Desire for Improvement Resilience Social Support  ADL's:  Intact  Cognition:  WNL  Sleep:        Treatment Plan Summary: Plan Pt is a 58 yo male with end-stage renal disease on hemodialysis and current diagnoses of Influenza B who was admitted for acute encephalopathy.  Pt received hemodialysis yesterday evening and this morning his mentation is back to baseline.  Psychiatry consulted to assess for depression and self-neglect.  Pt denies feeling depressed or anxious.  He denies suicidal or homicidal ideation.  He denies hallucinations, paranoia.  He declines any outpatient psychiatric treatment.  Brief supportive therapy provided during today's assessment, but pt states he does not want to see a therapist as an outpatient.  Pt states he's able to make his own decisions.  At this time, pt does appear to have capacity to make his own decisions regarding consenting to dialysis and refusing psychiatric care.  He is currently not displaying any symptoms of self-harm/suicide and is deemed not to be at imminent risk to self or others.  He denies being depressed or anxious and refuses psychiatric therapeutic services.  Thus, psychiatry will sign off.  I  attempted to contact pt's fiance, but was unable to reach her.    Disposition: No evidence of imminent risk to self or others at present.   Patient does not meet criteria for psychiatric inpatient admission. Supportive therapy provided about ongoing stressors. Patient can  call 911, talk to family or friends, seek outpatient psychiatric care or return to the emergency department/hospital if he has any psychiatric concerns.     Tennis Ship, MD 11/24/2018 1:21 PM

## 2018-11-24 NOTE — Progress Notes (Signed)
[facility]  ROUNDING NOTE   Subjective:  Patient readmitted yesterday with altered mental status. He underwent hemodialysis yesterday given the fact that he declined dialysis on Monday.   Objective:  Vital signs in last 24 hours:  Temp:  [97.7 F (36.5 C)-98.8 F (37.1 C)] 98.8 F (37.1 C) (03/18 0340) Pulse Rate:  [58-86] 74 (03/18 0340) Resp:  [16-24] 24 (03/18 0340) BP: (93-153)/(52-81) 135/69 (03/18 0340) SpO2:  [90 %-100 %] 94 % (03/18 0456) Weight:  [61.6 kg-62.5 kg] 61.6 kg (03/18 0408)  Weight change:  Filed Weights   11/23/18 1823 11/23/18 2215 11/24/18 0408  Weight: 62.5 kg 61.8 kg 61.6 kg    Intake/Output: I/O last 3 completed shifts: In: 557.9 [I.V.:315.4; IV Piggyback:242.6] Out: 1503 [WSFKC:1275]   Intake/Output this shift:  No intake/output data recorded.  Physical Exam: General: No acute distress  Head: Normocephalic, atraumatic. Moist oral mucosal membranes  Eyes: Anicteric  Neck: Supple, trachea midline  Lungs:  Clear to auscultation, normal effort  Heart: S1S2 no rubs  Abdomen:  Soft, nontender, bowel sounds present  Extremities: Trace peripheral edema.  Neurologic: Awake, alert, following commands  Skin: No lesions       Basic Metabolic Panel: Recent Labs  Lab 11/19/18 2038 11/21/18 1455 11/22/18 0419 11/23/18 1021 11/24/18 0607 11/24/18 0811  NA 137 137 137 140  --  131*  K 6.1* 5.0 5.0 5.2*  --  4.6  CL 99 97* 100 102  --  93*  CO2 18* 20* 20* 19*  --  22  GLUCOSE 112* 68* 89 68*  --  376*  BUN 73* 51* 59* 76*  --  54*  CREATININE 12.82* 11.05* 13.49* 16.09*  --  11.65*  CALCIUM 7.8* 8.8* 8.8* 8.5*  --  7.6*  MG  --   --   --   --  2.0  --   PHOS  --   --   --   --  6.3*  --     Liver Function Tests: Recent Labs  Lab 11/19/18 2038 11/21/18 1455 11/23/18 1021 11/24/18 0811  AST 30 37 30 31  ALT 17 16 14 14   ALKPHOS 121 119 92 86  BILITOT 1.9* 0.9 1.1 0.8  PROT 7.3 7.9 7.4 7.0  ALBUMIN 3.8 4.0 3.3*  3.1*   No results for input(s): LIPASE, AMYLASE in the last 168 hours. Recent Labs  Lab 11/24/18 0603  AMMONIA 16    CBC: Recent Labs  Lab 11/19/18 2038 11/21/18 1455 11/23/18 1021 11/24/18 0607  WBC 4.1 4.3 3.4* 1.8*  NEUTROABS  --  3.2 2.2  --   HGB 11.0* 12.9* 12.3* 11.4*  HCT 35.5* 41.6 39.8 35.4*  MCV 98.3 96.5 98.0 94.4  PLT 229 203 183 207    Cardiac Enzymes: Recent Labs  Lab 11/19/18 2038 11/23/18 1021  TROPONINI 0.07* 0.11*    BNP: Invalid input(s): POCBNP  CBG: Recent Labs  Lab 11/23/18 1618 11/23/18 1729 11/23/18 2359 11/24/18 0415 11/24/18 0723  GLUCAP 61* 78 116* 115* 309*    Microbiology: Results for orders placed or performed during the hospital encounter of 11/23/18  Blood Culture (routine x 2)     Status: None (Preliminary result)   Collection Time: 11/23/18 10:27 AM  Result Value Ref Range Status   Specimen Description BLOOD RIGHT ARM  Final   Special Requests   Final    BOTTLES DRAWN AEROBIC AND ANAEROBIC Blood Culture adequate volume   Culture   Final  NO GROWTH < 24 HOURS Performed at [facility], [address] [place], [place] [address]    Report Status PENDING  Incomplete  Blood Culture (routine x 2)     Status: None (Preliminary result)   Collection Time: 11/23/18 10:50 AM  Result Value Ref Range Status   Specimen Description BLOOD BLOOD RIGHT HAND  Final   Special Requests   Final    BOTTLES DRAWN AEROBIC AND ANAEROBIC Blood Culture adequate volume   Culture   Final    NO GROWTH < 24 HOURS Performed at [facility], [address], [place], [place] [address]    Report Status PENDING  Incomplete    Coagulation Studies: Recent Labs    11/21/18 1455  LABPROT 14.0  INR 1.1    Urinalysis: No results for input(s): COLORURINE, LABSPEC, PHURINE, GLUCOSEU, HGBUR, BILIRUBINUR, KETONESUR, PROTEINUR, UROBILINOGEN, NITRITE, LEUKOCYTESUR in the last 72 hours.  Invalid input(s): APPERANCEUR     Imaging: Ct Head Wo Contrast  Result Date: 11/23/2018 CLINICAL DATA:  Altered mental status.  Renal failure EXAM: CT HEAD WITHOUT CONTRAST TECHNIQUE: Contiguous axial images were obtained from the base of the skull through the vertex without intravenous contrast. COMPARISON:  None. FINDINGS: Brain: The ventricles and sulci are upper normal in size for age. There is no intracranial mass, hemorrhage, extra-axial fluid collection, or midline shift. There is slight small vessel disease in the centra semiovale bilaterally. There is evidence of a prior tiny lacunar infarct in the head of the caudate nucleus on the left. No acute appearing infarct is evident. Vascular: There is no appreciable hyperdense vessel. There is calcification in each carotid siphon and distal vertebral artery region. Skull: There are changes of osteomalacia in the calvarium. No lytic or destructive lesions. Sinuses/Orbits: There is mucosal thickening in several ethmoid air cells. Other visualized paranasal sinuses are clear. Orbits appear symmetric bilaterally. Other: Mastoid air cells are clear. IMPRESSION: Slight periventricular small vessel disease. 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 No acute infarct evident. No mass or hemorrhage. Foci of arterial vascular calcification noted. Mucosal thickening noted in several ethmoid air cells. Electronically Signed   By: Lowella Grip III M.D.   On: 11/23/2018 12:20   Dg Chest Port 1 View  Result Date: 11/23/2018 CLINICAL DATA:  Altered mental status.  Recent diagnosis of flu EXAM: PORTABLE CHEST 1 VIEW COMPARISON:  Two days ago FINDINGS: Cardiomegaly. Chronic generalized interstitial coarsening without Kerley line. No acute airspace opacity. Venous stenting and osteopenia. IMPRESSION: No acute finding when compared to priors. Electronically Signed   By: Monte Fantasia M.D.   On: 11/23/2018 10:31     Medications:    . Chlorhexidine Gluconate Cloth  6 each  Topical Q0600  . heparin injection (subcutaneous)  5,000 Units Subcutaneous Q12H  . insulin aspart  0-9 Units Subcutaneous TID WC  . metoprolol tartrate  25 mg Oral Daily  . [START ON 11/25/2018] oseltamivir  30 mg Oral Q T,Th,Sa-HD  . predniSONE  40 mg Oral Q breakfast  . sevelamer carbonate  1,600 mg Oral TID WC  . sodium chloride flush  3 mL Intravenous Q12H  . sucroferric oxyhydroxide  500 mg Oral 6 X Daily   acetaminophen **OR** acetaminophen, guaiFENesin-dextromethorphan, ipratropium-albuterol, ondansetron **OR** ondansetron (ZOFRAN) IV, polyethylene glycol  Assessment/ Plan:  58 y.o. male with ESRD on HD since 2012, HTN, h/o DM now diet controlled, h/o CHF, Cardiac ablation, h/o AVF stent and angioplasty,  Doylene Canning [facility]/MWF/64.5 kg/195  min (3:15)  1.  End-stage renal disease. 2.  Anemia of chronic kidney disease. 3.  Hypertension. 4.  Secondary hyperparathyroidism.  Plan: Patient declined dialysis on Monday when he was previously admitted.  He did undergo emergent dialysis yesterday.  I have recommended that he undergo dialysis again today.  Orders have been prepared.  Hold off on Epogen as hemoglobin currently 11.4.  Recheck serum phosphorus today.   LOS: 1 Benjy Kana 3/18/202010:55 AM

## 2018-11-24 NOTE — Care Management (Signed)
Amanda Morris HD liaison notified of admission and discharge 

## 2018-11-24 NOTE — TOC Transition Note (Signed)
Transition of Care [facility]) - CM/SW Discharge Note   Patient Details  Name: Jeremy [name] MRN: [id number] Date of Birth: 1961-04-22  [facility]) CM/SW Contact:  Shela Leff, LCSW Phone Number: 11/24/2018, 11:25 AM   Clinical Narrative:   Patient discharging today. Patient states he resides with his girlfriend and that he has access to transportation. Patient reports he is able to afford his medications. Patient is agreeable for a primary care physician follow up appointment to be made.           Patient Goals and CMS Choice        Discharge Placement                       Discharge Plan and Services                        Social Determinants of Health (SDOH) Interventions     Readmission Risk Interventions Readmission Risk Prevention Plan 11/24/2018 11/22/2018  Transportation Screening Complete Complete  Medication Review Press photographer) Complete Complete  PCP or Specialist appointment within 3-5 days of discharge Complete Patient refused  [place] or [place] (No Data) (No Data)  SW Recovery Care/Counseling Consult (No Data) (No Data)  Palliative Care Screening Not Applicable Not [place] Not Applicable Not Applicable  Some recent data might be hidden

## 2018-11-24 NOTE — Discharge Summary (Signed)
[place] [facility] [place] NAME: Jeremy [name]    MR#:  [id number]  DATE OF BIRTH:  1961-08-26  DATE OF ADMISSION:  11/23/2018 ADMITTING PHYSICIAN: Hillary Bow, MD  DATE OF DISCHARGE: November 24, 2018  PRIMARY CARE PHYSICIAN: Patient, No Pcp Per    ADMISSION DIAGNOSIS:  Hyperkalemia [E87.5] Elevated troponin [R79.89] Influenza [J11.1] Altered mental status, unspecified altered mental status type [R41.82]  DISCHARGE DIAGNOSIS:  Active Problems:   Acute encephalopathy   SECONDARY DIAGNOSIS:   Past Medical History:  Diagnosis Date  . Bronchitis   . Hypertension   . Renal disorder    kidney failure, dialysis    HOSPITAL COURSE:  58 year old male with end-stage renal disease on hemodialysis not compliant to was discharged from the hospital due to influenza who presented again due to altered mental status.  1.  Acute metabolic encephalopathy in the setting of uremia and not receiving dialysis.  Patient was scheduled for dialysis prior to discharge however he refused dialysis which caused encephalopathy.  His mental status is at baseline. He was eval by psychiatry while in the hospital.  He denies depression or suicidal ideation.  Psychiatry had no further treatment options and said patient is stable to make his own decisions and is not a threat to himself or others.   2.  Influenza A: Patient will continue his treatment for total 5 days of Tamiflu  3.  End-stage renal disease on hemodialysis: Patient is encouraged to keep his scheduled Monday, Wednesday and Friday. He did receive dialysis on the day of admission     DISCHARGE CONDITIONS AND DIET:  Stable Renal diet  CONSULTS OBTAINED:  Treatment Team:  Anthonette Legato, MD  DRUG ALLERGIES:  No Known Allergies  DISCHARGE MEDICATIONS:   Allergies as of 11/24/2018   No Known Allergies     Medication List    TAKE these medications   albuterol 108 (90 Base) MCG/ACT  inhaler Commonly known as:  PROVENTIL HFA;VENTOLIN HFA Inhale 2 puffs into the lungs every 6 (six) hours as needed for wheezing or shortness of breath.   amLODipine 10 MG tablet Commonly known as:  NORVASC Take 10 mg by mouth daily.   gabapentin 100 MG capsule Commonly known as:  NEURONTIN Take 1 capsule (100 mg total) by mouth daily as needed (pain).   guaiFENesin-dextromethorphan 100-10 MG/5ML syrup Commonly known as:  ROBITUSSIN DM Take 5 mLs by mouth every 4 (four) hours as needed for cough.   metoprolol tartrate 25 MG tablet Commonly known as:  LOPRESSOR Take 25 mg by mouth daily.   Multi-Day Plus Iron Tabs Take 1 tablet by mouth daily.   oseltamivir 30 MG capsule Commonly known as:  TAMIFLU Take 1 capsule (30 mg total) by mouth every other day. Only on days after dialysis   sevelamer carbonate 800 MG tablet Commonly known as:  RENVELA Take 1,600 mg by mouth 3 (three) times daily with meals.   Velphoro 500 MG chewable tablet Generic drug:  sucroferric oxyhydroxide Chew 500 mg by mouth 6 (six) times daily.         Today   CHIEF COMPLAINT:  No issues Denies SOB or confusion Denies depression/SI   VITAL SIGNS:  Blood pressure 135/69, pulse 74, temperature 98.8 F (37.1 C), temperature source Oral, resp. rate (!) 24, height 5\' 6"  (1.676 m), weight 61.6 kg, SpO2 94 %.   REVIEW OF SYSTEMS:  Review of Systems  Constitutional: Negative.  Negative for chills, fever and  malaise/fatigue.  HENT: Negative.  Negative for ear discharge, ear pain, hearing loss, nosebleeds and sore throat.   Eyes: Negative.  Negative for blurred vision and pain.  Respiratory: Positive for cough. Negative for hemoptysis, shortness of breath and wheezing.   Cardiovascular: Negative.  Negative for chest pain, palpitations and leg swelling.  Gastrointestinal: Negative.  Negative for abdominal pain, blood in stool, diarrhea, nausea and vomiting.  Genitourinary: Negative.  Negative for  dysuria.  Musculoskeletal: Negative.  Negative for back pain.  Skin: Negative.   Neurological: Negative for dizziness, tremors, speech change, focal weakness, seizures and headaches.  Endo/Heme/Allergies: Negative.  Does not bruise/bleed easily.  Psychiatric/Behavioral: Negative.  Negative for depression, hallucinations and suicidal ideas.     PHYSICAL EXAMINATION:  GENERAL:  58 y.o.-year-old patient lying in the bed with no acute distress.  NECK:  Supple, no jugular venous distention. No thyroid enlargement, no tenderness.  LUNGS: Normal breath sounds bilaterally, no wheezing, rales,rhonchi  No use of accessory muscles of respiration.  CARDIOVASCULAR: S1, S2 normal. No murmurs, rubs, or gallops.  ABDOMEN: Soft, non-tender, non-distended. Bowel sounds present. No organomegaly or mass.  EXTREMITIES: No pedal edema, cyanosis, or clubbing.  PSYCHIATRIC: The patient is alert and oriented x 3.  SKIN: No obvious rash, lesion, or ulcer.   DATA REVIEW:   CBC Recent Labs  Lab 11/24/18 0607  WBC 1.8*  HGB 11.4*  HCT 35.4*  PLT 207    Chemistries  Recent Labs  Lab 11/24/18 0607 11/24/18 0811  NA  --  131*  K  --  4.6  CL  --  93*  CO2  --  22  GLUCOSE  --  376*  BUN  --  54*  CREATININE  --  11.65*  CALCIUM  --  7.6*  MG 2.0  --   AST  --  31  ALT  --  14  ALKPHOS  --  86  BILITOT  --  0.8    Cardiac Enzymes Recent Labs  Lab 11/19/18 2038 11/23/18 1021  TROPONINI 0.07* 0.11*    Microbiology Results  @[email or web address] @  RADIOLOGY:  Ct Head Wo Contrast  Result Date: 11/23/2018 CLINICAL DATA:  Altered mental status.  Renal failure EXAM: CT HEAD WITHOUT CONTRAST TECHNIQUE: Contiguous axial images were obtained from the base of the skull through the vertex without intravenous contrast. COMPARISON:  None. FINDINGS: Brain: The ventricles and sulci are upper normal in size for age. There is no intracranial mass, hemorrhage, extra-axial fluid collection, or midline shift.  There is slight small vessel disease in the centra semiovale bilaterally. There is evidence of a prior tiny lacunar infarct in the head of the caudate nucleus on the left. No acute appearing infarct is evident. Vascular: There is no appreciable hyperdense vessel. There is calcification in each carotid siphon and distal vertebral artery region. Skull: There are changes of osteomalacia in the calvarium. No lytic or destructive lesions. Sinuses/Orbits: There is mucosal thickening in several ethmoid air cells. Other visualized paranasal sinuses are clear. Orbits appear symmetric bilaterally. Other: Mastoid air cells are clear. IMPRESSION: Slight periventricular small vessel disease. 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 No acute infarct evident. No mass or hemorrhage. Foci of arterial vascular calcification noted. Mucosal thickening noted in several ethmoid air cells. Electronically Signed   By: Lowella Grip III M.D.   On: 11/23/2018 12:20   Dg Chest Port 1 View  Result Date: 11/23/2018 CLINICAL DATA:  Altered mental status.  Recent diagnosis  of flu EXAM: PORTABLE CHEST 1 VIEW COMPARISON:  Two days ago FINDINGS: Cardiomegaly. Chronic generalized interstitial coarsening without Kerley line. No acute airspace opacity. Venous stenting and osteopenia. IMPRESSION: No acute finding when compared to priors. Electronically Signed   By: Monte Fantasia M.D.   On: 11/23/2018 10:31      Allergies as of 11/24/2018   No Known Allergies     Medication List    TAKE these medications   albuterol 108 (90 Base) MCG/ACT inhaler Commonly known as:  PROVENTIL HFA;VENTOLIN HFA Inhale 2 puffs into the lungs every 6 (six) hours as needed for wheezing or shortness of breath.   amLODipine 10 MG tablet Commonly known as:  NORVASC Take 10 mg by mouth daily.   gabapentin 100 MG capsule Commonly known as:  NEURONTIN Take 1 capsule (100 mg total) by mouth daily as needed (pain).    guaiFENesin-dextromethorphan 100-10 MG/5ML syrup Commonly known as:  ROBITUSSIN DM Take 5 mLs by mouth every 4 (four) hours as needed for cough.   metoprolol tartrate 25 MG tablet Commonly known as:  LOPRESSOR Take 25 mg by mouth daily.   Multi-Day Plus Iron Tabs Take 1 tablet by mouth daily.   oseltamivir 30 MG capsule Commonly known as:  TAMIFLU Take 1 capsule (30 mg total) by mouth every other day. Only on days after dialysis   sevelamer carbonate 800 MG tablet Commonly known as:  RENVELA Take 1,600 mg by mouth 3 (three) times daily with meals.   Velphoro 500 MG chewable tablet Generic drug:  sucroferric oxyhydroxide Chew 500 mg by mouth 6 (six) times daily.          Management plans discussed with the patient and he is in agreement. Stable for discharge   Patient should follow up with  Nephrology for HD  CODE STATUS:     Code Status Orders  (From admission, onward)         Start     Ordered   11/23/18 1231  Full code  Continuous     11/23/18 1231        Code Status History    Date Active Date Inactive Code Status Order ID Comments User Context   11/21/2018 1857 11/22/2018 2139 Full Code [id number]  SalaryAvel Peace, MD Inpatient   11/20/2018 0122 11/20/2018 1923 Full Code [id number]  Harrie Foreman, MD Inpatient   09/22/2018 1841 09/23/2018 2017 Full Code [id number]  Saundra Shelling, MD Inpatient   05/16/2018 1709 05/19/2018 1433 Full Code [id number]  Vaughan Basta, MD Inpatient      TOTAL TIME TAKING CARE OF THIS PATIENT: 38 minutes.    Note: This dictation was prepared with Dragon dictation along with smaller phrase technology. Any transcriptional errors that result from this process are unintentional.  Bettey Costa M.D on 11/24/2018 at 11:32 AM  Between 7am to 6pm - Pager - [phone] After 6pm go to [email or web address] - password [email or web address] [facility]  Office  [phone]  CC: Primary care physician; Patient, No Pcp  Per

## 2018-11-25 LAB — RPR: RPR Ser Ql: NONREACTIVE

## 2018-11-25 LAB — HIV ANTIBODY (ROUTINE TESTING W REFLEX): HIV Screen 4th Generation wRfx: NONREACTIVE

## 2018-11-25 LAB — VITAMIN D 25 HYDROXY (VIT D DEFICIENCY, FRACTURES): Vit D, 25-Hydroxy: 28.2 ng/mL — ABNORMAL LOW (ref 30.0–100.0)

## 2018-11-25 LAB — PARATHYROID HORMONE, INTACT (NO CA): PTH: 708 pg/mL — ABNORMAL HIGH (ref 15–65)

## 2018-11-26 LAB — CULTURE, BLOOD (ROUTINE X 2)
Culture: NO GROWTH
Culture: NO GROWTH
Special Requests: ADEQUATE

## 2018-11-28 LAB — CULTURE, BLOOD (ROUTINE X 2)
CULTURE: NO GROWTH
Culture: NO GROWTH
Special Requests: ADEQUATE
Special Requests: ADEQUATE

## 2019-01-31 ENCOUNTER — Other Ambulatory Visit: Payer: Self-pay

## 2019-01-31 ENCOUNTER — Encounter: Payer: Self-pay | Admitting: Emergency Medicine

## 2019-01-31 ENCOUNTER — Emergency Department
Admission: EM | Admit: 2019-01-31 | Discharge: 2019-02-01 | Disposition: A | Discharge status: Home / Self Care | Payer: Medicare Other | Attending: Emergency Medicine | Admitting: Emergency Medicine

## 2019-01-31 ENCOUNTER — Emergency Department: Payer: Medicare Other

## 2019-01-31 DIAGNOSIS — R059 Cough, unspecified: Secondary | ICD-10-CM

## 2019-01-31 DIAGNOSIS — E875 Hyperkalemia: Secondary | ICD-10-CM | POA: Diagnosis not present

## 2019-01-31 DIAGNOSIS — F17210 Nicotine dependence, cigarettes, uncomplicated: Secondary | ICD-10-CM | POA: Diagnosis not present

## 2019-01-31 DIAGNOSIS — R05 Cough: Secondary | ICD-10-CM | POA: Diagnosis not present

## 2019-01-31 DIAGNOSIS — Z20828 Contact with and (suspected) exposure to other viral communicable diseases: Secondary | ICD-10-CM | POA: Insufficient documentation

## 2019-01-31 DIAGNOSIS — I10 Essential (primary) hypertension: Secondary | ICD-10-CM | POA: Diagnosis not present

## 2019-01-31 DIAGNOSIS — Z79899 Other long term (current) drug therapy: Secondary | ICD-10-CM | POA: Diagnosis not present

## 2019-01-31 LAB — COMPREHENSIVE METABOLIC PANEL
ALT: 14 U/L (ref 0–44)
AST: 23 U/L (ref 15–41)
Albumin: 3.5 g/dL (ref 3.5–5.0)
Alkaline Phosphatase: 134 U/L — ABNORMAL HIGH (ref 38–126)
Anion gap: 17 — ABNORMAL HIGH (ref 5–15)
BUN: 89 mg/dL — ABNORMAL HIGH (ref 6–20)
CO2: 21 mmol/L — ABNORMAL LOW (ref 22–32)
Calcium: 8.6 mg/dL — ABNORMAL LOW (ref 8.9–10.3)
Chloride: 99 mmol/L (ref 98–111)
Creatinine, Ser: 13.6 mg/dL — ABNORMAL HIGH (ref 0.61–1.24)
GFR calc Af Amer: 4 mL/min — ABNORMAL LOW (ref >60)
GFR calc non Af Amer: 4 mL/min — ABNORMAL LOW (ref >60)
Glucose, Bld: 153 mg/dL — ABNORMAL HIGH (ref 70–99)
Potassium: 5.8 mmol/L — ABNORMAL HIGH (ref 3.5–5.1)
Sodium: 137 mmol/L (ref 135–145)
Total Bilirubin: 0.7 mg/dL (ref 0.3–1.2)
Total Protein: 7.2 g/dL (ref 6.5–8.1)

## 2019-01-31 LAB — CBC
HCT: 30.8 % — ABNORMAL LOW (ref 39.0–52.0)
Hemoglobin: 9.7 g/dL — ABNORMAL LOW (ref 13.0–17.0)
MCH: 30.3 pg (ref 26.0–34.0)
MCHC: 31.5 g/dL (ref 30.0–36.0)
MCV: 96.3 fL (ref 80.0–100.0)
Platelets: 284 10*3/uL (ref 150–400)
RBC: 3.2 MIL/uL — ABNORMAL LOW (ref 4.22–5.81)
RDW: 16.8 % — ABNORMAL HIGH (ref 11.5–15.5)
WBC: 5.7 10*3/uL (ref 4.0–10.5)
nRBC: 0 % (ref 0.0–0.2)

## 2019-01-31 LAB — SARS CORONAVIRUS 2 BY RT PCR (HOSPITAL ORDER, PERFORMED IN ~~LOC~~ HOSPITAL LAB): SARS Coronavirus 2: NEGATIVE

## 2019-01-31 NOTE — ED Notes (Signed)
Lab to come and draw CBC due to clotting.

## 2019-01-31 NOTE — ED Triage Notes (Signed)
Pt c/o dry cough x1 month since pt was diagnosed with influenza here in ED. Pt back to ED tonight due worsening cough. Pt denies fever.

## 2019-01-31 NOTE — ED Provider Notes (Signed)
[facility] Emergency Department Provider Note    ____________________________________________   I have reviewed the triage vital signs and the nursing notes.   HISTORY  Chief Complaint Cough   History limited by: Not Limited   HPI Jeremy [name] is a 58 y.o. male who presents to the emergency department today because of concern for cough. The patient states that he has had the cough since he was diagnosed with the flu a couple of months ago. He states that he came in today because he coughed so hard he passed out. He has an albuterol inhaler that he has used without any significant relief. The patient denies any fevers. States that he had dialysis 2 days ago.    Records reviewed. Per medical record review patient has a history of complaints of cough starting 4 months ago.   Past Medical History:  Diagnosis Date  . Bronchitis   . Hypertension   . Renal disorder    kidney failure, dialysis    Patient Active Problem List   Diagnosis Date Noted  . Acute encephalopathy 11/23/2018  . Influenza B 11/21/2018  . Pulmonary edema 09/22/2018  . Hyperkalemia 05/16/2018    Past Surgical History:  Procedure Laterality Date  . DIALYSIS FISTULA CREATION      Prior to Admission medications   Medication Sig Start Date End Date Taking? Authorizing Provider  albuterol (PROVENTIL HFA;VENTOLIN HFA) 108 (90 Base) MCG/ACT inhaler Inhale 2 puffs into the lungs every 6 (six) hours as needed for wheezing or shortness of breath. 11/19/18   Nena Polio, MD  amLODipine (NORVASC) 10 MG tablet Take 10 mg by mouth daily. 03/29/18   [provider]  gabapentin (NEURONTIN) 100 MG capsule Take 1 capsule (100 mg total) by mouth daily as needed (pain). 11/22/18   Bettey Costa, MD  guaiFENesin-dextromethorphan (ROBITUSSIN DM) 100-10 MG/5ML syrup Take 5 mLs by mouth every 4 (four) hours as needed for cough. 11/20/18   Vaughan Basta, MD  metoprolol tartrate (LOPRESSOR) 25  MG tablet Take 25 mg by mouth daily. 03/29/18   [provider]  Multiple Vitamins-Iron (MULTI-DAY PLUS IRON) TABS Take 1 tablet by mouth daily.    [provider]  oseltamivir (TAMIFLU) 30 MG capsule Take 1 capsule (30 mg total) by mouth every other day. Only on days after dialysis 11/22/18   Bettey Costa, MD  sevelamer carbonate (RENVELA) 800 MG tablet Take 1,600 mg by mouth 3 (three) times daily with meals. 03/02/18   [provider]  VELPHORO 500 MG chewable tablet Chew 500 mg by mouth 6 (six) times daily. 05/27/18   [provider]    Allergies Patient has no known allergies.  Family History  Problem Relation Age of Onset  . Hypertension Mother     Social History Social History   Tobacco Use  . Smoking status: Current Every Day Smoker    Packs/day: 1.00    Types: Cigarettes  . Smokeless tobacco: Never Used  Substance Use Topics  . Alcohol use: Never    Frequency: Never  . Drug use: Yes    Types: Marijuana    Review of Systems Constitutional: No fever/chills Eyes: No visual changes. ENT: No sore throat. Cardiovascular: Denies chest pain. Respiratory: Positive for cough. Gastrointestinal: No abdominal pain.  No nausea, no vomiting.  No diarrhea.   Genitourinary: Negative for dysuria. Musculoskeletal: Negative for back pain. Skin: Negative for rash. Neurological: Negative for headaches, focal weakness or numbness.  ____________________________________________   PHYSICAL EXAM:  VITAL SIGNS: ED Triage Vitals [01/31/19 2153]  Enc Vitals Group     BP (!) 147/70     Pulse Rate 75     Resp 20     Temp 97.8 F (36.6 C)     Temp Source Oral     SpO2 97 %   Constitutional: Alert and oriented.  Eyes: Conjunctivae are normal.  ENT      Head: Normocephalic and atraumatic.      Nose: No congestion/rhinnorhea.      Mouth/Throat: Mucous membranes are moist.      Neck: No stridor. Hematological/Lymphatic/Immunilogical: No cervical  lymphadenopathy. Cardiovascular: Normal rate, regular rhythm.  No murmurs, rubs, or gallops.  Respiratory: Normal respiratory effort without tachypnea nor retractions. Breath sounds are clear and equal bilaterally. No wheezes/rales/rhonchi. Gastrointestinal: Soft and non tender. No rebound. No guarding.  Genitourinary: Deferred Musculoskeletal: Normal range of motion in all extremities. No lower extremity edema. Neurologic:  Normal speech and language. No gross focal neurologic deficits are appreciated.  Skin:  Skin is warm, dry and intact. No rash noted. Psychiatric: Mood and affect are normal. Speech and behavior are normal. Patient exhibits appropriate insight and judgment.  ____________________________________________    LABS (pertinent positives/negatives)  CMP na 137, k 5.8, glu 153, cr 13.60 CBC wbc 5.7, hgb 9.7, plt 284 COVID negative ____________________________________________   EKG  I, Nance Pear, attending physician, personally viewed and interpreted this EKG  EKG Time: 2354 Rate: 73 Rhythm: sinus rhythm Axis: normal Intervals: qtc 491 QRS: narrow ST changes: no st elevation, question peaked t waves v5 v6 Impression: abnormal ekg, compared to previous patient with similar t wave morphology in v5 v6   ____________________________________________    RADIOLOGY  CXR Bilateral hazy opacities  ____________________________________________   PROCEDURES  Procedures  ____________________________________________   INITIAL IMPRESSION / ASSESSMENT AND PLAN / ED COURSE  Pertinent labs & imaging results that were available during my care of the patient were reviewed by me and considered in my medical decision making (see chart for details).   Patient presented to the emergency department today because of concerns for cough.  This cough has been present for a number of months.  It sounds like today he coughed enough that he passed out.  He states that he has  passed out from the cough before.  On exam patient in no acute distress.  Chest x-ray does show some bilateral opacities.  At this point I do think it is likely edema.  I have lower suspicion for infection given lack of fever or leukocytosis.  Patient does have history of end-stage renal disease and is on dialysis.  Has dialysis scheduled for 5:45 AM.  Potassium was located at 5.8.  Concern for possibly some peaked T waves although it appears patient has had similar morphology in previous EKGs.  Discussed with Dr. Holley Raring with nephrology. Recommended lokelma dose in ED. Will discharge to follow up with dialysis this morning.   _________________________________________   FINAL CLINICAL IMPRESSION(S) / ED DIAGNOSES  Final diagnoses:  Cough  Hyperkalemia     Note: This dictation was prepared with Dragon dictation. Any transcriptional errors that result from this process are unintentional     Nance Pear, MD 02/01/19 [id number]

## 2019-01-31 NOTE — ED Notes (Signed)
Pt to the er for worsening cough. Pt is laying in bed with legs crossed. No cough heard during assessment. Pt in no obvious distress. Pt is calm and cooperative.

## 2019-02-01 DIAGNOSIS — R05 Cough: Secondary | ICD-10-CM | POA: Diagnosis not present

## 2019-02-01 MED ORDER — SODIUM ZIRCONIUM CYCLOSILICATE 10 G PO PACK
10.0000 g | PACK | Freq: Once | ORAL | Status: AC
  Administered 2019-02-01: 10 g via ORAL
  Filled 2019-02-01: qty 1

## 2019-02-01 NOTE — Discharge Instructions (Signed)
As we discussed it is very important that you go to your scheduled dialysis this morning.

## 2019-02-01 NOTE — ED Notes (Signed)
Patient AAOx4. NAD. Verbalized dialysis follow up understanding.

## 2019-02-03 ENCOUNTER — Other Ambulatory Visit: Payer: Self-pay | Admitting: Specialist

## 2019-02-03 DIAGNOSIS — J849 Interstitial pulmonary disease, unspecified: Secondary | ICD-10-CM

## 2019-02-03 DIAGNOSIS — R0602 Shortness of breath: Secondary | ICD-10-CM

## 2019-02-09 ENCOUNTER — Other Ambulatory Visit: Payer: Self-pay | Admitting: Specialist

## 2019-02-09 ENCOUNTER — Other Ambulatory Visit: Payer: Self-pay

## 2019-02-09 ENCOUNTER — Ambulatory Visit
Admission: RE | Admit: 2019-02-09 | Discharge: 2019-02-09 | Disposition: A | Discharge status: Home / Self Care | Payer: Medicare Other | Source: Ambulatory Visit | Attending: Specialist | Admitting: Specialist

## 2019-02-09 DIAGNOSIS — J849 Interstitial pulmonary disease, unspecified: Secondary | ICD-10-CM | POA: Insufficient documentation

## 2019-02-09 DIAGNOSIS — R0602 Shortness of breath: Secondary | ICD-10-CM | POA: Diagnosis present

## 2019-02-14 ENCOUNTER — Inpatient Hospital Stay
Admission: EM | Admit: 2019-02-14 | Discharge: 2019-02-15 | DRG: 291 | Discharge status: Against Medical Advice | Payer: Medicare Other | Attending: Family Medicine | Admitting: Family Medicine

## 2019-02-14 ENCOUNTER — Inpatient Hospital Stay: Payer: Medicare Other

## 2019-02-14 ENCOUNTER — Encounter: Payer: Self-pay | Admitting: Emergency Medicine

## 2019-02-14 ENCOUNTER — Emergency Department: Payer: Medicare Other

## 2019-02-14 ENCOUNTER — Other Ambulatory Visit: Payer: Self-pay

## 2019-02-14 DIAGNOSIS — Z20828 Contact with and (suspected) exposure to other viral communicable diseases: Secondary | ICD-10-CM | POA: Diagnosis present

## 2019-02-14 DIAGNOSIS — J449 Chronic obstructive pulmonary disease, unspecified: Secondary | ICD-10-CM | POA: Diagnosis present

## 2019-02-14 DIAGNOSIS — N186 End stage renal disease: Secondary | ICD-10-CM | POA: Diagnosis present

## 2019-02-14 DIAGNOSIS — I509 Heart failure, unspecified: Secondary | ICD-10-CM

## 2019-02-14 DIAGNOSIS — I248 Other forms of acute ischemic heart disease: Secondary | ICD-10-CM | POA: Diagnosis present

## 2019-02-14 DIAGNOSIS — R55 Syncope and collapse: Secondary | ICD-10-CM | POA: Diagnosis present

## 2019-02-14 DIAGNOSIS — Z992 Dependence on renal dialysis: Secondary | ICD-10-CM | POA: Diagnosis not present

## 2019-02-14 DIAGNOSIS — Z8249 Family history of ischemic heart disease and other diseases of the circulatory system: Secondary | ICD-10-CM

## 2019-02-14 DIAGNOSIS — Z79899 Other long term (current) drug therapy: Secondary | ICD-10-CM

## 2019-02-14 DIAGNOSIS — J811 Chronic pulmonary edema: Secondary | ICD-10-CM | POA: Diagnosis present

## 2019-02-14 DIAGNOSIS — E876 Hypokalemia: Secondary | ICD-10-CM | POA: Diagnosis present

## 2019-02-14 DIAGNOSIS — I132 Hypertensive heart and chronic kidney disease with heart failure and with stage 5 chronic kidney disease, or end stage renal disease: Principal | ICD-10-CM | POA: Diagnosis present

## 2019-02-14 DIAGNOSIS — F17210 Nicotine dependence, cigarettes, uncomplicated: Secondary | ICD-10-CM | POA: Diagnosis present

## 2019-02-14 DIAGNOSIS — Z5329 Procedure and treatment not carried out because of patient's decision for other reasons: Secondary | ICD-10-CM | POA: Diagnosis present

## 2019-02-14 DIAGNOSIS — I5021 Acute systolic (congestive) heart failure: Secondary | ICD-10-CM | POA: Diagnosis present

## 2019-02-14 DIAGNOSIS — R05 Cough: Secondary | ICD-10-CM | POA: Diagnosis not present

## 2019-02-14 DIAGNOSIS — I120 Hypertensive chronic kidney disease with stage 5 chronic kidney disease or end stage renal disease: Secondary | ICD-10-CM | POA: Diagnosis not present

## 2019-02-14 DIAGNOSIS — M791 Myalgia, unspecified site: Secondary | ICD-10-CM | POA: Diagnosis not present

## 2019-02-14 LAB — CBC
HCT: 28.9 % — ABNORMAL LOW (ref 39.0–52.0)
Hemoglobin: 9.3 g/dL — ABNORMAL LOW (ref 13.0–17.0)
MCH: 30.1 pg (ref 26.0–34.0)
MCHC: 32.2 g/dL (ref 30.0–36.0)
MCV: 93.5 fL (ref 80.0–100.0)
Platelets: 279 10*3/uL (ref 150–400)
RBC: 3.09 MIL/uL — ABNORMAL LOW (ref 4.22–5.81)
RDW: 16.5 % — ABNORMAL HIGH (ref 11.5–15.5)
WBC: 5.6 10*3/uL (ref 4.0–10.5)
nRBC: 0 % (ref 0.0–0.2)

## 2019-02-14 LAB — BASIC METABOLIC PANEL
Anion gap: 17 — ABNORMAL HIGH (ref 5–15)
BUN: 59 mg/dL — ABNORMAL HIGH (ref 6–20)
CO2: 23 mmol/L (ref 22–32)
Calcium: 8.6 mg/dL — ABNORMAL LOW (ref 8.9–10.3)
Chloride: 97 mmol/L — ABNORMAL LOW (ref 98–111)
Creatinine, Ser: 9.22 mg/dL — ABNORMAL HIGH (ref 0.61–1.24)
GFR calc Af Amer: 7 mL/min — ABNORMAL LOW (ref >60)
GFR calc non Af Amer: 6 mL/min — ABNORMAL LOW (ref >60)
Glucose, Bld: 130 mg/dL — ABNORMAL HIGH (ref 70–99)
Potassium: 3.1 mmol/L — ABNORMAL LOW (ref 3.5–5.1)
Sodium: 137 mmol/L (ref 135–145)

## 2019-02-14 LAB — SARS CORONAVIRUS 2 BY RT PCR (HOSPITAL ORDER, PERFORMED IN ~~LOC~~ HOSPITAL LAB): SARS Coronavirus 2: NEGATIVE

## 2019-02-14 LAB — BRAIN NATRIURETIC PEPTIDE: B Natriuretic Peptide: 4218 pg/mL — ABNORMAL HIGH (ref 0.0–100.0)

## 2019-02-14 LAB — TSH: TSH: 0.697 u[IU]/mL (ref 0.350–4.500)

## 2019-02-14 LAB — TROPONIN I: Troponin I: 0.08 ng/mL (ref <0.03)

## 2019-02-14 MED ORDER — SODIUM CHLORIDE 0.9% FLUSH: 3.0000 mL | Freq: Once | INTRAVENOUS | Status: DC

## 2019-02-14 MED ORDER — HEPARIN SODIUM (PORCINE) 5000 UNIT/ML IJ SOLN
5000.0000 [IU] | Freq: Three times a day (TID) | INTRAMUSCULAR | Status: DC
  Filled 2019-02-14: qty 1

## 2019-02-14 MED ORDER — METOPROLOL TARTRATE 25 MG PO TABS: 25.0000 mg | ORAL_TABLET | Freq: Every day | ORAL | Status: DC

## 2019-02-14 MED ORDER — SEVELAMER CARBONATE 800 MG PO TABS: 1600.0000 mg | ORAL_TABLET | Freq: Three times a day (TID) | ORAL | Status: DC

## 2019-02-14 MED ORDER — SUCROFERRIC OXYHYDROXIDE 500 MG PO CHEW
500.0000 mg | CHEWABLE_TABLET | Freq: Every day | ORAL | Status: DC
  Administered 2019-02-15: 500 mg via ORAL
  Filled 2019-02-14 (×3): qty 1

## 2019-02-14 MED ORDER — POTASSIUM CHLORIDE 20 MEQ/15ML (10%) PO SOLN
10.0000 meq | Freq: Once | ORAL | Status: AC
  Administered 2019-02-14: 10 meq via ORAL
  Filled 2019-02-14 (×2): qty 15

## 2019-02-14 MED ORDER — ACETAMINOPHEN 325 MG PO TABS
650.0000 mg | ORAL_TABLET | ORAL | Status: DC | PRN
  Administered 2019-02-14: 650 mg via ORAL
  Filled 2019-02-14: qty 2

## 2019-02-14 MED ORDER — TAB-A-VITE/IRON PO TABS
1.0000 | ORAL_TABLET | Freq: Every day | ORAL | Status: DC
  Filled 2019-02-14: qty 1

## 2019-02-14 MED ORDER — AMLODIPINE BESYLATE 10 MG PO TABS: 10.0000 mg | ORAL_TABLET | Freq: Every day | ORAL | Status: DC

## 2019-02-14 MED ORDER — UMECLIDINIUM-VILANTEROL 62.5-25 MCG/INH IN AEPB
1.0000 | INHALATION_SPRAY | Freq: Every day | RESPIRATORY_TRACT | Status: DC
  Filled 2019-02-14: qty 14

## 2019-02-14 MED ORDER — SODIUM CHLORIDE 0.9 % IV SOLN: 250.0000 mL | INTRAVENOUS | Status: DC | PRN

## 2019-02-14 MED ORDER — CINACALCET HCL 30 MG PO TABS: 30.0000 mg | ORAL_TABLET | ORAL | Status: DC

## 2019-02-14 MED ORDER — ONDANSETRON HCL 4 MG/2ML IJ SOLN: 4.0000 mg | Freq: Four times a day (QID) | INTRAMUSCULAR | Status: DC | PRN

## 2019-02-14 MED ORDER — SODIUM CHLORIDE 0.9% FLUSH
3.0000 mL | Freq: Two times a day (BID) | INTRAVENOUS | Status: DC
  Administered 2019-02-14: 23:00:00 3 mL via INTRAVENOUS

## 2019-02-14 MED ORDER — CALCIUM ACETATE (PHOS BINDER) 667 MG PO CAPS: 667.0000 mg | ORAL_CAPSULE | Freq: Three times a day (TID) | ORAL | Status: DC

## 2019-02-14 MED ORDER — SODIUM CHLORIDE 0.9% FLUSH: 3.0000 mL | INTRAVENOUS | Status: DC | PRN

## 2019-02-14 NOTE — ED Notes (Signed)
Multiple unsuccessful attempts to establish IV access. IDP notified. IV team consult ordered. 

## 2019-02-14 NOTE — H&P (Signed)
[facility] [place] NAME: Jeremy [name]    MR#:  [id number]  DATE OF BIRTH:  May 22, 1961  DATE OF ADMISSION:  02/14/2019  PRIMARY CARE PHYSICIAN: Patient, No Pcp Per   REQUESTING/REFERRING PHYSICIAN: Duffy Bruce, MD  CHIEF COMPLAINT:   Chief Complaint  Patient presents with  . Loss of Consciousness    HISTORY OF PRESENT ILLNESS:  Jeremy [name]  is a 58 y.o. male with a known history of end-stage renal disease on hemodialysis followed by Dr. Candiss Norse.  He also has a history of hypertension and COPD.  He presented to the emergency room with recurrent syncope and collapse.  He was seen in the emergency room on 01/31/2019 diagnosed with bronchitis and upper respiratory infection at that time with idea that cough related to bronchitis had resulted in syncopal episode at that time.  However, since that time, patient has experienced for additional episodes of syncope with collapse.  Episodes are usually preceded by severe coughing episode.  He has experienced nausea and vomiting related to the severe cough followed by syncope as well.  He was seen by Dr. Raul Del on 02/02/2019 with echocardiogram completed demonstrating decreased ejection fraction to 30%.  At that time he was started on Anoro and as needed albuterol with Mucinex.  He notes no improvement in cough since then.  He denies chest pain.  He denies fevers or chills.  He denies hematemesis, hematochezia, or melena.  He denies abdominal pain.  As previously stated, patient has a history of end-stage renal disease and is on hemodialysis Monday, Wednesday, Friday.  He had full session of dialysis earlier today.  BUN on arrival is 59 with creatinine 9.22.  Potassium is 3.1.  Troponin is 0.08.  BNP is also elevated 4218.  Patient has CT chest on 02/09/2019 with mild pulmonary edema demonstrated at that time.  This x-ray today shows pulmonary vascular congestion with persistent interstitial infiltrates.  He has been  admitted to the hospitalist service for further management.    PAST MEDICAL HISTORY:   Past Medical History:  Diagnosis Date  . Bronchitis   . Hypertension   . Renal disorder    kidney failure, dialysis    PAST SURGICAL HISTORY:   Past Surgical History:  Procedure Laterality Date  . DIALYSIS FISTULA CREATION      SOCIAL HISTORY:   Social History   Tobacco Use  . Smoking status: Current Every Day Smoker    Packs/day: 1.00    Types: Cigarettes  . Smokeless tobacco: Never Used  Substance Use Topics  . Alcohol use: Never    Frequency: Never    FAMILY HISTORY:   Family History  Problem Relation Age of Onset  . Hypertension Mother     DRUG ALLERGIES:  No Known Allergies  REVIEW OF SYSTEMS:   Review of Systems  Constitutional: Negative for chills and fever.  HENT: Negative for congestion, sinus pain and sore throat.   Eyes: Negative for blurred vision, double vision and pain.  Respiratory: Positive for cough, sputum production (yellow) and shortness of breath. Negative for hemoptysis and wheezing.   Cardiovascular: Positive for palpitations. Negative for chest pain and leg swelling.  Gastrointestinal: Positive for nausea and vomiting. Negative for abdominal pain, blood in stool, constipation, diarrhea, heartburn and melena.  Genitourinary: Negative for dysuria, flank pain, frequency, hematuria and urgency.  Musculoskeletal: Positive for falls (Syncopal episodes with collapse). Negative for back pain, joint pain, myalgias and neck pain.  Skin:  Negative for itching and rash.  Neurological: Positive for loss of consciousness. Negative for dizziness, tingling, tremors, focal weakness, seizures, weakness and headaches.  Psychiatric/Behavioral: Negative.       MEDICATIONS AT HOME:   Prior to Admission medications   Medication Sig Start Date End Date Taking? Authorizing Provider  amLODipine (NORVASC) 10 MG tablet Take 10 mg by mouth daily. 03/29/18  Yes [provider]  calcium acetate (PHOSLO) 667 MG capsule Take 667 mg by mouth 3 (three) times daily. 10/20/13  Yes [provider]  cinacalcet (SENSIPAR) 30 MG tablet Take 30 mg by mouth 3 (three) times a week. 09/04/15  Yes [provider]  gabapentin (NEURONTIN) 100 MG capsule Take 1 capsule (100 mg total) by mouth daily as needed (pain). 11/22/18  Yes Mody, Ulice Bold, MD  metoprolol tartrate (LOPRESSOR) 25 MG tablet Take 25 mg by mouth daily. 03/29/18  Yes [provider]  Multiple Vitamins-Iron (MULTI-DAY PLUS IRON) TABS Take 1 tablet by mouth daily.   Yes [provider]  sevelamer carbonate (RENVELA) 800 MG tablet Take 1,600 mg by mouth 3 (three) times daily with meals. 03/02/18  Yes [provider]  umeclidinium-vilanterol (ANORO ELLIPTA) 62.5-25 MCG/INH AEPB Inhale 1 puff into the lungs daily. 02/02/19  Yes [provider]  VELPHORO 500 MG chewable tablet Chew 500 mg by mouth 6 (six) times daily. 05/27/18  Yes [provider]  albuterol (PROVENTIL HFA;VENTOLIN HFA) 108 (90 Base) MCG/ACT inhaler Inhale 2 puffs into the lungs every 6 (six) hours as needed for wheezing or shortness of breath. 11/19/18   Nena Polio, MD  guaiFENesin-dextromethorphan (ROBITUSSIN DM) 100-10 MG/5ML syrup Take 5 mLs by mouth every 4 (four) hours as needed for cough. Patient not taking: Reported on 02/14/2019 11/20/18   Vaughan Basta, MD  oseltamivir (TAMIFLU) 30 MG capsule Take 1 capsule (30 mg total) by mouth every other day. Only on days after dialysis Patient not taking: Reported on 02/14/2019 11/22/18   Bettey Costa, MD      VITAL SIGNS:  Blood pressure (!) 196/92, pulse 78, temperature 98.2 F (36.8 C), temperature source Oral, resp. rate 15, height 5\' 6"  (1.676 m), weight 67.1 kg, SpO2 93 %.  PHYSICAL EXAMINATION:  Physical Exam Vitals signs and nursing note reviewed.  Constitutional:      General: He is not in acute distress.    Appearance:  Normal appearance. He is not ill-appearing.  HENT:     Head: Normocephalic.     Right Ear: External ear normal.     Left Ear: External ear normal.     Nose: Nose normal. No congestion or rhinorrhea.     Mouth/Throat:     Mouth: Mucous membranes are moist.     Pharynx: Oropharynx is clear.  Eyes:     General: No scleral icterus.    Extraocular Movements: Extraocular movements intact.     Conjunctiva/sclera: Conjunctivae normal.     Pupils: Pupils are equal, round, and reactive to light.  Neck:     Musculoskeletal: Normal range of motion and neck supple. No muscular tenderness.  Cardiovascular:     Rate and Rhythm: Normal rate and regular rhythm.     Pulses: Normal pulses.     Heart sounds: Normal heart sounds.  Pulmonary:     Effort: Pulmonary effort is normal. No respiratory distress.     Breath sounds: Normal breath sounds. No wheezing, rhonchi or rales.  Chest:     Chest wall: No tenderness.  Abdominal:  General: There is no distension.     Palpations: Abdomen is soft. There is no mass.     Tenderness: There is no abdominal tenderness. There is no right CVA tenderness or left CVA tenderness.  Musculoskeletal: Normal range of motion.     Right lower leg: No edema.     Left lower leg: No edema.  Skin:    General: Skin is warm and dry.     Capillary Refill: Capillary refill takes less than 2 seconds.     Coloration: Skin is not jaundiced.     Findings: No erythema or rash.     Comments: Left upper arm AV fistula with positive thrill  Neurological:     General: No focal deficit present.     Mental Status: He is alert and oriented to person, place, and time.     Cranial Nerves: No cranial nerve deficit.     Sensory: No sensory deficit.     Motor: No weakness.  Psychiatric:        Mood and Affect: Mood normal.        Behavior: Behavior normal.     LABORATORY PANEL:   CBC Recent Labs  Lab 02/14/19 1342  WBC 5.6  HGB 9.3*  HCT 28.9*  PLT 279    ------------------------------------------------------------------------------------------------------------------  Chemistries  Recent Labs  Lab 02/14/19 1342  NA 137  K 3.1*  CL 97*  CO2 23  GLUCOSE 130*  BUN 59*  CREATININE 9.22*  CALCIUM 8.6*   ------------------------------------------------------------------------------------------------------------------  Cardiac Enzymes Recent Labs  Lab 02/14/19 1342  TROPONINI 0.08*   ------------------------------------------------------------------------------------------------------------------  RADIOLOGY:  Dg Chest 2 View  Result Date: 02/14/2019 CLINICAL DATA:  Persistent cough EXAM: CHEST - 2 VIEW COMPARISON:  01/31/2019 FINDINGS: Enlargement of cardiac silhouette with pulmonary vascular congestion. Mild chronic elevation of RIGHT diaphragm. Persistent interstitial opacities which may represent pulmonary edema or atypical infection. Subsegmental atelectasis RIGHT base. No pleural effusion or pneumothorax. Vascular stent at LEFT subclavian/axillary region. IMPRESSION: Enlargement of cardiac silhouette with pulmonary vascular congestion. Persistent interstitial infiltrates question pulmonary edema versus atypical infection. Minimal subsegmental atelectasis RIGHT base. Electronically Signed   By: Lavonia Dana M.D.   On: 02/14/2019 15:42      IMPRESSION AND PLAN:   1. acute systolic CHF - With BNP 1610 and echo on 02/02/2019 with ejection fraction 30% - With end-stage renal disease on hemodialysis.  Felt to be likely hypervolemia in the setting of end-stage renal disease and may require recurrent dialysis.  ED physician spoke with nephrology on-call and patient has been admitted for possible recurrent dialysis. - Cardiology Dr. Percival Spanish, consulted for acute systolic CHF. - We will continue to trend troponin levels with troponin of 0.08 likely secondary to demand ischemia. - Beta-blocker has been restarted.  2.  Syncope with collapse  - Usually occurs secondary to severe intractable cough likely secondary to hypervolemia and acute CHF - Nephrology consulted for possible repeat dialysis -Cardiology consulted for further evaluation of acute CHF - Fall precautions - CT brain negative  3.  Hypokalemia - 20 mEq potassium p.o. - Telemetry monitoring - We will monitor electrolytes closely as well as BUN and creatinine with End stage renal disease  4.  Elevated troponin --Continue to trend troponin levels - Likely secondary to demand ischemia - Telemetry monitoring - Cardiology consulted for further evaluation and recommendations as well  5.  End-stage renal disease on hemodialysis --Nephrology consulted for possible repeat dialysis. --Hemodialysis on Monday, Wednesday, Friday - Repeat CBC and BMP  in the a.m.  DVT and PPI prophylaxis have been initiated    All the records are reviewed and case discussed with ED provider. The plan of care was discussed in details with the patient (and family). I answered all questions. The patient agreed to proceed with the above mentioned plan. Further management will depend upon hospital course.   CODE STATUS: Full code  TOTAL TIME TAKING CARE OF THIS PATIENT: 34minutes.    Jeremy [name] CRNPon 02/14/2019 at 9:37 PM  Pager - [phone]  After 6pm go to [email or web address] - Proofreader  [facility]  Office  [phone]  CC: Primary care physician; Patient, No Pcp Per   Note: This dictation was prepared with Dragon dictation along with smaller phrase technology. Any transcriptional errors that result from this process are unintentional.

## 2019-02-14 NOTE — ED Triage Notes (Signed)
Says he has been passing out for 2 weeks total of 4 times.  Happens after straining.

## 2019-02-14 NOTE — ED Notes (Addendum)
ED TO INPATIENT HANDOFF REPORT  ED Nurse Name and Phone #:  Gershon Mussel RN   [phone]  S Name/Age/Gender Jeremy [name] 58 y.o. male Room/Bed: ED15A/ED15A  Code Status   Code Status: Full Code  Home/SNF/Other Home Patient oriented to: self, place, time and situation Is this baseline? Yes   Triage Complete: Triage complete  Chief Complaint syncopal episode  Triage Note Says he has been passing out for 2 weeks total of 4 times.  Happens after straining.     Allergies No Known Allergies  Level of Care/Admitting Diagnosis ED Disposition    ED Disposition Condition [facility] Area: [place] [[id number]]  Level of Care: Med-Surg [16]  Covid Evaluation: Confirmed COVID Negative  Diagnosis: Acute CHF (congestive heart failure) [facility]) [[id number]]  Admitting Physician: Christel Mormon [[id number]]  Attending Physician: Christel Mormon [[id number]]  Estimated length of stay: past midnight tomorrow  Certification:: I certify this patient will need inpatient services for at least 2 midnights  PT Class (Do Not Modify): Inpatient [101]  PT Acc Code (Do Not Modify): Private [1]       B Medical/Surgery History Past Medical History:  Diagnosis Date  . Bronchitis   . Hypertension   . Renal disorder    kidney failure, dialysis   Past Surgical History:  Procedure Laterality Date  . DIALYSIS FISTULA CREATION       A IV Location/Drains/Wounds Patient Lines/Drains/Airways Status   Active Line/Drains/Airways    Name:   Placement date:   Placement time:   Site:   Days:   Peripheral IV 02/14/19 Right Hand   02/14/19    1905    Hand   less than 1   Fistula / Graft Left Upper arm   05/17/18    -    Upper arm   273          Intake/Output Last 24 hours No intake or output data in the 24 hours ending 02/14/19 2155  Labs/Imaging Results for orders placed or performed during the hospital encounter of 02/14/19 (from the past 48 hour(s))  Basic metabolic panel     Status:  Abnormal   Collection Time: 02/14/19  1:42 PM  Result Value Ref Range   Sodium 137 135 - 145 mmol/L   Potassium 3.1 (L) 3.5 - 5.1 mmol/L   Chloride 97 (L) 98 - 111 mmol/L   CO2 23 22 - 32 mmol/L   Glucose, Bld 130 (H) 70 - 99 mg/dL   BUN 59 (H) 6 - 20 mg/dL   Creatinine, Ser 9.22 (H) 0.61 - 1.24 mg/dL   Calcium 8.6 (L) 8.9 - 10.3 mg/dL   GFR calc non Af Amer 6 (L) >60 mL/min   GFR calc Af Amer 7 (L) >60 mL/min   Anion gap 17 (H) 5 - 15    Comment: Performed at [facility], [address] [place], [place] [address]  CBC     Status: Abnormal   Collection Time: 02/14/19  1:42 PM  Result Value Ref Range   WBC 5.6 4.0 - 10.5 K/uL   RBC 3.09 (L) 4.22 - 5.81 MIL/uL   Hemoglobin 9.3 (L) 13.0 - 17.0 g/dL   HCT 28.9 (L) 39.0 - 52.0 %   MCV 93.5 80.0 - 100.0 fL   MCH 30.1 26.0 - 34.0 pg   MCHC 32.2 30.0 - 36.0 g/dL   RDW 16.5 (H) 11.5 - 15.5 %   Platelets 279 150 - 400  K/uL   nRBC 0.0 0.0 - 0.2 %    Comment: Performed at [facility], [address] [place], [place] [address]  Brain natriuretic peptide     Status: Abnormal   Collection Time: 02/14/19  1:42 PM  Result Value Ref Range   B Natriuretic Peptide 4,218.0 (H) 0.0 - 100.0 pg/mL    Comment: Performed at [facility], [address] [place], [place] [address]  Troponin I - ONCE - STAT     Status: Abnormal   Collection Time: 02/14/19  1:42 PM  Result Value Ref Range   Troponin I 0.08 (HH) <0.03 ng/mL    Comment: CRITICAL RESULT CALLED TO, READ BACK BY AND VERIFIED WITH TOM Chrisette Man @1646  02/14/19 AKT Performed at [facility], [address], [place], [place] [address]   SARS Coronavirus 2 (CEPHEID - Performed in [facility] lab), Hosp Order     Status: None   Collection Time: 02/14/19  7:26 PM  Result Value Ref Range   SARS Coronavirus 2 NEGATIVE NEGATIVE    Comment: (NOTE) If result is NEGATIVE SARS-CoV-2 target nucleic acids are NOT DETECTED. The SARS-CoV-2 RNA is generally  detectable in upper and lower  respiratory specimens during the acute phase of infection. The lowest  concentration of SARS-CoV-2 viral copies this assay can detect is 250  copies / mL. A negative result does not preclude SARS-CoV-2 infection  and should not be used as the sole basis for treatment or other  patient management decisions.  A negative result may occur with  improper specimen collection / handling, submission of specimen other  than nasopharyngeal swab, presence of viral mutation(s) within the  areas targeted by this assay, and inadequate number of viral copies  (<250 copies / mL). A negative result must be combined with clinical  observations, patient history, and epidemiological information. If result is POSITIVE SARS-CoV-2 target nucleic acids are DETECTED. The SARS-CoV-2 RNA is generally detectable in upper and lower  respiratory specimens dur ing the acute phase of infection.  Positive  results are indicative of active infection with SARS-CoV-2.  Clinical  correlation with patient history and other diagnostic information is  necessary to determine patient infection status.  Positive results do  not rule out bacterial infection or co-infection with other viruses. If result is PRESUMPTIVE POSTIVE SARS-CoV-2 nucleic acids MAY BE PRESENT.   A presumptive positive result was obtained on the submitted specimen  and confirmed on repeat testing.  While 2019 novel coronavirus  (SARS-CoV-2) nucleic acids may be present in the submitted sample  additional confirmatory testing may be necessary for epidemiological  and / or clinical management purposes  to differentiate between  SARS-CoV-2 and other Sarbecovirus currently known to infect humans.  If clinically indicated additional testing with an alternate test  methodology [phone]) is advised. The SARS-CoV-2 RNA is generally  detectable in upper and lower respiratory sp ecimens during the acute  phase of infection. The  expected result is Negative. Fact Sheet for Patients:  StrictlyIdeas.no Fact Sheet for Healthcare Providers: BankingDealers.co.za This test is not yet approved or cleared by the [place] FDA and has been authorized for detection and/or diagnosis of SARS-CoV-2 by FDA under an Emergency Use Authorization (EUA).  This EUA will remain in effect (meaning this test can be used) for the duration of the COVID-19 declaration under Section 564(b)(1) of the Act, 21 U.S.C. section 360bbb-3(b)(1), unless the authorization is terminated or revoked sooner. Performed at [facility], [place]  [address], [place], [place] [address]    Dg Chest 2 View  Result Date: 02/14/2019 CLINICAL DATA:  Persistent cough EXAM: CHEST - 2 VIEW COMPARISON:  01/31/2019 FINDINGS: Enlargement of cardiac silhouette with pulmonary vascular congestion. Mild chronic elevation of RIGHT diaphragm. Persistent interstitial opacities which may represent pulmonary edema or atypical infection. Subsegmental atelectasis RIGHT base. No pleural effusion or pneumothorax. Vascular stent at LEFT subclavian/axillary region. IMPRESSION: Enlargement of cardiac silhouette with pulmonary vascular congestion. Persistent interstitial infiltrates question pulmonary edema versus atypical infection. Minimal subsegmental atelectasis RIGHT base. Electronically Signed   By: Lavonia Dana M.D.   On: 02/14/2019 15:42    Pending Labs Unresulted Labs (From admission, onward)    Start     Ordered   02/15/19 1583  Basic metabolic panel  Daily,   STAT     02/14/19 2128   02/14/19 2122  TSH  Once,   STAT     02/14/19 2128   02/14/19 1330  Urinalysis, Complete w Microscopic  ONCE - STAT,   STAT     02/14/19 1330          Vitals/Pain Today's Vitals   02/14/19 1800 02/14/19 1930 02/14/19 2000 02/14/19 2030  BP: (!) 158/83 (!) 186/94 (!) 195/131 (!) 196/92  Pulse: 77 80 78 78  Resp: 19 20 (!) 23 15   Temp:      TempSrc:      SpO2: 94% 97% 90% 93%  Weight:      Height:      PainSc:        Isolation Precautions No active isolations  Medications Medications  sodium chloride flush (NS) 0.9 % injection 3 mL (3 mLs Intravenous Not Given 02/14/19 2144)  sodium chloride flush (NS) 0.9 % injection 3 mL (has no administration in time range)  sodium chloride flush (NS) 0.9 % injection 3 mL (has no administration in time range)  0.9 %  sodium chloride infusion (has no administration in time range)  acetaminophen (TYLENOL) tablet 650 mg (has no administration in time range)  ondansetron (ZOFRAN) injection 4 mg (has no administration in time range)  heparin injection 5,000 Units (has no administration in time range)  potassium chloride 20 MEQ/15ML (10%) solution 10 mEq (has no administration in time range)    Mobility walks with device Low fall risk   Focused Assessments Cardiac Assessment Handoff:    Lab Results  Component Value Date   TROPONINI 0.08 ([place]) 02/14/2019   No results found for: DDIMER Does the Patient currently have chest pain? No     R Recommendations: See Admitting Provider Note  Report given to: Melissa RN  Additional Notes:

## 2019-02-14 NOTE — ED Provider Notes (Signed)
[facility] Emergency Department Provider Note  ____________________________________________   First MD Initiated Contact with Patient 02/14/19 1401     (approximate)  I have reviewed the triage vital signs and the nursing notes.   HISTORY  Chief Complaint Loss of Consciousness    HPI Jeremy [name] is a 58 y.o. male with past medical history of end-stage renal disease here with recurrent syncope.  The patient was just seen on 5/25.  His he was diagnosed with likely bronchitis/URI.  He states that at that time, he had had a coughing spell that caused him to syncopized.  Since then, the patient has had 3-4 additional episodes of syncope.  These are all new.  He states that he frequently begins coughing, or feels nauseous, vomits, or feels diaphoretic followed by a syncopal episode.  He was just seen by his primary several days ago, and had a echocardiogram which showed a reduced ejection fraction, which is new for him.  He was also referred for        Past Medical History:  Diagnosis Date  . Bronchitis   . Hypertension   . Renal disorder    kidney failure, dialysis    Patient Active Problem List   Diagnosis Date Noted  . Acute encephalopathy 11/23/2018  . Influenza B 11/21/2018  . Pulmonary edema 09/22/2018  . Hyperkalemia 05/16/2018    Past Surgical History:  Procedure Laterality Date  . DIALYSIS FISTULA CREATION      Prior to Admission medications   Medication Sig Start Date End Date Taking? Authorizing Provider  amLODipine (NORVASC) 10 MG tablet Take 10 mg by mouth daily. 03/29/18  Yes [provider]  calcium acetate (PHOSLO) 667 MG capsule Take 667 mg by mouth 3 (three) times daily. 10/20/13  Yes [provider]  cinacalcet (SENSIPAR) 30 MG tablet Take 30 mg by mouth 3 (three) times a week. 09/04/15  Yes [provider]  gabapentin (NEURONTIN) 100 MG capsule Take 1 capsule (100 mg total) by mouth daily as needed  (pain). 11/22/18  Yes Mody, Ulice Bold, MD  metoprolol tartrate (LOPRESSOR) 25 MG tablet Take 25 mg by mouth daily. 03/29/18  Yes [provider]  Multiple Vitamins-Iron (MULTI-DAY PLUS IRON) TABS Take 1 tablet by mouth daily.   Yes [provider]  sevelamer carbonate (RENVELA) 800 MG tablet Take 1,600 mg by mouth 3 (three) times daily with meals. 03/02/18  Yes [provider]  umeclidinium-vilanterol (ANORO ELLIPTA) 62.5-25 MCG/INH AEPB Inhale 1 puff into the lungs daily. 02/02/19  Yes [provider]  VELPHORO 500 MG chewable tablet Chew 500 mg by mouth 6 (six) times daily. 05/27/18  Yes [provider]  albuterol (PROVENTIL HFA;VENTOLIN HFA) 108 (90 Base) MCG/ACT inhaler Inhale 2 puffs into the lungs every 6 (six) hours as needed for wheezing or shortness of breath. 11/19/18   Nena Polio, MD  guaiFENesin-dextromethorphan (ROBITUSSIN DM) 100-10 MG/5ML syrup Take 5 mLs by mouth every 4 (four) hours as needed for cough. Patient not taking: Reported on 02/14/2019 11/20/18   Vaughan Basta, MD  oseltamivir (TAMIFLU) 30 MG capsule Take 1 capsule (30 mg total) by mouth every other day. Only on days after dialysis Patient not taking: Reported on 02/14/2019 11/22/18   Bettey Costa, MD    Allergies Patient has no known allergies.  Family History  Problem Relation Age of Onset  . Hypertension Mother     Social History Social History   Tobacco Use  . Smoking status: Current  Every Day Smoker    Packs/day: 1.00    Types: Cigarettes  . Smokeless tobacco: Never Used  Substance Use Topics  . Alcohol use: Never    Frequency: Never  . Drug use: Yes    Types: Marijuana    Review of Systems Review of Systems  Constitutional: Positive for fatigue. Negative for chills and fever.  HENT: Negative for congestion and rhinorrhea.   Eyes: Negative for visual disturbance.  Respiratory: Positive for cough and shortness of breath.   Gastrointestinal: Negative for  abdominal pain, diarrhea, nausea and vomiting.  Genitourinary: Negative for dysuria and flank pain.  Skin: Negative for rash and wound.  Neurological: Positive for syncope and weakness. Negative for light-headedness.  All other systems reviewed and are negative.    ____________________________________________  PHYSICAL EXAM:  VITAL SIGNS: ED Triage Vitals  Enc Vitals Group     BP 02/14/19 1335 (!) 181/71     Pulse Rate 02/14/19 1335 86     Resp 02/14/19 1335 18     Temp 02/14/19 1335 98.2 F (36.8 C)     Temp Source 02/14/19 1335 Oral     SpO2 02/14/19 1335 93 %     Weight 02/14/19 1328 148 lb (67.1 kg)     Height 02/14/19 1328 5\' 6"  (1.676 m)     Head Circumference --      Peak Flow --      Pain Score 02/14/19 1328 0     Pain Loc --      Pain Edu? --      Excl. in [place]? --     Physical Exam Vitals signs and nursing note reviewed.  Constitutional:      General: He is not in acute distress.    Appearance: He is well-developed.  HENT:     Head: Normocephalic and atraumatic.  Eyes:     Conjunctiva/sclera: Conjunctivae normal.  Neck:     Musculoskeletal: Neck supple.  Cardiovascular:     Rate and Rhythm: Normal rate and regular rhythm.     Heart sounds: Normal heart sounds. No murmur. No friction rub.  Pulmonary:     Effort: Tachypnea present.     Breath sounds: Examination of the right-lower field reveals rales. Examination of the left-lower field reveals rales. Rales present. No wheezing.  Abdominal:     General: There is no distension.     Palpations: Abdomen is soft.     Tenderness: There is no abdominal tenderness.  Musculoskeletal:     Right lower leg: Edema present.     Left lower leg: Edema present.  Skin:    General: Skin is warm.     Capillary Refill: Capillary refill takes less than 2 seconds.  Neurological:     Mental Status: He is alert and oriented to person, place, and time.     Motor: No abnormal muscle tone.        ____________________________________________   LABS (all labs ordered are listed, but only abnormal results are displayed)  Labs Reviewed  BASIC METABOLIC PANEL - Abnormal; Notable for the following components:      Result Value   Potassium 3.1 (*)    Chloride 97 (*)    Glucose, Bld 130 (*)    BUN 59 (*)    Creatinine, Ser 9.22 (*)    Calcium 8.6 (*)    GFR calc non Af Amer 6 (*)    GFR calc Af Amer 7 (*)    Anion gap 17 (*)  All other components within normal limits  CBC - Abnormal; Notable for the following components:   RBC 3.09 (*)    Hemoglobin 9.3 (*)    HCT 28.9 (*)    RDW 16.5 (*)    All other components within normal limits  BRAIN NATRIURETIC PEPTIDE - Abnormal; Notable for the following components:   B Natriuretic Peptide 4,218.0 (*)    All other components within normal limits  TROPONIN I - Abnormal; Notable for the following components:   Troponin I 0.08 (*)    All other components within normal limits  SARS CORONAVIRUS 2 (HOSPITAL ORDER, [place] [facility])  URINALYSIS, COMPLETE (UACMP) WITH MICROSCOPIC    ____________________________________________  EKG: Normal sinus rhythm, ventricular rate 87.  PR 142, QRS 102, QTc 490.  No apparent acute ischemic changes. ________________________________________  RADIOLOGY All imaging, including plain films, CT scans, and ultrasounds, independently reviewed by me, and interpretations confirmed via formal radiology reads.  ED MD interpretation:  CXR: Persistent interstitial infiltrates, likely edema. No focal opacification.  Official radiology report(s): Dg Chest 2 View  Result Date: 02/14/2019 CLINICAL DATA:  Persistent cough EXAM: CHEST - 2 VIEW COMPARISON:  01/31/2019 FINDINGS: Enlargement of cardiac silhouette with pulmonary vascular congestion. Mild chronic elevation of RIGHT diaphragm. Persistent interstitial opacities which may represent pulmonary edema or atypical infection. Subsegmental  atelectasis RIGHT base. No pleural effusion or pneumothorax. Vascular stent at LEFT subclavian/axillary region. IMPRESSION: Enlargement of cardiac silhouette with pulmonary vascular congestion. Persistent interstitial infiltrates question pulmonary edema versus atypical infection. Minimal subsegmental atelectasis RIGHT base. Electronically Signed   By: Lavonia Dana M.D.   On: 02/14/2019 15:42    ____________________________________________  PROCEDURES   Procedure(s) performed (including Critical Care):  Procedures  ____________________________________________  INITIAL IMPRESSION / MDM / [place] / ED COURSE  As part of my medical decision making, I reviewed the following data within the electronic MEDICAL RECORD NUMBER Notes from prior ED visits and [place] Controlled Substance Database      *Jeremy [name] was evaluated in Emergency Department on 02/14/2019 for the symptoms described in the history of present illness. He was evaluated in the context of the global COVID-19 pandemic, which necessitated consideration that the patient might be at risk for infection with the SARS-CoV-2 virus that causes COVID-19. Institutional protocols and algorithms that pertain to the evaluation of patients at risk for COVID-19 are in a state of rapid change based on information released by regulatory bodies including the CDC and federal and state organizations. These policies and algorithms were followed during the patient's care in the ED.  Some ED evaluations and interventions may be delayed as a result of limited staffing during the pandemic.*      Medical Decision Making: 58 year old male here with recurrent syncope in the setting of worsening cough.  I suspect this could be cough syncope due to persistent hypervolemia and CHF/interstitial edema.  Reviewed his outside records, and EF has dropped to 30%.  I suspect he could be persistently hypervolemic in the setting of his end-stage renal disease, and may need  recurrent dialysis.  Discussed with Dr. Keturah Barre partner, and patient will be admitted for possible recurrent dialysis and syncope observation.  His electrolytes are consistent with recent dialysis with no apparent emergent pathology.  His QTC does seem prolonged, which is also concerning seems to be chronic.  He denies any head injury.  Neurological exam is nonfocal.  ____________________________________________  FINAL CLINICAL IMPRESSION(S) / ED DIAGNOSES  Final diagnoses:  Recurrent syncope  Acute systolic congestive heart failure ([place])     MEDICATIONS GIVEN DURING THIS VISIT:  Medications  sodium chloride flush (NS) 0.9 % injection 3 mL (has no administration in time range)     ED Discharge Orders    None       Note:  This document was prepared using Dragon voice recognition software and may include unintentional dictation errors.   Duffy Bruce, MD 02/14/19 2121

## 2019-02-15 ENCOUNTER — Encounter: Payer: Self-pay | Admitting: Medical Oncology

## 2019-02-15 ENCOUNTER — Emergency Department
Admission: EM | Admit: 2019-02-15 | Discharge: 2019-02-15 | Disposition: A | Discharge status: Home / Self Care | Payer: Medicare Other | Attending: Emergency Medicine | Admitting: Emergency Medicine

## 2019-02-15 ENCOUNTER — Emergency Department: Payer: Medicare Other

## 2019-02-15 DIAGNOSIS — F17210 Nicotine dependence, cigarettes, uncomplicated: Secondary | ICD-10-CM | POA: Insufficient documentation

## 2019-02-15 DIAGNOSIS — R059 Cough, unspecified: Secondary | ICD-10-CM

## 2019-02-15 DIAGNOSIS — M791 Myalgia, unspecified site: Secondary | ICD-10-CM | POA: Insufficient documentation

## 2019-02-15 DIAGNOSIS — R05 Cough: Secondary | ICD-10-CM | POA: Insufficient documentation

## 2019-02-15 DIAGNOSIS — R55 Syncope and collapse: Secondary | ICD-10-CM | POA: Diagnosis not present

## 2019-02-15 DIAGNOSIS — I120 Hypertensive chronic kidney disease with stage 5 chronic kidney disease or end stage renal disease: Secondary | ICD-10-CM | POA: Insufficient documentation

## 2019-02-15 DIAGNOSIS — M7918 Myalgia, other site: Secondary | ICD-10-CM

## 2019-02-15 DIAGNOSIS — Z992 Dependence on renal dialysis: Secondary | ICD-10-CM | POA: Insufficient documentation

## 2019-02-15 DIAGNOSIS — N186 End stage renal disease: Secondary | ICD-10-CM | POA: Insufficient documentation

## 2019-02-15 LAB — CBC WITH DIFFERENTIAL/PLATELET
Abs Immature Granulocytes: 0.03 10*3/uL (ref 0.00–0.07)
Basophils Absolute: 0.1 10*3/uL (ref 0.0–0.1)
Basophils Relative: 1 %
Eosinophils Absolute: 0.4 10*3/uL (ref 0.0–0.5)
Eosinophils Relative: 6 %
HCT: 30.1 % — ABNORMAL LOW (ref 39.0–52.0)
Hemoglobin: 9.5 g/dL — ABNORMAL LOW (ref 13.0–17.0)
Immature Granulocytes: 1 %
Lymphocytes Relative: 10 %
Lymphs Abs: 0.6 10*3/uL — ABNORMAL LOW (ref 0.7–4.0)
MCH: 30.1 pg (ref 26.0–34.0)
MCHC: 31.6 g/dL (ref 30.0–36.0)
MCV: 95.3 fL (ref 80.0–100.0)
Monocytes Absolute: 0.5 10*3/uL (ref 0.1–1.0)
Monocytes Relative: 10 %
Neutro Abs: 4.1 10*3/uL (ref 1.7–7.7)
Neutrophils Relative %: 72 %
Platelets: 275 10*3/uL (ref 150–400)
RBC: 3.16 MIL/uL — ABNORMAL LOW (ref 4.22–5.81)
RDW: 16.4 % — ABNORMAL HIGH (ref 11.5–15.5)
WBC: 5.7 10*3/uL (ref 4.0–10.5)
nRBC: 0 % (ref 0.0–0.2)

## 2019-02-15 LAB — BASIC METABOLIC PANEL
Anion gap: 14 (ref 5–15)
BUN: 71 mg/dL — ABNORMAL HIGH (ref 6–20)
CO2: 26 mmol/L (ref 22–32)
Calcium: 8.2 mg/dL — ABNORMAL LOW (ref 8.9–10.3)
Chloride: 96 mmol/L — ABNORMAL LOW (ref 98–111)
Creatinine, Ser: 10.74 mg/dL — ABNORMAL HIGH (ref 0.61–1.24)
GFR calc Af Amer: 5 mL/min — ABNORMAL LOW (ref >60)
GFR calc non Af Amer: 5 mL/min — ABNORMAL LOW (ref >60)
Glucose, Bld: 181 mg/dL — ABNORMAL HIGH (ref 70–99)
Potassium: 4 mmol/L (ref 3.5–5.1)
Sodium: 136 mmol/L (ref 135–145)

## 2019-02-15 LAB — COMPREHENSIVE METABOLIC PANEL
ALT: 17 U/L (ref 0–44)
AST: 22 U/L (ref 15–41)
Albumin: 3 g/dL — ABNORMAL LOW (ref 3.5–5.0)
Alkaline Phosphatase: 140 U/L — ABNORMAL HIGH (ref 38–126)
Anion gap: 16 — ABNORMAL HIGH (ref 5–15)
BUN: 72 mg/dL — ABNORMAL HIGH (ref 6–20)
CO2: 23 mmol/L (ref 22–32)
Calcium: 8.2 mg/dL — ABNORMAL LOW (ref 8.9–10.3)
Chloride: 99 mmol/L (ref 98–111)
Creatinine, Ser: 11.16 mg/dL — ABNORMAL HIGH (ref 0.61–1.24)
GFR calc Af Amer: 5 mL/min — ABNORMAL LOW (ref >60)
GFR calc non Af Amer: 5 mL/min — ABNORMAL LOW (ref >60)
Glucose, Bld: 129 mg/dL — ABNORMAL HIGH (ref 70–99)
Potassium: 5.1 mmol/L (ref 3.5–5.1)
Sodium: 138 mmol/L (ref 135–145)
Total Bilirubin: 0.7 mg/dL (ref 0.3–1.2)
Total Protein: 6.8 g/dL (ref 6.5–8.1)

## 2019-02-15 LAB — MRSA PCR SCREENING: MRSA by PCR: NEGATIVE

## 2019-02-15 LAB — TROPONIN I
Troponin I: 0.08 ng/mL (ref <0.03)
Troponin I: 0.08 ng/mL (ref <0.03)
Troponin I: 0.07 ng/mL (ref <0.03)

## 2019-02-15 MED ORDER — HYDROCOD POLST-CPM POLST ER 10-8 MG/5ML PO SUER: 5.0000 mL | Freq: Two times a day (BID) | ORAL | 0 refills | Status: DC

## 2019-02-15 MED ORDER — HYDROMORPHONE HCL 1 MG/ML IJ SOLN
0.5000 mg | Freq: Once | INTRAMUSCULAR | Status: AC
  Administered 2019-02-15: 0.5 mg via INTRAVENOUS
  Filled 2019-02-15 (×2): qty 1

## 2019-02-15 NOTE — ED Triage Notes (Signed)
Pt reports he was admitted yesterday for CHF, pt states that he had to leave AMA to go home and get his phone charger. Pt reports for the past couple of weeks he has been having syncopal episodes when he coughs. Pt also reports rt leg pain into lower back.

## 2019-02-15 NOTE — Progress Notes (Signed)
Patient left AMA. Patient refused to sign AMA papers. Nursing supervisor notified.

## 2019-02-15 NOTE — ED Provider Notes (Signed)
[facility] Emergency Department Provider Note       Time seen: ----------------------------------------- 9:14 AM on 02/15/2019 -----------------------------------------   I have reviewed the triage vital signs and the nursing notes.  HISTORY   Chief Complaint Near Syncope and Leg Pain   HPI Jeremy [name] is a 58 y.o. male with a history of bronchitis, hypertension, renal disorder, end-stage renal disease on dialysis, CHF who presents to the ED for syncope with coughing, also reports right leg pain and low back pain.  Patient was just admitted to the hospital several days ago and left AMA.  Past Medical History:  Diagnosis Date  . Bronchitis   . Hypertension   . Renal disorder    kidney failure, dialysis    Patient Active Problem List   Diagnosis Date Noted  . Acute CHF (congestive heart failure) ([place]) 02/14/2019  . Acute encephalopathy 11/23/2018  . Influenza B 11/21/2018  . Pulmonary edema 09/22/2018  . Hyperkalemia 05/16/2018    Past Surgical History:  Procedure Laterality Date  . DIALYSIS FISTULA CREATION      Allergies Patient has no known allergies.  Social History Social History   Tobacco Use  . Smoking status: Current Every Day Smoker    Packs/day: 1.00    Types: Cigarettes  . Smokeless tobacco: Never Used  Substance Use Topics  . Alcohol use: Never    Frequency: Never  . Drug use: Yes    Types: Marijuana   Review of Systems Constitutional: Negative for fever. Cardiovascular: Negative for chest pain. Respiratory: Positive for shortness of breath with cough Gastrointestinal: Negative for abdominal pain, vomiting and diarrhea. Musculoskeletal: Positive for leg and back pain Skin: Negative for rash. Neurological: Negative for headaches, focal weakness or numbness.  All systems negative/normal/unremarkable except as stated in the HPI  ____________________________________________   PHYSICAL EXAM:  VITAL SIGNS: ED  Triage Vitals  Enc Vitals Group     BP 02/15/19 0732 (!) 161/76     Pulse Rate 02/15/19 0732 81     Resp 02/15/19 0732 18     Temp 02/15/19 0732 98 F (36.7 C)     Temp Source 02/15/19 0732 Oral     SpO2 02/15/19 0732 94 %     Weight 02/15/19 0733 147 lb 11.3 oz (67 kg)     Height 02/15/19 0733 5\' 6"  (1.676 m)     Head Circumference --      Peak Flow --      Pain Score 02/15/19 0732 10     Pain Loc --      Pain Edu? --      Excl. in [place]? --    Constitutional: Alert and oriented. Well appearing and in no distress. Eyes: Conjunctivae are normal. Normal extraocular movements. ENT      Head: Normocephalic and atraumatic.      Nose: No congestion/rhinnorhea.      Mouth/Throat: Mucous membranes are moist.      Neck: No stridor. Cardiovascular: Normal rate, regular rhythm. No murmurs, rubs, or gallops. Respiratory: Normal respiratory effort without tachypnea nor retractions. Breath sounds are clear and equal bilaterally. No wheezes/rales/rhonchi. Gastrointestinal: Soft and nontender. Normal bowel sounds Musculoskeletal: Nontender with normal range of motion in extremities. No lower extremity tenderness nor edema. Neurologic:  Normal speech and language. No gross focal neurologic deficits are appreciated.  Skin:  Skin is warm, dry and intact. No rash noted. Psychiatric: Mood and affect are normal. Speech and behavior are normal.  ____________________________________________  EKG: Interpreted  by me. ____________________________________________  ED COURSE:  As part of my medical decision making, I reviewed the following data within the [place] History obtained from family if available, nursing notes, old chart and ekg, as well as notes from prior ED visits. Patient presented for cough, syncope and right leg pain, we will assess with labs and imaging as indicated at this time.   Procedures  Jeremy [name] was evaluated in Emergency Department on 02/15/2019 for the  symptoms described in the history of present illness. He was evaluated in the context of the global COVID-19 pandemic, which necessitated consideration that the patient might be at risk for infection with the SARS-CoV-2 virus that causes COVID-19. Institutional protocols and algorithms that pertain to the evaluation of patients at risk for COVID-19 are in a state of rapid change based on information released by regulatory bodies including the CDC and federal and state organizations. These policies and algorithms were followed during the patient's care in the ED.  ____________________________________________   LABS (pertinent positives/negatives)  Labs Reviewed  CBC WITH DIFFERENTIAL/PLATELET - Abnormal; Notable for the following components:      Result Value   RBC 3.16 (*)    Hemoglobin 9.5 (*)    HCT 30.1 (*)    RDW 16.4 (*)    Lymphs Abs 0.6 (*)    All other components within normal limits  COMPREHENSIVE METABOLIC PANEL - Abnormal; Notable for the following components:   Glucose, Bld 129 (*)    BUN 72 (*)    Creatinine, Ser 11.16 (*)    Calcium 8.2 (*)    Albumin 3.0 (*)    Alkaline Phosphatase 140 (*)    GFR calc non Af Amer 5 (*)    GFR calc Af Amer 5 (*)    Anion gap 16 (*)    All other components within normal limits  TROPONIN I - Abnormal; Notable for the following components:   Troponin I 0.07 (*)    All other components within normal limits    RADIOLOGY Images were viewed by me  Chest x-ray, lumbar spine x-ray IMPRESSION: Cardiomegaly.  No active disease. IMPRESSION: 1. No acute or healing fracture. 2. Extensive atherosclerotic disease of the aorta. ____________________________________________   DIFFERENTIAL DIAGNOSIS   CHF, pulmonary edema, pericardial effusion, end-stage renal disease, electrolyte abnormality, vasovagal syncope, sciatica, musculoskeletal pain  FINAL ASSESSMENT AND PLAN  Syncope, cough, musculoskeletal pain   Plan: The patient had presented  for syncope and leg pain. Patient's labs were consistent with his prior and with known end-stage renal disease on dialysis. Patient's imaging not reveal any acute process.  I think these are vasovagal events where he is coughing so hard that he syncopized this.  I will prescribe cough medicine for him and he is cleared for outpatient follow-up and dialysis tomorrow as scheduled.  Back or leg pain is likely musculoskeletal in origin.   Laurence Aly, MD    Note: This note was generated in part or whole with voice recognition software. Voice recognition is usually quite accurate but there are transcription errors that can and very often do occur. I apologize for any typographical errors that were not detected and corrected.     Earleen Newport, MD 02/15/19 1049

## 2019-02-15 NOTE — ED Notes (Signed)
Dr Jimmye Norman notified of critical troponin 0.07 with no new orders

## 2019-02-15 NOTE — ED Notes (Signed)
Patient discharged by Dr. Jimmye Norman. No signature obtained or discharge vital signs.

## 2019-02-15 NOTE — ED Notes (Signed)
Patient is sleeping on stretcher in the hallway. NAD. Patient told the tech that he wanted his prescriptions sent to [facility] on [address]. Dr. Jimmye Norman aware, but states patient is discharged.

## 2019-02-15 NOTE — ED Notes (Signed)
Patient advised that he would need to call for a ride due to the recent administration of Dilaudid. Patient states he has driven with more medication than that. Patient was advised that he would need to wait for 4 hours if he doesn't have a ride. Patient requested a remote for the TV. Dr. Jimmye Norman and Shan Levans RN aware of situation concerning the discharge.

## 2019-02-15 NOTE — Progress Notes (Signed)
Patient refused telemonitor. NP notified.

## 2019-02-15 NOTE — ED Notes (Signed)
Report given to [facility]

## 2019-03-14 ENCOUNTER — Other Ambulatory Visit: Payer: Self-pay | Admitting: Nephrology

## 2019-03-14 DIAGNOSIS — N186 End stage renal disease: Secondary | ICD-10-CM

## 2019-03-14 DIAGNOSIS — R05 Cough: Secondary | ICD-10-CM

## 2019-03-14 DIAGNOSIS — R059 Cough, unspecified: Secondary | ICD-10-CM

## 2019-03-15 ENCOUNTER — Ambulatory Visit
Admission: RE | Admit: 2019-03-15 | Discharge: 2019-03-15 | Disposition: A | Discharge status: Home / Self Care | Payer: Medicare Other | Source: Ambulatory Visit | Attending: Nephrology | Admitting: Nephrology

## 2019-03-15 DIAGNOSIS — Z992 Dependence on renal dialysis: Secondary | ICD-10-CM | POA: Diagnosis present

## 2019-03-15 DIAGNOSIS — N186 End stage renal disease: Secondary | ICD-10-CM

## 2019-03-15 DIAGNOSIS — R05 Cough: Secondary | ICD-10-CM

## 2019-03-15 DIAGNOSIS — R059 Cough, unspecified: Secondary | ICD-10-CM

## 2019-03-30 DIAGNOSIS — Z79899 Other long term (current) drug therapy: Secondary | ICD-10-CM | POA: Insufficient documentation

## 2019-03-30 DIAGNOSIS — F17210 Nicotine dependence, cigarettes, uncomplicated: Secondary | ICD-10-CM | POA: Insufficient documentation

## 2019-03-30 DIAGNOSIS — J181 Lobar pneumonia, unspecified organism: Secondary | ICD-10-CM | POA: Insufficient documentation

## 2019-03-30 DIAGNOSIS — E875 Hyperkalemia: Secondary | ICD-10-CM | POA: Diagnosis not present

## 2019-03-30 DIAGNOSIS — T600X1A Toxic effect of organophosphate and carbamate insecticides, accidental (unintentional), initial encounter: Secondary | ICD-10-CM | POA: Insufficient documentation

## 2019-03-30 DIAGNOSIS — I10 Essential (primary) hypertension: Secondary | ICD-10-CM | POA: Insufficient documentation

## 2019-03-31 ENCOUNTER — Emergency Department: Payer: Medicare Other

## 2019-03-31 ENCOUNTER — Encounter: Payer: Self-pay | Admitting: Emergency Medicine

## 2019-03-31 ENCOUNTER — Emergency Department
Admission: EM | Admit: 2019-03-31 | Discharge: 2019-03-31 | Disposition: A | Discharge status: Home / Self Care | Payer: Medicare Other | Source: Home / Self Care | Attending: Emergency Medicine | Admitting: Emergency Medicine

## 2019-03-31 ENCOUNTER — Other Ambulatory Visit: Payer: Self-pay

## 2019-03-31 DIAGNOSIS — J189 Pneumonia, unspecified organism: Secondary | ICD-10-CM

## 2019-03-31 DIAGNOSIS — T600X1A Toxic effect of organophosphate and carbamate insecticides, accidental (unintentional), initial encounter: Secondary | ICD-10-CM

## 2019-03-31 LAB — CBC
HCT: 32 % — ABNORMAL LOW (ref 39.0–52.0)
Hemoglobin: 10.1 g/dL — ABNORMAL LOW (ref 13.0–17.0)
MCH: 30.1 pg (ref 26.0–34.0)
MCHC: 31.6 g/dL (ref 30.0–36.0)
MCV: 95.2 fL (ref 80.0–100.0)
Platelets: 212 10*3/uL (ref 150–400)
RBC: 3.36 MIL/uL — ABNORMAL LOW (ref 4.22–5.81)
RDW: 16.3 % — ABNORMAL HIGH (ref 11.5–15.5)
WBC: 5.4 10*3/uL (ref 4.0–10.5)
nRBC: 0 % (ref 0.0–0.2)

## 2019-03-31 LAB — COMPREHENSIVE METABOLIC PANEL
ALT: 11 U/L (ref 0–44)
AST: 24 U/L (ref 15–41)
Albumin: 3.4 g/dL — ABNORMAL LOW (ref 3.5–5.0)
Alkaline Phosphatase: 159 U/L — ABNORMAL HIGH (ref 38–126)
Anion gap: 15 (ref 5–15)
BUN: 86 mg/dL — ABNORMAL HIGH (ref 6–20)
CO2: 20 mmol/L — ABNORMAL LOW (ref 22–32)
Calcium: 8.1 mg/dL — ABNORMAL LOW (ref 8.9–10.3)
Chloride: 100 mmol/L (ref 98–111)
Creatinine, Ser: 13.39 mg/dL — ABNORMAL HIGH (ref 0.61–1.24)
GFR calc Af Amer: 4 mL/min — ABNORMAL LOW (ref >60)
GFR calc non Af Amer: 4 mL/min — ABNORMAL LOW (ref >60)
Glucose, Bld: 118 mg/dL — ABNORMAL HIGH (ref 70–99)
Potassium: 5.9 mmol/L — ABNORMAL HIGH (ref 3.5–5.1)
Sodium: 135 mmol/L (ref 135–145)
Total Bilirubin: 0.9 mg/dL (ref 0.3–1.2)
Total Protein: 6.8 g/dL (ref 6.5–8.1)

## 2019-03-31 MED ORDER — ATROPINE SULFATE 1 MG/10ML IJ SOSY: 0.4000 mg | PREFILLED_SYRINGE | Freq: Once | INTRAMUSCULAR | Status: DC

## 2019-03-31 MED ORDER — AZITHROMYCIN 500 MG PO TABS
500.0000 mg | ORAL_TABLET | Freq: Once | ORAL | Status: AC
  Administered 2019-03-31: 500 mg via ORAL
  Filled 2019-03-31: qty 1

## 2019-03-31 MED ORDER — AZITHROMYCIN 250 MG PO TABS: ORAL_TABLET | ORAL | 0 refills | Status: AC

## 2019-03-31 NOTE — ED Notes (Signed)
Lab sent  Pt refusing an iv   md aware. 100% oxygen nonrebreather in place.

## 2019-03-31 NOTE — ED Notes (Signed)
ED Provider at bedside. 

## 2019-03-31 NOTE — ED Provider Notes (Signed)
[facility] Emergency Department Provider Note __   First MD Initiated Contact with Patient 03/31/19 [phone]     (approximate)  I have reviewed the triage vital signs and the nursing notes.   HISTORY  Chief Complaint Cough   HPI Jeremy [name] is a 58 y.o. male with below list of previous medical conditions presents to the emergency department via EMS with history of acute onset of coughing after he "set off to cockroach bombs" in his kitchen tonight.  Patient states that he went into his bedroom after doing so and shortly thereafter started coughing.  Patient states that he has been coughing nonstop for approximately 1 hour before arrival to the emergency department.  Patient states he was in the home for a total of 20 minutes after setting off the road bombs.  Patient also admits to tearing of the eyes no diarrhea no vomiting       Past Medical History:  Diagnosis Date   Bronchitis    Hypertension    Renal disorder    kidney failure, dialysis    Patient Active Problem List   Diagnosis Date Noted   Acute CHF (congestive heart failure) ([place]) 02/14/2019   Acute encephalopathy 11/23/2018   Influenza B 11/21/2018   Pulmonary edema 09/22/2018   Hyperkalemia 05/16/2018    Past Surgical History:  Procedure Laterality Date   DIALYSIS FISTULA CREATION      Prior to Admission medications   Medication Sig Start Date End Date Taking? Authorizing Provider  albuterol (PROVENTIL HFA;VENTOLIN HFA) 108 (90 Base) MCG/ACT inhaler Inhale 2 puffs into the lungs every 6 (six) hours as needed for wheezing or shortness of breath. 11/19/18   Nena Polio, MD  amLODipine (NORVASC) 10 MG tablet Take 10 mg by mouth daily. 03/29/18   [provider]  calcium acetate (PHOSLO) 667 MG capsule Take 667 mg by mouth 3 (three) times daily. 10/20/13   [provider]  chlorpheniramine-HYDROcodone (TUSSIONEX PENNKINETIC ER) 10-8 MG/5ML SUER Take 5 mLs by mouth  2 (two) times daily. 02/15/19   Earleen Newport, MD  cinacalcet (SENSIPAR) 30 MG tablet Take 30 mg by mouth 3 (three) times a week. 09/04/15   [provider]  gabapentin (NEURONTIN) 100 MG capsule Take 1 capsule (100 mg total) by mouth daily as needed (pain). Patient taking differently: Take 300 mg by mouth daily.  11/22/18   Bettey Costa, MD  metoprolol tartrate (LOPRESSOR) 25 MG tablet Take 25 mg by mouth daily. 03/29/18   [provider]  Multiple Vitamins-Iron (MULTI-DAY PLUS IRON) TABS Take 1 tablet by mouth daily.    [provider]  umeclidinium-vilanterol (ANORO ELLIPTA) 62.5-25 MCG/INH AEPB Inhale 1 puff into the lungs daily. 02/02/19   [provider]    Allergies Patient has no known allergies.  Family History  Problem Relation Age of Onset   Hypertension Mother     Social History Social History   Tobacco Use   Smoking status: Current Every Day Smoker    Packs/day: 1.00    Types: Cigarettes   Smokeless tobacco: Never Used  Substance Use Topics   Alcohol use: Never    Frequency: Never   Drug use: Yes    Types: Marijuana    Review of Systems Constitutional: No fever/chills Eyes: No visual changes. ENT: No sore throat. Cardiovascular: Denies chest pain. Respiratory: Denies shortness of breath. Gastrointestinal: No abdominal pain.  No nausea, no vomiting.  No diarrhea.  No constipation. Genitourinary: Negative for  dysuria. Musculoskeletal: Negative for neck pain.  Negative for back pain. Integumentary: Negative for rash. Neurological: Negative for headaches, focal weakness or numbness.   ____________________________________________   PHYSICAL EXAM:  VITAL SIGNS: ED Triage Vitals  Enc Vitals Group     BP 03/31/19 0013 136/69     Pulse Rate 03/31/19 0013 84     Resp 03/31/19 0013 20     Temp 03/31/19 0013 99.2 F (37.3 C)     Temp Source 03/31/19 0013 Oral     SpO2 03/31/19 0013 93 %     Weight 03/31/19 0017  67.1 kg (148 lb)     Height 03/31/19 0017 1.676 m (5\' 6" )     Head Circumference --      Peak Flow --      Pain Score 03/31/19 0017 0     Pain Loc --      Pain Edu? --      Excl. in [place]? --     Constitutional: Alert and oriented. Well appearing and in no acute distress. Eyes: Conjunctivae are normal. PERRL. EOMI. Mouth/Throat: Mucous membranes are moist.  Oropharynx non-erythematous. Neck: No stridor.  Cardiovascular: Normal rate, regular rhythm. Good peripheral circulation. Grossly normal heart sounds. Respiratory: Normal respiratory effort.  No retractions. No audible wheezing. Gastrointestinal: Soft and nontender. No distention.  Musculoskeletal: No lower extremity tenderness nor edema. No gross deformities of extremities. Neurologic:  Normal speech and language. No gross focal neurologic deficits are appreciated.  Skin:  Skin is warm, dry and intact. No rash noted. Psychiatric: Mood and affect are normal. Speech and behavior are normal.  ____________________________________________   LABS (all labs ordered are listed, but only abnormal results are displayed)  Labs Reviewed  CBC - Abnormal; Notable for the following components:      Result Value   RBC 3.36 (*)    Hemoglobin 10.1 (*)    HCT 32.0 (*)    RDW 16.3 (*)    All other components within normal limits  COMPREHENSIVE METABOLIC PANEL - Abnormal; Notable for the following components:   Potassium 5.9 (*)    CO2 20 (*)    Glucose, Bld 118 (*)    BUN 86 (*)    Creatinine, Ser 13.39 (*)    Calcium 8.1 (*)    Albumin 3.4 (*)    Alkaline Phosphatase 159 (*)    GFR calc non Af Amer 4 (*)    GFR calc Af Amer 4 (*)    All other components within normal limits   ____________________________________________  EKG  ED ECG REPORT I, [place] N Tyniah Kastens, the attending physician, personally viewed and interpreted this ECG.   Date: 03/31/2019  EKG Time: 12:22 AM  Rate: 83  Rhythm: Normal sinus rhythm  Axis: Normal   Intervals:Normal   ST&T Change: None  ____________________________________________  RADIOLOGY I, Winston N Camber Ninh, personally viewed and evaluated these images (plain radiographs) as part of my medical decision making, as well as reviewing the written report by the radiologist.  ED MD interpretation: Worsening right basilar consolidation likely infection per radiologist on chest x-ray interpretation.  Official radiology report(s): Dg Chest Portable 1 View  Result Date: 03/31/2019 CLINICAL DATA:  Cough and dyspnea EXAM: PORTABLE CHEST 1 VIEW COMPARISON:  03/15/2019 FINDINGS: Worsening right basilar opacity, likely indicating infection. Mild cardiomegaly. No pleural effusion or pneumothorax. IMPRESSION: Worsening right basilar consolidation, likely infection. Electronically Signed   By: Ulyses Jarred M.D.   On: 03/31/2019 01:51    ____________________________________________  PROCEDURES   Procedure(s) performed (including Critical Care):  Procedures   ____________________________________________   INITIAL IMPRESSION / MDM / ASSESSMENT AND PLAN / ED COURSE  As part of my medical decision making, I reviewed the following data within the electronic MEDICAL RECORD NUMBER   58 year old male presenting with above-stated history and physical exam consistent with organophosphate poisoning.  Patient without any salivation lacrimation urination defecation vomiting.  However suspect bronchospasm given patient's initial presentation of coughing.  Patient with no further coughing at this time.  Patient states that his coughing proceeded actually the release of the "roach bomb".  Patient states that he completed a 10-day course of antibiotics without improvement.  Patient denies any fever no known sick contact.  Chest x-ray revealed evidence of a right lower lobe pneumonia for which patient will be prescribed azithromycin.  ____________________________________________  FINAL CLINICAL IMPRESSION(S)  / ED DIAGNOSES  Final diagnoses:  Organophosphate poisoning, accidental or unintentional, initial encounter  Pneumonia of right lower lobe due to infectious organism [facility])     MEDICATIONS GIVEN DURING THIS VISIT:  Medications  atropine 1 MG/10ML injection 0.4 mg (has no administration in time range)  azithromycin (ZITHROMAX) tablet 500 mg (500 mg Oral Given 03/31/19 0315)     ED Discharge Orders    None      *Please note:  Kylo Gavin was evaluated in Emergency Department on 03/31/2019 for the symptoms described in the history of present illness. He was evaluated in the context of the global COVID-19 pandemic, which necessitated consideration that the patient might be at risk for infection with the SARS-CoV-2 virus that causes COVID-19. Institutional protocols and algorithms that pertain to the evaluation of patients at risk for COVID-19 are in a state of rapid change based on information released by regulatory bodies including the CDC and federal and state organizations. These policies and algorithms were followed during the patient's care in the ED.  Some ED evaluations and interventions may be delayed as a result of limited staffing during the pandemic.*  Note:  This document was prepared using Dragon voice recognition software and may include unintentional dictation errors.   Gregor Hams, MD 03/31/19 [phone]

## 2019-03-31 NOTE — ED Notes (Signed)
Report off to jen rn.  

## 2019-03-31 NOTE — ED Triage Notes (Signed)
Pt presents to ED via EMS with frequent cough after setting off 2 "cockroach bombs" in his kitchen. Pt states he hid in the bathroom so the fumes wouldn't harm him but reports very quickly he noticed the smell in the bathroom and he began coughing. Pt reports "coughing non-stop for over an hour". Pt states he just wants to check and make sure his lungs are ok. Pt denies sob. No cough present since arrival to triage.

## 2019-04-02 ENCOUNTER — Inpatient Hospital Stay
Admission: EM | Admit: 2019-04-02 | Discharge: 2019-04-04 | DRG: 640 | Disposition: A | Discharge status: Home / Self Care | Payer: Medicare Other | Attending: Internal Medicine | Admitting: Internal Medicine

## 2019-04-02 ENCOUNTER — Emergency Department: Payer: Medicare Other

## 2019-04-02 ENCOUNTER — Encounter: Payer: Self-pay | Admitting: Emergency Medicine

## 2019-04-02 ENCOUNTER — Other Ambulatory Visit: Payer: Self-pay

## 2019-04-02 DIAGNOSIS — N186 End stage renal disease: Secondary | ICD-10-CM | POA: Diagnosis present

## 2019-04-02 DIAGNOSIS — J189 Pneumonia, unspecified organism: Secondary | ICD-10-CM | POA: Diagnosis present

## 2019-04-02 DIAGNOSIS — I429 Cardiomyopathy, unspecified: Secondary | ICD-10-CM | POA: Diagnosis present

## 2019-04-02 DIAGNOSIS — Z8701 Personal history of pneumonia (recurrent): Secondary | ICD-10-CM | POA: Diagnosis not present

## 2019-04-02 DIAGNOSIS — E1122 Type 2 diabetes mellitus with diabetic chronic kidney disease: Secondary | ICD-10-CM | POA: Diagnosis present

## 2019-04-02 DIAGNOSIS — E875 Hyperkalemia: Secondary | ICD-10-CM | POA: Diagnosis present

## 2019-04-02 DIAGNOSIS — I509 Heart failure, unspecified: Secondary | ICD-10-CM | POA: Diagnosis present

## 2019-04-02 DIAGNOSIS — Z9115 Patient's noncompliance with renal dialysis: Secondary | ICD-10-CM

## 2019-04-02 DIAGNOSIS — I132 Hypertensive heart and chronic kidney disease with heart failure and with stage 5 chronic kidney disease, or end stage renal disease: Secondary | ICD-10-CM | POA: Diagnosis present

## 2019-04-02 DIAGNOSIS — Z79899 Other long term (current) drug therapy: Secondary | ICD-10-CM

## 2019-04-02 DIAGNOSIS — Z992 Dependence on renal dialysis: Secondary | ICD-10-CM | POA: Diagnosis not present

## 2019-04-02 DIAGNOSIS — F17210 Nicotine dependence, cigarettes, uncomplicated: Secondary | ICD-10-CM | POA: Diagnosis present

## 2019-04-02 DIAGNOSIS — Z20828 Contact with and (suspected) exposure to other viral communicable diseases: Secondary | ICD-10-CM | POA: Diagnosis present

## 2019-04-02 DIAGNOSIS — R55 Syncope and collapse: Secondary | ICD-10-CM

## 2019-04-02 LAB — COMPREHENSIVE METABOLIC PANEL
ALT: 21 U/L (ref 0–44)
AST: 29 U/L (ref 15–41)
Albumin: 3.6 g/dL (ref 3.5–5.0)
Alkaline Phosphatase: 171 U/L — ABNORMAL HIGH (ref 38–126)
Anion gap: 20 — ABNORMAL HIGH (ref 5–15)
BUN: 124 mg/dL — ABNORMAL HIGH (ref 6–20)
CO2: 15 mmol/L — ABNORMAL LOW (ref 22–32)
Calcium: 8.8 mg/dL — ABNORMAL LOW (ref 8.9–10.3)
Chloride: 97 mmol/L — ABNORMAL LOW (ref 98–111)
Creatinine, Ser: 16.98 mg/dL — ABNORMAL HIGH (ref 0.61–1.24)
GFR calc Af Amer: 3 mL/min — ABNORMAL LOW (ref >60)
GFR calc non Af Amer: 3 mL/min — ABNORMAL LOW (ref >60)
Glucose, Bld: 109 mg/dL — ABNORMAL HIGH (ref 70–99)
Potassium: 6.2 mmol/L — ABNORMAL HIGH (ref 3.5–5.1)
Sodium: 132 mmol/L — ABNORMAL LOW (ref 135–145)
Total Bilirubin: 0.7 mg/dL (ref 0.3–1.2)
Total Protein: 7.2 g/dL (ref 6.5–8.1)

## 2019-04-02 LAB — CBC WITH DIFFERENTIAL/PLATELET
Abs Immature Granulocytes: 0.01 K/uL (ref 0.00–0.07)
Basophils Absolute: 0 K/uL (ref 0.0–0.1)
Basophils Relative: 1 %
Eosinophils Absolute: 0.2 K/uL (ref 0.0–0.5)
Eosinophils Relative: 4 %
HCT: 30.8 % — ABNORMAL LOW (ref 39.0–52.0)
Hemoglobin: 9.9 g/dL — ABNORMAL LOW (ref 13.0–17.0)
Immature Granulocytes: 0 %
Lymphocytes Relative: 14 %
Lymphs Abs: 0.6 K/uL — ABNORMAL LOW (ref 0.7–4.0)
MCH: 30 pg (ref 26.0–34.0)
MCHC: 32.1 g/dL (ref 30.0–36.0)
MCV: 93.3 fL (ref 80.0–100.0)
Monocytes Absolute: 0.4 K/uL (ref 0.1–1.0)
Monocytes Relative: 10 %
Neutro Abs: 3 K/uL (ref 1.7–7.7)
Neutrophils Relative %: 71 %
Platelets: 223 K/uL (ref 150–400)
RBC: 3.3 MIL/uL — ABNORMAL LOW (ref 4.22–5.81)
RDW: 16.2 % — ABNORMAL HIGH (ref 11.5–15.5)
WBC: 4.2 K/uL (ref 4.0–10.5)
nRBC: 0 % (ref 0.0–0.2)

## 2019-04-02 LAB — ETHANOL: Alcohol, Ethyl (B): <10 mg/dL (ref <10)

## 2019-04-02 LAB — LACTIC ACID, PLASMA
Lactic Acid, Venous: 0.9 mmol/L (ref 0.5–1.9)
Lactic Acid, Venous: 0.6 mmol/L (ref 0.5–1.9)

## 2019-04-02 LAB — MRSA PCR SCREENING: MRSA by PCR: NEGATIVE

## 2019-04-02 LAB — MAGNESIUM: Magnesium: 2.8 mg/dL — ABNORMAL HIGH (ref 1.7–2.4)

## 2019-04-02 LAB — TROPONIN I (HIGH SENSITIVITY)
Troponin I (High Sensitivity): 116 ng/L (ref <18)
Troponin I (High Sensitivity): 108 ng/L (ref <18)

## 2019-04-02 LAB — SARS CORONAVIRUS 2 BY RT PCR (HOSPITAL ORDER, PERFORMED IN ~~LOC~~ HOSPITAL LAB): SARS Coronavirus 2: NEGATIVE

## 2019-04-02 MED ORDER — CALCIUM ACETATE (PHOS BINDER) 667 MG PO CAPS
667.0000 mg | ORAL_CAPSULE | Freq: Three times a day (TID) | ORAL | Status: DC
  Administered 2019-04-02 – 2019-04-04 (×3): 667 mg via ORAL
  Filled 2019-04-02 (×7): qty 1

## 2019-04-02 MED ORDER — ALTEPLASE 2 MG IJ SOLR
2.0000 mg | Freq: Once | INTRAMUSCULAR | Status: DC | PRN
  Filled 2019-04-02: qty 2

## 2019-04-02 MED ORDER — LIDOCAINE-PRILOCAINE 2.5-2.5 % EX CREA
1.0000 "application " | TOPICAL_CREAM | CUTANEOUS | Status: DC | PRN
  Filled 2019-04-02: qty 5

## 2019-04-02 MED ORDER — HEPARIN SODIUM (PORCINE) 1000 UNIT/ML DIALYSIS
1000.0000 [IU] | INTRAMUSCULAR | Status: DC | PRN
  Filled 2019-04-02: qty 1

## 2019-04-02 MED ORDER — ONDANSETRON HCL 4 MG PO TABS: 4.0000 mg | ORAL_TABLET | Freq: Four times a day (QID) | ORAL | Status: DC | PRN

## 2019-04-02 MED ORDER — CALCIUM GLUCONATE-NACL 1-0.675 GM/50ML-% IV SOLN
1.0000 g | Freq: Once | INTRAVENOUS | Status: DC
  Filled 2019-04-02: qty 50

## 2019-04-02 MED ORDER — UMECLIDINIUM-VILANTEROL 62.5-25 MCG/INH IN AEPB
1.0000 | INHALATION_SPRAY | Freq: Every day | RESPIRATORY_TRACT | Status: DC
  Administered 2019-04-03 – 2019-04-04 (×2): 1 via RESPIRATORY_TRACT
  Filled 2019-04-02: qty 14

## 2019-04-02 MED ORDER — CINACALCET HCL 30 MG PO TABS
30.0000 mg | ORAL_TABLET | ORAL | Status: DC
  Administered 2019-04-04: 30 mg via ORAL
  Filled 2019-04-02: qty 1

## 2019-04-02 MED ORDER — PATIROMER SORBITEX CALCIUM 8.4 G PO PACK
8.4000 g | PACK | Freq: Once | ORAL | Status: AC
  Administered 2019-04-02: 8.4 g via ORAL
  Filled 2019-04-02: qty 1

## 2019-04-02 MED ORDER — ADULT MULTIVITAMIN W/MINERALS CH
1.0000 | ORAL_TABLET | Freq: Every day | ORAL | Status: DC
  Administered 2019-04-03 – 2019-04-04 (×2): 1 via ORAL
  Filled 2019-04-02 (×2): qty 1

## 2019-04-02 MED ORDER — ENOXAPARIN SODIUM 40 MG/0.4ML ~~LOC~~ SOLN: 40.0000 mg | SUBCUTANEOUS | Status: DC

## 2019-04-02 MED ORDER — INSULIN ASPART 100 UNIT/ML ~~LOC~~ SOLN
7.0000 [IU] | Freq: Once | SUBCUTANEOUS | Status: DC
  Filled 2019-04-02: qty 1

## 2019-04-02 MED ORDER — LIDOCAINE HCL (PF) 1 % IJ SOLN
5.0000 mL | INTRAMUSCULAR | Status: DC | PRN
  Filled 2019-04-02: qty 5

## 2019-04-02 MED ORDER — SODIUM CHLORIDE 0.9 % IV SOLN: 100.0000 mL | INTRAVENOUS | Status: DC | PRN

## 2019-04-02 MED ORDER — AZITHROMYCIN 250 MG PO TABS
250.0000 mg | ORAL_TABLET | Freq: Every day | ORAL | Status: AC
  Administered 2019-04-02 – 2019-04-04 (×3): 250 mg via ORAL
  Filled 2019-04-02 (×3): qty 1

## 2019-04-02 MED ORDER — GABAPENTIN 100 MG PO CAPS
100.0000 mg | ORAL_CAPSULE | Freq: Every day | ORAL | Status: DC | PRN
  Administered 2019-04-02: 100 mg via ORAL
  Filled 2019-04-02 (×2): qty 1

## 2019-04-02 MED ORDER — DEXTROSE 50 % IV SOLN
1.0000 | Freq: Once | INTRAVENOUS | Status: DC
  Filled 2019-04-02: qty 50

## 2019-04-02 MED ORDER — HEPARIN SODIUM (PORCINE) 5000 UNIT/ML IJ SOLN: 5000.0000 [IU] | Freq: Three times a day (TID) | INTRAMUSCULAR | Status: DC

## 2019-04-02 MED ORDER — ACETAMINOPHEN 325 MG PO TABS
650.0000 mg | ORAL_TABLET | Freq: Four times a day (QID) | ORAL | Status: DC | PRN
  Administered 2019-04-03 (×2): 650 mg via ORAL
  Filled 2019-04-02 (×2): qty 2

## 2019-04-02 MED ORDER — PENTAFLUOROPROP-TETRAFLUOROETH EX AERO
1.0000 "application " | INHALATION_SPRAY | CUTANEOUS | Status: DC | PRN
  Filled 2019-04-02: qty 30

## 2019-04-02 MED ORDER — SODIUM CHLORIDE 0.9 % IV SOLN
1.0000 g | Freq: Once | INTRAVENOUS | Status: DC
  Filled 2019-04-02: qty 10

## 2019-04-02 MED ORDER — SEVELAMER CARBONATE 800 MG PO TABS
800.0000 mg | ORAL_TABLET | Freq: Three times a day (TID) | ORAL | Status: DC
  Administered 2019-04-03 – 2019-04-04 (×2): 800 mg via ORAL
  Filled 2019-04-02 (×3): qty 1

## 2019-04-02 MED ORDER — CHLORHEXIDINE GLUCONATE CLOTH 2 % EX PADS
6.0000 | MEDICATED_PAD | Freq: Every day | CUTANEOUS | Status: DC
  Filled 2019-04-02: qty 6

## 2019-04-02 MED ORDER — ALBUTEROL SULFATE (2.5 MG/3ML) 0.083% IN NEBU
2.5000 mg | INHALATION_SOLUTION | Freq: Four times a day (QID) | RESPIRATORY_TRACT | Status: DC | PRN
  Administered 2019-04-04 (×2): 2.5 mg via RESPIRATORY_TRACT
  Filled 2019-04-02 (×2): qty 3

## 2019-04-02 MED ORDER — NICOTINE 21 MG/24HR TD PT24
21.0000 mg | MEDICATED_PATCH | Freq: Every day | TRANSDERMAL | Status: DC
  Administered 2019-04-02 – 2019-04-03 (×2): 21 mg via TRANSDERMAL
  Filled 2019-04-02 (×2): qty 1

## 2019-04-02 MED ORDER — ACETAMINOPHEN 650 MG RE SUPP: 650.0000 mg | Freq: Four times a day (QID) | RECTAL | Status: DC | PRN

## 2019-04-02 MED ORDER — ONDANSETRON HCL 4 MG/2ML IJ SOLN: 4.0000 mg | Freq: Four times a day (QID) | INTRAMUSCULAR | Status: DC | PRN

## 2019-04-02 NOTE — ED Notes (Signed)
This RN attempted to administer iv meds at this time. IV no longer working and unable to administer medications. Dialysis ready for pt at this time. Instead of postponing pt care MD states pt can go to dialysis at this time.
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
## 2019-04-02 NOTE — H&P (Signed)
[place] [facility] [place] NAME: Jeremy [name]    MR#:  [id number]  DATE OF BIRTH:  Jul 02, 1961  DATE OF ADMISSION:  04/02/2019  PRIMARY CARE PHYSICIAN: System, Pcp Not In   REQUESTING/REFERRING PHYSICIAN: McShane MD  CHIEF COMPLAINT:  Loss of consciousness multiple times  HISTORY OF PRESENT ILLNESS:  Jeremy [name]  is a 58 y.o. male with a known history of hypertension, bronchitis, end-stage renal disease on hemodialysis but missed his hemodialysis 2 times so far is presenting to the ED after he passed out multiple times in the past several days but shortness of breath prompted him to come to the ED .  Potassium at 6.2.  Patient was given stat calcium gluconate IV, dextrose and NovoLog IV.  Veltassa was also given.  EKG with no acute ST-T wave changes.  Nephrology notified for urgent hemodialysis, they are on their way to get hemodialysis as soon as possible.  Patient is reporting that he has been taking Z-Pak for recent diagnosis of pneumonia and has passed out several times in the past few days when the course was intense.  Interestingly also the patient is saying that he has been following with Dr. Everlene Balls as an outpatient for recurrent syncopal episodes and they are testing him for amyloidosis.  During my examination patient is reporting chest pain while coughing and no chest pain while resting.  High-sensitivity troponin at 116 and lactic acid at 0.9 and no leukocytosis.  No fever Chowbey test is negative patient continues to smoke  PAST MEDICAL HISTORY:   Past Medical History:  Diagnosis Date  . Bronchitis   . Hypertension   . Renal disorder    kidney failure, dialysis    PAST SURGICAL HISTOIRY:   Past Surgical History:  Procedure Laterality Date  . DIALYSIS FISTULA CREATION      SOCIAL HISTORY:   Social History   Tobacco Use  . Smoking status: Current Every Day Smoker    Packs/day: 1.00    Types: Cigarettes  . Smokeless tobacco:  Never Used  Substance Use Topics  . Alcohol use: Never    Frequency: Never    FAMILY HISTORY:   Family History  Problem Relation Age of Onset  . Hypertension Mother     DRUG ALLERGIES:  No Known Allergies  REVIEW OF SYSTEMS:  CONSTITUTIONAL: No fever, fatigue or weakness.  EYES: No blurred or double vision.  EARS, NOSE, AND THROAT: No tinnitus or ear pain.  RESPIRATORY: Reporting intermittent episodes of intense coughing spells , reports shortness of breath with minimal exertion, denies wheezing or hemoptysis.  CARDIOVASCULAR: Reporting chest pain while coughing and recurrent syncopal episodes with intense cough  GASTROINTESTINAL: No nausea, vomiting, diarrhea or abdominal pain.  GENITOURINARY: No dysuria, hematuria.  ENDOCRINE: No polyuria, nocturia,  HEMATOLOGY: No anemia, easy bruising or bleeding SKIN: No rash or lesion. MUSCULOSKELETAL: No joint pain or arthritis.   NEUROLOGIC: No tingling, numbness, weakness.  PSYCHIATRY: No anxiety or depression.   MEDICATIONS AT HOME:   Prior to Admission medications   Medication Sig Start Date End Date Taking? Authorizing Provider  albuterol (PROVENTIL HFA;VENTOLIN HFA) 108 (90 Base) MCG/ACT inhaler Inhale 2 puffs into the lungs every 6 (six) hours as needed for wheezing or shortness of breath. 11/19/18  Yes Nena Polio, MD  azithromycin (ZITHROMAX Z-PAK) 250 MG tablet Take 2 tablets (500 mg) on  Day 1,  followed by 1 tablet (250 mg) once daily on Days 2 through 5.  03/31/19 04/05/19 Yes Gregor Hams, MD  calcium acetate (PHOSLO) 667 MG capsule Take 667 mg by mouth 3 (three) times daily. 10/20/13  Yes [provider]  cinacalcet (SENSIPAR) 30 MG tablet Take 30 mg by mouth 3 (three) times a week. 09/04/15  Yes [provider]  gabapentin (NEURONTIN) 100 MG capsule Take 1 capsule (100 mg total) by mouth daily as needed (pain). Patient taking differently: Take 300 mg by mouth daily.  11/22/18  Yes Bettey Costa, MD   Multiple Vitamin (MULTIVITAMIN WITH MINERALS) TABS tablet Take 1 tablet by mouth daily.   Yes [provider]  sacubitril-valsartan (ENTRESTO) 24-26 MG Take 1 tablet by mouth 2 (two) times a day. 03/03/19  Yes [provider]  sevelamer carbonate (RENVELA) 800 MG tablet TK 3 TS PO TID WC 02/25/19  Yes [provider]  umeclidinium-vilanterol (ANORO ELLIPTA) 62.5-25 MCG/INH AEPB Inhale 1 puff into the lungs daily. 02/02/19  Yes [provider]      VITAL SIGNS:  Blood pressure (!) 156/99, pulse 72, temperature 97.9 F (36.6 C), temperature source Oral, resp. rate (!) 21, height 5\' 6"  (1.676 m), weight 67.1 kg, SpO2 96 %.  PHYSICAL EXAMINATION:  GENERAL:  58 y.o.-year-old patient lying in the bed with no acute distress.  EYES: Pupils equal, round, reactive to light and accommodation. No scleral icterus. Extraocular muscles intact.  HEENT: Head atraumatic, normocephalic. Oropharynx and nasopharynx clear.  NECK:  Supple, no jugular venous distention. No thyroid enlargement, no tenderness.  LUNGS: Normal breath sounds bilaterally, no wheezing, rales,rhonchi or crepitation. No use of accessory muscles of respiration.  Anterior chest wall is tender with palpation no rebound tenderness CARDIOVASCULAR: S1, S2 normal. No murmurs, rubs, or gallops.  ABDOMEN: Soft, nontender, nondistended. Bowel sounds present.  EXTREMITIES: No pedal edema, cyanosis, or clubbing.  NEUROLOGIC: Cranial nerves II through XII are intact. Muscle strength 5/5 in all extremities. Sensation intact. Gait not checked.  PSYCHIATRIC: The patient is alert and oriented x 3.  SKIN: No obvious rash, lesion, or ulcer.   LABORATORY PANEL:   CBC Recent Labs  Lab 04/02/19 1409  WBC 4.2  HGB 9.9*  HCT 30.8*  PLT 223   ------------------------------------------------------------------------------------------------------------------  Chemistries  Recent Labs  Lab 04/02/19 1243  NA 132*  K  6.2*  CL 97*  CO2 15*  GLUCOSE 109*  BUN 124*  CREATININE 16.98*  CALCIUM 8.8*  MG 2.8*  AST 29  ALT 21  ALKPHOS 171*  BILITOT 0.7   ------------------------------------------------------------------------------------------------------------------  Cardiac Enzymes No results for input(s): TROPONINI in the last 168 hours. ------------------------------------------------------------------------------------------------------------------  RADIOLOGY:  Dg Chest Port 1 View  Result Date: 04/02/2019 CLINICAL DATA:  Recurrent syncopal episodes. Cough. EXAM: PORTABLE CHEST 1 VIEW COMPARISON:  03/31/2019 FINDINGS: Stable cardiomegaly and pulmonary vascular congestion. Aortic atherosclerosis. Interval improvement in right lower lobe pulmonary infiltrate since previous study. Left lung remains clear. No evidence of pleural effusion. Stent again seen in region of left axillary artery. IMPRESSION: Interval improvement in right lower lobe infiltrate since prior exam. Continued radiographic follow-up recommended to confirm resolution. Stable cardiomegaly and pulmonary venous hypertension. Electronically Signed   By: Marlaine Hind M.D.   On: 04/02/2019 13:26    EKG:   Orders placed or performed during the hospital encounter of 04/02/19  . EKG 12-Lead  . EKG 12-Lead  . ED EKG  . ED EKG    IMPRESSION AND PLAN:    #Hyperkalemia from noncompliance with dialysis-missed hemodialysis 2 times Admit to telemetry after  stat hemodialysis.  Patient is getting urgent dialysis today Patient has received calcium gluconate IV, dextrose and NovoLog IV insulin followed by [facility] Nephrology consulted Dr. Zollie Scale  informed stat EKG with no acute T wave changes but nonspecific ST-T wave changes noticed  #Recurrent syncopal episodes Unclear etiology, patient states that his primary cardiologist Dr. Clayborn Bigness is ruling out amyloidosis Cardiology consult placed to notify Dr. Ubaldo Glassing he is aware Recent echocardiogram  in January 2020 has revealed 60 to 65% ejection fraction and LVH, will repeat another 1 Cycle troponins, monitor patient on telemetry   #End-stage renal disease on hemodialysis but noncompliance with dialysis Nephrology consult placed Dr. Zollie Scale notified patient is on his way for urgent hemodialysis.  #Recent history of pneumonia with pleuritic chest pain Antitussives and supportive treatment as needed Continue rest of his azithromycin for 3 more days to complete the course Chest x-ray today is revealing improving pneumonia  #Tobacco abuse disorder Counseled patient to quit smoking for 5 minutes.  Patient verbalized understanding and is agreeable with nicotine patch  All the records are reviewed and case discussed with ED provider. Management plans discussed with the patient, family and they are in agreement.  CODE STATUS: fc  TOTAL TIME TAKING CARE OF THIS PATIENT: 50 minutes.   Note: This dictation was prepared with Dragon dictation along with smaller phrase technology. Any transcriptional errors that result from this process are unintentional.  Nicholes Mango M.D on 04/02/2019 at 4:58 PM  Between 7am to 6pm - Pager - [phone]  After 6pm go to [email or web address] - password [email or web address] [place] [facility]  Office  [phone]  CC: Primary care physician; System, Pcp Not In
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
## 2019-04-02 NOTE — ED Provider Notes (Signed)
-----------------------------------------   4:30 PM on 04/02/2019 -----------------------------------------  I was informed by the RN Lemons that the patient's IV is not flushing.  Dialysis is ready to take him right now.  He has received the Veltassa.  Since he has no EKG changes and is stable, rather than holding him in the ED to reestablish IV access I advised that he should go to dialysis now since this will be the definitive treatment for his hyperkalemia.   Arta Silence, MD 04/02/19 [phone]

## 2019-04-02 NOTE — Progress Notes (Signed)
Family Meeting Note  Advance Directive:yes  Today a meeting took place with the Patient.    The following clinical team members were present during this meeting:MD  The following were discussed:Patient's diagnosis: Hyperkalemia, end-stage renal disease on hemodialysis but noncompliance with hemodialysis.  Recurrent syncopal episodes, bronchitis with recent diagnosis of pneumonia and continues to smoke is admitted to the hospital.  He is getting urgent hemodialysis.  Treatment plan of care discussed in detail with the patient.  He verbalized understanding of the plan.    Patient's progosis: Unable to determine and Goals for treatment: Full Code  Sister Jeremy [name] is the healthcare power of attorney  Additional follow-up to be provided: Hospitalist, nephrology, cardiology  Time spent during discussion:17 min  Jeremy Mango, MD

## 2019-04-02 NOTE — ED Notes (Signed)
Report received from iv team nurse michelle who stated that she was unsuccessful x3, only able to get 4 ml's of blood which was clotted in the syringe when attempt to transfer blood into tubes

## 2019-04-02 NOTE — ED Notes (Signed)
Pt transported to dialysis at this time.

## 2019-04-02 NOTE — ED Notes (Signed)
Lab at bedside to attempt second culture collection.

## 2019-04-02 NOTE — Progress Notes (Signed)
PHARMACIST - PHYSICIAN COMMUNICATION  CONCERNING:  Enoxaparin (Lovenox) for DVT Prophylaxis    RECOMMENDATION: Patient was prescribed enoxaprin 40mg  q24 hours for VTE prophylaxis.   Filed Weights   04/02/19 1212  Weight: 148 lb (67.1 kg)    Body mass index is 23.89 kg/m.  Estimated Creatinine Clearance: 4.3 mL/min (A) (by C-G formula based on SCr of 16.98 mg/dL (H)).   Based on [place] patient is candidate for [facility] d/t CrCl < 15 mL/min.  DESCRIPTION: Pharmacy has discontinued enoxaparin dose per [facility] policy.  Patient is now receiving SQH Q8H.   Rowland Lathe, PharmD Clinical Pharmacist  04/02/2019 6:17 PM

## 2019-04-02 NOTE — ED Notes (Signed)
Attempted to call report at this time 

## 2019-04-02 NOTE — ED Provider Notes (Addendum)
[facility] Emergency Department Provider Note  ____________________________________________   I have reviewed the triage vital signs and the nursing notes. Where available I have reviewed prior notes and, if possible and indicated, outside hospital notes.   Patient seen and evaluated during the coronavirus epidemic during a time with low staffing  Patient seen for the symptoms described in the history of present illness. She was evaluated in the context of the global COVID-19 pandemic, which necessitated consideration that the patient might be at risk for infection with the SARS-CoV-2 virus that causes COVID-19. Institutional protocols and algorithms that pertain to the evaluation of patients at risk for COVID-19 are in a state of rapid change based on information released by regulatory bodies including the CDC and federal and state organizations. These policies and algorithms were followed during the patient's care in the ED.    HISTORY  Chief Complaint Loss of Consciousness    HPI Jeremy [name] is a 58 y.o. male  With a history of recurrent bronchitis, CHF, pulmonary edema hyperkalemia end-stage renal Tuesday Thursday Saturday dialysis, who has not been to dialysis since last Tuesday, he overslept for 1 dialysis and elected not to go to the next 1.  States that he has been passing out.  Sounds like mostly this is a posttussive episode.  Has had 2 episodes this week.  Sounds as if he has had fiberglass several months.  He has been followed up by cardiology for this as an outpatient.   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  This was performed on the 22nd.  He does have a cough, he has been started on antibiotics which he states he is taking.  He denies any fever.  No productive cough.  He states usually is after he is coughing that he passes out.  States he has had negative coronavirus testing.  Denies any diarrhea.  Does not make urine states.   Does feel somewhat more short of breath after skipping dialysis x2.  Denies any chest pain. Wants to get to the bottom of all this and wants to be admitted.  Past Medical History:  Diagnosis Date  . Bronchitis   . Hypertension   . Renal disorder    kidney failure, dialysis    Patient Active Problem List   Diagnosis Date Noted  . Acute CHF (congestive heart failure) ([place]) 02/14/2019  . Acute encephalopathy 11/23/2018  . Influenza B 11/21/2018  . Pulmonary edema 09/22/2018  . Hyperkalemia 05/16/2018    Past Surgical History:  Procedure Laterality Date  . DIALYSIS FISTULA CREATION      Prior to Admission medications   Medication Sig Start Date End Date Taking? Authorizing Provider  albuterol (PROVENTIL HFA;VENTOLIN HFA) 108 (90 Base) MCG/ACT inhaler Inhale 2 puffs into the lungs every 6 (six) hours as needed for wheezing or shortness of breath. 11/19/18   Nena Polio, MD  amLODipine (NORVASC) 10 MG tablet Take 10 mg by mouth daily. 03/29/18   [provider]  azithromycin (ZITHROMAX Z-PAK) 250 MG tablet Take 2 tablets (500 mg) on  Day 1,  followed by 1 tablet (250 mg) once daily on Days 2 through 5. 03/31/19 04/05/19  Gregor Hams, MD  calcium acetate (PHOSLO) 667 MG capsule Take 667 mg by mouth 3 (three) times daily. 10/20/13   [provider]  chlorpheniramine-HYDROcodone (TUSSIONEX PENNKINETIC ER) 10-8 MG/5ML SUER Take 5 mLs by mouth 2 (two) times daily. 02/15/19   Earleen Newport,  MD  cinacalcet (SENSIPAR) 30 MG tablet Take 30 mg by mouth 3 (three) times a week. 09/04/15   [provider]  gabapentin (NEURONTIN) 100 MG capsule Take 1 capsule (100 mg total) by mouth daily as needed (pain). Patient taking differently: Take 300 mg by mouth daily.  11/22/18   Bettey Costa, MD  metoprolol tartrate (LOPRESSOR) 25 MG tablet Take 25 mg by mouth daily. 03/29/18   [provider]  Multiple Vitamins-Iron (MULTI-DAY PLUS IRON) TABS Take 1 tablet by  mouth daily.    [provider]  umeclidinium-vilanterol (ANORO ELLIPTA) 62.5-25 MCG/INH AEPB Inhale 1 puff into the lungs daily. 02/02/19   [provider]    Allergies Patient has no known allergies.  Family History  Problem Relation Age of Onset  . Hypertension Mother     Social History Social History   Tobacco Use  . Smoking status: Current Every Day Smoker    Packs/day: 1.00    Types: Cigarettes  . Smokeless tobacco: Never Used  Substance Use Topics  . Alcohol use: Never    Frequency: Never  . Drug use: Yes    Types: Marijuana    Review of Systems Constitutional: No fever/chills Eyes: No visual changes. ENT: No sore throat. No stiff neck no neck pain Cardiovascular: Denies chest pain. Respiratory: Positive shortness of breath. Gastrointestinal:   no vomiting.  No diarrhea.  No constipation. Genitourinary: Negative for dysuria. Musculoskeletal: Negative lower extremity swelling Skin: Negative for rash. Neurological: Negative for severe headaches, focal weakness or numbness.   ____________________________________________   PHYSICAL EXAM:  VITAL SIGNS: ED Triage Vitals [04/02/19 1212]  Enc Vitals Group     BP (!) 150/76     Pulse Rate 81     Resp 18     Temp 97.9 F (36.6 C)     Temp Source Oral     SpO2 95 %     Weight 148 lb (67.1 kg)     Height 5\' 6"  (1.676 m)     Head Circumference      Peak Flow      Pain Score 0     Pain Loc      Pain Edu?      Excl. in [place]?     Constitutional: Alert and oriented. Well appearing and in no acute distress. Eyes: Conjunctivae are normal Head: Atraumatic HEENT: No congestion/rhinnorhea. Mucous membranes are moist.  Oropharynx non-erythematous Neck:   Nontender with no meningismus, no masses, no stridor positive JVD Cardiovascular: Normal rate, regular rhythm. Grossly normal heart sounds.  Good peripheral circulation. Respiratory: Normal respiratory effort.  No retractions.  [place] in the bases  slightly increased respiratory rate. Abdominal: Soft and nontender. No distention. No guarding no rebound Back:  There is no focal tenderness or step off.  there is no midline tenderness there are no lesions noted. there is no CVA tenderness Musculoskeletal: No lower extremity tenderness, no upper extremity tenderness. No joint effusions, no DVT signs strong distal pulses mild edema Neurologic:  Normal speech and language. No gross focal neurologic deficits are appreciated.  Skin:  Skin is warm, dry and intact. No rash noted. Psychiatric: Mood and affect are him with anxious. Speech and behavior are normal.  ____________________________________________   LABS (all labs ordered are listed, but only abnormal results are displayed)  Labs Reviewed  SARS CORONAVIRUS 2 ([facility])  CULTURE, BLOOD (ROUTINE X 2)  CULTURE, BLOOD (ROUTINE X 2)  ETHANOL  COMPREHENSIVE  METABOLIC PANEL  CBC WITH DIFFERENTIAL/PLATELET  MAGNESIUM  LACTIC ACID, PLASMA  LACTIC ACID, PLASMA  TROPONIN I (HIGH SENSITIVITY)    Pertinent labs  results that were available during my care of the patient were reviewed by me and considered in my medical decision making (see chart for details). ____________________________________________  EKG  I personally interpreted any EKGs ordered by me or triage Sinus rhythm rate 76 bpm, no peak T waves noted, normal axis.  Nonspecific ST changes ____________________________________________  RADIOLOGY  Pertinent labs & imaging results that were available during my care of the patient were reviewed by me and considered in my medical decision making (see chart for details). If possible, patient and/or family made aware of any abnormal findings.  No results found. ____________________________________________    PROCEDURES  Procedure(s) performed: None  Procedures  Critical Care performed: CRITICAL CARE Performed by: Schuyler Amor   Total critical care time: 38 minutes  Critical care time was exclusive of separately billable procedures and treating other patients.  Critical care was necessary to treat or prevent imminent or life-threatening deterioration.  Critical care was time spent personally by me on the following activities: development of treatment plan with patient and/or surrogate as well as nursing, discussions with consultants, evaluation of patient's response to treatment, examination of patient, obtaining history from patient or surrogate, ordering and performing treatments and interventions, ordering and review of laboratory studies, ordering and review of radiographic studies, pulse oximetry and re-evaluation of patient's condition.   ____________________________________________   INITIAL IMPRESSION / ASSESSMENT AND PLAN / ED COURSE  Pertinent labs & imaging results that were available during my care of the patient were reviewed by me and considered in my medical decision making (see chart for details).   Patient here after passing out.  This time he fell into a chair on his porch.  Did not hit the ground no closed head injury, no headache.  Nothing indicate the need for emergent CT scan.  This is a second visit for "passing out" which sounds posttussive though is hard to tell.  Patient is somewhat vague about the details.  He has been getting significant outpatient work-up including a recent heart biopsy.  He denies any chest pain, he is mildly short of breath and looks as if he is missing dialysis as he is.  He does appear to be somewhat fluid overloaded.  He will obviously need to be admitted for dialysis given that he is just dialysis several times.  However, his EKG does not show any acute ischemic or hyperkalemic changes fortunately.  We will check blood work will check coronavirus for his cough we will recheck a chest x-ray, we will continue monitoring him closely in emergency department and he  will require admission.   ----------------------------------------- 3:53 PM on 04/02/2019 ----------------------------------------- Patient in no acute distress found out to Dr. Cherylann Banas was in a code he was connected to the patient's hyperkalemia for me but no acute EKG is elevations noted.  Patient will need to be admitted for dialysis and further work-up of his recurrent syncopal events.  Dr. Zollie Scale and Dr. Margaretmary Eddy both aware both agree with management and disposition.    ____________________________________________   FINAL CLINICAL IMPRESSION(S) / ED DIAGNOSES  Final diagnoses:  Syncope      This chart was dictated using voice recognition software.  Despite best efforts to proofread,  errors can occur which can change meaning.      Schuyler Amor, MD 04/02/19 1320  Schuyler Amor, MD 04/02/19 [phone]

## 2019-04-02 NOTE — ED Notes (Signed)
Lab notified of hard stick. Lab will come down and attempt to stick patient for labs.

## 2019-04-03 ENCOUNTER — Inpatient Hospital Stay: Payer: Medicare Other

## 2019-04-03 LAB — CBC
HCT: 29.4 % — ABNORMAL LOW (ref 39.0–52.0)
Hemoglobin: 9.4 g/dL — ABNORMAL LOW (ref 13.0–17.0)
MCH: 29.7 pg (ref 26.0–34.0)
MCHC: 32 g/dL (ref 30.0–36.0)
MCV: 92.7 fL (ref 80.0–100.0)
Platelets: 215 10*3/uL (ref 150–400)
RBC: 3.17 MIL/uL — ABNORMAL LOW (ref 4.22–5.81)
RDW: 15.9 % — ABNORMAL HIGH (ref 11.5–15.5)
WBC: 3.8 10*3/uL — ABNORMAL LOW (ref 4.0–10.5)
nRBC: 0 % (ref 0.0–0.2)

## 2019-04-03 LAB — COMPREHENSIVE METABOLIC PANEL
ALT: 19 U/L (ref 0–44)
AST: 26 U/L (ref 15–41)
Albumin: 3.4 g/dL — ABNORMAL LOW (ref 3.5–5.0)
Alkaline Phosphatase: 164 U/L — ABNORMAL HIGH (ref 38–126)
Anion gap: 13 (ref 5–15)
BUN: 60 mg/dL — ABNORMAL HIGH (ref 6–20)
CO2: 24 mmol/L (ref 22–32)
Calcium: 8.5 mg/dL — ABNORMAL LOW (ref 8.9–10.3)
Chloride: 99 mmol/L (ref 98–111)
Creatinine, Ser: 10.18 mg/dL — ABNORMAL HIGH (ref 0.61–1.24)
GFR calc Af Amer: 6 mL/min — ABNORMAL LOW (ref >60)
GFR calc non Af Amer: 5 mL/min — ABNORMAL LOW (ref >60)
Glucose, Bld: 61 mg/dL — ABNORMAL LOW (ref 70–99)
Potassium: 3.9 mmol/L (ref 3.5–5.1)
Sodium: 136 mmol/L (ref 135–145)
Total Bilirubin: 0.6 mg/dL (ref 0.3–1.2)
Total Protein: 6.7 g/dL (ref 6.5–8.1)

## 2019-04-03 MED ORDER — FLUTICASONE PROPIONATE 50 MCG/ACT NA SUSP
2.0000 | Freq: Every day | NASAL | Status: DC
  Administered 2019-04-03 – 2019-04-04 (×2): 2 via NASAL
  Filled 2019-04-03 (×2): qty 16

## 2019-04-03 MED ORDER — GUAIFENESIN-CODEINE 100-10 MG/5ML PO SOLN
10.0000 mL | ORAL | Status: DC | PRN
  Administered 2019-04-03 (×2): 10 mL via ORAL
  Filled 2019-04-03 (×4): qty 10

## 2019-04-03 MED ORDER — PANTOPRAZOLE SODIUM 40 MG PO TBEC
40.0000 mg | DELAYED_RELEASE_TABLET | Freq: Every day | ORAL | Status: DC
  Administered 2019-04-03 – 2019-04-04 (×2): 40 mg via ORAL
  Filled 2019-04-03 (×2): qty 1

## 2019-04-03 MED ORDER — BENZONATATE 100 MG PO CAPS
200.0000 mg | ORAL_CAPSULE | Freq: Three times a day (TID) | ORAL | Status: DC
  Administered 2019-04-03 – 2019-04-04 (×3): 200 mg via ORAL
  Filled 2019-04-03 (×3): qty 2

## 2019-04-03 MED ORDER — ZOLPIDEM TARTRATE 5 MG PO TABS
5.0000 mg | ORAL_TABLET | Freq: Once | ORAL | Status: AC
  Administered 2019-04-03: 21:00:00 5 mg via ORAL
  Filled 2019-04-03: qty 1

## 2019-04-03 MED ORDER — ACETAMINOPHEN 650 MG RE SUPP: 650.0000 mg | Freq: Four times a day (QID) | RECTAL | Status: DC | PRN

## 2019-04-03 MED ORDER — ACETAMINOPHEN 500 MG PO TABS
1000.0000 mg | ORAL_TABLET | Freq: Four times a day (QID) | ORAL | Status: DC | PRN
  Administered 2019-04-03: 1000 mg via ORAL
  Filled 2019-04-03: qty 2

## 2019-04-03 MED ORDER — GUAIFENESIN-DM 100-10 MG/5ML PO SYRP
5.0000 mL | ORAL_SOLUTION | ORAL | Status: DC | PRN
  Administered 2019-04-03 (×2): 5 mL via ORAL
  Filled 2019-04-03 (×2): qty 5

## 2019-04-03 NOTE — Progress Notes (Signed)
Pt took telemetry monitor off this morning and states he "doesn't want to wear it anymore." MD aware. Cardiac monitoring discontinued. Will continue to monitor.

## 2019-04-03 NOTE — TOC Initial Note (Signed)
Transition of [facility]) - Initial/Assessment Note    Patient Details  Name: Jeremy [name] MRN: [id number] Date of Birth: Jan 26, 1961  [facility] CM/SW Contact:    Latanya Maudlin, RN Phone Number: 04/03/2019, 9:31 AM  Clinical Narrative: [facility] consulted for high risk re admission assessment. Patient lives at home. Independent with all activities of daily living. Notified Dell Ponto, Dialysis liaison with patient pathways of admission. Per patient he drives himself to dialysis. Patient has concerns over his apartment being livable. Patient states that there are roaches there and it is unsanitary. Patient himself has exterminated the area but tells me his landlord is not helpful. Patient pays $650 a month for rent and would like to move to a comparable location. I have instructed patient to look into this. PCP appointment was made last admission but he did not keep appointment.                    Expected Discharge Plan: Home/Self Care Barriers to Discharge: Continued Medical Work up   Patient Goals and CMS Choice   CMS Medicare.gov Compare Post Acute Care list provided to:: Patient Choice offered to / list presented to : Patient  Expected Discharge Plan and Services Expected Discharge Plan: Home/Self Care   Discharge Planning Services: CM Consult   Living arrangements for the past 2 months: Apartment                                      Prior Living Arrangements/Services Living arrangements for the past 2 months: Apartment Lives with:: Self Patient language and need for interpreter reviewed:: Yes Do you feel safe going back to the place where you live?: No   pt has roaches  Need for Family Participation in Patient Care: No (Comment)     Criminal Activity/Legal Involvement Pertinent to Current Situation/Hospitalization: No - Comment as needed  Activities of Daily Living Home Assistive Devices/Equipment: Eyeglasses ADL Screening (condition at time of  admission) Patient's cognitive ability adequate to safely complete daily activities?: Yes Is the patient deaf or have difficulty hearing?: No Does the patient have difficulty seeing, even when wearing glasses/contacts?: No Does the patient have difficulty concentrating, remembering, or making decisions?: No Patient able to express need for assistance with ADLs?: Yes Does the patient have difficulty dressing or bathing?: No Independently performs ADLs?: Yes (appropriate for developmental age) Does the patient have difficulty walking or climbing stairs?: No Weakness of Legs: None Weakness of Arms/Hands: None  Permission Sought/Granted                  Emotional Assessment Appearance:: Appears stated age Attitude/Demeanor/Rapport: Engaged Affect (typically observed): Accepting Orientation: : Oriented to Self, Oriented to Place, Oriented to  Time, Oriented to Situation      Admission diagnosis:  Hyperkalemia [E87.5] Syncope [R55] End stage kidney disease ([place]) [N18.6] Patient Active Problem List   Diagnosis Date Noted  . Acute CHF (congestive heart failure) ([place]) 02/14/2019  . Acute encephalopathy 11/23/2018  . Influenza B 11/21/2018  . Pulmonary edema 09/22/2018  . Hyperkalemia 05/16/2018   PCP:  System, Pcp Not In Pharmacy:   [id number] [address], [place] [address] Phone: [phone] Fax: [phone]     Social Determinants of Health (SDOH) Interventions    Readmission  Risk Interventions Readmission Risk Prevention Plan 04/03/2019 11/24/2018 11/22/2018  Transportation Screening Complete Complete Complete  Medication Review Press photographer) Complete Complete Complete  PCP or Specialist appointment within 3-5 days of discharge - Complete Patient refused  [place] or [place] - (No Data) (No Data)  SW Recovery Care/Counseling Consult - (No Data) (No Data)   Palliative Care Screening Not Applicable Not Applicable Not [place] Not Applicable Not Applicable Not Applicable  Some recent data might be hidden

## 2019-04-03 NOTE — Progress Notes (Signed)
Now pt doesn't want to wear telemetry monitor anymore. He is stating that the monitor is "doing him no good." MD aware. Will continue to monitor.

## 2019-04-03 NOTE — Progress Notes (Signed)
[facility]                                                                                                                                                                                  Patient Demographics   Jeremy [name], is a 58 y.o. male, DOB - [birth year], TOI:[id number]  Admit date - 04/02/2019   Admitting Physician Nicholes Mango, MD  Outpatient Primary MD for the patient is System, Pcp Not In   LOS - 1  Subjective: Patient admitted with with syncope followed by coughing spells.  Patient states that he has been having the symptoms for the last 4 to 5 weeks.  States that he has been seen outside and nobody has been able to figure out what is happening with him.  Patient refused to wear his cardiac monitor.  He also had abnormal echo and has been worked up for cardiac amyloidosis.  Currently the work-up is in process.    Review of Systems:   CONSTITUTIONAL: No documented fever. No fatigue, weakness. No weight gain, no weight loss.  EYES: No blurry or double vision.  ENT: No tinnitus. No postnasal drip. No redness of the oropharynx.  RESPIRATORY: No cough, no wheeze, no hemoptysis. No dyspnea.  CARDIOVASCULAR: No chest pain. No orthopnea. No palpitations.  Positive syncope.  GASTROINTESTINAL: No nausea, no vomiting or diarrhea. No abdominal pain. No melena or hematochezia.  GENITOURINARY: No dysuria or hematuria.  ENDOCRINE: No polyuria or nocturia. No heat or cold intolerance.  HEMATOLOGY: No anemia. No bruising. No bleeding.  INTEGUMENTARY: No rashes. No lesions.  MUSCULOSKELETAL: No arthritis. No swelling. No gout.  NEUROLOGIC: No numbness, tingling, or ataxia. No seizure-type activity.  PSYCHIATRIC: No anxiety. No insomnia. No ADD.    Vitals:   Vitals:   04/02/19 2049 04/02/19 2125 04/03/19 0401 04/03/19 0750  BP: (!) 158/76 (!) 181/85 140/72 138/74  Pulse: 88 86 83 81  Resp: 18 20 17    Temp:  97.8 F (36.6 C) 98.7 F (37.1 C) 97.9 F  (36.6 C)  TempSrc:  Oral Oral Oral  SpO2: 100% 96% 94% 100%  Weight:  65.3 kg    Height:  5\' 6"  (1.676 m)      Wt Readings from Last 3 Encounters:  04/02/19 65.3 kg  03/31/19 67.1 kg  02/15/19 67 kg     Intake/Output Summary (Last 24 hours) at 04/03/2019 1406 Last data filed at 04/03/2019 1013 Gross per 24 hour  Intake 240 ml  Output 2500 ml  Net -2260 ml    Physical Exam:   GENERAL: Pleasant-appearing in no apparent distress.  HEAD, EYES, EARS, NOSE AND THROAT: Atraumatic, normocephalic. Extraocular  muscles are intact. Pupils equal and reactive to light. Sclerae anicteric. No conjunctival injection. No oro-pharyngeal erythema.  NECK: Supple. There is no jugular venous distention. No bruits, no lymphadenopathy, no thyromegaly.  HEART: Regular rate and rhythm,. No murmurs, no rubs, no clicks.  LUNGS: Clear to auscultation bilaterally. No rales or rhonchi. No wheezes.  ABDOMEN: Soft, flat, nontender, nondistended. Has good bowel sounds. No hepatosplenomegaly appreciated.  EXTREMITIES: No evidence of any cyanosis, clubbing, or peripheral edema.  +2 pedal and radial pulses bilaterally.  NEUROLOGIC: The patient is alert, awake, and oriented x3 with no focal motor or sensory deficits appreciated bilaterally.  SKIN: Moist and warm with no rashes appreciated.  Psych: Not anxious, depressed LN: No inguinal LN enlargement    Antibiotics   Anti-infectives (From admission, onward)   Start     Dose/Rate Route Frequency Ordered Stop   04/02/19 1815  azithromycin (ZITHROMAX) tablet 250 mg    Note to Pharmacy: Take 2 tablets (500 mg) on  Day 1,  followed by 1 tablet (250 mg) once daily on Days 2 through 5.     250 mg Oral Daily 04/02/19 1807 04/05/19 0959      Medications   Scheduled Meds: . azithromycin  250 mg Oral Daily  . benzonatate  200 mg Oral TID  . calcium acetate  667 mg Oral TID  . Chlorhexidine Gluconate Cloth  6 each Topical Q0600  . [START ON 04/04/2019] cinacalcet   30 mg Oral 3 times weekly  . fluticasone  2 spray Each Nare Daily  . heparin injection (subcutaneous)  5,000 Units Subcutaneous Q8H  . multivitamin with minerals  1 tablet Oral Daily  . nicotine  21 mg Transdermal Daily  . pantoprazole  40 mg Oral Daily  . sevelamer carbonate  800 mg Oral TID WC  . umeclidinium-vilanterol  1 puff Inhalation Daily   Continuous Infusions: . sodium chloride    . sodium chloride     PRN Meds:.sodium chloride, sodium chloride, acetaminophen **OR** acetaminophen, albuterol, alteplase, gabapentin, guaiFENesin-codeine, heparin, lidocaine (PF), lidocaine-prilocaine, ondansetron **OR** ondansetron (ZOFRAN) IV, pentafluoroprop-tetrafluoroeth   Data Review:   Micro Results Recent Results (from the past 240 hour(s))  SARS Coronavirus 2 (CEPHEID - Performed in [place] hospital lab), Hosp Order     Status: None   Collection Time: 04/02/19  1:45 PM   Specimen: Nasopharyngeal Swab  Result Value Ref Range Status   SARS Coronavirus 2 NEGATIVE NEGATIVE Final    Comment: (NOTE) If result is NEGATIVE SARS-CoV-2 target nucleic acids are NOT DETECTED. The SARS-CoV-2 RNA is generally detectable in upper and lower  respiratory specimens during the acute phase of infection. The lowest  concentration of SARS-CoV-2 viral copies this assay can detect is 250  copies / mL. A negative result does not preclude SARS-CoV-2 infection  and should not be used as the sole basis for treatment or other  patient management decisions.  A negative result may occur with  improper specimen collection / handling, submission of specimen other  than nasopharyngeal swab, presence of viral mutation(s) within the  areas targeted by this assay, and inadequate number of viral copies  (<250 copies / mL). A negative result must be combined with clinical  observations, patient history, and epidemiological information. If result is POSITIVE SARS-CoV-2 target nucleic acids are DETECTED. The  SARS-CoV-2 RNA is generally detectable in upper and lower  respiratory specimens dur ing the acute phase of infection.  Positive  results are indicative of active infection with SARS-CoV-2.  Clinical  correlation with patient history and other diagnostic information is  necessary to determine patient infection status.  Positive results do  not rule out bacterial infection or co-infection with other viruses. If result is PRESUMPTIVE POSTIVE SARS-CoV-2 nucleic acids MAY BE PRESENT.   A presumptive positive result was obtained on the submitted specimen  and confirmed on repeat testing.  While 2019 novel coronavirus  (SARS-CoV-2) nucleic acids may be present in the submitted sample  additional confirmatory testing may be necessary for epidemiological  and / or clinical management purposes  to differentiate between  SARS-CoV-2 and other Sarbecovirus currently known to infect humans.  If clinically indicated additional testing with an alternate test  methodology [phone]) is advised. The SARS-CoV-2 RNA is generally  detectable in upper and lower respiratory sp ecimens during the acute  phase of infection. The expected result is Negative. Fact Sheet for Patients:  StrictlyIdeas.no Fact Sheet for Healthcare Providers: BankingDealers.co.za This test is not yet approved or cleared by the [place] FDA and has been authorized for detection and/or diagnosis of SARS-CoV-2 by FDA under an Emergency Use Authorization (EUA).  This EUA will remain in effect (meaning this test can be used) for the duration of the COVID-19 declaration under Section 564(b)(1) of the Act, 21 U.S.C. section 360bbb-3(b)(1), unless the authorization is terminated or revoked sooner. Performed at [facility], [address] [place], [place] [address]   Culture, blood (routine x 2)     Status: None (Preliminary result)   Collection Time: 04/02/19  2:09 PM    Specimen: BLOOD  Result Value Ref Range Status   Specimen Description BLOOD RIGHT ANTECUBITAL  Final   Special Requests   Final    BOTTLES DRAWN AEROBIC AND ANAEROBIC Blood Culture results may not be optimal due to an excessive volume of blood received in culture bottles   Culture   Final    NO GROWTH < 24 HOURS Performed at [facility], [address], [place], [place] [address]    Report Status PENDING  Incomplete  Culture, blood (routine x 2)     Status: None (Preliminary result)   Collection Time: 04/02/19  2:19 PM   Specimen: BLOOD  Result Value Ref Range Status   Specimen Description BLOOD BLOOD RIGHT HAND  Final   Special Requests   Final    BOTTLES DRAWN AEROBIC AND ANAEROBIC Blood Culture results may not be optimal due to an inadequate volume of blood received in culture bottles   Culture   Final    NO GROWTH < 24 HOURS Performed at [facility], [address] [place], [place] [address]    Report Status PENDING  Incomplete  MRSA PCR Screening     Status: None   Collection Time: 04/02/19  9:59 PM   Specimen: Nasal Mucosa; Nasopharyngeal  Result Value Ref Range Status   MRSA by PCR NEGATIVE NEGATIVE Final    Comment:        The GeneXpert MRSA Assay (FDA approved for NASAL specimens only), is one component of a comprehensive MRSA colonization surveillance program. It is not intended to diagnose MRSA infection nor to guide or monitor treatment for MRSA infections. Performed at [facility], [address] [place],  [address]     Radiology Reports Dg Chest 2 View  Result Date: 03/15/2019 CLINICAL DATA:  Cough, chest pain EXAM: CHEST - 2 VIEW COMPARISON:  Chest radiograph 02/15/2019 FINDINGS: Stable cardiomegaly. Interval development of patchy consolidative opacities within the  right lower lobe. No pleural effusion or pneumothorax. Thoracic spine degenerative changes. IMPRESSION: Patchy consolidative opacities right lower lung may  represent atelectasis or infection. Cardiomegaly. Electronically Signed   By: Lovey Newcomer M.D.   On: 03/15/2019 20:16   Dg Chest Port 1 View  Result Date: 04/02/2019 CLINICAL DATA:  Recurrent syncopal episodes. Cough. EXAM: PORTABLE CHEST 1 VIEW COMPARISON:  03/31/2019 FINDINGS: Stable cardiomegaly and pulmonary vascular congestion. Aortic atherosclerosis. Interval improvement in right lower lobe pulmonary infiltrate since previous study. Left lung remains clear. No evidence of pleural effusion. Stent again seen in region of left axillary artery. IMPRESSION: Interval improvement in right lower lobe infiltrate since prior exam. Continued radiographic follow-up recommended to confirm resolution. Stable cardiomegaly and pulmonary venous hypertension. Electronically Signed   By: Marlaine Hind M.D.   On: 04/02/2019 13:26   Dg Chest Portable 1 View  Result Date: 03/31/2019 CLINICAL DATA:  Cough and dyspnea EXAM: PORTABLE CHEST 1 VIEW COMPARISON:  03/15/2019 FINDINGS: Worsening right basilar opacity, likely indicating infection. Mild cardiomegaly. No pleural effusion or pneumothorax. IMPRESSION: Worsening right basilar consolidation, likely infection. Electronically Signed   By: Ulyses Jarred M.D.   On: 03/31/2019 01:51     CBC Recent Labs  Lab 03/31/19 0122 04/02/19 1409 04/03/19 0507  WBC 5.4 4.2 3.8*  HGB 10.1* 9.9* 9.4*  HCT 32.0* 30.8* 29.4*  PLT 212 223 215  MCV 95.2 93.3 92.7  MCH 30.1 30.0 29.7  MCHC 31.6 32.1 32.0  RDW 16.3* 16.2* 15.9*  LYMPHSABS  --  0.6*  --   MONOABS  --  0.4  --   EOSABS  --  0.2  --   BASOSABS  --  0.0  --     Chemistries  Recent Labs  Lab 03/31/19 0122 04/02/19 1243 04/03/19 0507  NA 135 132* 136  K 5.9* 6.2* 3.9  CL 100 97* 99  CO2 20* 15* 24  GLUCOSE 118* 109* 61*  BUN 86* 124* 60*  CREATININE 13.39* 16.98* 10.18*  CALCIUM 8.1* 8.8* 8.5*  MG  --  2.8*  --   AST 24 29 26   ALT 11 21 19   ALKPHOS 159* 171* 164*  BILITOT 0.9 0.7 0.6    ------------------------------------------------------------------------------------------------------------------ estimated creatinine clearance is 7.2 mL/min (A) (by C-G formula based on SCr of 10.18 mg/dL (H)). ------------------------------------------------------------------------------------------------------------------ No results for input(s): HGBA1C in the last 72 hours. ------------------------------------------------------------------------------------------------------------------ No results for input(s): CHOL, HDL, LDLCALC, TRIG, CHOLHDL, LDLDIRECT in the last 72 hours. ------------------------------------------------------------------------------------------------------------------ No results for input(s): TSH, T4TOTAL, T3FREE, THYROIDAB in the last 72 hours.  Invalid input(s): FREET3 ------------------------------------------------------------------------------------------------------------------ No results for input(s): VITAMINB12, FOLATE, FERRITIN, TIBC, IRON, RETICCTPCT in the last 72 hours.  Coagulation profile No results for input(s): INR, PROTIME in the last 168 hours.  No results for input(s): DDIMER in the last 72 hours.  Cardiac Enzymes No results for input(s): CKMB, TROPONINI, MYOGLOBIN in the last 168 hours.  Invalid input(s): CK ------------------------------------------------------------------------------------------------------------------ Invalid input(s): [place]   #Hyperkalemia from noncompliance with dialysis-now resolved postdialysis    #Recurrent syncopal episodes cough induced I will try to treat his cough with an steatosis and anti-GERD medication as well as antiallergic medication   #End-stage renal disease on hemodialysis but noncompliance with dialysis Nephrology following  #Recent history of pneumonia with pleuritic chest pain Antitussives and supportive treatment as needed Continue rest of his azithromycin  for 3 more days to complete the course Chest x-ray shows improvement  #Tobacco abuse disorder Counseled  patient to quit smoking for 5 minutes.  Patient verbalized understanding and is agreeable with nicotine patch  All the records are reviewed and case discussed with ED provider. Management plans discussed with the patient, family and they are in agreement.      Code Status Orders  (From admission, onward)         Start     Ordered   04/02/19 1808  Full code  Continuous     04/02/19 1807        Code Status History    Date Active Date Inactive Code Status Order ID Comments User Context   02/14/2019 2128 02/15/2019 0729 Full Code [id number]  Mayer Camel, NP ED   11/23/2018 1231 11/24/2018 1857 Full Code [id number]  Hillary Bow, MD ED   11/21/2018 1857 11/22/2018 2139 Full Code [id number]  Gorden Harms, MD Inpatient   11/20/2018 0122 11/20/2018 1923 Full Code [id number]  Harrie Foreman, MD Inpatient   09/22/2018 1841 09/23/2018 2017 Full Code [id number]  Saundra Shelling, MD Inpatient   05/16/2018 1709 05/19/2018 1433 Full Code [id number]  Vaughan Basta, MD Inpatient   Advance Care Planning Activity           Consults cardiology nephrology  DVT Prophylaxis Heparin  Lab Results  Component Value Date   PLT 215 04/03/2019     Time Spent in minutes   35 minutes greater than 50% of time spent in care coordination and counseling patient regarding the condition and plan of care.   Dustin Flock M.D on 04/03/2019 at 2:06 PM  Between 7am to 6pm - Pager - [phone]  After 6pm go to [email or web address] - Proofreader  [employer]   Office  [phone]

## 2019-04-03 NOTE — Progress Notes (Signed)
Pt decided he would wear the telemetry monitor during the day only. He states he doesn't want to wear it at night. MD aware. Will continue to monitor.

## 2019-04-03 NOTE — Consult Note (Signed)
Cardiology Consultation Note    Patient ID: Jeremy [name], MRN: [id number], DOB/AGE: 02/07/1961 57 y.o. Admit date: 04/02/2019   Date of Consult: 04/03/2019 Primary Physician: System, Pcp Not In Primary Cardiologist: Dr. Clayborn Bigness  Chief Complaint: syncope Reason for Consultation: syncope Requesting MD: Dr. Dustin Flock  HPI: Jeremy [name] is a 58 y.o. male with history of end-stage renal disease on hemodialysis 3 times weekly, hypertension, diabetes with newly diagnosed heart failure with reduced LV function with an echo showing an EF of 30% with very echogenic myocardium.  Due to the abnormal echocardiogram, concern over possible amyloid heart was raised.  He underwent right heart cath and endomyocardial biopsy at [facility] last week.  This revealed a cardiac index of 2.9 L/min/m with right ventricular systolic pressure of 88 mmHg pulmonary artery mean pressure of 55 mmHg wedge pressure of 26 mmHg with a PVR of 5.8 Wood units.  Endomyocardial biopsy thus far has revealed no evidence of amyloid on routine and [place] red stains.  There was no evident sarcoid, myocarditis or ischemic infarct.  Electromicroscopy is pending.  Echocardiogram done at [facility] on second 2020 revealed ejection fraction of 30% with moderate MR moderate TR trivial AI.  And showed echogenicity consistent with possible infiltrative disease.  Estimated RV systolic pressure by echo was 56.3 mmHg.  Patient has a several month history of syncope.  On discussion with the patient, he states these occurred during coughing episodes.  He states that when he has he had extensive coughing paroxysms, he often has a syncopal episode.  He denies any syncopal episodes other than during this time.  He states that he missed 2 episodes hemodialysis in the last week.  He presented to the emergency room for the syncopal episodes.  Laboratories on presentation revealed high-sensitivity troponin of 116 consistent with  renal insufficiency.  Serum potassium was 6.2 on presentation with a creatinine of 16.98.  BUN was 124.  He underwent hemodialysis yesterday.  He continues to complain of diffuse pain.  He also had his telemetry removed last night due to stating that he is not able to sleep with it on.  He denies production with his cough.  He is seen pulmonary for his chronic cough and shortness of breath.  He has been treated as an outpatient with amlodipine at 10 mg daily, gabapentin and albuterol.  He had been on Entresto in the past.  He currently is hemodynamically stable with mean arterial pressure of 92 mmHg.  Pulse rate is 81.  Pulse ox on room air is 100.  Past Medical History:  Diagnosis Date  . Bronchitis   . Hypertension   . Renal disorder    kidney failure, dialysis      Surgical History:  Past Surgical History:  Procedure Laterality Date  . DIALYSIS FISTULA CREATION       Home Meds: Prior to Admission medications   Medication Sig Start Date End Date Taking? Authorizing Provider  albuterol (PROVENTIL HFA;VENTOLIN HFA) 108 (90 Base) MCG/ACT inhaler Inhale 2 puffs into the lungs every 6 (six) hours as needed for wheezing or shortness of breath. 11/19/18  Yes Nena Polio, MD  azithromycin (ZITHROMAX Z-PAK) 250 MG tablet Take 2 tablets (500 mg) on  Day 1,  followed by 1 tablet (250 mg) once daily on Days 2 through 5. 03/31/19 04/05/19 Yes Gregor Hams, MD  calcium acetate (PHOSLO) 667 MG capsule Take 667 mg by mouth 3 (three) times daily. 10/20/13  Yes [provider]  cinacalcet (SENSIPAR) 30 MG tablet Take 30 mg by mouth 3 (three) times a week. 09/04/15  Yes [provider]  gabapentin (NEURONTIN) 100 MG capsule Take 1 capsule (100 mg total) by mouth daily as needed (pain). Patient taking differently: Take 300 mg by mouth daily.  11/22/18  Yes Bettey Costa, MD  Multiple Vitamin (MULTIVITAMIN WITH MINERALS) TABS tablet Take 1 tablet by mouth daily.   Yes [provider]  sacubitril-valsartan (ENTRESTO) 24-26 MG Take 1 tablet by mouth 2 (two) times a day. 03/03/19  Yes [provider]  sevelamer carbonate (RENVELA) 800 MG tablet TK 3 TS PO TID WC 02/25/19  Yes [provider]  umeclidinium-vilanterol (ANORO ELLIPTA) 62.5-25 MCG/INH AEPB Inhale 1 puff into the lungs daily. 02/02/19  Yes [provider]    Inpatient Medications:  . azithromycin  250 mg Oral Daily  . calcium acetate  667 mg Oral TID  . Chlorhexidine Gluconate Cloth  6 each Topical Q0600  . [START ON 04/04/2019] cinacalcet  30 mg Oral 3 times weekly  . heparin injection (subcutaneous)  5,000 Units Subcutaneous Q8H  . multivitamin with minerals  1 tablet Oral Daily  . nicotine  21 mg Transdermal Daily  . sevelamer carbonate  800 mg Oral TID WC  . umeclidinium-vilanterol  1 puff Inhalation Daily   . sodium chloride    . sodium chloride      Allergies: No Known Allergies  Social History   Socioeconomic History  . Marital status: Single    Spouse name: Not on file  . Number of children: Not on file  . Years of education: Not on file  . Highest education level: Not on file  Occupational History  . Not on file  Social Needs  . Financial resource strain: Not on file  . Food insecurity    Worry: Not on file    Inability: Not on file  . Transportation needs    Medical: Not on file    Non-medical: Not on file  Tobacco Use  . Smoking status: Current Every Day Smoker    Packs/day: 1.00    Types: Cigarettes  . Smokeless tobacco: Never Used  Substance and Sexual Activity  . Alcohol use: Never    Frequency: Never  . Drug use: Yes    Types: Marijuana  . Sexual activity: Not on file  Lifestyle  . Physical activity    Days per week: Not on file    Minutes per session: Not on file  . Stress: Not on file  Relationships  . Social Herbalist on phone: Not on file    Gets together: Not on file    Attends religious service: Not on file    Active  member of club or organization: Not on file    Attends meetings of clubs or organizations: Not on file    Relationship status: Not on file  . Intimate partner violence    Fear of current or ex partner: Not on file    Emotionally abused: Not on file    Physically abused: Not on file    Forced sexual activity: Not on file  Other Topics Concern  . Not on file  Social History Narrative  . Not on file     Family History  Problem Relation Age of Onset  . Hypertension Mother      Review of Systems: A 12-system review of systems was performed and is negative except  as noted in the HPI.  Labs: No results for input(s): CKTOTAL, CKMB, TROPONINI in the last 72 hours. Lab Results  Component Value Date   WBC 3.8 (L) 04/03/2019   HGB 9.4 (L) 04/03/2019   HCT 29.4 (L) 04/03/2019   MCV 92.7 04/03/2019   PLT 215 04/03/2019    Recent Labs  Lab 04/03/19 0507  NA 136  K 3.9  CL 99  CO2 24  BUN 60*  CREATININE 10.18*  CALCIUM 8.5*  PROT 6.7  BILITOT 0.6  ALKPHOS 164*  ALT 19  AST 26  GLUCOSE 61*   No results found for: CHOL, HDL, LDLCALC, TRIG No results found for: DDIMER  Radiology/Studies:  Dg Chest 2 View  Result Date: 03/15/2019 CLINICAL DATA:  Cough, chest pain EXAM: CHEST - 2 VIEW COMPARISON:  Chest radiograph 02/15/2019 FINDINGS: Stable cardiomegaly. Interval development of patchy consolidative opacities within the right lower lobe. No pleural effusion or pneumothorax. Thoracic spine degenerative changes. IMPRESSION: Patchy consolidative opacities right lower lung may represent atelectasis or infection. Cardiomegaly. Electronically Signed   By: Lovey Newcomer M.D.   On: 03/15/2019 20:16   Dg Chest Port 1 View  Result Date: 04/02/2019 CLINICAL DATA:  Recurrent syncopal episodes. Cough. EXAM: PORTABLE CHEST 1 VIEW COMPARISON:  03/31/2019 FINDINGS: Stable cardiomegaly and pulmonary vascular congestion. Aortic atherosclerosis. Interval improvement in right lower lobe pulmonary  infiltrate since previous study. Left lung remains clear. No evidence of pleural effusion. Stent again seen in region of left axillary artery. IMPRESSION: Interval improvement in right lower lobe infiltrate since prior exam. Continued radiographic follow-up recommended to confirm resolution. Stable cardiomegaly and pulmonary venous hypertension. Electronically Signed   By: Marlaine Hind M.D.   On: 04/02/2019 13:26   Dg Chest Portable 1 View  Result Date: 03/31/2019 CLINICAL DATA:  Cough and dyspnea EXAM: PORTABLE CHEST 1 VIEW COMPARISON:  03/15/2019 FINDINGS: Worsening right basilar opacity, likely indicating infection. Mild cardiomegaly. No pleural effusion or pneumothorax. IMPRESSION: Worsening right basilar consolidation, likely infection. Electronically Signed   By: Ulyses Jarred M.D.   On: 03/31/2019 01:51    Wt Readings from Last 3 Encounters:  04/02/19 65.3 kg  03/31/19 67.1 kg  02/15/19 67 kg    EKG: Normal sinus rhythm with LVH.   Physical Exam:  Blood pressure 138/74, pulse 81, temperature 97.9 F (36.6 C), temperature source Oral, resp. rate 17, height 5\' 6"  (1.676 m), weight 65.3 kg, SpO2 100 %. Body mass index is 23.23 kg/m. General: Well developed, well nourished, in no acute distress. Head: Normocephalic, atraumatic, sclera non-icteric, no xanthomas, nares are without discharge.  Neck: Negative for carotid bruits. JVD not elevated. Lungs: Clear bilaterally to auscultation without wheezes, rales, or rhonchi. Breathing is unlabored. Heart: RRR with S1 S2. No murmurs, rubs, or gallops appreciated. Abdomen: Soft, non-tender, non-distended with normoactive bowel sounds. No hepatomegaly. No rebound/guarding. No obvious abdominal masses. Msk:  Strength and tone appear normal for age. Extremities: No clubbing or cyanosis. No edema.  Distal pedal pulses are 2+ and equal bilaterally. Neuro: Alert and oriented X 3. No facial asymmetry. No focal deficit. Moves all extremities  spontaneously. Psych:  Responds to questions appropriately with a normal affect.     Assessment and Plan  58 year old male with end-stage renal disease on hemodialysis with history of newly diagnosed cardiomyopathy with concern over possible amyloid heart due to echogenicity.  He has LVH however does have increased voltage on his electrocardiogram which is consistent with LVH and not amyloid.  He was  evaluated [facility] with a right heart cath and endomyocardial biopsy last week.  Biopsy thus far has been negative for sarcoid/amyloid.  Further evaluations are undergoing.  He missed dialysis x2 last week.  In the emergency room he presented with complaints of recurrent syncopal episodes.  He was admitted for this.  Episodes of syncope appear to occur with coughing.  On close questioning, he has not stated that he had any syncope outside of the coughing episodes.  He was on telemetry transiently during this hospitalization with no arrhythmias.  This was self discontinued due to the inability to sleep with it on.  He dialyzed yesterday and feels somewhat better.  His elevated high-sensitivity troponin is consistent with his heart failure and renal insufficiency.  No obvious dysrhythmia noted.  He had an echocardiogram 1 month ago showing an EF of 30% with moderate MR moderate TR and elevated right-sided pressures.  He is noted to have significant right-sided pressures by right heart cath.  Etiology of syncope appears to be cough syncope.  He has seen pulmonary.  He is on no drugs that consistently would cause a chronic cough.  The cough syncope occurs with prolonged paroxysms of cough.  Does not appear to require any further cardiac work-up during this hospitalization.  Would discuss with nephrology regarding further dialysis.  Okay for discharge from a cardiac standpoint with recommendation for outpatient Holter monitoring.  This can be placed at [facility] clinic tomorrow morning.  Would  continue with current medications for now.  Compliance with dialysis is recommended.  Smoking cessation is recommended.  Signed, Teodoro Spray MD 04/03/2019, 9:51 AM Pager: [phone]

## 2019-04-04 MED ORDER — TRAZODONE HCL 50 MG PO TABS
50.0000 mg | ORAL_TABLET | Freq: Every day | ORAL | Status: DC
  Administered 2019-04-04: 50 mg via ORAL
  Filled 2019-04-04: qty 1

## 2019-04-04 MED ORDER — PANTOPRAZOLE SODIUM 40 MG PO TBEC: 40.0000 mg | DELAYED_RELEASE_TABLET | Freq: Every day | ORAL | 0 refills | Status: DC

## 2019-04-04 MED ORDER — BENZONATATE 200 MG PO CAPS: 200.0000 mg | ORAL_CAPSULE | Freq: Three times a day (TID) | ORAL | 0 refills | Status: DC

## 2019-04-04 MED ORDER — FLUTICASONE PROPIONATE 50 MCG/ACT NA SUSP: 2.0000 | Freq: Every day | NASAL | 2 refills | Status: DC

## 2019-04-04 MED ORDER — NITROGLYCERIN 2 % TD OINT
0.5000 [in_us] | TOPICAL_OINTMENT | Freq: Four times a day (QID) | TRANSDERMAL | Status: DC
  Administered 2019-04-04 (×2): 0.5 [in_us] via TOPICAL
  Filled 2019-04-04: qty 1

## 2019-04-04 MED ORDER — GUAIFENESIN-CODEINE 100-10 MG/5ML PO SOLN: 10.0000 mL | ORAL | 0 refills | Status: DC | PRN

## 2019-04-04 NOTE — Discharge Summary (Signed)
[employer] [facility] [place] at [facility], 58 y.o., DOB November 26, 1960, MRN [id number]. Admission date: 04/02/2019 Discharge Date 04/04/2019 Primary MD System, Pcp Not In [place] Gouru, MD  Admission Diagnosis  Hyperkalemia [E87.5] Syncope [R55] End stage kidney disease [facility]) [N18.6]  Discharge Diagnosis   Active Problems: Hyperkalemia due to patient missing hemodialysis Cough induced syncope End-stage renal disease Recent history of pneumonia with pleuritic chest pain Tobacco abuse Abnormal echo work-up is being done as outpatient  Hospital Course  Rayn Enderson  is a 58 y.o. male with a known history of hypertension, bronchitis, end-stage renal disease on hemodialysis but missed his hemodialysis 2 times so far is presenting to the ED after he passed out multiple times in the past several days but shortness of breath prompted him to come to the ED .  Potassium at 6.2.  Patient was given stat calcium gluconate IV, dextrose and NovoLog IV.  Veltassa was also given.  EKG with no acute ST-T wave changes.  Patient had urgent hemodialysis with normalization of his potassium.  Patient continued to complain of syncope with coughing.  Work-up included a recent echocardiogram of the heart.  He had carotid Doppler which showed carotid arthrosclerosis but no significant stenosis.  Patient was seen by nephrology and cardiology.  Patient syncope is followed by coughing.  I have attempted to treat his cough with various medications to see if this will help.  I also referred him to pulmonary.  Patient also on Entresto which can also cause cough.  This needs to be considered to be stopped if his cardiologist feels appropriate as outpatient.  I have told the patient that when he starts coughing he would needs to sit down or lay down to prevent any type of fall or injury.      Consults  cardiology  Significant Tests:  See full reports for all details     Dg Chest 2  View  Result Date: 03/15/2019 CLINICAL DATA:  Cough, chest pain EXAM: CHEST - 2 VIEW COMPARISON:  Chest radiograph 02/15/2019 FINDINGS: Stable cardiomegaly. Interval development of patchy consolidative opacities within the right lower lobe. No pleural effusion or pneumothorax. Thoracic spine degenerative changes. IMPRESSION: Patchy consolidative opacities right lower lung may represent atelectasis or infection. Cardiomegaly. Electronically Signed   By: Lovey Newcomer M.D.   On: 03/15/2019 20:16   US Carotid Bilateral  Result Date: 04/03/2019 CLINICAL DATA:  Syncopal episode. History of hypertension and smoking. History of end-stage renal disease, on dialysis. EXAM: BILATERAL CAROTID DUPLEX ULTRASOUND TECHNIQUE: Pearline Cables scale imaging, color Doppler and duplex ultrasound were performed of bilateral carotid and vertebral arteries in the neck. COMPARISON:  None. FINDINGS: Criteria: Quantification of carotid stenosis is based on velocity parameters that correlate the residual internal carotid diameter with NASCET-based stenosis levels, using the diameter of the distal internal carotid lumen as the denominator for stenosis measurement. The following velocity measurements were obtained: RIGHT ICA: 73/21 cm/sec CCA: 63/01 cm/sec SYSTOLIC ICA/CCA RATIO:  1.0 ECA: 82 cm/sec LEFT ICA: 79/24 cm/sec CCA: 601/09 cm/sec SYSTOLIC ICA/CCA RATIO:  0.6 ECA: 97 cm/sec RIGHT CAROTID ARTERY: There is a minimal amount of eccentric echogenic plaque involving the mid aspect of the right common carotid artery (image 7). There is a minimal amount of eccentric echogenic plaque within the right carotid bulb (image 15). There is a minimal to moderate amount of eccentric echogenic partially shadowing plaque involving the origin and proximal aspects of the right internal carotid artery (image 22), not  resulting in elevated peak systolic velocities within the interrogated course of the right internal carotid artery to suggest a hemodynamically  significant stenosis. RIGHT VERTEBRAL ARTERY:  Antegrade Flow LEFT CAROTID ARTERY: There is a minimal amount of eccentric echogenic plaque involving the mid aspect of the left internal carotid artery (image 40). There is a minimal amount of eccentric echogenic plaque within the left carotid bulb (image 48). There is a moderate amount of eccentric partially shadowing plaque involving the proximal and mid aspects of the left internal carotid artery (image 58), not resulting in elevated peak systolic velocities within the interrogated course of the left internal carotid artery to suggest a hemodynamically significant stenosis LEFT VERTEBRAL ARTERY:  Antegrade Flow IMPRESSION: Moderate amount of bilateral atherosclerotic plaque, not resulting in a hemodynamically significant stenosis within either internal carotid artery. Electronically Signed   By: Sandi Mariscal M.D.   On: 04/03/2019 15:19   Dg Chest Port 1 View  Result Date: 04/02/2019 CLINICAL DATA:  Recurrent syncopal episodes. Cough. EXAM: PORTABLE CHEST 1 VIEW COMPARISON:  03/31/2019 FINDINGS: Stable cardiomegaly and pulmonary vascular congestion. Aortic atherosclerosis. Interval improvement in right lower lobe pulmonary infiltrate since previous study. Left lung remains clear. No evidence of pleural effusion. Stent again seen in region of left axillary artery. IMPRESSION: Interval improvement in right lower lobe infiltrate since prior exam. Continued radiographic follow-up recommended to confirm resolution. Stable cardiomegaly and pulmonary venous hypertension. Electronically Signed   By: Marlaine Hind M.D.   On: 04/02/2019 13:26   Dg Chest Portable 1 View  Result Date: 03/31/2019 CLINICAL DATA:  Cough and dyspnea EXAM: PORTABLE CHEST 1 VIEW COMPARISON:  03/15/2019 FINDINGS: Worsening right basilar opacity, likely indicating infection. Mild cardiomegaly. No pleural effusion or pneumothorax. IMPRESSION: Worsening right basilar consolidation, likely infection.  Electronically Signed   By: Ulyses Jarred M.D.   On: 03/31/2019 01:51       Today   Subjective:   Jeremy [name] patient feeling well denies any complaints no syncope since yesterday Objective:   Blood pressure (!) 142/75, pulse 79, temperature 98 F (36.7 C), temperature source Oral, resp. rate 19, height 5\' 6"  (1.676 m), weight 65.6 kg, SpO2 94 %.  .  Intake/Output Summary (Last 24 hours) at 04/04/2019 1521 Last data filed at 04/04/2019 1036 Gross per 24 hour  Intake 240 ml  Output 0 ml  Net 240 ml    Exam VITAL SIGNS: Blood pressure (!) 142/75, pulse 79, temperature 98 F (36.7 C), temperature source Oral, resp. rate 19, height 5\' 6"  (1.676 m), weight 65.6 kg, SpO2 94 %.  GENERAL:  58 y.o.-year-old patient lying in the bed with no acute distress.  EYES: Pupils equal, round, reactive to light and accommodation. No scleral icterus. Extraocular muscles intact.  HEENT: Head atraumatic, normocephalic. Oropharynx and nasopharynx clear.  NECK:  Supple, no jugular venous distention. No thyroid enlargement, no tenderness.  LUNGS: Normal breath sounds bilaterally, no wheezing, rales,rhonchi or crepitation. No use of accessory muscles of respiration.  CARDIOVASCULAR: S1, S2 normal. No murmurs, rubs, or gallops.  ABDOMEN: Soft, nontender, nondistended. Bowel sounds present. No organomegaly or mass.  EXTREMITIES: No pedal edema, cyanosis, or clubbing.  NEUROLOGIC: Cranial nerves II through XII are intact. Muscle strength 5/5 in all extremities. Sensation intact. Gait not checked.  PSYCHIATRIC: The patient is alert and oriented x 3.  SKIN: No obvious rash, lesion, or ulcer.   Data Review     CBC w Diff:  Lab Results  Component Value Date   WBC  3.8 (L) 04/03/2019   HGB 9.4 (L) 04/03/2019   HCT 29.4 (L) 04/03/2019   PLT 215 04/03/2019   LYMPHOPCT 14 04/02/2019   MONOPCT 10 04/02/2019   EOSPCT 4 04/02/2019   BASOPCT 1 04/02/2019   CMP:  Lab Results  Component Value Date   NA 136  04/03/2019   K 3.9 04/03/2019   CL 99 04/03/2019   CO2 24 04/03/2019   BUN 60 (H) 04/03/2019   CREATININE 10.18 (H) 04/03/2019   PROT 6.7 04/03/2019   ALBUMIN 3.4 (L) 04/03/2019   BILITOT 0.6 04/03/2019   ALKPHOS 164 (H) 04/03/2019   AST 26 04/03/2019   ALT 19 04/03/2019  .  Micro Results Recent Results (from the past 240 hour(s))  SARS Coronavirus 2 (CEPHEID - Performed in [place] hospital lab), Hosp Order     Status: None   Collection Time: 04/02/19  1:45 PM   Specimen: Nasopharyngeal Swab  Result Value Ref Range Status   SARS Coronavirus 2 NEGATIVE NEGATIVE Final    Comment: (NOTE) If result is NEGATIVE SARS-CoV-2 target nucleic acids are NOT DETECTED. The SARS-CoV-2 RNA is generally detectable in upper and lower  respiratory specimens during the acute phase of infection. The lowest  concentration of SARS-CoV-2 viral copies this assay can detect is 250  copies / mL. A negative result does not preclude SARS-CoV-2 infection  and should not be used as the sole basis for treatment or other  patient management decisions.  A negative result may occur with  improper specimen collection / handling, submission of specimen other  than nasopharyngeal swab, presence of viral mutation(s) within the  areas targeted by this assay, and inadequate number of viral copies  (<250 copies / mL). A negative result must be combined with clinical  observations, patient history, and epidemiological information. If result is POSITIVE SARS-CoV-2 target nucleic acids are DETECTED. The SARS-CoV-2 RNA is generally detectable in upper and lower  respiratory specimens dur ing the acute phase of infection.  Positive  results are indicative of active infection with SARS-CoV-2.  Clinical  correlation with patient history and other diagnostic information is  necessary to determine patient infection status.  Positive results do  not rule out bacterial infection or co-infection with other viruses. If  result is PRESUMPTIVE POSTIVE SARS-CoV-2 nucleic acids MAY BE PRESENT.   A presumptive positive result was obtained on the submitted specimen  and confirmed on repeat testing.  While 2019 novel coronavirus  (SARS-CoV-2) nucleic acids may be present in the submitted sample  additional confirmatory testing may be necessary for epidemiological  and / or clinical management purposes  to differentiate between  SARS-CoV-2 and other Sarbecovirus currently known to infect humans.  If clinically indicated additional testing with an alternate test  methodology [phone]) is advised. The SARS-CoV-2 RNA is generally  detectable in upper and lower respiratory sp ecimens during the acute  phase of infection. The expected result is Negative. Fact Sheet for Patients:  StrictlyIdeas.no Fact Sheet for Healthcare Providers: BankingDealers.co.za This test is not yet approved or cleared by the [place] FDA and has been authorized for detection and/or diagnosis of SARS-CoV-2 by FDA under an Emergency Use Authorization (EUA).  This EUA will remain in effect (meaning this test can be used) for the duration of the COVID-19 declaration under Section 564(b)(1) of the Act, 21 U.S.C. section 360bbb-3(b)(1), unless the authorization is terminated or revoked sooner. Performed at [facility], [address] [place], [place] [address]   Culture,  blood (routine x 2)     Status: None (Preliminary result)   Collection Time: 04/02/19  2:09 PM   Specimen: BLOOD  Result Value Ref Range Status   Specimen Description BLOOD RIGHT ANTECUBITAL  Final   Special Requests   Final    BOTTLES DRAWN AEROBIC AND ANAEROBIC Blood Culture results may not be optimal due to an excessive volume of blood received in culture bottles   Culture   Final    NO GROWTH 2 DAYS Performed at [facility], [address] [place], [place] [address]    Report Status PENDING   Incomplete  Culture, blood (routine x 2)     Status: None (Preliminary result)   Collection Time: 04/02/19  2:19 PM   Specimen: BLOOD  Result Value Ref Range Status   Specimen Description BLOOD BLOOD RIGHT HAND  Final   Special Requests   Final    BOTTLES DRAWN AEROBIC AND ANAEROBIC Blood Culture results may not be optimal due to an inadequate volume of blood received in culture bottles   Culture   Final    NO GROWTH 2 DAYS Performed at [facility], [address] [place], [place] [address]    Report Status PENDING  Incomplete  MRSA PCR Screening     Status: None   Collection Time: 04/02/19  9:59 PM   Specimen: Nasal Mucosa; Nasopharyngeal  Result Value Ref Range Status   MRSA by PCR NEGATIVE NEGATIVE Final    Comment:        The GeneXpert MRSA Assay (FDA approved for NASAL specimens only), is one component of a comprehensive MRSA colonization surveillance program. It is not intended to diagnose MRSA infection nor to guide or monitor treatment for MRSA infections. Performed at [facility], [address] [place], [place] [address]         Code Status Orders  (From admission, onward)         Start     Ordered   04/02/19 1808  Full code  Continuous     04/02/19 1807        Code Status History    Date Active Date Inactive Code Status Order ID Comments User Context   02/14/2019 2128 02/15/2019 0729 Full Code [id number]  Mayer Camel, NP ED   11/23/2018 1231 11/24/2018 1857 Full Code [id number]  Hillary Bow, MD ED   11/21/2018 1857 11/22/2018 2139 Full Code [id number]  Gorden Harms, MD Inpatient   11/20/2018 0122 11/20/2018 1923 Full Code [id number]  Harrie Foreman, MD Inpatient   09/22/2018 1841 09/23/2018 2017 Full Code [id number]  Saundra Shelling, MD Inpatient   05/16/2018 1709 05/19/2018 1433 Full Code [id number]  Vaughan Basta, MD Inpatient   Advance Care Planning Activity          Follow-up Information    pcp In 6 days.   Why:  hosp f/u       Callwood, Dwayne D, MD. Schedule an appointment as soon as possible for a visit in 1 week.   Specialties: Cardiology, Internal Medicine Why: hosp f/u Contact information: [place] [address] [phone]        Erby Pian, MD On 04/12/2019.   Specialty: Specialist Why: as new patient for cough and copd eval...Marland KitchenMarland Kitchenappointmemt at 11:30am Contact information: [place] [address] [phone]           Discharge Medications   Allergies as of 04/04/2019   No Known Allergies  Medication List    TAKE these medications   albuterol 108 (90 Base) MCG/ACT inhaler Commonly known as: VENTOLIN HFA Inhale 2 puffs into the lungs every 6 (six) hours as needed for wheezing or shortness of breath.   azithromycin 250 MG tablet Commonly known as: Zithromax Z-Pak Take 2 tablets (500 mg) on  Day 1,  followed by 1 tablet (250 mg) once daily on Days 2 through 5.   benzonatate 200 MG capsule Commonly known as: TESSALON Take 1 capsule (200 mg total) by mouth 3 (three) times daily.   calcium acetate 667 MG capsule Commonly known as: PHOSLO Take 667 mg by mouth 3 (three) times daily.   cinacalcet 30 MG tablet Commonly known as: SENSIPAR Take 30 mg by mouth 3 (three) times a week.   fluticasone 50 MCG/ACT nasal spray Commonly known as: FLONASE Place 2 sprays into both nostrils daily. Start taking on: April 05, 2019   gabapentin 100 MG capsule Commonly known as: NEURONTIN Take 1 capsule (100 mg total) by mouth daily as needed (pain). What changed:   how much to take  when to take this   guaiFENesin-codeine 100-10 MG/5ML syrup Take 10 mLs by mouth every 4 (four) hours as needed for cough.   multivitamin with minerals Tabs tablet Take 1 tablet by mouth daily.   pantoprazole 40 MG tablet Commonly known as: PROTONIX Take 1 tablet (40 mg total) by mouth daily. Start taking on: April 05, 2019    sacubitril-valsartan 24-26 MG Commonly known as: ENTRESTO Take 1 tablet by mouth 2 (two) times a day.   sevelamer carbonate 800 MG tablet Commonly known as: RENVELA TK 3 TS PO TID WC   umeclidinium-vilanterol 62.5-25 MCG/INH Aepb Commonly known as: ANORO ELLIPTA Inhale 1 puff into the lungs daily.          Total Time in preparing paper work, data evaluation and todays exam - 19 minutes  Dustin Flock M.D on 04/04/2019 at [place]  [phone]

## 2019-04-04 NOTE — TOC Transition Note (Signed)
Transition of Care [facility]) - CM/SW Discharge Note   Patient Details  Name: Jeremy [name] MRN: [id number] Date of Birth: June 05, 1961  [facility] CM/SW Contact:  Shade Flood, LCSW Phone Number: 04/04/2019, 11:43 AM   Clinical Narrative:     Pt will dc home today per MD. There are no TOC needs identified for dc. Encouraged pt to keep follow up appointments.   Final next level of care: Home/Self Care Barriers to Discharge: Barriers Resolved   Patient Goals and CMS Choice   CMS Medicare.gov Compare Post Acute Care list provided to:: Patient Choice offered to / list presented to : Patient  Discharge Placement                       Discharge Plan and Services   Discharge Planning Services: CM Consult                                 Social Determinants of Health (SDOH) Interventions     Readmission Risk Interventions Readmission Risk Prevention Plan 04/04/2019 04/03/2019 11/24/2018  Transportation Screening - Complete Complete  Medication Review Press photographer) - Complete Complete  PCP or Specialist appointment within 3-5 days of discharge Complete - Complete  HRI or [place] - - (No Data)  SW Recovery Care/Counseling Consult Complete - (No Data)  Palliative Care Screening - Not Applicable Not Applicable  Skilled Nursing Facility - Not Applicable Not Applicable  Some recent data might be hidden

## 2019-04-04 NOTE — Progress Notes (Signed)
Pt discharged home via private car at 1240. Pt was A&Ox4. No c/o pain. VSS. AVS reviewed with pt and all questions were answered. Pt verbalized understanding of discharge instructions. Pt left with clothes, glasses, cell phone, charger, and headphones. Pt wheeled downstairs by CNA.

## 2019-04-07 LAB — CULTURE, BLOOD (ROUTINE X 2)
Culture: NO GROWTH
Culture: NO GROWTH

## 2019-04-13 ENCOUNTER — Other Ambulatory Visit: Payer: Self-pay

## 2019-04-13 ENCOUNTER — Encounter: Payer: Self-pay | Admitting: Emergency Medicine

## 2019-04-13 ENCOUNTER — Emergency Department
Admission: EM | Admit: 2019-04-13 | Discharge: 2019-04-13 | Disposition: A | Discharge status: Home / Self Care | Payer: Medicare Other | Attending: Emergency Medicine | Admitting: Emergency Medicine

## 2019-04-13 DIAGNOSIS — I110 Hypertensive heart disease with heart failure: Secondary | ICD-10-CM | POA: Diagnosis not present

## 2019-04-13 DIAGNOSIS — F17210 Nicotine dependence, cigarettes, uncomplicated: Secondary | ICD-10-CM | POA: Diagnosis not present

## 2019-04-13 DIAGNOSIS — I509 Heart failure, unspecified: Secondary | ICD-10-CM | POA: Insufficient documentation

## 2019-04-13 DIAGNOSIS — Z79899 Other long term (current) drug therapy: Secondary | ICD-10-CM | POA: Diagnosis not present

## 2019-04-13 DIAGNOSIS — R21 Rash and other nonspecific skin eruption: Secondary | ICD-10-CM | POA: Diagnosis present

## 2019-04-13 MED ORDER — CEPHALEXIN 250 MG PO CAPS: 250.0000 mg | ORAL_CAPSULE | Freq: Three times a day (TID) | ORAL | 0 refills | Status: AC

## 2019-04-13 NOTE — ED Triage Notes (Signed)
FIRST NURSE NOTE-here for rash that he has had for months. States "I told my primary but all she does is talk on the phone".  NAD

## 2019-04-13 NOTE — ED Notes (Signed)
Pt to the er for a rash all over his body. Pt states that this started months ago. Pt says they start out as small bumps, he scratches them but they don't itch and then they become open sores. There are areas of open sores with no bleeding or drainage at some points and then what remains is darkened spots to the skin. Pt is a dialysis pt.

## 2019-04-13 NOTE — Discharge Instructions (Addendum)
You are being treated for a non-specific skin rash. You will take the antibiotic as directed for the follicular rash to the trunk. You MUST avoid skin-picking and scratching, or you may cause a worsening skin infection. Use the surgical soap twice a week as directed. Follow-up with your provider or return as needed.

## 2019-04-13 NOTE — ED Triage Notes (Addendum)
Pt to triage via w/c, mask in place; pt reports generalized itchy rash to body for 5 months; denies seeking care for such, st "just got tired of it and I seen on TV that the Norris will cause rashes on your body so I got scared"; pt indicates multiple dark circular scarring to legs and upper arms

## 2019-04-14 NOTE — ED Provider Notes (Signed)
[facility] Emergency Department Provider Note ____________________________________________  Time seen: 2131  I have reviewed the triage vital signs and the nursing notes.  HISTORY  Chief Complaint  Rash  HPI Aysen Piechowski is a 58 y.o. male with the below history including CHF and chronic kidney disease on dialysis, presents to the ED for evaluation of a rash to the trunk and extremities.  Patient has noted small bumps to his arm and trunk intermittently over the last 5 months.  He spoke to his primary provider via telephone today, and has appointment scheduled for tomorrow for evaluation of the same rash.  He presents today for evaluation of her symptoms.  He denies any contacts, travel, or other exposures.  He denies any itching to the lesions, but admits to scratching and picking the bumps regularly, and causing small sores on his skin.  He denies any fevers, chills, or sweats.  Past Medical History:  Diagnosis Date  . Bronchitis   . Hypertension   . Renal disorder    kidney failure, dialysis    Patient Active Problem List   Diagnosis Date Noted  . Acute CHF (congestive heart failure) ([place]) 02/14/2019  . Acute encephalopathy 11/23/2018  . Influenza B 11/21/2018  . Pulmonary edema 09/22/2018  . Hyperkalemia 05/16/2018    Past Surgical History:  Procedure Laterality Date  . DIALYSIS FISTULA CREATION      Prior to Admission medications   Medication Sig Start Date End Date Taking? Authorizing Provider  albuterol (PROVENTIL HFA;VENTOLIN HFA) 108 (90 Base) MCG/ACT inhaler Inhale 2 puffs into the lungs every 6 (six) hours as needed for wheezing or shortness of breath. 11/19/18   Nena Polio, MD  benzonatate (TESSALON) 200 MG capsule Take 1 capsule (200 mg total) by mouth 3 (three) times daily. 04/04/19   Dustin Flock, MD  calcium acetate (PHOSLO) 667 MG capsule Take 667 mg by mouth 3 (three) times daily. 10/20/13   [provider]  cephALEXin  (KEFLEX) 250 MG capsule Take 1 capsule (250 mg total) by mouth 3 (three) times daily for 7 days. 04/13/19 04/20/19  Jakarri Lesko, Dannielle Karvonen, PA-C  cinacalcet (SENSIPAR) 30 MG tablet Take 30 mg by mouth 3 (three) times a week. 09/04/15   [provider]  fluticasone (FLONASE) 50 MCG/ACT nasal spray Place 2 sprays into both nostrils daily. 04/05/19   Dustin Flock, MD  gabapentin (NEURONTIN) 100 MG capsule Take 1 capsule (100 mg total) by mouth daily as needed (pain). Patient taking differently: Take 300 mg by mouth daily.  11/22/18   Bettey Costa, MD  guaiFENesin-codeine 100-10 MG/5ML syrup Take 10 mLs by mouth every 4 (four) hours as needed for cough. 04/04/19   Dustin Flock, MD  Multiple Vitamin (MULTIVITAMIN WITH MINERALS) TABS tablet Take 1 tablet by mouth daily.    [provider]  pantoprazole (PROTONIX) 40 MG tablet Take 1 tablet (40 mg total) by mouth daily. 04/05/19   Dustin Flock, MD  sacubitril-valsartan (ENTRESTO) 24-26 MG Take 1 tablet by mouth 2 (two) times a day. 03/03/19   [provider]  sevelamer carbonate (RENVELA) 800 MG tablet TK 3 TS PO TID WC 02/25/19   [provider]  umeclidinium-vilanterol (ANORO ELLIPTA) 62.5-25 MCG/INH AEPB Inhale 1 puff into the lungs daily. 02/02/19   [provider]    Allergies Patient has no known allergies.  Family History  Problem Relation Age of Onset  . Hypertension Mother     Social History Social History  Tobacco Use  . Smoking status: Current Every Day Smoker    Packs/day: 1.00    Types: Cigarettes  . Smokeless tobacco: Never Used  Substance Use Topics  . Alcohol use: Never    Frequency: Never  . Drug use: Yes    Types: Marijuana    Review of Systems  Constitutional: Negative for fever. Eyes: Negative for visual changes. ENT: Negative for sore throat. Cardiovascular: Negative for chest pain. Respiratory: Negative for shortness of breath. Gastrointestinal: Negative for  abdominal pain, vomiting and diarrhea. Genitourinary: Negative for dysuria. Musculoskeletal: Negative for back pain. Skin: Positive for rash. Neurological: Negative for headaches, focal weakness or numbness. ____________________________________________  PHYSICAL EXAM:  VITAL SIGNS: ED Triage Vitals  Enc Vitals Group     BP 04/13/19 1928 (!) 154/73     Pulse Rate 04/13/19 1928 82     Resp 04/13/19 1928 18     Temp 04/13/19 1928 98.6 F (37 C)     Temp Source 04/13/19 1928 Oral     SpO2 04/13/19 2206 97 %     Weight --      Height --      Head Circumference --      Peak Flow --      Pain Score 04/13/19 1928 0     Pain Loc --      Pain Edu? --      Excl. in [place]? --     Constitutional: Alert and oriented. Well appearing and in no distress. Head: Normocephalic and atraumatic. Eyes: Conjunctivae are normal. Normal extraocular movements Ears: Canals clear. TMs intact bilaterally. Cardiovascular: Normal rate, regular rhythm. Normal distal pulses. Respiratory: Normal respiratory effort.  Musculoskeletal: Nontender with normal range of motion in all extremities.  Neurologic:  Normal gait without ataxia. Normal speech and language. No gross focal neurologic deficits are appreciated. Skin:  Skin is warm, dry and intact.  Patient with multiple scattered papules to the extremity and trunk noted.  The papules do have a central pustule on erythematous base.  No vesicles, blisters, or bulla are appreciated.  Patient otherwise has high per pigmented macular lesions to the skin that appears chronic in nature secondary to his kidney disease. ____________________________________________  PROCEDURES  Procedures ____________________________________________  INITIAL IMPRESSION / ASSESSMENT AND PLAN / ED COURSE  Orvill Ede was evaluated in Emergency Department on 04/14/2019 for the symptoms described in the history of present illness. He was evaluated in the context of the global COVID-19 pandemic,  which necessitated consideration that the patient might be at risk for infection with the SARS-CoV-2 virus that causes COVID-19. Institutional protocols and algorithms that pertain to the evaluation of patients at risk for COVID-19 are in a state of rapid change based on information released by regulatory bodies including the CDC and federal and state organizations. These policies and algorithms were followed during the patient's care in the ED.  Patient to the ED for evaluation of multiple follicular lesions to the upper extremity and trunk over the last 5 months.  The nonpruritic lesions did not show any signs of acute infection.  Patient has been taking the lesions would set him up for possible secondary infection.  He is discharged at this time with a prescription for Keflex to take as directed.  He is also advised to keep the area clean, dry, covered.  A small bottle of chlorhexidine soap is provided for him to use twice daily.  He will follow with primary provider tomorrow as scheduled or return to the  ED as needed. ____________________________________________  FINAL CLINICAL IMPRESSION(S) / ED DIAGNOSES  Final diagnoses:  Rash and nonspecific skin eruption      Carmie End, Dannielle Karvonen, PA-C 04/14/19 1905    Vanessa , MD 04/15/19 [phone]

## 2019-04-20 ENCOUNTER — Emergency Department: Payer: Medicare Other

## 2019-04-20 ENCOUNTER — Emergency Department
Admission: EM | Admit: 2019-04-20 | Discharge: 2019-04-20 | Disposition: A | Discharge status: Home / Self Care | Payer: Medicare Other | Attending: Emergency Medicine | Admitting: Emergency Medicine

## 2019-04-20 ENCOUNTER — Other Ambulatory Visit: Payer: Self-pay

## 2019-04-20 ENCOUNTER — Encounter: Payer: Self-pay | Admitting: Emergency Medicine

## 2019-04-20 DIAGNOSIS — R079 Chest pain, unspecified: Secondary | ICD-10-CM | POA: Insufficient documentation

## 2019-04-20 DIAGNOSIS — Z5321 Procedure and treatment not carried out due to patient leaving prior to being seen by health care provider: Secondary | ICD-10-CM | POA: Insufficient documentation

## 2019-04-20 DIAGNOSIS — R05 Cough: Secondary | ICD-10-CM | POA: Insufficient documentation

## 2019-04-20 DIAGNOSIS — R55 Syncope and collapse: Secondary | ICD-10-CM | POA: Insufficient documentation

## 2019-04-20 LAB — BASIC METABOLIC PANEL
Anion gap: 17 — ABNORMAL HIGH (ref 5–15)
BUN: 65 mg/dL — ABNORMAL HIGH (ref 6–20)
CO2: 21 mmol/L — ABNORMAL LOW (ref 22–32)
Calcium: 8.6 mg/dL — ABNORMAL LOW (ref 8.9–10.3)
Chloride: 97 mmol/L — ABNORMAL LOW (ref 98–111)
Creatinine, Ser: 11.43 mg/dL — ABNORMAL HIGH (ref 0.61–1.24)
GFR calc Af Amer: 5 mL/min — ABNORMAL LOW (ref >60)
GFR calc non Af Amer: 4 mL/min — ABNORMAL LOW (ref >60)
Glucose, Bld: 144 mg/dL — ABNORMAL HIGH (ref 70–99)
Potassium: 4.7 mmol/L (ref 3.5–5.1)
Sodium: 135 mmol/L (ref 135–145)

## 2019-04-20 LAB — TROPONIN I (HIGH SENSITIVITY): Troponin I (High Sensitivity): 57 ng/L — ABNORMAL HIGH (ref <18)

## 2019-04-20 LAB — CBC
HCT: 30.3 % — ABNORMAL LOW (ref 39.0–52.0)
Hemoglobin: 9.7 g/dL — ABNORMAL LOW (ref 13.0–17.0)
MCH: 30.3 pg (ref 26.0–34.0)
MCHC: 32 g/dL (ref 30.0–36.0)
MCV: 94.7 fL (ref 80.0–100.0)
Platelets: 214 10*3/uL (ref 150–400)
RBC: 3.2 MIL/uL — ABNORMAL LOW (ref 4.22–5.81)
RDW: 16.1 % — ABNORMAL HIGH (ref 11.5–15.5)
WBC: 7.5 10*3/uL (ref 4.0–10.5)
nRBC: 0 % (ref 0.0–0.2)

## 2019-04-20 MED ORDER — SODIUM CHLORIDE 0.9% FLUSH: 3.0000 mL | Freq: Once | INTRAVENOUS | Status: DC

## 2019-04-20 NOTE — ED Triage Notes (Signed)
Pt to triage from PCP office with c/o cough, chest pain and syncope.  Pt states cough is several months old and he has been diagnosed with cough syncope.  Pt states chest pain is with cough and started in the last several days.  Pt states had a syncopal episode last night and called MD.  Was seen at office at 4pm and told to come to ER for further evaluation.

## 2019-04-20 NOTE — ED Notes (Signed)
Pt calls for nurse in waiting room. This RN went to pt and asked if he needed assistance. Pt states "take me to my car I am not waiting in here any long it is cold." Pt informed that we were working diligently to expedite the waiting process and warm blanket was provided. Pt refuses warm blanket and insists this RN wheel him to his car in the parking lot. Pt informed of the risks of leaving.

## 2019-04-22 MED ORDER — LIDOCAINE HCL 1 % IJ SOLN
0.50 | INTRAMUSCULAR | Status: DC
Start: ? — End: 2019-04-22

## 2019-04-22 MED ORDER — BENZONATATE 100 MG PO CAPS
100.00 | ORAL_CAPSULE | ORAL | Status: DC
Start: ? — End: 2019-04-22

## 2019-04-29 ENCOUNTER — Emergency Department: Payer: Medicare Other

## 2019-04-29 ENCOUNTER — Other Ambulatory Visit
Admission: RE | Admit: 2019-04-29 | Discharge: 2019-04-29 | Disposition: A | Discharge status: Home / Self Care | Payer: Medicare Other | Source: Ambulatory Visit | Attending: Nephrology | Admitting: Nephrology

## 2019-04-29 ENCOUNTER — Inpatient Hospital Stay
Admission: EM | Admit: 2019-04-29 | Discharge: 2019-05-13 | DRG: 291 | Disposition: A | Discharge status: Home Health | Payer: Medicare Other | Attending: Internal Medicine | Admitting: Internal Medicine

## 2019-04-29 ENCOUNTER — Other Ambulatory Visit: Payer: Self-pay

## 2019-04-29 DIAGNOSIS — N186 End stage renal disease: Secondary | ICD-10-CM | POA: Insufficient documentation

## 2019-04-29 DIAGNOSIS — Y92239 Unspecified place in hospital as the place of occurrence of the external cause: Secondary | ICD-10-CM | POA: Diagnosis present

## 2019-04-29 DIAGNOSIS — R05 Cough: Secondary | ICD-10-CM

## 2019-04-29 DIAGNOSIS — J440 Chronic obstructive pulmonary disease with acute lower respiratory infection: Secondary | ICD-10-CM | POA: Diagnosis present

## 2019-04-29 DIAGNOSIS — I4891 Unspecified atrial fibrillation: Secondary | ICD-10-CM | POA: Diagnosis present

## 2019-04-29 DIAGNOSIS — Z9115 Patient's noncompliance with renal dialysis: Secondary | ICD-10-CM

## 2019-04-29 DIAGNOSIS — R55 Syncope and collapse: Secondary | ICD-10-CM | POA: Diagnosis not present

## 2019-04-29 DIAGNOSIS — R778 Other specified abnormalities of plasma proteins: Secondary | ICD-10-CM

## 2019-04-29 DIAGNOSIS — E875 Hyperkalemia: Secondary | ICD-10-CM | POA: Diagnosis present

## 2019-04-29 DIAGNOSIS — G8929 Other chronic pain: Secondary | ICD-10-CM | POA: Diagnosis present

## 2019-04-29 DIAGNOSIS — I953 Hypotension of hemodialysis: Secondary | ICD-10-CM | POA: Diagnosis present

## 2019-04-29 DIAGNOSIS — R0602 Shortness of breath: Secondary | ICD-10-CM

## 2019-04-29 DIAGNOSIS — G9389 Other specified disorders of brain: Secondary | ICD-10-CM | POA: Diagnosis present

## 2019-04-29 DIAGNOSIS — J9811 Atelectasis: Secondary | ICD-10-CM | POA: Diagnosis present

## 2019-04-29 DIAGNOSIS — I248 Other forms of acute ischemic heart disease: Secondary | ICD-10-CM | POA: Diagnosis present

## 2019-04-29 DIAGNOSIS — J9601 Acute respiratory failure with hypoxia: Secondary | ICD-10-CM | POA: Diagnosis present

## 2019-04-29 DIAGNOSIS — I132 Hypertensive heart and chronic kidney disease with heart failure and with stage 5 chronic kidney disease, or end stage renal disease: Principal | ICD-10-CM | POA: Diagnosis present

## 2019-04-29 DIAGNOSIS — M25512 Pain in left shoulder: Secondary | ICD-10-CM | POA: Diagnosis present

## 2019-04-29 DIAGNOSIS — W19XXXA Unspecified fall, initial encounter: Secondary | ICD-10-CM | POA: Diagnosis not present

## 2019-04-29 DIAGNOSIS — J189 Pneumonia, unspecified organism: Secondary | ICD-10-CM

## 2019-04-29 DIAGNOSIS — D631 Anemia in chronic kidney disease: Secondary | ICD-10-CM | POA: Diagnosis present

## 2019-04-29 DIAGNOSIS — R059 Cough, unspecified: Secondary | ICD-10-CM

## 2019-04-29 DIAGNOSIS — N2581 Secondary hyperparathyroidism of renal origin: Secondary | ICD-10-CM | POA: Diagnosis present

## 2019-04-29 DIAGNOSIS — S161XXA Strain of muscle, fascia and tendon at neck level, initial encounter: Secondary | ICD-10-CM

## 2019-04-29 DIAGNOSIS — M47812 Spondylosis without myelopathy or radiculopathy, cervical region: Secondary | ICD-10-CM | POA: Diagnosis present

## 2019-04-29 DIAGNOSIS — Z20828 Contact with and (suspected) exposure to other viral communicable diseases: Secondary | ICD-10-CM | POA: Diagnosis present

## 2019-04-29 DIAGNOSIS — E1122 Type 2 diabetes mellitus with diabetic chronic kidney disease: Secondary | ICD-10-CM | POA: Diagnosis present

## 2019-04-29 DIAGNOSIS — J159 Unspecified bacterial pneumonia: Secondary | ICD-10-CM | POA: Diagnosis present

## 2019-04-29 DIAGNOSIS — Z8673 Personal history of transient ischemic attack (TIA), and cerebral infarction without residual deficits: Secondary | ICD-10-CM

## 2019-04-29 DIAGNOSIS — Z8249 Family history of ischemic heart disease and other diseases of the circulatory system: Secondary | ICD-10-CM

## 2019-04-29 DIAGNOSIS — I511 Rupture of chordae tendineae, not elsewhere classified: Secondary | ICD-10-CM | POA: Diagnosis present

## 2019-04-29 DIAGNOSIS — Z992 Dependence on renal dialysis: Secondary | ICD-10-CM

## 2019-04-29 DIAGNOSIS — I340 Nonrheumatic mitral (valve) insufficiency: Secondary | ICD-10-CM | POA: Diagnosis present

## 2019-04-29 DIAGNOSIS — Z79899 Other long term (current) drug therapy: Secondary | ICD-10-CM

## 2019-04-29 DIAGNOSIS — I5023 Acute on chronic systolic (congestive) heart failure: Secondary | ICD-10-CM | POA: Diagnosis present

## 2019-04-29 DIAGNOSIS — R9431 Abnormal electrocardiogram [ECG] [EKG]: Secondary | ICD-10-CM

## 2019-04-29 DIAGNOSIS — I428 Other cardiomyopathies: Secondary | ICD-10-CM | POA: Diagnosis present

## 2019-04-29 DIAGNOSIS — F17210 Nicotine dependence, cigarettes, uncomplicated: Secondary | ICD-10-CM | POA: Diagnosis present

## 2019-04-29 LAB — CBC WITH DIFFERENTIAL/PLATELET
Abs Immature Granulocytes: 0.05 10*3/uL (ref 0.00–0.07)
Basophils Absolute: 0 10*3/uL (ref 0.0–0.1)
Basophils Relative: 0 %
Eosinophils Absolute: 0.1 10*3/uL (ref 0.0–0.5)
Eosinophils Relative: 1 %
HCT: 29.5 % — ABNORMAL LOW (ref 39.0–52.0)
Hemoglobin: 9.4 g/dL — ABNORMAL LOW (ref 13.0–17.0)
Immature Granulocytes: 1 %
Lymphocytes Relative: 9 %
Lymphs Abs: 0.7 10*3/uL (ref 0.7–4.0)
MCH: 30.4 pg (ref 26.0–34.0)
MCHC: 31.9 g/dL (ref 30.0–36.0)
MCV: 95.5 fL (ref 80.0–100.0)
Monocytes Absolute: 0.6 10*3/uL (ref 0.1–1.0)
Monocytes Relative: 8 %
Neutro Abs: 6.1 10*3/uL (ref 1.7–7.7)
Neutrophils Relative %: 81 %
Platelets: 375 10*3/uL (ref 150–400)
RBC: 3.09 MIL/uL — ABNORMAL LOW (ref 4.22–5.81)
RDW: 16.4 % — ABNORMAL HIGH (ref 11.5–15.5)
WBC: 7.6 10*3/uL (ref 4.0–10.5)
nRBC: 0 % (ref 0.0–0.2)

## 2019-04-29 LAB — POTASSIUM: Potassium: 4.2 mmol/L (ref 3.5–5.1)

## 2019-04-29 LAB — CBC
HCT: 29.6 % — ABNORMAL LOW (ref 39.0–52.0)
Hemoglobin: 9.4 g/dL — ABNORMAL LOW (ref 13.0–17.0)
MCH: 30.2 pg (ref 26.0–34.0)
MCHC: 31.8 g/dL (ref 30.0–36.0)
MCV: 95.2 fL (ref 80.0–100.0)
Platelets: 342 10*3/uL (ref 150–400)
RBC: 3.11 MIL/uL — ABNORMAL LOW (ref 4.22–5.81)
RDW: 16.4 % — ABNORMAL HIGH (ref 11.5–15.5)
WBC: 7.6 10*3/uL (ref 4.0–10.5)
nRBC: 0 % (ref 0.0–0.2)

## 2019-04-29 LAB — TROPONIN I (HIGH SENSITIVITY)
Troponin I (High Sensitivity): 145 ng/L (ref <18)
Troponin I (High Sensitivity): 153 ng/L (ref <18)
Troponin I (High Sensitivity): 149 ng/L (ref <18)
Troponin I (High Sensitivity): 133 ng/L (ref <18)

## 2019-04-29 LAB — BASIC METABOLIC PANEL
Anion gap: 10 (ref 5–15)
BUN: 38 mg/dL — ABNORMAL HIGH (ref 6–20)
CO2: 22 mmol/L (ref 22–32)
Calcium: 8.6 mg/dL — ABNORMAL LOW (ref 8.9–10.3)
Chloride: 101 mmol/L (ref 98–111)
Creatinine, Ser: 5.83 mg/dL — ABNORMAL HIGH (ref 0.61–1.24)
GFR calc Af Amer: 11 mL/min — ABNORMAL LOW (ref >60)
GFR calc non Af Amer: 10 mL/min — ABNORMAL LOW (ref >60)
Glucose, Bld: 289 mg/dL — ABNORMAL HIGH (ref 70–99)
Potassium: 4.2 mmol/L (ref 3.5–5.1)
Sodium: 133 mmol/L — ABNORMAL LOW (ref 135–145)

## 2019-04-29 LAB — SARS CORONAVIRUS 2 BY RT PCR (HOSPITAL ORDER, PERFORMED IN ~~LOC~~ HOSPITAL LAB): SARS Coronavirus 2: NEGATIVE

## 2019-04-29 LAB — BRAIN NATRIURETIC PEPTIDE: B Natriuretic Peptide: 1632 pg/mL — ABNORMAL HIGH (ref 0.0–100.0)

## 2019-04-29 LAB — PROCALCITONIN: Procalcitonin: 2.03 ng/mL

## 2019-04-29 MED ORDER — PANTOPRAZOLE SODIUM 40 MG PO TBEC
40.0000 mg | DELAYED_RELEASE_TABLET | Freq: Every day | ORAL | Status: DC
  Administered 2019-04-30 – 2019-05-10 (×11): 40 mg via ORAL
  Filled 2019-04-29 (×12): qty 1

## 2019-04-29 MED ORDER — CYCLOBENZAPRINE HCL 10 MG PO TABS
5.0000 mg | ORAL_TABLET | Freq: Three times a day (TID) | ORAL | Status: DC | PRN
  Administered 2019-04-30 – 2019-05-02 (×2): 5 mg via ORAL
  Filled 2019-04-29 (×2): qty 1

## 2019-04-29 MED ORDER — HEPARIN SODIUM (PORCINE) 5000 UNIT/ML IJ SOLN
5000.0000 [IU] | Freq: Three times a day (TID) | INTRAMUSCULAR | Status: DC
  Administered 2019-05-02: 5000 [IU] via SUBCUTANEOUS
  Filled 2019-04-29 (×10): qty 1

## 2019-04-29 MED ORDER — POLYETHYLENE GLYCOL 3350 17 G PO PACK
17.0000 g | PACK | Freq: Every day | ORAL | Status: DC | PRN
  Administered 2019-05-04 – 2019-05-07 (×2): 17 g via ORAL
  Filled 2019-04-29 (×2): qty 1

## 2019-04-29 MED ORDER — OXYCODONE-ACETAMINOPHEN 5-325 MG PO TABS
1.0000 | ORAL_TABLET | Freq: Once | ORAL | Status: AC
  Administered 2019-04-29: 1 via ORAL
  Filled 2019-04-29: qty 1

## 2019-04-29 MED ORDER — CINACALCET HCL 30 MG PO TABS
30.0000 mg | ORAL_TABLET | ORAL | Status: DC
  Administered 2019-05-02 – 2019-05-13 (×5): 30 mg via ORAL
  Filled 2019-04-29 (×6): qty 1

## 2019-04-29 MED ORDER — ONDANSETRON HCL 4 MG PO TABS
4.0000 mg | ORAL_TABLET | Freq: Four times a day (QID) | ORAL | Status: DC | PRN
  Administered 2019-04-30: 4 mg via ORAL
  Filled 2019-04-29: qty 1

## 2019-04-29 MED ORDER — SACUBITRIL-VALSARTAN 24-26 MG PO TABS
1.0000 | ORAL_TABLET | Freq: Two times a day (BID) | ORAL | Status: DC
  Administered 2019-04-29 – 2019-04-30 (×2): 1 via ORAL
  Filled 2019-04-29 (×2): qty 1

## 2019-04-29 MED ORDER — MORPHINE SULFATE (PF) 4 MG/ML IV SOLN
4.0000 mg | Freq: Once | INTRAVENOUS | Status: AC
  Administered 2019-04-29: 4 mg via INTRAVENOUS
  Filled 2019-04-29: qty 1

## 2019-04-29 MED ORDER — OXYCODONE HCL 5 MG PO TABS
5.0000 mg | ORAL_TABLET | Freq: Four times a day (QID) | ORAL | Status: DC | PRN
  Administered 2019-04-30 – 2019-05-10 (×13): 5 mg via ORAL
  Filled 2019-04-29 (×14): qty 1

## 2019-04-29 MED ORDER — ACETAMINOPHEN 325 MG PO TABS
650.0000 mg | ORAL_TABLET | Freq: Four times a day (QID) | ORAL | Status: DC | PRN
  Administered 2019-05-10: 650 mg via ORAL
  Filled 2019-04-29: qty 2

## 2019-04-29 MED ORDER — FLUTICASONE PROPIONATE 50 MCG/ACT NA SUSP
2.0000 | Freq: Every day | NASAL | Status: DC
  Administered 2019-04-30 – 2019-05-12 (×13): 2 via NASAL
  Filled 2019-04-29: qty 16

## 2019-04-29 MED ORDER — SEVELAMER CARBONATE 800 MG PO TABS
1600.0000 mg | ORAL_TABLET | Freq: Three times a day (TID) | ORAL | Status: DC
  Administered 2019-05-02: 16:00:00 1600 mg via ORAL
  Filled 2019-04-29 (×5): qty 2

## 2019-04-29 MED ORDER — ONDANSETRON HCL 4 MG/2ML IJ SOLN
4.0000 mg | Freq: Four times a day (QID) | INTRAMUSCULAR | Status: DC | PRN
  Administered 2019-04-30 – 2019-05-11 (×5): 4 mg via INTRAVENOUS
  Filled 2019-04-29 (×6): qty 2

## 2019-04-29 MED ORDER — IPRATROPIUM-ALBUTEROL 0.5-2.5 (3) MG/3ML IN SOLN
3.0000 mL | Freq: Four times a day (QID) | RESPIRATORY_TRACT | Status: DC | PRN
  Administered 2019-05-13: 01:00:00 3 mL via RESPIRATORY_TRACT
  Filled 2019-04-29: qty 3

## 2019-04-29 MED ORDER — GUAIFENESIN-CODEINE 100-10 MG/5ML PO SOLN
10.0000 mL | ORAL | Status: DC | PRN
  Administered 2019-05-02 – 2019-05-04 (×3): 10 mL via ORAL
  Filled 2019-04-29 (×5): qty 10

## 2019-04-29 MED ORDER — MORPHINE SULFATE (PF) 2 MG/ML IV SOLN
2.0000 mg | INTRAVENOUS | Status: DC | PRN
  Administered 2019-04-29 – 2019-05-13 (×11): 2 mg via INTRAVENOUS
  Filled 2019-04-29 (×11): qty 1

## 2019-04-29 MED ORDER — SODIUM CHLORIDE 0.9% FLUSH
3.0000 mL | Freq: Once | INTRAVENOUS | Status: AC
  Administered 2019-04-29: 3 mL via INTRAVENOUS

## 2019-04-29 MED ORDER — ALBUTEROL SULFATE (2.5 MG/3ML) 0.083% IN NEBU
2.5000 mg | INHALATION_SOLUTION | Freq: Four times a day (QID) | RESPIRATORY_TRACT | Status: DC | PRN
  Administered 2019-05-11: 2.5 mg via RESPIRATORY_TRACT
  Filled 2019-04-29: qty 3

## 2019-04-29 MED ORDER — ONDANSETRON HCL 4 MG/2ML IJ SOLN
4.0000 mg | Freq: Once | INTRAMUSCULAR | Status: AC
  Administered 2019-04-29: 4 mg via INTRAVENOUS
  Filled 2019-04-29: qty 2

## 2019-04-29 MED ORDER — ASPIRIN 81 MG PO CHEW
324.0000 mg | CHEWABLE_TABLET | Freq: Once | ORAL | Status: AC
  Administered 2019-04-29: 324 mg via ORAL
  Filled 2019-04-29: qty 4

## 2019-04-29 MED ORDER — ACETAMINOPHEN 650 MG RE SUPP: 650.0000 mg | Freq: Four times a day (QID) | RECTAL | Status: DC | PRN

## 2019-04-29 MED ORDER — UMECLIDINIUM-VILANTEROL 62.5-25 MCG/INH IN AEPB
1.0000 | INHALATION_SPRAY | Freq: Every day | RESPIRATORY_TRACT | Status: DC
  Administered 2019-04-30 – 2019-05-12 (×11): 1 via RESPIRATORY_TRACT
  Filled 2019-04-29 (×2): qty 14

## 2019-04-29 MED ORDER — GABAPENTIN 100 MG PO CAPS
100.0000 mg | ORAL_CAPSULE | Freq: Every day | ORAL | Status: DC | PRN
  Administered 2019-05-01 – 2019-05-10 (×6): 100 mg via ORAL
  Filled 2019-04-29 (×6): qty 1

## 2019-04-29 MED ORDER — CALCIUM ACETATE (PHOS BINDER) 667 MG PO CAPS
667.0000 mg | ORAL_CAPSULE | Freq: Three times a day (TID) | ORAL | Status: DC
  Administered 2019-04-30 – 2019-05-13 (×28): 667 mg via ORAL
  Filled 2019-04-29 (×42): qty 1

## 2019-04-29 MED ORDER — BENZONATATE 100 MG PO CAPS
200.0000 mg | ORAL_CAPSULE | Freq: Three times a day (TID) | ORAL | Status: DC
  Administered 2019-04-29 – 2019-05-12 (×34): 200 mg via ORAL
  Filled 2019-04-29 (×37): qty 2

## 2019-04-29 NOTE — ED Notes (Signed)
Unsuccessful attempt at IV access x 2 by both this RN and Tana Conch RN, IV team consult placed

## 2019-04-29 NOTE — ED Provider Notes (Signed)
[facility] Emergency Department Provider Note   ____________________________________________   First MD Initiated Contact with Patient 04/29/19 1911     (approximate)  I have reviewed the triage vital signs and the nursing notes.   HISTORY  Chief Complaint Near Syncope    HPI Jeremy [name] is a 58 y.o. male who comes in complaining of coughing and syncope.  He fell and hit his shoulder and complains of pain there and in his neck.  He has frequent visits for syncope with coughing.  Today he fell again when he passed out and complains of pain in his neck and shoulder.  He had dialysis today and they removed several kilograms of fluid.  He is still somewhat short of breath and coughing.         Past Medical History:  Diagnosis Date  . Bronchitis   . Hypertension   . Renal disorder    kidney failure, dialysis    Patient Active Problem List   Diagnosis Date Noted  . Syncope 04/29/2019  . Acute CHF (congestive heart failure) ([place]) 02/14/2019  . Acute encephalopathy 11/23/2018  . Influenza B 11/21/2018  . Pulmonary edema 09/22/2018  . Hyperkalemia 05/16/2018    Past Surgical History:  Procedure Laterality Date  . DIALYSIS FISTULA CREATION      Prior to Admission medications   Medication Sig Start Date End Date Taking? Authorizing Provider  albuterol (PROVENTIL HFA;VENTOLIN HFA) 108 (90 Base) MCG/ACT inhaler Inhale 2 puffs into the lungs every 6 (six) hours as needed for wheezing or shortness of breath. 11/19/18  Yes Nena Polio, MD  benzonatate (TESSALON) 200 MG capsule Take 1 capsule (200 mg total) by mouth 3 (three) times daily. 04/04/19  Yes Dustin Flock, MD  Budesonide 90 MCG/ACT inhaler Inhale 1 puff into the lungs 2 (two) times daily. 04/23/19 04/22/20 Yes [provider]  calcium acetate (PHOSLO) 667 MG capsule Take 667 mg by mouth 3 (three) times daily with meals.    Yes [provider]  cinacalcet (SENSIPAR) 30 MG  tablet Take 30 mg by mouth 3 (three) times a week. 09/04/15  Yes [provider]  fluticasone (FLONASE) 50 MCG/ACT nasal spray Place 2 sprays into both nostrils daily. 04/05/19  Yes Dustin Flock, MD  gabapentin (NEURONTIN) 100 MG capsule Take 1 capsule (100 mg total) by mouth daily as needed (pain). Patient taking differently: Take 300 mg by mouth at bedtime.  11/22/18  Yes Mody, Ulice Bold, MD  guaiFENesin-codeine 100-10 MG/5ML syrup Take 10 mLs by mouth every 4 (four) hours as needed for cough. 04/04/19  Yes Dustin Flock, MD  Multiple Vitamin (MULTIVITAMIN WITH MINERALS) TABS tablet Take 1 tablet by mouth daily.   Yes [provider]  nicotine (NICODERM CQ - DOSED IN MG/24 HOURS) 21 mg/24hr patch Place 1 patch onto the skin daily. 04/23/19 07/26/19 Yes [provider]  nicotine polacrilex (NICORETTE) 4 MG gum Take 4 mg by mouth as directed. 04/23/19 07/28/19 Yes [provider]  pantoprazole (PROTONIX) 40 MG tablet Take 1 tablet (40 mg total) by mouth daily. 04/05/19  Yes Dustin Flock, MD  sacubitril-valsartan (ENTRESTO) 24-26 MG Take 1 tablet by mouth 2 (two) times a day. 03/03/19  Yes [provider]  umeclidinium-vilanterol (ANORO ELLIPTA) 62.5-25 MCG/INH AEPB Inhale 1 puff into the lungs daily. 02/02/19  Yes [provider]    Allergies Patient has no known allergies.  Family History  Problem Relation Age of Onset  . Hypertension Mother  Social History Social History   Tobacco Use  . Smoking status: Current Every Day Smoker    Packs/day: 1.00    Types: Cigarettes  . Smokeless tobacco: Never Used  Substance Use Topics  . Alcohol use: Never    Frequency: Never  . Drug use: Yes    Types: Marijuana    Review of Systems  Constitutional: No fever/chills Eyes: No visual changes. ENT: No sore throat. Cardiovascular: Denies chest pain. Respiratory: shortness of breath. Gastrointestinal: No abdominal pain.  No nausea, no  vomiting.  No diarrhea.  No constipation. Genitourinary: Negative for dysuria. Musculoskeletal: Negative for back pain. Skin: Negative for rash. Neurological: Negative for headaches, focal weakness   ____________________________________________   PHYSICAL EXAM:  VITAL SIGNS: ED Triage Vitals [04/29/19 1516]  Enc Vitals Group     BP (!) 148/92     Pulse Rate (!) 115     Resp 18     Temp 98.5 F (36.9 C)     Temp src      SpO2 100 %     Weight 155 lb (70.3 kg)     Height 5\' 6"  (1.676 m)     Head Circumference      Peak Flow      Pain Score 10     Pain Loc      Pain Edu?      Excl. in [place]?     Constitutional: Alert and oriented.  Looks uncomfortable and occasionally coughs a lot Eyes: Conjunctivae are normal.  Head: Atraumatic. Nose: No congestion/rhinnorhea. Mouth/Throat: Mucous membranes are moist.  Oropharynx non-erythematous. Neck: No stridor.  There is some tenderness to palpation in the neck Cardiovascular: Normal rate, regular rhythm. Grossly normal heart sounds.  Good peripheral circulation. Respiratory: Normal respiratory effort.  No retractions. Lungs CTAB. Gastrointestinal: Soft and nontender. No distention. No abdominal bruits. No CVA tenderness. Musculoskeletal: No lower extremity tenderness nor edema.  There is some tenderness in the left shoulder to palpation as well Neurologic:  Normal speech and language. No gross focal neurologic deficits are appreciated.  Skin:  Skin is warm, dry and intact. No rash noted. Psychiatric: Mood and affect are normal. Speech and behavior are normal.  ____________________________________________   LABS (all labs ordered are listed, but only abnormal results are displayed)  Labs Reviewed  BASIC METABOLIC PANEL - Abnormal; Notable for the following components:      Result Value   Sodium 133 (*)    Glucose, Bld 289 (*)    BUN 38 (*)    Creatinine, Ser 5.83 (*)    Calcium 8.6 (*)    GFR calc non Af Amer 10 (*)    GFR  calc Af Amer 11 (*)    All other components within normal limits  CBC - Abnormal; Notable for the following components:   RBC 3.11 (*)    Hemoglobin 9.4 (*)    HCT 29.6 (*)    RDW 16.4 (*)    All other components within normal limits  BRAIN NATRIURETIC PEPTIDE - Abnormal; Notable for the following components:   B Natriuretic Peptide 1,632.0 (*)    All other components within normal limits  CBC WITH DIFFERENTIAL/PLATELET - Abnormal; Notable for the following components:   RBC 3.09 (*)    Hemoglobin 9.4 (*)    HCT 29.5 (*)    RDW 16.4 (*)    All other components within normal limits  TROPONIN I (HIGH SENSITIVITY) - Abnormal; Notable for the following components:   Troponin  I (High Sensitivity) 145 (*)    All other components within normal limits  TROPONIN I (HIGH SENSITIVITY) - Abnormal; Notable for the following components:   Troponin I (High Sensitivity) 153 (*)    All other components within normal limits  TROPONIN I (HIGH SENSITIVITY) - Abnormal; Notable for the following components:   Troponin I (High Sensitivity) 149 (*)    All other components within normal limits  SARS CORONAVIRUS 2 (HOSPITAL ORDER, [facility])  BORDETELLA PERTUSSIS PCR  PROCALCITONIN  BASIC METABOLIC PANEL  CBC  CBG MONITORING, ED  TROPONIN I (HIGH SENSITIVITY)   ____________________________________________  EKG  EKG read interpreted by me shows an irregular rhythm at a rate of 115 this may represent sinus rhythm with PVCs or it may be a flutter is difficult to tell.  In any rate patient has flipped T's in inferiorly which are new from prior EKGs. _EKG #2 read interpreted by me shows sinus tach at a rate of 102 normal axis T waves have reverted to upright inferiorly.  ___________________________________________  RADIOLOGY  ED MD interpretation: X-ray of the shoulder read by radiologist reviewed by me shows no acute fracture chest x-ray read by radiology reviewed by me shows  large heart CHF possible left sided basilar infiltrate CT of the head and neck read by radiology reviewed by me shows no acute pathology.  Official radiology report(s): Ct Head Wo Contrast  Result Date: 04/29/2019 CLINICAL DATA:  Head trauma, syncopal episode EXAM: CT HEAD WITHOUT CONTRAST TECHNIQUE: Contiguous axial images were obtained from the base of the skull through the vertex without intravenous contrast. COMPARISON:  02/14/2019 FINDINGS: Brain: No evidence of acute infarction, hemorrhage, extra-axial collection, ventriculomegaly, or mass effect. Generalized cerebral atrophy. Periventricular white matter low attenuation likely secondary to microangiopathy. Vascular: Cerebrovascular atherosclerotic calcifications are noted. Skull: Negative for fracture or focal lesion. Sinuses/Orbits: Visualized portions of the orbits are unremarkable. Visualized portions of the paranasal sinuses and mastoid air cells are unremarkable. Other: None. IMPRESSION: No acute intracranial pathology. Electronically Signed   By: Kathreen Devoid   On: 04/29/2019 16:46   Ct Cervical Spine Wo Contrast  Result Date: 04/29/2019 CLINICAL DATA:  Cervical spine trauma due to syncope. Right-sided neck pain EXAM: CT CERVICAL SPINE WITHOUT CONTRAST TECHNIQUE: Multidetector CT imaging of the cervical spine was performed without intravenous contrast. Multiplanar CT image reconstructions were also generated. COMPARISON:  None. FINDINGS: Alignment: Levocurvature.  No listhesis. Skull base and vertebrae: No acute fracture Soft tissues and spinal canal: No prevertebral fluid or swelling. No visible canal hematoma. Extensive atherosclerosis; there is history of end-stage renal disease. Disc levels: Generalized disc degeneration with large Schmorl's nodes in the C4 and C7 vertebrae. Facet arthropathy with erosive features or subchondral cystic change. Disc narrowing and uncovertebral spurring causes mid cervical foraminal narrowing most notable on  the right at C4-5. Upper chest: Patchy air trapping IMPRESSION: Negative for fracture. Multilevel degenerative disease. Electronically Signed   By: Monte Fantasia M.D.   On: 04/29/2019 19:16   Dg Chest Portable 1 View  Result Date: 04/29/2019 CLINICAL DATA:  Syncope, shortness of breath, cough EXAM: PORTABLE CHEST 1 VIEW COMPARISON:  04/20/2019 FINDINGS: Cardiomegaly, vascular congestion. Increasing left basilar atelectasis or infiltrate. No overt edema. No effusions or acute bony abnormality. IMPRESSION: Cardiomegaly, vascular congestion. Increasing left basilar atelectasis or infiltrate. Electronically Signed   By: Rolm Baptise M.D.   On: 04/29/2019 16:51   Dg Shoulder Left  Result Date: 04/29/2019 CLINICAL DATA:  Left  shoulder pain after syncope, fall. EXAM: LEFT SHOULDER - 2+ VIEW COMPARISON:  None. FINDINGS: There is no evidence of fracture or dislocation. Minimal glenohumeral osteoarthritis. There is no evidence of arthropathy or other focal bone abnormality. Soft tissues are unremarkable. Vascular stent in the left axilla. IMPRESSION: No fracture or malalignment of the left shoulder. Electronically Signed   By: Keith Rake M.D.   On: 04/29/2019 19:18    ____________________________________________   PROCEDURES  Procedure(s) performed (including Critical Care):  Procedures   ____________________________________________   INITIAL IMPRESSION / ASSESSMENT AND PLAN / ED COURSE Patient had successful dialysis today but is still short of breath.  Chest x-ray still shows CHF his BNP is still elevated and his troponin is higher than usual in addition he has EKG changes.  We will get him in the hospital and work on him further.          Clinical Course as of Apr 28 2325  Fri Apr 29, 2019  1617 RDW(!): 16.4 [PM]    Clinical Course User Index [PM] Nena Polio, MD     ____________________________________________   FINAL CLINICAL IMPRESSION(S) / ED DIAGNOSES  Final  diagnoses:  Syncope and collapse  Elevated troponin  Abnormal EKG     ED Discharge Orders    None       Note:  This document was prepared using Dragon voice recognition software and may include unintentional dictation errors.    Nena Polio, MD 04/29/19 [phone]

## 2019-04-29 NOTE — Plan of Care (Signed)
  Problem: Education: Goal: Knowledge of General Education information will improve Description: Including pain rating scale, medication(s)/side effects and non-pharmacologic comfort measures Outcome: Progressing   Problem: Pain Managment: Goal: General experience of comfort will improve Outcome: Progressing   Problem: Safety: Goal: Ability to remain free from injury will improve Outcome: Progressing   

## 2019-04-29 NOTE — ED Notes (Signed)
Pt otf for imaging 

## 2019-04-29 NOTE — H&P (Addendum)
[facility] [facility] [place] NAME: Jeremy [name]    MR#:  [id number]  DATE OF BIRTH:  1960-11-14  DATE OF ADMISSION:  04/29/2019  PRIMARY CARE PHYSICIAN: Patient, No Pcp Per   REQUESTING/REFERRING PHYSICIAN: Conni Slipper, MD  CHIEF COMPLAINT:   Chief Complaint  Patient presents with  . Near Syncope    HISTORY OF PRESENT ILLNESS:  Jeremy [name]  is a 58 y.o. male with a known history of ESRD on HD MWF and hypertension who presented to the ED with cough and syncope over the last 5 months.  Patient states his cough is gotten worse in the last 1-2 days.  He states that when his cough gets bad enough, he passes out.  He has already passed out 4 times today.  All of these episodes of syncope have occurred in the setting of a severe coughing episode.  He does endorse some left shoulder and left-sided neck pain after passing out this morning.  His cough is productive of clear sputum.  He denies any shortness of breath, fevers, chills, chest pain.  He received dialysis today without any issues.  Of note, patient was recently hospitalized at [facility] from [phone] with syncope.  Work-up was completely unremarkable, and he was felt to have posttussive syncope.  In the ED, he was tachycardic. Labs were significant for troponin 145, hemoglobin 9.4. EKG showed new TWI in the inferior leads. CT head unremarkable. CXR with cardiomegaly, vascular congestion, and increasing left basilar atelectasis vs infiltrate. Hospitalists were called for admission.  PAST MEDICAL HISTORY:   Past Medical History:  Diagnosis Date  . Bronchitis   . Hypertension   . Renal disorder    kidney failure, dialysis    PAST SURGICAL HISTORY:   Past Surgical History:  Procedure Laterality Date  . DIALYSIS FISTULA CREATION      SOCIAL HISTORY:   Social History   Tobacco Use  . Smoking status: Current Every Day Smoker    Packs/day: 1.00    Types: Cigarettes  . Smokeless tobacco: Never Used   Substance Use Topics  . Alcohol use: Never    Frequency: Never    FAMILY HISTORY:   Family History  Problem Relation Age of Onset  . Hypertension Mother     DRUG ALLERGIES:  No Known Allergies  REVIEW OF SYSTEMS:   Review of Systems  Constitutional: Negative for chills and fever.  HENT: Negative for congestion and sore throat.   Eyes: Negative for blurred vision and double vision.  Respiratory: Positive for cough and sputum production. Negative for shortness of breath.   Cardiovascular: Negative for chest pain and palpitations.  Gastrointestinal: Negative for nausea and vomiting.  Genitourinary: Negative for dysuria and urgency.  Musculoskeletal: Positive for joint pain. Negative for back pain and neck pain.  Neurological: Positive for loss of consciousness. Negative for dizziness and headaches.  Psychiatric/Behavioral: Negative for depression. The patient is not nervous/anxious.     MEDICATIONS AT HOME:   Prior to Admission medications   Medication Sig Start Date End Date Taking? Authorizing Provider  albuterol (PROVENTIL HFA;VENTOLIN HFA) 108 (90 Base) MCG/ACT inhaler Inhale 2 puffs into the lungs every 6 (six) hours as needed for wheezing or shortness of breath. 11/19/18   Nena Polio, MD  benzonatate (TESSALON) 200 MG capsule Take 1 capsule (200 mg total) by mouth 3 (three) times daily. 04/04/19   Dustin Flock, MD  calcium acetate (PHOSLO) 667 MG capsule Take 667 mg by  mouth 3 (three) times daily. 10/20/13   [provider]  cinacalcet (SENSIPAR) 30 MG tablet Take 30 mg by mouth 3 (three) times a week. 09/04/15   [provider]  fluticasone (FLONASE) 50 MCG/ACT nasal spray Place 2 sprays into both nostrils daily. 04/05/19   Dustin Flock, MD  gabapentin (NEURONTIN) 100 MG capsule Take 1 capsule (100 mg total) by mouth daily as needed (pain). Patient taking differently: Take 300 mg by mouth daily.  11/22/18   Bettey Costa, MD  guaiFENesin-codeine  100-10 MG/5ML syrup Take 10 mLs by mouth every 4 (four) hours as needed for cough. 04/04/19   Dustin Flock, MD  Multiple Vitamin (MULTIVITAMIN WITH MINERALS) TABS tablet Take 1 tablet by mouth daily.    [provider]  pantoprazole (PROTONIX) 40 MG tablet Take 1 tablet (40 mg total) by mouth daily. 04/05/19   Dustin Flock, MD  sacubitril-valsartan (ENTRESTO) 24-26 MG Take 1 tablet by mouth 2 (two) times a day. 03/03/19   [provider]  sevelamer carbonate (RENVELA) 800 MG tablet TK 3 TS PO TID WC 02/25/19   [provider]  umeclidinium-vilanterol (ANORO ELLIPTA) 62.5-25 MCG/INH AEPB Inhale 1 puff into the lungs daily. 02/02/19   [provider]      VITAL SIGNS:  Blood pressure (!) 148/92, pulse (!) 115, temperature 98.5 F (36.9 C), resp. rate 18, height 5\' 6"  (1.676 m), weight 70.3 kg, SpO2 100 %.  PHYSICAL EXAMINATION:  Physical Exam  GENERAL:  58 y.o.-year-old patient lying in the bed with no acute distress.  EYES: Pupils equal, round, reactive to light and accommodation. No scleral icterus. Extraocular muscles intact.  HEENT: Head atraumatic, normocephalic. Oropharynx and nasopharynx clear.  NECK:  Supple, no jugular venous distention. No thyroid enlargement, no tenderness.  LUNGS: + Diminished breath sounds in the lung bases bilaterally, no wheezing, rales,rhonchi or crepitation. No use of accessory muscles of respiration.  CARDIOVASCULAR: Tachycardic, regular rhythm, S1, S2 normal. No murmurs, rubs, or gallops.  ABDOMEN: Soft, nontender, nondistended. Bowel sounds present. No organomegaly or mass.  EXTREMITIES: No pedal edema, cyanosis, or clubbing. + AV fistula present in left upper extremity. NEUROLOGIC: Cranial nerves II through XII are intact. Muscle strength 5/5 in all extremities. Sensation intact. Gait not checked.  PSYCHIATRIC: The patient is alert and oriented x 3.  SKIN: No obvious rash, lesion, or ulcer.   LABORATORY PANEL:    CBC Recent Labs  Lab 04/29/19 1522  WBC 7.6  HGB 9.4*  HCT 29.6*  PLT 342   ------------------------------------------------------------------------------------------------------------------  Chemistries  Recent Labs  Lab 04/29/19 1522  NA 133*  K 4.2  CL 101  CO2 22  GLUCOSE 289*  BUN 38*  CREATININE 5.83*  CALCIUM 8.6*   ------------------------------------------------------------------------------------------------------------------  Cardiac Enzymes No results for input(s): TROPONINI in the last 168 hours. ------------------------------------------------------------------------------------------------------------------  RADIOLOGY:  Ct Head Wo Contrast  Result Date: 04/29/2019 CLINICAL DATA:  Head trauma, syncopal episode EXAM: CT HEAD WITHOUT CONTRAST TECHNIQUE: Contiguous axial images were obtained from the base of the skull through the vertex without intravenous contrast. COMPARISON:  02/14/2019 FINDINGS: Brain: No evidence of acute infarction, hemorrhage, extra-axial collection, ventriculomegaly, or mass effect. Generalized cerebral atrophy. Periventricular white matter low attenuation likely secondary to microangiopathy. Vascular: Cerebrovascular atherosclerotic calcifications are noted. Skull: Negative for fracture or focal lesion. Sinuses/Orbits: Visualized portions of the orbits are unremarkable. Visualized portions of the paranasal sinuses and mastoid air cells are unremarkable. Other: None. IMPRESSION: No acute intracranial pathology. Electronically Signed   By:  Kathreen Devoid   On: 04/29/2019 16:46   Dg Chest Portable 1 View  Result Date: 04/29/2019 CLINICAL DATA:  Syncope, shortness of breath, cough EXAM: PORTABLE CHEST 1 VIEW COMPARISON:  04/20/2019 FINDINGS: Cardiomegaly, vascular congestion. Increasing left basilar atelectasis or infiltrate. No overt edema. No effusions or acute bony abnormality. IMPRESSION: Cardiomegaly, vascular congestion. Increasing left  basilar atelectasis or infiltrate. Electronically Signed   By: Rolm Baptise M.D.   On: 04/29/2019 16:51      IMPRESSION AND PLAN:   Syncope- likely cough syncope, given that he passes out every time he has a severe coughing episode.  Recently hospitalized at [facility] from 8/13-8/15 and had an unremarkable syncope work-up. -Cardiac monitoring -Treat underlying cause -PT consult  Chronic cough- may be secondary to acute bronchitis, pneumonia, uncontrolled COPD. Follows with [facility] for his COPD. Recent RHC 03/30/19 showed very elevated filling pressures. -CXR with left basilar atelectasis vs infiltrate -Check procalcitonin (although may be falsely high in ESRD) -Continue antitussives -Continue home inhalers -Duonebs prn -Will need to f/u with pulmonologist as an outpatient  Elevated troponin- likely demand ischemia.  Patient denies any active chest pain.  EKG with new T wave inversions in the inferior leads. Follows with Dr. Clayborn Bigness. -Trend troponins -Cardiac monitoring -ECHO as above -Would obtain cardiology consult if troponins continue to become elevated  Left shoulder pain- occurred after a fall. 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 -Continue Entresto  Anemia of chronic kidney disease- hemoglobin at baseline -Monitor  Tobacco use -Nicotine patch prn   All the records are reviewed and case discussed with ED provider. Management plans discussed with the patient, family and they are in agreement.  CODE STATUS: Full  TOTAL TIME TAKING CARE OF THIS PATIENT: 45 minutes.    Berna Spare  M.D on 04/29/2019 at 7:13 PM  Between 7am to 6pm - Pager [phone]  After 6pm go to [email or web address] - Proofreader  [facility]  Office  [phone]  CC: Primary care physician; Patient, No Pcp Per   Note: This dictation was prepared with Dragon dictation along with smaller phrase technology. Any transcriptional errors that result from this process are unintentional.

## 2019-04-29 NOTE — ED Notes (Signed)
ED TO INPATIENT HANDOFF REPORT  ED Nurse Name and Phone #: Janett Billow 4   S Name/Age/Gender Jeremy [name] 58 y.o. male Room/Bed: ED03A/ED03A  Code Status   Code Status: Prior  Home/SNF/Other Home Patient oriented to: self, place, time and situation Is this baseline? Yes   Triage Complete: Triage complete  Chief Complaint Passed Out   Triage Note Pt comes via POV from home with c/o syncopal episodes. Pt states this has been happening for awhile. Pt states fast heartbeat. Pt states no sleep.   Pt denies any CP. Pt states SOB and productive cough. Pt also states left sided shoulder pain.  Pt states he has had many syncopal episodes and has hit his head. Pt is dialysis pt. Pt states he had treatment today. Pt unsure of what is causing him to pass out.     Allergies No Known Allergies  Level of Care/Admitting Diagnosis ED Disposition    ED Disposition Condition [facility] Area: [place] [100120]  Level of Care: Telemetry [5]  Covid Evaluation: Asymptomatic Screening Protocol (No Symptoms)  Diagnosis: Syncope [id number]  Admitting Physician: Hyman Bible DODD [id number]  Attending Physician: Hyman Bible DODD [id number]  PT Class (Do Not Modify): Observation [104]  PT Acc Code (Do Not Modify): Observation [10022]       B Medical/Surgery History Past Medical History:  Diagnosis Date  . Bronchitis   . Hypertension   . Renal disorder    kidney failure, dialysis   Past Surgical History:  Procedure Laterality Date  . DIALYSIS FISTULA CREATION       A IV Location/Drains/Wounds Patient Lines/Drains/Airways Status   Active Line/Drains/Airways    Name:   Placement date:   Placement time:   Site:   Days:   Peripheral IV 02/14/19 Right Hand   02/14/19    1905    Hand   74   Peripheral IV 04/29/19 Right;Anterior Forearm   04/29/19    1823    Forearm   less than 1   Fistula / Graft Left Upper arm   05/17/18    -    Upper arm   347           Intake/Output Last 24 hours No intake or output data in the 24 hours ending 04/29/19 2008  Labs/Imaging Results for orders placed or performed during the hospital encounter of 04/29/19 (from the past 48 hour(s))  Basic metabolic panel     Status: Abnormal   Collection Time: 04/29/19  3:22 PM  Result Value Ref Range   Sodium 133 (L) 135 - 145 mmol/L   Potassium 4.2 3.5 - 5.1 mmol/L   Chloride 101 98 - 111 mmol/L   CO2 22 22 - 32 mmol/L   Glucose, Bld 289 (H) 70 - 99 mg/dL   BUN 38 (H) 6 - 20 mg/dL   Creatinine, Ser 5.83 (H) 0.61 - 1.24 mg/dL   Calcium 8.6 (L) 8.9 - 10.3 mg/dL   GFR calc non Af Amer 10 (L) >60 mL/min   GFR calc Af Amer 11 (L) >60 mL/min   Anion gap 10 5 - 15    Comment: Performed at [facility], [address] [place], [place] [address]  CBC     Status: Abnormal   Collection Time: 04/29/19  3:22 PM  Result Value Ref Range   WBC 7.6 4.0 - 10.5 K/uL   RBC 3.11 (L) 4.22 - 5.81 MIL/uL   Hemoglobin 9.4 (L) 13.0 -  17.0 g/dL   HCT 29.6 (L) 39.0 - 52.0 %   MCV 95.2 80.0 - 100.0 fL   MCH 30.2 26.0 - 34.0 pg   MCHC 31.8 30.0 - 36.0 g/dL   RDW 16.4 (H) 11.5 - 15.5 %   Platelets 342 150 - 400 K/uL   nRBC 0.0 0.0 - 0.2 %    Comment: Performed at [facility], [address] [place], [place] [address]  Troponin I (High Sensitivity)     Status: Abnormal   Collection Time: 04/29/19  3:22 PM  Result Value Ref Range   Troponin I (High Sensitivity) 145 (HH) <18 ng/L    Comment: CRITICAL RESULT CALLED TO, READ BACK BY AND VERIFIED WITH Davonne Baby 04/29/19 @ [id number]  [place] (NOTE) Elevated high sensitivity troponin I (hsTnI) values and significant  changes across serial measurements may suggest ACS but many other  chronic and acute conditions are known to elevate hsTnI results.  Refer to the "Links" section for chest pain algorithms and additional  guidance. Performed at [facility], [address] [place], [place] [address]   Brain  natriuretic peptide     Status: Abnormal   Collection Time: 04/29/19  3:22 PM  Result Value Ref Range   B Natriuretic Peptide 1,632.0 (H) 0.0 - 100.0 pg/mL    Comment: Performed at [facility], [address] [place], [place] [address]  CBC with Differential/Platelet     Status: Abnormal   Collection Time: 04/29/19  3:22 PM  Result Value Ref Range   WBC 7.6 4.0 - 10.5 K/uL   RBC 3.09 (L) 4.22 - 5.81 MIL/uL   Hemoglobin 9.4 (L) 13.0 - 17.0 g/dL   HCT 29.5 (L) 39.0 - 52.0 %   MCV 95.5 80.0 - 100.0 fL   MCH 30.4 26.0 - 34.0 pg   MCHC 31.9 30.0 - 36.0 g/dL   RDW 16.4 (H) 11.5 - 15.5 %   Platelets 375 150 - 400 K/uL   nRBC 0.0 0.0 - 0.2 %   Neutrophils Relative % 81 %   Neutro Abs 6.1 1.7 - 7.7 K/uL   Lymphocytes Relative 9 %   Lymphs Abs 0.7 0.7 - 4.0 K/uL   Monocytes Relative 8 %   Monocytes Absolute 0.6 0.1 - 1.0 K/uL   Eosinophils Relative 1 %   Eosinophils Absolute 0.1 0.0 - 0.5 K/uL   Basophils Relative 0 %   Basophils Absolute 0.0 0.0 - 0.1 K/uL   Immature Granulocytes 1 %   Abs Immature Granulocytes 0.05 0.00 - 0.07 K/uL    Comment: Performed at [facility] [address] [place], [place] [address]  SARS Coronavirus 2 [facility] order, Performed in [facility] hospital lab) Nasopharyngeal Nasopharyngeal Swab     Status: None   Collection Time: 04/29/19  6:03 PM   Specimen: Nasopharyngeal Swab  Result Value Ref Range   SARS Coronavirus 2 NEGATIVE NEGATIVE    Comment: (NOTE) If result is NEGATIVE SARS-CoV-2 target nucleic acids are NOT DETECTED. The SARS-CoV-2 RNA is generally detectable in upper and lower  respiratory specimens during the acute phase of infection. The lowest  concentration of SARS-CoV-2 viral copies this assay can detect is 250  copies / mL. A negative result does not preclude SARS-CoV-2 infection  and should not be used as the sole basis for treatment or other  patient management decisions.  A negative result may occur with   improper specimen collection / handling, submission of specimen other  than nasopharyngeal swab, presence of viral mutation(s) within the  areas targeted by this assay, and inadequate number of viral copies  (<250 copies / mL). A negative result must be combined with clinical  observations, patient history, and epidemiological information. If result is POSITIVE SARS-CoV-2 target nucleic acids are DETECTED. The SARS-CoV-2 RNA is generally detectable in upper and lower  respiratory specimens dur ing the acute phase of infection.  Positive  results are indicative of active infection with SARS-CoV-2.  Clinical  correlation with patient history and other diagnostic information is  necessary to determine patient infection status.  Positive results do  not rule out bacterial infection or co-infection with other viruses. If result is PRESUMPTIVE POSTIVE SARS-CoV-2 nucleic acids MAY BE PRESENT.   A presumptive positive result was obtained on the submitted specimen  and confirmed on repeat testing.  While 2019 novel coronavirus  (SARS-CoV-2) nucleic acids may be present in the submitted sample  additional confirmatory testing may be necessary for epidemiological  and / or clinical management purposes  to differentiate between  SARS-CoV-2 and other Sarbecovirus currently known to infect humans.  If clinically indicated additional testing with an alternate test  methodology [phone]) is advised. The SARS-CoV-2 RNA is generally  detectable in upper and lower respiratory sp ecimens during the acute  phase of infection. The expected result is Negative. Fact Sheet for Patients:  StrictlyIdeas.no Fact Sheet for Healthcare Providers: BankingDealers.co.za This test is not yet approved or cleared by the [place] FDA and has been authorized for detection and/or diagnosis of SARS-CoV-2 by FDA under an Emergency Use Authorization (EUA).  This EUA will  remain in effect (meaning this test can be used) for the duration of the COVID-19 declaration under Section 564(b)(1) of the Act, 21 U.S.C. section 360bbb-3(b)(1), unless the authorization is terminated or revoked sooner. Performed at [facility], [place], [place] [address]    Ct Head Wo Contrast  Result Date: 04/29/2019 CLINICAL DATA:  Head trauma, syncopal episode EXAM: CT HEAD WITHOUT CONTRAST TECHNIQUE: Contiguous axial images were obtained from the base of the skull through the vertex without intravenous contrast. COMPARISON:  02/14/2019 FINDINGS: Brain: No evidence of acute infarction, hemorrhage, extra-axial collection, ventriculomegaly, or mass effect. Generalized cerebral atrophy. Periventricular white matter low attenuation likely secondary to microangiopathy. Vascular: Cerebrovascular atherosclerotic calcifications are noted. Skull: Negative for fracture or focal lesion. Sinuses/Orbits: Visualized portions of the orbits are unremarkable. Visualized portions of the paranasal sinuses and mastoid air cells are unremarkable. Other: None. IMPRESSION: No acute intracranial pathology. Electronically Signed   By: Kathreen Devoid   On: 04/29/2019 16:46   Ct Cervical Spine Wo Contrast  Result Date: 04/29/2019 CLINICAL DATA:  Cervical spine trauma due to syncope. Right-sided neck pain EXAM: CT CERVICAL SPINE WITHOUT CONTRAST TECHNIQUE: Multidetector CT imaging of the cervical spine was performed without intravenous contrast. Multiplanar CT image reconstructions were also generated. COMPARISON:  None. FINDINGS: Alignment: Levocurvature.  No listhesis. Skull base and vertebrae: No acute fracture Soft tissues and spinal canal: No prevertebral fluid or swelling. No visible canal hematoma. Extensive atherosclerosis; there is history of end-stage renal disease. Disc levels: Generalized disc degeneration with large Schmorl's nodes in the C4 and C7 vertebrae. Facet arthropathy with  erosive features or subchondral cystic change. Disc narrowing and uncovertebral spurring causes mid cervical foraminal narrowing most notable on the right at C4-5. Upper chest: Patchy air trapping IMPRESSION: Negative for fracture. Multilevel degenerative disease. Electronically Signed   By: Neva Seat.D.  On: 04/29/2019 19:16   Dg Chest Portable 1 View  Result Date: 04/29/2019 CLINICAL DATA:  Syncope, shortness of breath, cough EXAM: PORTABLE CHEST 1 VIEW COMPARISON:  04/20/2019 FINDINGS: Cardiomegaly, vascular congestion. Increasing left basilar atelectasis or infiltrate. No overt edema. No effusions or acute bony abnormality. IMPRESSION: Cardiomegaly, vascular congestion. Increasing left basilar atelectasis or infiltrate. Electronically Signed   By: Rolm Baptise M.D.   On: 04/29/2019 16:51   Dg Shoulder Left  Result Date: 04/29/2019 CLINICAL DATA:  Left shoulder pain after syncope, fall. EXAM: LEFT SHOULDER - 2+ VIEW COMPARISON:  None. FINDINGS: There is no evidence of fracture or dislocation. Minimal glenohumeral osteoarthritis. There is no evidence of arthropathy or other focal bone abnormality. Soft tissues are unremarkable. Vascular stent in the left axilla. IMPRESSION: No fracture or malalignment of the left shoulder. Electronically Signed   By: Keith Rake M.D.   On: 04/29/2019 19:18    Pending Labs [employer] (From admission, onward)    Start     Ordered   Signed and Occupational hygienist morning,   R     Signed and Held   Signed and Held  CBC  Tomorrow morning,   R     Signed and Held   Signed and Held  Procalcitonin - Baseline  ONCE - STAT,   STAT     Signed and Held          Vitals/Pain Today's Vitals   04/29/19 1913 04/29/19 1914 04/29/19 1929 04/29/19 2000  BP: 100/85  113/88 124/87  Pulse: (!) 106  (!) 103 83  Resp: (!) 22  (!) 21   Temp:   98.4 F (36.9 C)   TempSrc:   Oral   SpO2: (!) 89%  100% 99%  Weight:      Height:       PainSc:  3  4      Isolation Precautions No active isolations  Medications Medications  sodium chloride flush (NS) 0.9 % injection 3 mL (3 mLs Intravenous Given 04/29/19 1915)  morphine 4 MG/ML injection 4 mg (4 mg Intravenous Given 04/29/19 1831)  ondansetron (ZOFRAN) injection 4 mg (4 mg Intravenous Given 04/29/19 1831)  aspirin chewable tablet 324 mg (324 mg Oral Given 04/29/19 1800)  oxyCODONE-acetaminophen (PERCOCET/ROXICET) 5-325 MG per tablet 1 tablet (1 tablet Oral Given 04/29/19 1759)    Mobility walks Moderate fall risk   Focused Assessments 1   R Recommendations: See Admitting Provider Note  Report given to:   Additional Notes:

## 2019-04-29 NOTE — ED Notes (Signed)
Lab informed of add on trop

## 2019-04-29 NOTE — ED Notes (Signed)
pts room air Saturation 88%, pt placed on 2L supplemental o2

## 2019-04-29 NOTE — Progress Notes (Signed)
CCMD called about pt. Heart rate sustaining in the 140s-150s. Pt. Is still in NSR with some PACs.Pt. also complaining of pain in his left shoulder. MD made aware. New orders given.

## 2019-04-29 NOTE — ED Notes (Signed)
Pt laying in bed speaking with this RN in NAD, pt reports coughing x 6 weeks that results in him passing out. Pt reports he was dx with ground glass opacities yesterday at pumonology. PT also gets dialysis and is requesting emergency dialysis

## 2019-04-29 NOTE — ED Triage Notes (Addendum)
Pt comes via POV from home with c/o syncopal episodes. Pt states this has been happening for awhile. Pt states fast heartbeat. Pt states no sleep.   Pt denies any CP. Pt states SOB and productive cough. Pt also states left sided shoulder pain.  Pt states he has had many syncopal episodes and has hit his head. Pt is dialysis pt. Pt states he had treatment today. Pt unsure of what is causing him to pass out.

## 2019-04-29 NOTE — ED Notes (Signed)
Report given to 2A RN

## 2019-04-30 ENCOUNTER — Observation Stay: Payer: Medicare Other

## 2019-04-30 DIAGNOSIS — G9389 Other specified disorders of brain: Secondary | ICD-10-CM | POA: Diagnosis present

## 2019-04-30 DIAGNOSIS — Y92239 Unspecified place in hospital as the place of occurrence of the external cause: Secondary | ICD-10-CM | POA: Diagnosis present

## 2019-04-30 DIAGNOSIS — J159 Unspecified bacterial pneumonia: Secondary | ICD-10-CM | POA: Diagnosis present

## 2019-04-30 DIAGNOSIS — N186 End stage renal disease: Secondary | ICD-10-CM | POA: Diagnosis present

## 2019-04-30 DIAGNOSIS — M47812 Spondylosis without myelopathy or radiculopathy, cervical region: Secondary | ICD-10-CM | POA: Diagnosis present

## 2019-04-30 DIAGNOSIS — I511 Rupture of chordae tendineae, not elsewhere classified: Secondary | ICD-10-CM | POA: Diagnosis present

## 2019-04-30 DIAGNOSIS — J9811 Atelectasis: Secondary | ICD-10-CM | POA: Diagnosis present

## 2019-04-30 DIAGNOSIS — R55 Syncope and collapse: Secondary | ICD-10-CM | POA: Diagnosis present

## 2019-04-30 DIAGNOSIS — J440 Chronic obstructive pulmonary disease with acute lower respiratory infection: Secondary | ICD-10-CM | POA: Diagnosis present

## 2019-04-30 DIAGNOSIS — E875 Hyperkalemia: Secondary | ICD-10-CM | POA: Diagnosis present

## 2019-04-30 DIAGNOSIS — F0781 Postconcussional syndrome: Secondary | ICD-10-CM | POA: Diagnosis not present

## 2019-04-30 DIAGNOSIS — I248 Other forms of acute ischemic heart disease: Secondary | ICD-10-CM | POA: Diagnosis present

## 2019-04-30 DIAGNOSIS — E1122 Type 2 diabetes mellitus with diabetic chronic kidney disease: Secondary | ICD-10-CM | POA: Diagnosis present

## 2019-04-30 DIAGNOSIS — I953 Hypotension of hemodialysis: Secondary | ICD-10-CM | POA: Diagnosis present

## 2019-04-30 DIAGNOSIS — W19XXXA Unspecified fall, initial encounter: Secondary | ICD-10-CM | POA: Diagnosis not present

## 2019-04-30 DIAGNOSIS — Z992 Dependence on renal dialysis: Secondary | ICD-10-CM | POA: Diagnosis not present

## 2019-04-30 DIAGNOSIS — M25512 Pain in left shoulder: Secondary | ICD-10-CM | POA: Diagnosis present

## 2019-04-30 DIAGNOSIS — Z20828 Contact with and (suspected) exposure to other viral communicable diseases: Secondary | ICD-10-CM | POA: Diagnosis present

## 2019-04-30 DIAGNOSIS — J9601 Acute respiratory failure with hypoxia: Secondary | ICD-10-CM | POA: Diagnosis present

## 2019-04-30 DIAGNOSIS — G8929 Other chronic pain: Secondary | ICD-10-CM | POA: Diagnosis present

## 2019-04-30 DIAGNOSIS — D631 Anemia in chronic kidney disease: Secondary | ICD-10-CM | POA: Diagnosis present

## 2019-04-30 DIAGNOSIS — I428 Other cardiomyopathies: Secondary | ICD-10-CM | POA: Diagnosis present

## 2019-04-30 DIAGNOSIS — N2581 Secondary hyperparathyroidism of renal origin: Secondary | ICD-10-CM | POA: Diagnosis present

## 2019-04-30 DIAGNOSIS — I4891 Unspecified atrial fibrillation: Secondary | ICD-10-CM | POA: Diagnosis present

## 2019-04-30 DIAGNOSIS — I5023 Acute on chronic systolic (congestive) heart failure: Secondary | ICD-10-CM | POA: Diagnosis present

## 2019-04-30 DIAGNOSIS — I132 Hypertensive heart and chronic kidney disease with heart failure and with stage 5 chronic kidney disease, or end stage renal disease: Secondary | ICD-10-CM | POA: Diagnosis present

## 2019-04-30 DIAGNOSIS — I340 Nonrheumatic mitral (valve) insufficiency: Secondary | ICD-10-CM | POA: Diagnosis present

## 2019-04-30 LAB — BASIC METABOLIC PANEL
Anion gap: 12 (ref 5–15)
Anion gap: 16 — ABNORMAL HIGH (ref 5–15)
BUN: 53 mg/dL — ABNORMAL HIGH (ref 6–20)
BUN: 58 mg/dL — ABNORMAL HIGH (ref 6–20)
CO2: 23 mmol/L (ref 22–32)
CO2: 21 mmol/L — ABNORMAL LOW (ref 22–32)
Calcium: 8.6 mg/dL — ABNORMAL LOW (ref 8.9–10.3)
Calcium: 8.4 mg/dL — ABNORMAL LOW (ref 8.9–10.3)
Chloride: 103 mmol/L (ref 98–111)
Chloride: 100 mmol/L (ref 98–111)
Creatinine, Ser: 7.31 mg/dL — ABNORMAL HIGH (ref 0.61–1.24)
Creatinine, Ser: 7.54 mg/dL — ABNORMAL HIGH (ref 0.61–1.24)
GFR calc Af Amer: 9 mL/min — ABNORMAL LOW (ref >60)
GFR calc Af Amer: 8 mL/min — ABNORMAL LOW (ref >60)
GFR calc non Af Amer: 8 mL/min — ABNORMAL LOW (ref >60)
GFR calc non Af Amer: 7 mL/min — ABNORMAL LOW (ref >60)
Glucose, Bld: 249 mg/dL — ABNORMAL HIGH (ref 70–99)
Glucose, Bld: 270 mg/dL — ABNORMAL HIGH (ref 70–99)
Potassium: 5.3 mmol/L — ABNORMAL HIGH (ref 3.5–5.1)
Potassium: 5.7 mmol/L — ABNORMAL HIGH (ref 3.5–5.1)
Sodium: 138 mmol/L (ref 135–145)
Sodium: 137 mmol/L (ref 135–145)

## 2019-04-30 LAB — CBC
HCT: 32.4 % — ABNORMAL LOW (ref 39.0–52.0)
Hemoglobin: 10 g/dL — ABNORMAL LOW (ref 13.0–17.0)
MCH: 29.9 pg (ref 26.0–34.0)
MCHC: 30.9 g/dL (ref 30.0–36.0)
MCV: 97 fL (ref 80.0–100.0)
Platelets: 401 10*3/uL — ABNORMAL HIGH (ref 150–400)
RBC: 3.34 MIL/uL — ABNORMAL LOW (ref 4.22–5.81)
RDW: 16.7 % — ABNORMAL HIGH (ref 11.5–15.5)
WBC: 10.5 10*3/uL (ref 4.0–10.5)
nRBC: 0 % (ref 0.0–0.2)

## 2019-04-30 MED ORDER — AMIODARONE HCL IN DEXTROSE 360-4.14 MG/200ML-% IV SOLN
INTRAVENOUS | Status: AC
  Filled 2019-04-30: qty 200

## 2019-04-30 MED ORDER — AMIODARONE IV BOLUS ONLY 150 MG/100ML
150.0000 mg | INTRAVENOUS | Status: AC
  Administered 2019-04-30: 150 mg via INTRAVENOUS
  Filled 2019-04-30: qty 100

## 2019-04-30 MED ORDER — AMIODARONE HCL IN DEXTROSE 360-4.14 MG/200ML-% IV SOLN
30.0000 mg/h | INTRAVENOUS | Status: DC
  Administered 2019-05-01 (×2): 30 mg/h via INTRAVENOUS
  Filled 2019-04-30: qty 200

## 2019-04-30 MED ORDER — SODIUM ZIRCONIUM CYCLOSILICATE 5 G PO PACK
5.0000 g | PACK | Freq: Every day | ORAL | Status: DC
  Administered 2019-04-30 (×2): 5 g via ORAL
  Filled 2019-04-30 (×2): qty 1

## 2019-04-30 MED ORDER — CHLORHEXIDINE GLUCONATE CLOTH 2 % EX PADS
6.0000 | MEDICATED_PAD | Freq: Every day | CUTANEOUS | Status: DC
  Administered 2019-05-02 – 2019-05-11 (×5): 6 via TOPICAL

## 2019-04-30 MED ORDER — AMIODARONE HCL IN DEXTROSE 360-4.14 MG/200ML-% IV SOLN
60.0000 mg/h | INTRAVENOUS | Status: AC
  Administered 2019-04-30: 60 mg/h via INTRAVENOUS
  Filled 2019-04-30: qty 200

## 2019-04-30 NOTE — Plan of Care (Signed)
?  Problem: Education: ?Goal: Knowledge of General Education information will improve ?Description: Including pain rating scale, medication(s)/side effects and non-pharmacologic comfort measures ?Outcome: Progressing ?  ?Problem: Health Behavior/Discharge Planning: ?Goal: Ability to manage health-related needs will improve ?Outcome: Progressing ?  ?Problem: Clinical Measurements: ?Goal: Ability to maintain clinical measurements within normal limits will improve ?Outcome: Progressing ?Goal: Will remain free from infection ?Outcome: Progressing ?Goal: Diagnostic test results will improve ?Outcome: Progressing ?Goal: Respiratory complications will improve ?Outcome: Progressing ?Goal: Cardiovascular complication will be avoided ?Outcome: Progressing ?  ?Problem: Coping: ?Goal: Level of anxiety will decrease ?Outcome: Progressing ?  ?Problem: Pain Managment: ?Goal: General experience of comfort will improve ?Outcome: Progressing ?  ?Problem: Safety: ?Goal: Ability to remain free from injury will improve ?Outcome: Progressing ?  ?Problem: Skin Integrity: ?Goal: Risk for impaired skin integrity will decrease ?Outcome: Progressing ?  ?

## 2019-04-30 NOTE — Progress Notes (Signed)
Went to give patient his Entresto and Hep. Sub q medications. Pt refused and stated "I dont think I should take the [facility] because my blood pressure is in the 120s and that's too low. Its normally in the 140's-150's. He also stated that he is not taking the heparin because it stings." I educated the patient that having a SBP in the 140s-150s is too high and that we want your BP in the 120s its helping to normalize your BP. Pt still refused. I also educated the pt since he is just laying in the hospital bed and not doing his normal daily routine  that heparin helps prevent blood clots and that he should take it. Pt still refused everything after education. Md notified and made aware of the refusal of the medications.

## 2019-04-30 NOTE — Progress Notes (Signed)
HD TX TERMINATED 15 MINUTES EARLY.Patient request due nausea and vomiting. Today was extra tx. Heart rate remained above 140  for the majority of the treatment. Dr. Juleen China is aware. 2070ml fluid removal.

## 2019-04-30 NOTE — Progress Notes (Signed)
PRE-HD   04/30/19 1240  Vital Signs  Temp 98.8 F (37.1 C)  Temp Source Oral  Pulse Rate 96  Pulse Rate Source Monitor  Resp (!) 22  BP 110/68  BP Location Right Arm  BP Method Automatic  Patient Position (if appropriate) Lying  Oxygen Therapy  SpO2 (!) 85 %  O2 Device Room Air (placed on O2)  Pain Assessment  Pain Scale 0-10  Pain Score 0  Dialysis Weight  Weight 67 kg  Type of Weight Pre-Dialysis  Time-Out for Hemodialysis  What Procedure? hd  Pt Identifiers(min of two) First/Last Name;MRN/Account#  Correct Site? Yes  Correct Side? Yes  Correct Procedure? Yes  Consents Verified? Yes  Rad Studies Available? N/A  Safety Precautions Reviewed? Yes  Education / Care Plan  Dialysis Education Provided Yes  Documented Education in Care Plan Yes  Fistula / Graft Left Upper arm  Placement Date: 05/17/18   Placed prior to admission: Yes  Orientation: Left  Access Location: Upper arm  Site Condition No complications  Fistula / Graft Assessment Present;Thrill;Bruit  Status Accessed

## 2019-04-30 NOTE — Plan of Care (Signed)
  Problem: Education: Goal: Knowledge of General Education information will improve Description: Including pain rating scale, medication(s)/side effects and non-pharmacologic comfort measures Outcome: Progressing   Problem: Coping: Goal: Level of anxiety will decrease Outcome: Progressing   Problem: Pain Managment: Goal: General experience of comfort will improve Outcome: Progressing   

## 2019-04-30 NOTE — Progress Notes (Signed)
HD PRE-MACHINE/HEP

## 2019-04-30 NOTE — Progress Notes (Signed)
PT Cancellation Note  Patient Details Name: Jeremy [name] MRN: [id number] DOB: Sep 20, 1960   Cancelled Treatment:    Reason Eval/Treat Not Completed: Medical issues which prohibited therapy(Consult received and chart reviewed.  Per discussion with primary RN, patient "not having good day"; recommends hold on evaluation at this time.  Will continue to follow and initiate as medically appropriate.)   Suda Forbess H. Owens Shark, PT, DPT, NCS 04/30/19, 10:14 AM [phone]

## 2019-04-30 NOTE — Progress Notes (Signed)
Provider made aware  That the patient converted to NSR. Will continue to monitor pts heart rate and blood pressure throughout the night.

## 2019-04-30 NOTE — Plan of Care (Signed)
  Problem: Education: Goal: Knowledge of General Education information will improve Description: Including pain rating scale, medication(s)/side effects and non-pharmacologic comfort measures 04/30/2019 2001 by Aubery Lapping, RN Outcome: Progressing 04/30/2019 1250 by Aubery Lapping, RN Outcome: Progressing   Problem: Health Behavior/Discharge Planning: Goal: Ability to manage health-related needs will improve 04/30/2019 2001 by Aubery Lapping, RN Outcome: Progressing 04/30/2019 1250 by Aubery Lapping, RN Outcome: Progressing   Problem: Clinical Measurements: Goal: Ability to maintain clinical measurements within normal limits will improve 04/30/2019 2001 by Aubery Lapping, RN Outcome: Progressing 04/30/2019 1250 by Aubery Lapping, RN Outcome: Progressing Goal: Will remain free from infection 04/30/2019 2001 by Aubery Lapping, RN Outcome: Progressing 04/30/2019 1250 by Aubery Lapping, RN Outcome: Progressing Goal: Diagnostic test results will improve 04/30/2019 2001 by Aubery Lapping, RN Outcome: Progressing 04/30/2019 1250 by Aubery Lapping, RN Outcome: Progressing Goal: Respiratory complications will improve 04/30/2019 2001 by Aubery Lapping, RN Outcome: Progressing 04/30/2019 1250 by Aubery Lapping, RN Outcome: Progressing Goal: Cardiovascular complication will be avoided 04/30/2019 2001 by Aubery Lapping, RN Outcome: Progressing 04/30/2019 1250 by Aubery Lapping, RN Outcome: Progressing   Problem: Coping: Goal: Level of anxiety will decrease 04/30/2019 2001 by Aubery Lapping, RN Outcome: Progressing 04/30/2019 1250 by Aubery Lapping, RN Outcome: Progressing   Problem: Pain Managment: Goal: General experience of comfort will improve 04/30/2019 2001 by Aubery Lapping, RN Outcome: Progressing 04/30/2019 1250 by Aubery Lapping, RN Outcome: Progressing   Problem: Safety: Goal: Ability to  remain free from injury will improve 04/30/2019 2001 by Aubery Lapping, RN Outcome: Progressing 04/30/2019 1250 by Aubery Lapping, RN Outcome: Progressing   Problem: Skin Integrity: Goal: Risk for impaired skin integrity will decrease 04/30/2019 2001 by Aubery Lapping, RN Outcome: Progressing 04/30/2019 1250 by Aubery Lapping, RN Outcome: Progressing

## 2019-04-30 NOTE — Progress Notes (Signed)
Writer witnessed a 15 second syncopal episode following suppressed coughing; Dr. Bridgett Larsson informed.  Immediately followed by nausea and dry heaves; medicated with zofran.

## 2019-04-30 NOTE — Progress Notes (Signed)
[facility] [place] NAME: Jeremy [name]    MR#:  [id number]  DATE OF BIRTH:  1961-03-20  SUBJECTIVE:  CHIEF COMPLAINT:   Chief Complaint  Patient presents with   Near Syncope   The patient had syncope again in the morning.  He also complains of shortness of breath, left shoulder pain.  He developed A. fib with RVR at 140s during dialysis just now. REVIEW OF SYSTEMS:  Review of Systems  Constitutional: Positive for malaise/fatigue. Negative for chills and fever.  HENT: Negative for sore throat.   Eyes: Negative for blurred vision and double vision.  Respiratory: Positive for cough and shortness of breath. Negative for hemoptysis, sputum production, wheezing and stridor.   Cardiovascular: Negative for chest pain, palpitations, orthopnea and leg swelling.  Gastrointestinal: Negative for abdominal pain, blood in stool, diarrhea, melena, nausea and vomiting.  Genitourinary: Negative for dysuria, flank pain and hematuria.  Musculoskeletal: Positive for joint pain. Negative for back pain and neck pain.  Skin: Negative for rash.  Neurological: Positive for loss of consciousness. Negative for dizziness, sensory change, focal weakness, seizures, weakness and headaches.  Endo/Heme/Allergies: Negative for polydipsia.  Psychiatric/Behavioral: Negative for depression. The patient is not nervous/anxious.     DRUG ALLERGIES:  No Known Allergies VITALS:  Blood pressure 95/78, pulse 100, temperature 98.5 F (36.9 C), resp. rate 18, height 5\' 6"  (1.676 m), weight 66.7 kg, SpO2 92 %. PHYSICAL EXAMINATION:  Physical Exam HENT:     Head: Normocephalic.     Mouth/Throat:     Mouth: Mucous membranes are moist.  Eyes:     General: No scleral icterus.    Conjunctiva/sclera: Conjunctivae normal.     Pupils: Pupils are equal, round, and reactive to light.  Neck:     Musculoskeletal: Normal range of motion and neck supple.     Vascular: No JVD.      Trachea: No tracheal deviation.     Comments: Positive JVD. Cardiovascular:     Rate and Rhythm: Tachycardia present. Rhythm irregular.     Heart sounds: Normal heart sounds. No murmur. No gallop.   Pulmonary:     Effort: Pulmonary effort is normal. No respiratory distress.     Breath sounds: No stridor. Rales present. No wheezing or rhonchi.  Abdominal:     General: Bowel sounds are normal. There is no distension.     Palpations: Abdomen is soft.     Tenderness: There is no abdominal tenderness. There is no rebound.  Musculoskeletal: Normal range of motion.        General: No tenderness.     Right lower leg: No edema.     Left lower leg: No edema.  Skin:    Findings: No erythema or rash.  Neurological:     General: No focal deficit present.     Mental Status: He is alert and oriented to person, place, and time.     Cranial Nerves: No cranial nerve deficit.  Psychiatric:        Mood and Affect: Mood normal.    LABORATORY PANEL:  Male CBC Recent Labs  Lab 04/30/19 0559  WBC 10.5  HGB 10.0*  HCT 32.4*  PLT 401*   ------------------------------------------------------------------------------------------------------------------ Chemistries  Recent Labs  Lab 04/30/19 0559  NA 138  K 5.3*  CL 103  CO2 23  GLUCOSE 249*  BUN 53*  CREATININE 7.31*  CALCIUM 8.6*   RADIOLOGY:  Dg Chest 2 View  Result Date:  04/30/2019 CLINICAL DATA:  Cough and shortness of breath EXAM: CHEST - 2 VIEW COMPARISON:  Chest radiograph 04/29/2019 FINDINGS: Monitoring lead overlies the patient. Stable cardiomegaly. Mild bilateral interstitial pulmonary opacities. Retrocardiac consolidation. Possible small left pleural effusion. No pneumothorax. Stent material over the left axilla. IMPRESSION: Cardiomegaly with pulmonary vascular redistribution and mild interstitial edema. Retrocardiac consolidation may represent small left pleural effusion and underlying atelectasis or infection. Electronically  Signed   By: Lovey Newcomer M.D.   On: 04/30/2019 11:04   Ct Head Wo Contrast  Result Date: 04/29/2019 CLINICAL DATA:  Head trauma, syncopal episode EXAM: CT HEAD WITHOUT CONTRAST TECHNIQUE: Contiguous axial images were obtained from the base of the skull through the vertex without intravenous contrast. COMPARISON:  02/14/2019 FINDINGS: Brain: No evidence of acute infarction, hemorrhage, extra-axial collection, ventriculomegaly, or mass effect. Generalized cerebral atrophy. Periventricular white matter low attenuation likely secondary to microangiopathy. Vascular: Cerebrovascular atherosclerotic calcifications are noted. Skull: Negative for fracture or focal lesion. Sinuses/Orbits: Visualized portions of the orbits are unremarkable. Visualized portions of the paranasal sinuses and mastoid air cells are unremarkable. Other: None. IMPRESSION: No acute intracranial pathology. Electronically Signed   By: Kathreen Devoid   On: 04/29/2019 16:46   Ct Cervical Spine Wo Contrast  Result Date: 04/29/2019 CLINICAL DATA:  Cervical spine trauma due to syncope. Right-sided neck pain EXAM: CT CERVICAL SPINE WITHOUT CONTRAST TECHNIQUE: Multidetector CT imaging of the cervical spine was performed without intravenous contrast. Multiplanar CT image reconstructions were also generated. COMPARISON:  None. FINDINGS: Alignment: Levocurvature.  No listhesis. Skull base and vertebrae: No acute fracture Soft tissues and spinal canal: No prevertebral fluid or swelling. No visible canal hematoma. Extensive atherosclerosis; there is history of end-stage renal disease. Disc levels: Generalized disc degeneration with large Schmorl's nodes in the C4 and C7 vertebrae. Facet arthropathy with erosive features or subchondral cystic change. Disc narrowing and uncovertebral spurring causes mid cervical foraminal narrowing most notable on the right at C4-5. Upper chest: Patchy air trapping IMPRESSION: Negative for fracture. Multilevel degenerative  disease. Electronically Signed   By: Monte Fantasia M.D.   On: 04/29/2019 19:16   Dg Chest Portable 1 View  Result Date: 04/29/2019 CLINICAL DATA:  Syncope, shortness of breath, cough EXAM: PORTABLE CHEST 1 VIEW COMPARISON:  04/20/2019 FINDINGS: Cardiomegaly, vascular congestion. Increasing left basilar atelectasis or infiltrate. No overt edema. No effusions or acute bony abnormality. IMPRESSION: Cardiomegaly, vascular congestion. Increasing left basilar atelectasis or infiltrate. Electronically Signed   By: Rolm Baptise M.D.   On: 04/29/2019 16:51   Dg Shoulder Left  Result Date: 04/29/2019 CLINICAL DATA:  Left shoulder pain after syncope, fall. EXAM: LEFT SHOULDER - 2+ VIEW COMPARISON:  None. FINDINGS: There is no evidence of fracture or dislocation. Minimal glenohumeral osteoarthritis. There is no evidence of arthropathy or other focal bone abnormality. Soft tissues are unremarkable. Vascular stent in the left axilla. IMPRESSION: No fracture or malalignment of the left shoulder. Electronically Signed   By: Keith Rake M.D.   On: 04/29/2019 19:18   ASSESSMENT AND PLAN:   Syncope- likely cough syncope, given that he passes out every time he has a severe coughing episode.  Recently hospitalized at [facility] from 8/13-8/15 and had an unremarkable syncope work-up. -Cardiac monitoring Cardiology consult.  A. fib with RVR.  Looks like new onset. Amiodarone 150 mg IV bolus now per Dr. Ubaldo Glassing.  Chronic cough- CHF or COPD. Follows with [facility] for his COPD. Recent RHC 03/30/19 showed very elevated filling pressures. -CXR with left  basilar atelectasis vs infiltrate -Check procalcitonin (although may be falsely high in ESRD) -Continue antitussives -Continue home inhalers -Duonebs prn.  Elevated troponin- likely demand ischemia.  Patient denies any active chest pain.  EKG with new T wave inversions in the inferior leads.  Troponins are stable.  Cardiology consult.  Left shoulder pain-  occurred after a fall. -Left shoulder x-ray: No fracture. -Gabapentin, flexeril, and tylenol prn  ESRD on HD MWF -Continue Sensipar and Phoslo The patient needed hemodialysis today due to pulmonary edema and hyperkalemia per Dr. Juleen China.  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 Chest x-ray report cardiomegaly with pulmonary vascular redistribution and mild interstitial edema. -Continue Entresto.  Continue hemodialysis.  Hyperkalemia.  Lokelma and hemodialysis, follow-up potassium.  Anemia of chronic kidney disease- hemoglobin at baseline  Tobacco use Smoking cessation was counseled for 3 to 4 minutes, nicotine patch prn  Discussed with Dr. Juleen China and Dr. Ubaldo Glassing. All the records are reviewed and case discussed with Care Management/Social Worker. Management plans discussed with the patient, family and they are in agreement.  CODE STATUS: Full Code  TOTAL TIME TAKING CARE OF THIS PATIENT: 38 minutes.   More than 50% of the time was spent in counseling/coordination of care: YES  POSSIBLE D/C IN 3 DAYS, DEPENDING ON CLINICAL CONDITION.   Demetrios Loll M.D on 04/30/2019 at 1:13 PM  Between 7am to 6pm - Pager - [phone]  After 6pm go to [email or web address] - [facility]

## 2019-04-30 NOTE — Progress Notes (Signed)
[facility]  ROUNDING NOTE   Subjective:   [name] admitted on 04/29/2019 for Syncope and collapse [R55] Abnormal EKG [R94.31] Elevated troponin [R79.89]  Patient was recently hospitalized at [facility] from 8/13-8/15 for syncope. Diagnosed with post-tussive syncope and acute pericarditis. Coughing was determined to be secondary to COPD, on going tobacco use, and pulmonary edema.   Last hemodialysis treatment was yesterday.   Found to have pulmonary edema with cough. Thinking that pulmonary edema could be contributing to his cough, patient was brought for an extra dialysis treatment this morning. Patient then had another syncopal episode. Patient found to have atrial fibrillation with rapid ventricular response. Patient's BFR reduced and changed to sequential.   Evaluated on hemodialysis. Continues to have intermittent coughing.   Objective:  Vital signs in last 24 hours:  Temp:  [98 F (36.7 C)-98.8 F (37.1 C)] 98.8 F (37.1 C) (08/22 1245) Pulse Rate:  [83-150] 140 (08/22 1315) Resp:  [18-29] 18 (08/22 1315) BP: (89-148)/(51-92) 93/51 (08/22 1315) SpO2:  [89 %-100 %] 99 % (08/22 1300) Weight:  [66.7 kg-70.3 kg] 66.7 kg (08/21 2300)  Weight change:  Filed Weights   04/29/19 1516 04/29/19 2300  Weight: 70.3 kg 66.7 kg    Intake/Output: No intake/output data recorded.   Intake/Output this shift:  Total I/O In: 250 [P.O.:250] Out: -   Physical Exam: General: NAD,   Head: Normocephalic, atraumatic. Moist oral mucosal membranes  Eyes: Anicteric, PERRL  Neck: Supple, trachea midline  Lungs:  Clear to auscultation  Heart: Regular rate and rhythm  Abdomen:  Soft, nontender,   Extremities:  no peripheral edema.  Neurologic: Nonfocal, moving all four extremities  Skin: No lesions  Access: Left AVF    Basic Metabolic Panel: Recent Labs  Lab 04/29/19 0945 04/29/19 1522 04/30/19 0559  NA  --  133* 138  K 4.2 4.2 5.3*  CL  --  101 103  CO2  --  22 23   GLUCOSE  --  289* 249*  BUN  --  38* 53*  CREATININE  --  5.83* 7.31*  CALCIUM  --  8.6* 8.6*    Liver Function Tests: No results for input(s): AST, ALT, ALKPHOS, BILITOT, PROT, ALBUMIN in the last 168 hours. No results for input(s): LIPASE, AMYLASE in the last 168 hours. No results for input(s): AMMONIA in the last 168 hours.  CBC: Recent Labs  Lab 04/29/19 1522 04/30/19 0559  WBC 7.6  7.6 10.5  NEUTROABS 6.1  --   HGB 9.4*  9.4* 10.0*  HCT 29.5*  29.6* 32.4*  MCV 95.5  95.2 97.0  PLT 375  342 401*    Cardiac Enzymes: No results for input(s): CKTOTAL, CKMB, CKMBINDEX, TROPONINI in the last 168 hours.  BNP: Invalid input(s): POCBNP  CBG: No results for input(s): GLUCAP in the last 168 hours.  Microbiology: Results for orders placed or performed during the hospital encounter of 04/29/19  SARS Coronavirus 2 [facility] order, Performed in [facility] hospital lab) Nasopharyngeal Nasopharyngeal Swab     Status: None   Collection Time: 04/29/19  6:03 PM   Specimen: Nasopharyngeal Swab  Result Value Ref Range Status   SARS Coronavirus 2 NEGATIVE NEGATIVE Final    Comment: (NOTE) If result is NEGATIVE SARS-CoV-2 target nucleic acids are NOT DETECTED. The SARS-CoV-2 RNA is generally detectable in upper and lower  respiratory specimens during the acute phase of infection. The lowest  concentration of SARS-CoV-2 viral copies this assay can detect is 250  copies / mL. A negative result does not preclude SARS-CoV-2 infection  and should not be used as the sole basis for treatment or other  patient management decisions.  A negative result may occur with  improper specimen collection / handling, submission of specimen other  than nasopharyngeal swab, presence of viral mutation(s) within the  areas targeted by this assay, and inadequate number of viral copies  (<250 copies / mL). A negative result must be combined with clinical  observations, patient history, and  epidemiological information. If result is POSITIVE SARS-CoV-2 target nucleic acids are DETECTED. The SARS-CoV-2 RNA is generally detectable in upper and lower  respiratory specimens dur ing the acute phase of infection.  Positive  results are indicative of active infection with SARS-CoV-2.  Clinical  correlation with patient history and other diagnostic information is  necessary to determine patient infection status.  Positive results do  not rule out bacterial infection or co-infection with other viruses. If result is PRESUMPTIVE POSTIVE SARS-CoV-2 nucleic acids MAY BE PRESENT.   A presumptive positive result was obtained on the submitted specimen  and confirmed on repeat testing.  While 2019 novel coronavirus  (SARS-CoV-2) nucleic acids may be present in the submitted sample  additional confirmatory testing may be necessary for epidemiological  and / or clinical management purposes  to differentiate between  SARS-CoV-2 and other Sarbecovirus currently known to infect humans.  If clinically indicated additional testing with an alternate test  methodology [phone]) is advised. The SARS-CoV-2 RNA is generally  detectable in upper and lower respiratory sp ecimens during the acute  phase of infection. The expected result is Negative. Fact Sheet for Patients:  StrictlyIdeas.no Fact Sheet for Healthcare Providers: BankingDealers.co.za This test is not yet approved or cleared by the [place] FDA and has been authorized for detection and/or diagnosis of SARS-CoV-2 by FDA under an Emergency Use Authorization (EUA).  This EUA will remain in effect (meaning this test can be used) for the duration of the COVID-19 declaration under Section 564(b)(1) of the Act, 21 U.S.C. section 360bbb-3(b)(1), unless the authorization is terminated or revoked sooner. Performed at [facility], [address] [place], [place] [address]      Coagulation Studies: No results for input(s): LABPROT, INR in the last 72 hours.  Urinalysis: No results for input(s): COLORURINE, LABSPEC, PHURINE, GLUCOSEU, HGBUR, BILIRUBINUR, KETONESUR, PROTEINUR, UROBILINOGEN, NITRITE, LEUKOCYTESUR in the last 72 hours.  Invalid input(s): APPERANCEUR    Imaging: Dg Chest 2 View  Result Date: 04/30/2019 CLINICAL DATA:  Cough and shortness of breath EXAM: CHEST - 2 VIEW COMPARISON:  Chest radiograph 04/29/2019 FINDINGS: Monitoring lead overlies the patient. Stable cardiomegaly. Mild bilateral interstitial pulmonary opacities. Retrocardiac consolidation. Possible small left pleural effusion. No pneumothorax. Stent material over the left axilla. IMPRESSION: Cardiomegaly with pulmonary vascular redistribution and mild interstitial edema. Retrocardiac consolidation may represent small left pleural effusion and underlying atelectasis or infection. Electronically Signed   By: Lovey Newcomer M.D.   On: 04/30/2019 11:04   Ct Head Wo Contrast  Result Date: 04/29/2019 CLINICAL DATA:  Head trauma, syncopal episode EXAM: CT HEAD WITHOUT CONTRAST TECHNIQUE: Contiguous axial images were obtained from the base of the skull through the vertex without intravenous contrast. COMPARISON:  02/14/2019 FINDINGS: Brain: No evidence of acute infarction, hemorrhage, extra-axial collection, ventriculomegaly, or mass effect. Generalized cerebral atrophy. Periventricular white matter low attenuation likely secondary to microangiopathy. Vascular: Cerebrovascular atherosclerotic calcifications are noted. Skull: Negative for fracture or focal lesion. Sinuses/Orbits: Visualized portions of the orbits  are unremarkable. Visualized portions of the paranasal sinuses and mastoid air cells are unremarkable. Other: None. IMPRESSION: No acute intracranial pathology. Electronically Signed   By: Kathreen Devoid   On: 04/29/2019 16:46   Ct Cervical Spine Wo Contrast  Result Date: 04/29/2019 CLINICAL  DATA:  Cervical spine trauma due to syncope. Right-sided neck pain EXAM: CT CERVICAL SPINE WITHOUT CONTRAST TECHNIQUE: Multidetector CT imaging of the cervical spine was performed without intravenous contrast. Multiplanar CT image reconstructions were also generated. COMPARISON:  None. FINDINGS: Alignment: Levocurvature.  No listhesis. Skull base and vertebrae: No acute fracture Soft tissues and spinal canal: No prevertebral fluid or swelling. No visible canal hematoma. Extensive atherosclerosis; there is history of end-stage renal disease. Disc levels: Generalized disc degeneration with large Schmorl's nodes in the C4 and C7 vertebrae. Facet arthropathy with erosive features or subchondral cystic change. Disc narrowing and uncovertebral spurring causes mid cervical foraminal narrowing most notable on the right at C4-5. Upper chest: Patchy air trapping IMPRESSION: Negative for fracture. Multilevel degenerative disease. Electronically Signed   By: Monte Fantasia M.D.   On: 04/29/2019 19:16   Dg Chest Portable 1 View  Result Date: 04/29/2019 CLINICAL DATA:  Syncope, shortness of breath, cough EXAM: PORTABLE CHEST 1 VIEW COMPARISON:  04/20/2019 FINDINGS: Cardiomegaly, vascular congestion. Increasing left basilar atelectasis or infiltrate. No overt edema. No effusions or acute bony abnormality. IMPRESSION: Cardiomegaly, vascular congestion. Increasing left basilar atelectasis or infiltrate. Electronically Signed   By: Rolm Baptise M.D.   On: 04/29/2019 16:51   Dg Shoulder Left  Result Date: 04/29/2019 CLINICAL DATA:  Left shoulder pain after syncope, fall. EXAM: LEFT SHOULDER - 2+ VIEW COMPARISON:  None. FINDINGS: There is no evidence of fracture or dislocation. Minimal glenohumeral osteoarthritis. There is no evidence of arthropathy or other focal bone abnormality. Soft tissues are unremarkable. Vascular stent in the left axilla. IMPRESSION: No fracture or malalignment of the left shoulder. Electronically  Signed   By: Keith Rake M.D.   On: 04/29/2019 19:18     Medications:   . amiodarone     . benzonatate  200 mg Oral TID  . calcium acetate  667 mg Oral TID WC  . Chlorhexidine Gluconate Cloth  6 each Topical Daily  . [START ON 05/02/2019] cinacalcet  30 mg Oral 3 times weekly  . fluticasone  2 spray Each Nare Daily  . heparin  5,000 Units Subcutaneous Q8H  . pantoprazole  40 mg Oral Daily  . sacubitril-valsartan  1 tablet Oral BID  . sevelamer carbonate  1,600 mg Oral TID WC  . sodium zirconium cyclosilicate  5 g Oral Daily  . umeclidinium-vilanterol  1 puff Inhalation Daily   acetaminophen **OR** acetaminophen, albuterol, cyclobenzaprine, gabapentin, guaiFENesin-codeine, ipratropium-albuterol, morphine injection, ondansetron **OR** ondansetron (ZOFRAN) IV, oxyCODONE, polyethylene glycol  Assessment/ Plan:  [name] is a 58 y.o. black male with end stage renal disease on hemodialysis, hypertension, congestive heart failure, diabetes mellitus type II, history of substance abuse, COPD with ongoing tobacco use  [facility] MWF 65kg 180 minutes 2K bath  1. End stage renal disease: last hemodialysis treatment yesterday. However with cough and basilar crackles on examination.  - Extra dialysis treatment today. Sequential only. UF goal of 2-3 liters. Ultrafiltration may be limited by patient's blood pressure.  - Discontinue sodium zirconium   2. Syncope: diagnosed as post-tussive syncope. However with witnessed syncopal episode with no cough by the dialysis staff.  - now with atrial fibrillation with rapid ventricular response.  Patient states he had an ablation in the past.  - not currently on any rate controlling agents.  - Appreciate cardiology input.  Amiodarone ordered.   3. Anemia of chronic kidney disease: hemoglobin 10.  - EPO on MWF   4. Secondary Hyperparathyroidism: outpatient labs on 8/17 PTH 3948, phos 7.8, calcium 8.6  - Calcium acetate with  meals.  - Cinacalcet three times a week   LOS: 0 Korry Dalgleish 8/22/20201:24 PM

## 2019-04-30 NOTE — Progress Notes (Signed)
This note also relates to the following rows which could not be included: Resp - Cannot attach notes to unvalidated device data BP - Cannot attach notes to unvalidated device data  Patient afib 140's-160's. Visualized episode where patient appeared to be seizing. Dr. Juleen China notified. BFR t o remain at 222ml min/ SEQ treatment for the remainder of treatment. Target at 1532ml until blood pressure improves.

## 2019-05-01 LAB — BASIC METABOLIC PANEL
Anion gap: 19 — ABNORMAL HIGH (ref 5–15)
Anion gap: 14 (ref 5–15)
BUN: 66 mg/dL — ABNORMAL HIGH (ref 6–20)
BUN: 34 mg/dL — ABNORMAL HIGH (ref 6–20)
CO2: 19 mmol/L — ABNORMAL LOW (ref 22–32)
CO2: 27 mmol/L (ref 22–32)
Calcium: 7.8 mg/dL — ABNORMAL LOW (ref 8.9–10.3)
Calcium: 8.2 mg/dL — ABNORMAL LOW (ref 8.9–10.3)
Chloride: 97 mmol/L — ABNORMAL LOW (ref 98–111)
Chloride: 97 mmol/L — ABNORMAL LOW (ref 98–111)
Creatinine, Ser: 8.61 mg/dL — ABNORMAL HIGH (ref 0.61–1.24)
Creatinine, Ser: 4.53 mg/dL — ABNORMAL HIGH (ref 0.61–1.24)
GFR calc Af Amer: 7 mL/min — ABNORMAL LOW (ref >60)
GFR calc Af Amer: 16 mL/min — ABNORMAL LOW (ref >60)
GFR calc non Af Amer: 6 mL/min — ABNORMAL LOW (ref >60)
GFR calc non Af Amer: 13 mL/min — ABNORMAL LOW (ref >60)
Glucose, Bld: 297 mg/dL — ABNORMAL HIGH (ref 70–99)
Glucose, Bld: 198 mg/dL — ABNORMAL HIGH (ref 70–99)
Potassium: 6 mmol/L — ABNORMAL HIGH (ref 3.5–5.1)
Potassium: 3.5 mmol/L (ref 3.5–5.1)
Sodium: 135 mmol/L (ref 135–145)
Sodium: 138 mmol/L (ref 135–145)

## 2019-05-01 MED ORDER — POLYVINYL ALCOHOL 1.4 % OP SOLN
1.0000 [drp] | Freq: Three times a day (TID) | OPHTHALMIC | Status: DC
  Administered 2019-05-01 – 2019-05-12 (×28): 1 [drp] via OPHTHALMIC
  Filled 2019-05-01: qty 15

## 2019-05-01 MED ORDER — SODIUM ZIRCONIUM CYCLOSILICATE 10 G PO PACK
10.0000 g | PACK | Freq: Every day | ORAL | Status: DC
  Administered 2019-05-01: 10 g via ORAL
  Filled 2019-05-01: qty 1

## 2019-05-01 MED ORDER — DILTIAZEM HCL 30 MG PO TABS
60.0000 mg | ORAL_TABLET | Freq: Once | ORAL | Status: AC
  Administered 2019-05-01: 60 mg via ORAL
  Filled 2019-05-01: qty 2

## 2019-05-01 MED ORDER — AMIODARONE HCL 200 MG PO TABS
200.0000 mg | ORAL_TABLET | Freq: Two times a day (BID) | ORAL | Status: DC
  Administered 2019-05-01 – 2019-05-12 (×22): 200 mg via ORAL
  Filled 2019-05-01 (×23): qty 1

## 2019-05-01 NOTE — Progress Notes (Signed)
Report given to [facility] . Pt sent to dialysis.

## 2019-05-01 NOTE — Progress Notes (Addendum)
[facility]  ROUNDING NOTE   Subjective:   Hemodialysis treatment yesterday. Extra treatment - sequential only. UF of 2 liters.   Today patient continues to have cough. Reports syncopal episodes yesterday. Requesting an extra dialysis treatment.   Amiodarone bolus yesterday. Patient has converted to sinus rhythm  Objective:  Vital signs in last 24 hours:  Temp:  [97.5 F (36.4 C)-98.8 F (37.1 C)] 98.5 F (36.9 C) (08/23 0758) Pulse Rate:  [72-151] 72 (08/23 1058) Resp:  [16-29] 19 (08/23 1058) BP: (75-127)/(51-112) 117/52 (08/23 1058) SpO2:  [85 %-100 %] 100 % (08/23 1058) Weight:  [65.8 kg-67 kg] 66.2 kg (08/23 0310)  Weight change: -3.308 kg Filed Weights   04/30/19 1240 04/30/19 1617 05/01/19 0310  Weight: 67 kg 65.8 kg 66.2 kg    Intake/Output: I/O last 3 completed shifts: In: 619.3 [P.O.:250; I.V.:369.3] Out: 2000 [Other:2000]   Intake/Output this shift:  No intake/output data recorded.  Physical Exam: General: NAD,   Head: Normocephalic, atraumatic. Moist oral mucosal membranes  Eyes: Anicteric, PERRL  Neck: Supple,    Lungs:  Basilar crackles  Heart: Regular rate and rhythm  Abdomen:  Soft, nontender,   Extremities:  no peripheral edema.  Neurologic: Nonfocal, moving all four extremities  Skin: No lesions  Access: Left AVF    Basic Metabolic Panel: Recent Labs  Lab 04/29/19 0945  04/29/19 1522 04/30/19 0559 04/30/19 1846 05/01/19 0710  NA  --   --  133* 138 137 135  K 4.2  --  4.2 5.3* 5.7* 6.0*  CL  --   --  101 103 100 97*  CO2  --   --  22 23 21* 19*  GLUCOSE  --   --  289* 249* 270* 297*  BUN  --   --  38* 53* 58* 66*  CREATININE  --   --  5.83* 7.31* 7.54* 8.61*  CALCIUM  --    < > 8.6* 8.6* 8.4* 7.8*   < > = values in this interval not displayed.    Liver Function Tests: No results for input(s): AST, ALT, ALKPHOS, BILITOT, PROT, ALBUMIN in the last 168 hours. No results for input(s): LIPASE, AMYLASE in the last 168  hours. No results for input(s): AMMONIA in the last 168 hours.  CBC: Recent Labs  Lab 04/29/19 1522 04/30/19 0559  WBC 7.6  7.6 10.5  NEUTROABS 6.1  --   HGB 9.4*  9.4* 10.0*  HCT 29.5*  29.6* 32.4*  MCV 95.5  95.2 97.0  PLT 375  342 401*    Cardiac Enzymes: No results for input(s): CKTOTAL, CKMB, CKMBINDEX, TROPONINI in the last 168 hours.  BNP: Invalid input(s): POCBNP  CBG: No results for input(s): GLUCAP in the last 168 hours.  Microbiology: Results for orders placed or performed during the hospital encounter of 04/29/19  SARS Coronavirus 2 [facility] order, Performed in [facility] hospital lab) Nasopharyngeal Nasopharyngeal Swab     Status: None   Collection Time: 04/29/19  6:03 PM   Specimen: Nasopharyngeal Swab  Result Value Ref Range Status   SARS Coronavirus 2 NEGATIVE NEGATIVE Final    Comment: (NOTE) If result is NEGATIVE SARS-CoV-2 target nucleic acids are NOT DETECTED. The SARS-CoV-2 RNA is generally detectable in upper and lower  respiratory specimens during the acute phase of infection. The lowest  concentration of SARS-CoV-2 viral copies this assay can detect is 250  copies / mL. A negative result does not preclude SARS-CoV-2 infection  and should not  be used as the sole basis for treatment or other  patient management decisions.  A negative result may occur with  improper specimen collection / handling, submission of specimen other  than nasopharyngeal swab, presence of viral mutation(s) within the  areas targeted by this assay, and inadequate number of viral copies  (<250 copies / mL). A negative result must be combined with clinical  observations, patient history, and epidemiological information. If result is POSITIVE SARS-CoV-2 target nucleic acids are DETECTED. The SARS-CoV-2 RNA is generally detectable in upper and lower  respiratory specimens dur ing the acute phase of infection.  Positive  results are indicative of active infection with  SARS-CoV-2.  Clinical  correlation with patient history and other diagnostic information is  necessary to determine patient infection status.  Positive results do  not rule out bacterial infection or co-infection with other viruses. If result is PRESUMPTIVE POSTIVE SARS-CoV-2 nucleic acids MAY BE PRESENT.   A presumptive positive result was obtained on the submitted specimen  and confirmed on repeat testing.  While 2019 novel coronavirus  (SARS-CoV-2) nucleic acids may be present in the submitted sample  additional confirmatory testing may be necessary for epidemiological  and / or clinical management purposes  to differentiate between  SARS-CoV-2 and other Sarbecovirus currently known to infect humans.  If clinically indicated additional testing with an alternate test  methodology [phone]) is advised. The SARS-CoV-2 RNA is generally  detectable in upper and lower respiratory sp ecimens during the acute  phase of infection. The expected result is Negative. Fact Sheet for Patients:  StrictlyIdeas.no Fact Sheet for Healthcare Providers: BankingDealers.co.za This test is not yet approved or cleared by the [place] FDA and has been authorized for detection and/or diagnosis of SARS-CoV-2 by FDA under an Emergency Use Authorization (EUA).  This EUA will remain in effect (meaning this test can be used) for the duration of the COVID-19 declaration under Section 564(b)(1) of the Act, 21 U.S.C. section 360bbb-3(b)(1), unless the authorization is terminated or revoked sooner. Performed at [facility], [address] [place], [place] [address]     Coagulation Studies: No results for input(s): LABPROT, INR in the last 72 hours.  Urinalysis: No results for input(s): COLORURINE, LABSPEC, PHURINE, GLUCOSEU, HGBUR, BILIRUBINUR, KETONESUR, PROTEINUR, UROBILINOGEN, NITRITE, LEUKOCYTESUR in the last 72 hours.  Invalid input(s):  APPERANCEUR    Imaging: Dg Chest 2 View  Result Date: 04/30/2019 CLINICAL DATA:  Cough and shortness of breath EXAM: CHEST - 2 VIEW COMPARISON:  Chest radiograph 04/29/2019 FINDINGS: Monitoring lead overlies the patient. Stable cardiomegaly. Mild bilateral interstitial pulmonary opacities. Retrocardiac consolidation. Possible small left pleural effusion. No pneumothorax. Stent material over the left axilla. IMPRESSION: Cardiomegaly with pulmonary vascular redistribution and mild interstitial edema. Retrocardiac consolidation may represent small left pleural effusion and underlying atelectasis or infection. Electronically Signed   By: Lovey Newcomer M.D.   On: 04/30/2019 11:04   Ct Head Wo Contrast  Result Date: 04/29/2019 CLINICAL DATA:  Head trauma, syncopal episode EXAM: CT HEAD WITHOUT CONTRAST TECHNIQUE: Contiguous axial images were obtained from the base of the skull through the vertex without intravenous contrast. COMPARISON:  02/14/2019 FINDINGS: Brain: No evidence of acute infarction, hemorrhage, extra-axial collection, ventriculomegaly, or mass effect. Generalized cerebral atrophy. Periventricular white matter low attenuation likely secondary to microangiopathy. Vascular: Cerebrovascular atherosclerotic calcifications are noted. Skull: Negative for fracture or focal lesion. Sinuses/Orbits: Visualized portions of the orbits are unremarkable. Visualized portions of the paranasal sinuses and mastoid air cells are unremarkable. Other:  None. IMPRESSION: No acute intracranial pathology. Electronically Signed   By: Kathreen Devoid   On: 04/29/2019 16:46   Ct Cervical Spine Wo Contrast  Result Date: 04/29/2019 CLINICAL DATA:  Cervical spine trauma due to syncope. Right-sided neck pain EXAM: CT CERVICAL SPINE WITHOUT CONTRAST TECHNIQUE: Multidetector CT imaging of the cervical spine was performed without intravenous contrast. Multiplanar CT image reconstructions were also generated. COMPARISON:  None.  FINDINGS: Alignment: Levocurvature.  No listhesis. Skull base and vertebrae: No acute fracture Soft tissues and spinal canal: No prevertebral fluid or swelling. No visible canal hematoma. Extensive atherosclerosis; there is history of end-stage renal disease. Disc levels: Generalized disc degeneration with large Schmorl's nodes in the C4 and C7 vertebrae. Facet arthropathy with erosive features or subchondral cystic change. Disc narrowing and uncovertebral spurring causes mid cervical foraminal narrowing most notable on the right at C4-5. Upper chest: Patchy air trapping IMPRESSION: Negative for fracture. Multilevel degenerative disease. Electronically Signed   By: Monte Fantasia M.D.   On: 04/29/2019 19:16   Dg Chest Portable 1 View  Result Date: 04/29/2019 CLINICAL DATA:  Syncope, shortness of breath, cough EXAM: PORTABLE CHEST 1 VIEW COMPARISON:  04/20/2019 FINDINGS: Cardiomegaly, vascular congestion. Increasing left basilar atelectasis or infiltrate. No overt edema. No effusions or acute bony abnormality. IMPRESSION: Cardiomegaly, vascular congestion. Increasing left basilar atelectasis or infiltrate. Electronically Signed   By: Rolm Baptise M.D.   On: 04/29/2019 16:51   Dg Shoulder Left  Result Date: 04/29/2019 CLINICAL DATA:  Left shoulder pain after syncope, fall. EXAM: LEFT SHOULDER - 2+ VIEW COMPARISON:  None. FINDINGS: There is no evidence of fracture or dislocation. Minimal glenohumeral osteoarthritis. There is no evidence of arthropathy or other focal bone abnormality. Soft tissues are unremarkable. Vascular stent in the left axilla. IMPRESSION: No fracture or malalignment of the left shoulder. Electronically Signed   By: Keith Rake M.D.   On: 04/29/2019 19:18     Medications:    . amiodarone  200 mg Oral BID  . benzonatate  200 mg Oral TID  . calcium acetate  667 mg Oral TID WC  . Chlorhexidine Gluconate Cloth  6 each Topical Daily  . [START ON 05/02/2019] cinacalcet  30 mg Oral  3 times weekly  . fluticasone  2 spray Each Nare Daily  . heparin  5,000 Units Subcutaneous Q8H  . pantoprazole  40 mg Oral Daily  . sevelamer carbonate  1,600 mg Oral TID WC  . sodium zirconium cyclosilicate  10 g Oral Daily  . umeclidinium-vilanterol  1 puff Inhalation Daily   acetaminophen **OR** acetaminophen, albuterol, cyclobenzaprine, gabapentin, guaiFENesin-codeine, ipratropium-albuterol, morphine injection, ondansetron **OR** ondansetron (ZOFRAN) IV, oxyCODONE, polyethylene glycol  Assessment/ Plan:  [name] is a 58 y.o. black male with end stage renal disease on hemodialysis, hypertension, congestive heart failure, diabetes mellitus type II, history of substance abuse, COPD with ongoing tobacco use  [facility] MWF 65kg 180 minutes 2K bath  1. End stage renal disease with hyperkalemia: hemodialysis treatment on Friday and Saturday. However continues to have pulmonary edema on examination  - Extra dialysis treatment today. Orders prepared.   2. Syncope: diagnosed as post-tussive syncope at [facility].   Hypotensive on this admission.  Atrial fibrillation with rapid ventricular response treated with amiodarone. Converted to normal sinus rhythm.  - Appreciate cardiology input.    3. Anemia of chronic kidney disease: hemoglobin 10.  - EPO on MWF   4. Secondary Hyperparathyroidism: outpatient labs on 8/17 PTH 3948, phos  7.8, calcium 8.6  - Calcium acetate with meals.  - Cinacalcet three times a week   LOS: 1 [place] Liz 8/23/202011:51 AM

## 2019-05-01 NOTE — Progress Notes (Signed)
This note also relates to the following rows which could not be included: Pulse Rate - Cannot attach notes to unvalidated device data Resp - Cannot attach notes to unvalidated device data  HD [place]. Tolerated tx without complications. 2057ml fluid removal.

## 2019-05-01 NOTE — Progress Notes (Signed)
Summoned to room by bed alarm. Pt prone on floor. Assisted to bed. Pt states he"wassitting at bedside and while reaching for apple juice, slipped to floor, and bumped head. No skin break noted. Pt alert and oriented. Dr Bridgett Larsson notified. Seen by him.

## 2019-05-01 NOTE — Progress Notes (Signed)
Summoned to room. Pt complaining of "itchy rt eye." given ice water compress. Nephew at bedside. I gave him update and answered his questions.

## 2019-05-01 NOTE — Progress Notes (Signed)
PRE-HD MACHINE CHECK/HEP

## 2019-05-01 NOTE — Progress Notes (Signed)
PT Cancellation Note  Patient Details Name: Jeremy [name] MRN: [id number] DOB: 1961-08-12   Cancelled Treatment:    Reason Eval/Treat Not Completed: Medical issues which prohibited therapy(Per chart review, most recent K+: 6.0, outside of recommended safe range for PT services. Will continue to monitor and evaluate once pt is medically ready.)  9:26 AM, 05/01/19 Etta Grandchild, PT, DPT Physical Therapist - [facility]  [phone] ([name])     [place] C 05/01/2019, 9:26 AM

## 2019-05-01 NOTE — Consult Note (Signed)
Cardiology Consultation Note    Patient ID: Jeremy [name], MRN: [id number], DOB/AGE: August 23, 1961 58 y.o. Admit date: 04/29/2019   Date of Consult: 05/01/2019 Primary Physician: Patient, No Pcp Per Primary Cardiologist: Dr. Clayborn Bigness  Chief Complaint: sncope Reason for Consultation: syncope Requesting MD: Dr. Bridgett Larsson  HPI: Jeremy [name] is a 58 y.o. male with history of end-stage renal disease on hemodialysis 3 times weekly, hypertension, diabetes with newly diagnosed heart failure with reduced LV function with an echo showing an EF of 30% with very echogenic myocardium.  He presented to the emergency room with complaints of syncope.  He had extensive work-up as an outpatient previously Due to the abnormal echocardiogram, concern over possible amyloid heart was raised.  He underwent right heart cath and endomyocardial biopsy at [facility] last week.  This revealed a cardiac index of 2.9 L/min/m with right ventricular systolic pressure of 88 mmHg pulmonary artery mean pressure of 55 mmHg wedge pressure of 26 mmHg with a PVR of 5.8 Wood units.  Endomyocardial biopsy thus far has revealed no evidence of amyloid on routine and [place] red stains.  There was no evident sarcoid, myocarditis or ischemic infarct.  Electromicroscopy is pending.  Echocardiogram done at [facility] on second 2020 revealed ejection fraction of 30% with moderate MR moderate TR trivial AI.  And showed echogenicity consistent with possible infiltrative disease.  Estimated RV systolic pressure by echo was 56.3 mmHg.  Patient has a several month history of syncope.    He was admitted recently with similar complaints of cough syncope especially after missing several hemodialysis appointments.  On discussion with the patient, he states these occurred during coughing episodes.  He states that when he has he had extensive coughing paroxysms, he often has a syncopal episode.  He denies any syncopal episodes other than  during this time.  He states that he missed 2 episodes hemodialysis in the last week which was the case on his previous admission. He presented to the emergency room for the syncopal episodes.  Laboratories on presentation revealed high-sensitivity troponin of 145 153 and 149.  He also was noted to have atrial fibrillation with rapid ventricular response during hemodialysis.  He was given IV amiodarone with conversion back to sinus rhythm where he stays at present.  Past Medical History:  Diagnosis Date  . Bronchitis   . Hypertension   . Renal disorder    kidney failure, dialysis      Surgical History:  Past Surgical History:  Procedure Laterality Date  . DIALYSIS FISTULA CREATION       Home Meds: Prior to Admission medications   Medication Sig Start Date End Date Taking? Authorizing Provider  albuterol (PROVENTIL HFA;VENTOLIN HFA) 108 (90 Base) MCG/ACT inhaler Inhale 2 puffs into the lungs every 6 (six) hours as needed for wheezing or shortness of breath. 11/19/18  Yes Nena Polio, MD  benzonatate (TESSALON) 200 MG capsule Take 1 capsule (200 mg total) by mouth 3 (three) times daily. 04/04/19  Yes Dustin Flock, MD  Budesonide 90 MCG/ACT inhaler Inhale 1 puff into the lungs 2 (two) times daily. 04/23/19 04/22/20 Yes [provider]  calcium acetate (PHOSLO) 667 MG capsule Take 667 mg by mouth 3 (three) times daily with meals.    Yes [provider]  cinacalcet (SENSIPAR) 30 MG tablet Take 30 mg by mouth 3 (three) times a week. 09/04/15  Yes [provider]  fluticasone (FLONASE) 50 MCG/ACT nasal spray Place 2 sprays  into both nostrils daily. 04/05/19  Yes Dustin Flock, MD  gabapentin (NEURONTIN) 100 MG capsule Take 1 capsule (100 mg total) by mouth daily as needed (pain). Patient taking differently: Take 300 mg by mouth at bedtime.  11/22/18  Yes Mody, Ulice Bold, MD  guaiFENesin-codeine 100-10 MG/5ML syrup Take 10 mLs by mouth every 4 (four) hours as needed for  cough. 04/04/19  Yes Dustin Flock, MD  Multiple Vitamin (MULTIVITAMIN WITH MINERALS) TABS tablet Take 1 tablet by mouth daily.   Yes [provider]  nicotine (NICODERM CQ - DOSED IN MG/24 HOURS) 21 mg/24hr patch Place 1 patch onto the skin daily. 04/23/19 07/26/19 Yes [provider]  nicotine polacrilex (NICORETTE) 4 MG gum Take 4 mg by mouth as directed. 04/23/19 07/28/19 Yes [provider]  pantoprazole (PROTONIX) 40 MG tablet Take 1 tablet (40 mg total) by mouth daily. 04/05/19  Yes Dustin Flock, MD  sacubitril-valsartan (ENTRESTO) 24-26 MG Take 1 tablet by mouth 2 (two) times a day. 03/03/19  Yes [provider]  umeclidinium-vilanterol (ANORO ELLIPTA) 62.5-25 MCG/INH AEPB Inhale 1 puff into the lungs daily. 02/02/19  Yes [provider]    Inpatient Medications:  . amiodarone  200 mg Oral BID  . benzonatate  200 mg Oral TID  . calcium acetate  667 mg Oral TID WC  . Chlorhexidine Gluconate Cloth  6 each Topical Daily  . [START ON 05/02/2019] cinacalcet  30 mg Oral 3 times weekly  . fluticasone  2 spray Each Nare Daily  . heparin  5,000 Units Subcutaneous Q8H  . pantoprazole  40 mg Oral Daily  . sevelamer carbonate  1,600 mg Oral TID WC  . sodium zirconium cyclosilicate  10 g Oral Daily  . umeclidinium-vilanterol  1 puff Inhalation Daily     Allergies: No Known Allergies  Social History   Socioeconomic History  . Marital status: Single    Spouse name: Not on file  . Number of children: Not on file  . Years of education: Not on file  . Highest education level: Not on file  Occupational History  . Not on file  Social Needs  . Financial resource strain: Not on file  . Food insecurity    Worry: Not on file    Inability: Not on file  . Transportation needs    Medical: Not on file    Non-medical: Not on file  Tobacco Use  . Smoking status: Current Every Day Smoker    Packs/day: 1.00    Types: Cigarettes  . Smokeless tobacco:  Never Used  Substance and Sexual Activity  . Alcohol use: Never    Frequency: Never  . Drug use: Yes    Types: Marijuana  . Sexual activity: Not on file  Lifestyle  . Physical activity    Days per week: Not on file    Minutes per session: Not on file  . Stress: Not on file  Relationships  . Social Herbalist on phone: Not on file    Gets together: Not on file    Attends religious service: Not on file    Active member of club or organization: Not on file    Attends meetings of clubs or organizations: Not on file    Relationship status: Not on file  . Intimate partner violence    Fear of current or ex partner: Not on file    Emotionally abused: Not on file    Physically abused: Not on file  Forced sexual activity: Not on file  Other Topics Concern  . Not on file  Social History Narrative  . Not on file     Family History  Problem Relation Age of Onset  . Hypertension Mother      Review of Systems: A 12-system review of systems was performed and is negative except as noted in the HPI.  Labs: No results for input(s): CKTOTAL, CKMB, TROPONINI in the last 72 hours. Lab Results  Component Value Date   WBC 10.5 04/30/2019   HGB 10.0 (L) 04/30/2019   HCT 32.4 (L) 04/30/2019   MCV 97.0 04/30/2019   PLT 401 (H) 04/30/2019    Recent Labs  Lab 05/01/19 0710  NA 135  K 6.0*  CL 97*  CO2 19*  BUN 66*  CREATININE 8.61*  CALCIUM 7.8*  GLUCOSE 297*   No results found for: CHOL, HDL, LDLCALC, TRIG No results found for: DDIMER  Radiology/Studies:  Dg Chest 2 View  Result Date: 04/30/2019 CLINICAL DATA:  Cough and shortness of breath EXAM: CHEST - 2 VIEW COMPARISON:  Chest radiograph 04/29/2019 FINDINGS: Monitoring lead overlies the patient. Stable cardiomegaly. Mild bilateral interstitial pulmonary opacities. Retrocardiac consolidation. Possible small left pleural effusion. No pneumothorax. Stent material over the left axilla. IMPRESSION: Cardiomegaly with  pulmonary vascular redistribution and mild interstitial edema. Retrocardiac consolidation may represent small left pleural effusion and underlying atelectasis or infection. Electronically Signed   By: Lovey Newcomer M.D.   On: 04/30/2019 11:04   Dg Chest 2 View  Result Date: 04/20/2019 CLINICAL DATA:  Chest pain EXAM: CHEST - 2 VIEW COMPARISON:  04/02/2019 FINDINGS: Cardiomegaly. No pleural effusion. Further clearing of right lung base infiltrate. Streaky basilar opacities, likely atelectasis. Aortic atherosclerosis. No pneumothorax. Left subclavian region stent. IMPRESSION: 1. Cardiomegaly without edema or pleural effusion 2. Further clearing of previously noted right lower lobe infiltrate Electronically Signed   By: Donavan Foil M.D.   On: 04/20/2019 17:11   Ct Head Wo Contrast  Result Date: 04/29/2019 CLINICAL DATA:  Head trauma, syncopal episode EXAM: CT HEAD WITHOUT CONTRAST TECHNIQUE: Contiguous axial images were obtained from the base of the skull through the vertex without intravenous contrast. COMPARISON:  02/14/2019 FINDINGS: Brain: No evidence of acute infarction, hemorrhage, extra-axial collection, ventriculomegaly, or mass effect. Generalized cerebral atrophy. Periventricular white matter low attenuation likely secondary to microangiopathy. Vascular: Cerebrovascular atherosclerotic calcifications are noted. Skull: Negative for fracture or focal lesion. Sinuses/Orbits: Visualized portions of the orbits are unremarkable. Visualized portions of the paranasal sinuses and mastoid air cells are unremarkable. Other: None. IMPRESSION: No acute intracranial pathology. Electronically Signed   By: Kathreen Devoid   On: 04/29/2019 16:46   Ct Cervical Spine Wo Contrast  Result Date: 04/29/2019 CLINICAL DATA:  Cervical spine trauma due to syncope. Right-sided neck pain EXAM: CT CERVICAL SPINE WITHOUT CONTRAST TECHNIQUE: Multidetector CT imaging of the cervical spine was performed without intravenous contrast.  Multiplanar CT image reconstructions were also generated. COMPARISON:  None. FINDINGS: Alignment: Levocurvature.  No listhesis. Skull base and vertebrae: No acute fracture Soft tissues and spinal canal: No prevertebral fluid or swelling. No visible canal hematoma. Extensive atherosclerosis; there is history of end-stage renal disease. Disc levels: Generalized disc degeneration with large Schmorl's nodes in the C4 and C7 vertebrae. Facet arthropathy with erosive features or subchondral cystic change. Disc narrowing and uncovertebral spurring causes mid cervical foraminal narrowing most notable on the right at C4-5. Upper chest: Patchy air trapping IMPRESSION: Negative for fracture. Multilevel degenerative disease. Electronically  Signed   By: Monte Fantasia M.D.   On: 04/29/2019 19:16   US Carotid Bilateral  Result Date: 04/03/2019 CLINICAL DATA:  Syncopal episode. History of hypertension and smoking. History of end-stage renal disease, on dialysis. EXAM: BILATERAL CAROTID DUPLEX ULTRASOUND TECHNIQUE: Pearline Cables scale imaging, color Doppler and duplex ultrasound were performed of bilateral carotid and vertebral arteries in the neck. COMPARISON:  None. FINDINGS: Criteria: Quantification of carotid stenosis is based on velocity parameters that correlate the residual internal carotid diameter with NASCET-based stenosis levels, using the diameter of the distal internal carotid lumen as the denominator for stenosis measurement. The following velocity measurements were obtained: RIGHT ICA: 73/21 cm/sec CCA: 0000000 cm/sec SYSTOLIC ICA/CCA RATIO:  1.0 ECA: 82 cm/sec LEFT ICA: 79/24 cm/sec CCA: 99991111 cm/sec SYSTOLIC ICA/CCA RATIO:  0.6 ECA: 97 cm/sec RIGHT CAROTID ARTERY: There is a minimal amount of eccentric echogenic plaque involving the mid aspect of the right common carotid artery (image 7). There is a minimal amount of eccentric echogenic plaque within the right carotid bulb (image 15). There is a minimal to moderate  amount of eccentric echogenic partially shadowing plaque involving the origin and proximal aspects of the right internal carotid artery (image 22), not resulting in elevated peak systolic velocities within the interrogated course of the right internal carotid artery to suggest a hemodynamically significant stenosis. RIGHT VERTEBRAL ARTERY:  Antegrade Flow LEFT CAROTID ARTERY: There is a minimal amount of eccentric echogenic plaque involving the mid aspect of the left internal carotid artery (image 40). There is a minimal amount of eccentric echogenic plaque within the left carotid bulb (image 48). There is a moderate amount of eccentric partially shadowing plaque involving the proximal and mid aspects of the left internal carotid artery (image 58), not resulting in elevated peak systolic velocities within the interrogated course of the left internal carotid artery to suggest a hemodynamically significant stenosis LEFT VERTEBRAL ARTERY:  Antegrade Flow IMPRESSION: Moderate amount of bilateral atherosclerotic plaque, not resulting in a hemodynamically significant stenosis within either internal carotid artery. Electronically Signed   By: Sandi Mariscal M.D.   On: 04/03/2019 15:19   Dg Chest Portable 1 View  Result Date: 04/29/2019 CLINICAL DATA:  Syncope, shortness of breath, cough EXAM: PORTABLE CHEST 1 VIEW COMPARISON:  04/20/2019 FINDINGS: Cardiomegaly, vascular congestion. Increasing left basilar atelectasis or infiltrate. No overt edema. No effusions or acute bony abnormality. IMPRESSION: Cardiomegaly, vascular congestion. Increasing left basilar atelectasis or infiltrate. Electronically Signed   By: Rolm Baptise M.D.   On: 04/29/2019 16:51   Dg Chest Port 1 View  Result Date: 04/02/2019 CLINICAL DATA:  Recurrent syncopal episodes. Cough. EXAM: PORTABLE CHEST 1 VIEW COMPARISON:  03/31/2019 FINDINGS: Stable cardiomegaly and pulmonary vascular congestion. Aortic atherosclerosis. Interval improvement in right  lower lobe pulmonary infiltrate since previous study. Left lung remains clear. No evidence of pleural effusion. Stent again seen in region of left axillary artery. IMPRESSION: Interval improvement in right lower lobe infiltrate since prior exam. Continued radiographic follow-up recommended to confirm resolution. Stable cardiomegaly and pulmonary venous hypertension. Electronically Signed   By: Marlaine Hind M.D.   On: 04/02/2019 13:26   Dg Shoulder Left  Result Date: 04/29/2019 CLINICAL DATA:  Left shoulder pain after syncope, fall. EXAM: LEFT SHOULDER - 2+ VIEW COMPARISON:  None. FINDINGS: There is no evidence of fracture or dislocation. Minimal glenohumeral osteoarthritis. There is no evidence of arthropathy or other focal bone abnormality. Soft tissues are unremarkable. Vascular stent in the left axilla. IMPRESSION: No fracture or  malalignment of the left shoulder. Electronically Signed   By: Keith Rake M.D.   On: 04/29/2019 19:18    Wt Readings from Last 3 Encounters:  05/01/19 66.2 kg  04/20/19 65 kg  04/04/19 65.6 kg    EKG: Initially atrial fibrillation with rapid ventricular response.  Currently in sinus rhythm.  Physical Exam:  Blood pressure (!) 117/52, pulse 72, temperature 98.5 F (36.9 C), temperature source Oral, resp. rate 19, height 5\' 6"  (1.676 m), weight 66.2 kg, SpO2 100 %. Body mass index is 23.55 kg/m. General: Well developed, well nourished, in no acute distress. Head: Normocephalic, atraumatic, sclera non-icteric, no xanthomas, nares are without discharge.  Neck: Negative for carotid bruits. JVD not elevated. Lungs: Clear bilaterally to auscultation without wheezes, rales, or rhonchi. Breathing is unlabored. Heart: RRR with S1 S2. No murmurs, rubs, or gallops appreciated. Abdomen: Soft, non-tender, non-distended with normoactive bowel sounds. No hepatomegaly. No rebound/guarding. No obvious abdominal masses. Msk:  Strength and tone appear normal for  age. Extremities: No clubbing or cyanosis. No edema.  Distal pedal pulses are 2+ and equal bilaterally. Neuro: Alert and oriented X 3. No facial asymmetry. No focal deficit. Moves all extremities spontaneously. Psych:  Responds to questions appropriately with a normal affect.     Assessment and Plan  58 year old male with end-stage renal disease on hemodialysis with history of newly diagnosed cardiomyopathy with concern over possible amyloid heart due to echogenicity.  He has LVH however does have increased voltage on his electrocardiogram which is consistent with LVH and not amyloid.  He was evaluated [facility] with a right heart cath and endomyocardial biopsy last week.  Biopsy thus far has been negative for sarcoid/amyloid.    He missed dialysis x2 last week similar to what happened on a previous admission.  In the emergency room he presented with complaints of recurrent syncopal episodes.  He was admitted for this.  Episodes of syncope appear to occur with coughing.  On close questioning, he has not stated that he had any syncope outside of the coughing episodes.  He developed atrial fibrillation with rapid ventricular response with relative hypotension during hemodialysis.  Upon completion dialysis he was given a bolus of amiodarone.  Currently is in sinus rhythm.  Will convert to p.o. amiodarone and follow.  Not an ideal candidate for chronic anticoagulation due to hemodialysis.  Will follow on this protocol for now.  He has ruled out for a myocardial infarction.  Elevated troponin due to renal insufficiency and demand.  Ivin Booty MD 05/01/2019, 11:06 AM Pager: [phone]

## 2019-05-01 NOTE — Plan of Care (Signed)

## 2019-05-01 NOTE — Progress Notes (Signed)
HD TX START. BFR at slower rate to prevent complications, will increase after 30 minutes. Target also set at 1500 and will increase accordingly to target of 30106ml.

## 2019-05-01 NOTE — Progress Notes (Signed)
[facility] [place] NAME: Jeremy [name]    MR#:  [id number]  DATE OF BIRTH:  08-29-1961  SUBJECTIVE:  CHIEF COMPLAINT:   Chief Complaint  Patient presents with  . Near Syncope   The patient complains of shortness of breath and cough.  On amiodarone drip.  Heart rate is controlled. REVIEW OF SYSTEMS:  Review of Systems  Constitutional: Positive for malaise/fatigue. Negative for chills and fever.  HENT: Negative for sore throat.   Eyes: Negative for blurred vision and double vision.  Respiratory: Positive for cough and shortness of breath. Negative for hemoptysis, sputum production, wheezing and stridor.   Cardiovascular: Negative for chest pain, palpitations, orthopnea and leg swelling.  Gastrointestinal: Negative for abdominal pain, blood in stool, diarrhea, melena, nausea and vomiting.  Genitourinary: Negative for dysuria, flank pain and hematuria.  Musculoskeletal: Positive for joint pain. Negative for back pain and neck pain.  Skin: Negative for rash.  Neurological: Negative for dizziness, sensory change, focal weakness, seizures, loss of consciousness, weakness and headaches.  Endo/Heme/Allergies: Negative for polydipsia.  Psychiatric/Behavioral: Negative for depression. The patient is not nervous/anxious.     DRUG ALLERGIES:  No Known Allergies VITALS:  Blood pressure (!) 117/52, pulse 72, temperature 98.5 F (36.9 C), temperature source Oral, resp. rate 19, height 5\' 6"  (1.676 m), weight 66.2 kg, SpO2 100 %. PHYSICAL EXAMINATION:  Physical Exam HENT:     Head: Normocephalic.     Mouth/Throat:     Mouth: Mucous membranes are moist.  Eyes:     General: No scleral icterus.    Conjunctiva/sclera: Conjunctivae normal.     Pupils: Pupils are equal, round, and reactive to light.  Neck:     Musculoskeletal: Normal range of motion and neck supple.     Vascular: No JVD.     Trachea: No tracheal deviation.     Comments: Positive  JVD. Cardiovascular:     Rate and Rhythm: Normal rate. Rhythm irregular.     Heart sounds: Normal heart sounds. No murmur. No gallop.   Pulmonary:     Effort: Pulmonary effort is normal. No respiratory distress.     Breath sounds: No stridor. Rales present. No wheezing or rhonchi.  Abdominal:     General: Bowel sounds are normal. There is no distension.     Palpations: Abdomen is soft.     Tenderness: There is no abdominal tenderness. There is no rebound.  Musculoskeletal: Normal range of motion.        General: No tenderness.     Right lower leg: No edema.     Left lower leg: No edema.  Skin:    Findings: No erythema or rash.  Neurological:     General: No focal deficit present.     Mental Status: He is alert and oriented to person, place, and time.     Cranial Nerves: No cranial nerve deficit.  Psychiatric:        Mood and Affect: Mood normal.    LABORATORY PANEL:  Male CBC Recent Labs  Lab 04/30/19 0559  WBC 10.5  HGB 10.0*  HCT 32.4*  PLT 401*   ------------------------------------------------------------------------------------------------------------------ Chemistries  Recent Labs  Lab 05/01/19 0710  NA 135  K 6.0*  CL 97*  CO2 19*  GLUCOSE 297*  BUN 66*  CREATININE 8.61*  CALCIUM 7.8*   RADIOLOGY:  No results found. ASSESSMENT AND PLAN:   Syncope- likely cough syncope, given that he passes out every time  he has a severe coughing episode.  Recently hospitalized at [facility] from 8/13-8/15 and had an unremarkable syncope work-up. Continue telemetry monitor.  A. fib with RVR.  Heart rate is controlled. Amiodarone 150 mg IV bolus and started amiodarone drip per Dr. Ubaldo Glassing. Will convert to p.o. amiodarone per Dr. Ubaldo Glassing.  Chronic cough- CHF or COPD. Follows with [facility] for his COPD. Recent RHC 03/30/19 showed very elevated filling pressures. -CXR with left basilar atelectasis vs infiltrate -Check procalcitonin (although may be falsely high in ESRD)  -Continue antitussives -Continue home inhalers -Duonebs prn.  Elevated troponin- likely demand ischemia.  Patient denies any active chest pain.  EKG with new T wave inversions in the inferior leads.  Troponins are stable.  Cardiology consult.  Left shoulder pain- occurred after a fall. -Left shoulder x-ray: No fracture. -Gabapentin, flexeril, and tylenol prn  ESRD on HD MWF -Continue Sensipar and Phoslo The patient needed hemodialysis today due to pulmonary edema and hyperkalemia per Dr. Juleen China.  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 Chest x-ray report cardiomegaly with pulmonary vascular redistribution and mild interstitial edema. -Continue Entresto.  Continue hemodialysis.  Hyperkalemia.  Potassium is still high at 6.0.  Lokelma and hemodialysis, follow-up potassium.  Anemia of chronic kidney disease- hemoglobin at baseline  Tobacco use Smoking cessation was counseled for 3 to 4 minutes, nicotine patch prn Fall.  The patient fell by accident this morning.  No injury reported.  Discussed with Dr. Juleen China and Dr. Ubaldo Glassing. All the records are reviewed and case discussed with Care Management/Social Worker. Management plans discussed with the patient, his sister and they are in agreement.  CODE STATUS: Full Code  TOTAL TIME TAKING CARE OF THIS PATIENT: 35 minutes.   More than 50% of the time was spent in counseling/coordination of care: YES  POSSIBLE D/C IN 3 DAYS, DEPENDING ON CLINICAL CONDITION.   Demetrios Loll M.D on 05/01/2019 at 12:50 PM  Between 7am to 6pm - Pager - [phone]  After 6pm go to [email or web address] - [facility]

## 2019-05-02 LAB — CBC
HCT: 29.5 % — ABNORMAL LOW (ref 39.0–52.0)
Hemoglobin: 9.1 g/dL — ABNORMAL LOW (ref 13.0–17.0)
MCH: 30.3 pg (ref 26.0–34.0)
MCHC: 30.8 g/dL (ref 30.0–36.0)
MCV: 98.3 fL (ref 80.0–100.0)
Platelets: 324 10*3/uL (ref 150–400)
RBC: 3 MIL/uL — ABNORMAL LOW (ref 4.22–5.81)
RDW: 17 % — ABNORMAL HIGH (ref 11.5–15.5)
WBC: 8.2 10*3/uL (ref 4.0–10.5)
nRBC: 0 % (ref 0.0–0.2)

## 2019-05-02 LAB — BASIC METABOLIC PANEL
Anion gap: 15 (ref 5–15)
BUN: 49 mg/dL — ABNORMAL HIGH (ref 6–20)
CO2: 24 mmol/L (ref 22–32)
Calcium: 7.8 mg/dL — ABNORMAL LOW (ref 8.9–10.3)
Chloride: 97 mmol/L — ABNORMAL LOW (ref 98–111)
Creatinine, Ser: 7.37 mg/dL — ABNORMAL HIGH (ref 0.61–1.24)
GFR calc Af Amer: 9 mL/min — ABNORMAL LOW (ref >60)
GFR calc non Af Amer: 7 mL/min — ABNORMAL LOW (ref >60)
Glucose, Bld: 282 mg/dL — ABNORMAL HIGH (ref 70–99)
Potassium: 4.7 mmol/L (ref 3.5–5.1)
Sodium: 136 mmol/L (ref 135–145)

## 2019-05-02 LAB — GLUCOSE, CAPILLARY
Glucose-Capillary: 235 mg/dL — ABNORMAL HIGH (ref 70–99)
Glucose-Capillary: 215 mg/dL — ABNORMAL HIGH (ref 70–99)

## 2019-05-02 LAB — HEMOGLOBIN A1C
Hgb A1c MFr Bld: 7.6 % — ABNORMAL HIGH (ref 4.8–5.6)
Mean Plasma Glucose: 171.42 mg/dL

## 2019-05-02 MED ORDER — EPOETIN ALFA 10000 UNIT/ML IJ SOLN
4000.0000 [IU] | INTRAMUSCULAR | Status: DC
  Administered 2019-05-02: 4000 [IU] via INTRAVENOUS

## 2019-05-02 MED ORDER — DILTIAZEM HCL 25 MG/5ML IV SOLN
10.0000 mg | Freq: Once | INTRAVENOUS | Status: AC
  Administered 2019-05-02: 10 mg via INTRAVENOUS
  Filled 2019-05-02: qty 5

## 2019-05-02 MED ORDER — INSULIN ASPART 100 UNIT/ML ~~LOC~~ SOLN
0.0000 [IU] | Freq: Every day | SUBCUTANEOUS | Status: DC
  Administered 2019-05-02: 22:00:00 2 [IU] via SUBCUTANEOUS
  Filled 2019-05-02: qty 1

## 2019-05-02 MED ORDER — INSULIN ASPART 100 UNIT/ML ~~LOC~~ SOLN
0.0000 [IU] | Freq: Three times a day (TID) | SUBCUTANEOUS | Status: DC
  Administered 2019-05-03: 5 [IU] via SUBCUTANEOUS
  Administered 2019-05-03 (×2): 2 [IU] via SUBCUTANEOUS
  Administered 2019-05-04: 19:00:00 5 [IU] via SUBCUTANEOUS
  Administered 2019-05-05: 08:00:00 3 [IU] via SUBCUTANEOUS
  Administered 2019-05-05: 2 [IU] via SUBCUTANEOUS
  Filled 2019-05-02 (×2): qty 1
  Filled 2019-05-02: qty 0.09
  Filled 2019-05-02 (×4): qty 1

## 2019-05-02 NOTE — Progress Notes (Signed)
Pt converted back into Afib with a heart rate in the 120s-130s. MD made aware and gave new orders.

## 2019-05-02 NOTE — Progress Notes (Signed)
Treatment completed 1.2L of fluid removed, no s/s of distress to note tolerated well   05/02/19 1415  Vital Signs  Pulse Rate (!) 40  Resp 19  BP 101/62  Oxygen Therapy  SpO2 97 %  During Hemodialysis Assessment  Blood Flow Rate (mL/min) 150 mL/min  Arterial Pressure (mmHg) -20 mmHg  Venous Pressure (mmHg) 90 mmHg  Transmembrane Pressure (mmHg) 50 mmHg  Ultrafiltration Rate (mL/min) 0 mL/min  Dialysate Flow Rate (mL/min) 600 ml/min  Conductivity: Machine  14.2  HD Safety Checks Performed Yes  Dialysis Fluid Bolus Normal Saline  Bolus Amount (mL) 250 mL  Intra-Hemodialysis Comments Tolerated well;Tx completed  Post-Hemodialysis Assessment  Rinseback Volume (mL) 250 mL  KECN 80.3 V  Dialyzer Clearance Clear  Duration of HD Treatment -hour(s) 3.5 hour(s)  Hemodialysis Intake (mL) 500 mL  UF Total -Machine (mL) 1700 mL  Net UF (mL) 1200 mL  Tolerated HD Treatment Yes  AVG/AVF Arterial Site Held (minutes) 10 minutes  AVG/AVF Venous Site Held (minutes) 10 minutes  Education / Care Plan  Dialysis Education Provided Yes  Documented Education in Care Plan Yes

## 2019-05-02 NOTE — Progress Notes (Signed)
Inpatient Diabetes Program Recommendations  AACE/ADA: New Consensus Statement on Inpatient Glycemic Control (2015)  Target Ranges:  Prepandial:   less than 140 mg/dL      Peak postprandial:   less than 180 mg/dL (1-2 hours)      Critically ill patients:  140 - 180 mg/dL   Results for Jeremy, [name] (MRN [id number]) as of 05/02/2019 08:46  Ref. Range 04/30/2019 05:59 04/30/2019 18:46 05/01/2019 07:10 05/01/2019 16:50 05/02/2019 05:58  Glucose Latest Ref Range: 70 - 99 mg/dL 249 (H) 270 (H) 297 (H) 198 (H) 282 (H)    Admit with: Syncope/ Cough/ A Fib with RVR  History: ESRD, COPD  No History of Diabetes noted in H&P      MD- Note that patient has had several elevated lab glucose levels since admission.  Do not see a previous History of Diabetes documented.  Please consider the following:  1. Place orders for Novolog Sensitive Correction Scale/ SSI (0-9 units) TID AC + HS   2. Check current A1c level (last A1c was 5.7% back on 11/20/2018)     --Will follow patient during hospitalization--  Wyn Quaker RN, MSN, CDE Diabetes Coordinator Inpatient Glycemic Control Team Team Pager: [phone] (8a-5p)

## 2019-05-02 NOTE — Progress Notes (Signed)
[place] [facility] [place] NAME: Jeremy [name]    MR#:  [id number]  DATE OF BIRTH:  Dec 24, 1960  SUBJECTIVE:  CHIEF COMPLAINT:   Chief Complaint  Patient presents with  . Near Syncope   The patient has better hortness of breath and cough.  Right eye is itchy and red.  On oxygen 2 L by nasal cannula. REVIEW OF SYSTEMS:  Review of Systems  Constitutional: Positive for malaise/fatigue. Negative for chills and fever.  HENT: Negative for sore throat.   Eyes: Positive for redness. Negative for blurred vision and double vision.  Respiratory: Positive for cough and shortness of breath. Negative for hemoptysis, sputum production, wheezing and stridor.   Cardiovascular: Negative for chest pain, palpitations, orthopnea and leg swelling.  Gastrointestinal: Negative for abdominal pain, blood in stool, diarrhea, melena, nausea and vomiting.  Genitourinary: Negative for dysuria, flank pain and hematuria.  Musculoskeletal: Positive for joint pain. Negative for back pain and neck pain.  Skin: Positive for itching. Negative for rash.  Neurological: Negative for dizziness, sensory change, focal weakness, seizures, loss of consciousness, weakness and headaches.  Endo/Heme/Allergies: Negative for polydipsia.  Psychiatric/Behavioral: Negative for depression. The patient is not nervous/anxious.     DRUG ALLERGIES:  No Known Allergies VITALS:  Blood pressure 97/60, pulse (!) 105, temperature (!) 97.5 F (36.4 C), temperature source Oral, resp. rate 13, height 5\' 6"  (1.676 m), weight 66.5 kg, SpO2 97 %. PHYSICAL EXAMINATION:  Physical Exam HENT:     Head: Normocephalic.     Mouth/Throat:     Mouth: Mucous membranes are moist.  Eyes:     General: No scleral icterus.    Conjunctiva/sclera: Conjunctivae normal.     Pupils: Pupils are equal, round, and reactive to light.  Neck:     Musculoskeletal: Normal range of motion and neck supple.     Vascular: No JVD.   Trachea: No tracheal deviation.     Comments: Positive JVD. Cardiovascular:     Rate and Rhythm: Normal rate. Rhythm irregular.     Heart sounds: Normal heart sounds. No murmur. No gallop.   Pulmonary:     Effort: Pulmonary effort is normal. No respiratory distress.     Breath sounds: No stridor. Rales present. No wheezing or rhonchi.  Abdominal:     General: Bowel sounds are normal. There is no distension.     Palpations: Abdomen is soft.     Tenderness: There is no abdominal tenderness. There is no rebound.  Musculoskeletal: Normal range of motion.        General: No tenderness.     Right lower leg: No edema.     Left lower leg: No edema.  Skin:    Findings: No erythema or rash.  Neurological:     General: No focal deficit present.     Mental Status: He is alert and oriented to person, place, and time.     Cranial Nerves: No cranial nerve deficit.  Psychiatric:        Mood and Affect: Mood normal.    LABORATORY PANEL:  Male CBC Recent Labs  Lab 05/02/19 0900  WBC 8.2  HGB 9.1*  HCT 29.5*  PLT 324   ------------------------------------------------------------------------------------------------------------------ Chemistries  Recent Labs  Lab 05/02/19 0558  NA 136  K 4.7  CL 97*  CO2 24  GLUCOSE 282*  BUN 49*  CREATININE 7.37*  CALCIUM 7.8*   RADIOLOGY:  No results found. ASSESSMENT AND PLAN:   Syncope-  likely cough syncope, given that he passes out every time he has a severe coughing episode.  Recently hospitalized at [facility] from 8/13-8/15 and had an unremarkable syncope work-up. Continue telemetry monitor.  A. fib with RVR.  Heart rate is controlled. Amiodarone 150 mg IV bolus and started amiodarone drip per Dr. Ubaldo Glassing. Converted to p.o. amiodarone per Dr. Ubaldo Glassing.  Chronic cough- CHF or COPD. Follows with [facility] for his COPD. Recent RHC 03/30/19 showed very elevated filling pressures. -CXR with left basilar atelectasis vs infiltrate -Check  procalcitonin (although may be falsely high in ESRD) -Continue antitussives -Continue home inhalers -Duonebs prn.  Elevated troponin- likely demand ischemia.  Patient denies any active chest pain.  EKG with new T wave inversions in the inferior leads.  Troponins are stable.  Left shoulder pain- occurred after a fall. -Left shoulder x-ray: No fracture. -Gabapentin, flexeril, and tylenol prn  ESRD on HD MWF -Continue Sensipar and Phoslo The patient needed hemodialysis today due to pulmonary edema and hyperkalemia per Dr. Juleen China.  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 Chest x-ray report cardiomegaly with pulmonary vascular redistribution and mild interstitial edema. Delene Loll is discontinued due to hyperkalemia.  Continue hemodialysis.  Hyperkalemia.  Potassium was 6.0.  Given Lokelma and hemodialysis, improved.  Anemia of chronic kidney disease- hemoglobin at baseline  Tobacco use Smoking cessation was counseled for 3 to 4 minutes, nicotine patch prn Fall.  Fall precaution.  Discussed with Dr. Candiss Norse. All the records are reviewed and case discussed with Care Management/Social Worker. Management plans discussed with the patient, his sister and they are in agreement.  CODE STATUS: Full Code  TOTAL TIME TAKING CARE OF THIS PATIENT: 33 minutes.   More than 50% of the time was spent in counseling/coordination of care: YES  POSSIBLE D/C IN 3 DAYS, DEPENDING ON CLINICAL CONDITION.   Demetrios Loll M.D on 05/02/2019 at 12:18 PM  Between 7am to 6pm - Pager - [phone]  After 6pm go to [email or web address] - [employer] [facility]

## 2019-05-02 NOTE — Progress Notes (Signed)
[facility]  ROUNDING NOTE   Subjective:   Patient seen during HD Requesting more fluid removal although BP is low A fib, irregular rhythm on tele monitor   HEMODIALYSIS FLOWSHEET:  Blood Flow Rate (mL/min): 400 mL/min Arterial Pressure (mmHg): -160 mmHg Venous Pressure (mmHg): 210 mmHg Transmembrane Pressure (mmHg): 50 mmHg Ultrafiltration Rate (mL/min): 340 mL/min Dialysate Flow Rate (mL/min): 600 ml/min Conductivity: Machine : 14.1 Conductivity: Machine : 14.1 Dialysis Fluid Bolus: Normal Saline Bolus Amount (mL): 250 mL     Objective:  Vital signs in last 24 hours:  Temp:  [97.5 F (36.4 C)-98.7 F (37.1 C)] 97.5 F (36.4 C) (08/24 1030) Pulse Rate:  [39-116] 48 (08/24 1345) Resp:  [10-21] 21 (08/24 1345) BP: (80-145)/(50-114) 111/88 (08/24 1345) SpO2:  [91 %-100 %] 97 % (08/24 1345) Weight:  [66.5 kg] 66.5 kg (08/24 0417)  Weight change: -0.488 kg Filed Weights   04/30/19 1617 05/01/19 0310 05/02/19 0417  Weight: 65.8 kg 66.2 kg 66.5 kg    Intake/Output: I/O last 3 completed shifts: In: 369.3 [I.V.:369.3] Out: 2000 [Other:2000]   Intake/Output this shift:  No intake/output data recorded.  Physical Exam: General: NAD,   Head: Normocephalic, atraumatic. Moist oral mucosal membranes  Eyes: Anicteric,rt eye covered with gauze   Neck: Supple,    Lungs:  Mild Basilar crackles  Heart: A Fib on tele monitor  Abdomen:  Soft, nontender,   Extremities:  no peripheral edema.  Neurologic: Nonfocal, moving all four extremities  Skin: No lesions  Access: Left AVF    Basic Metabolic Panel: Recent Labs  Lab 04/30/19 0559 04/30/19 1846 05/01/19 0710 05/01/19 1650 05/02/19 0558  NA 138 137 135 138 136  K 5.3* 5.7* 6.0* 3.5 4.7  CL 103 100 97* 97* 97*  CO2 23 21* 19* 27 24  GLUCOSE 249* 270* 297* 198* 282*  BUN 53* 58* 66* 34* 49*  CREATININE 7.31* 7.54* 8.61* 4.53* 7.37*  CALCIUM 8.6* 8.4* 7.8* 8.2* 7.8*    Liver Function Tests: No  results for input(s): AST, ALT, ALKPHOS, BILITOT, PROT, ALBUMIN in the last 168 hours. No results for input(s): LIPASE, AMYLASE in the last 168 hours. No results for input(s): AMMONIA in the last 168 hours.  CBC: Recent Labs  Lab 04/29/19 1522 04/30/19 0559 05/02/19 0900  WBC 7.6  7.6 10.5 8.2  NEUTROABS 6.1  --   --   HGB 9.4*  9.4* 10.0* 9.1*  HCT 29.5*  29.6* 32.4* 29.5*  MCV 95.5  95.2 97.0 98.3  PLT 375  342 401* 324    Cardiac Enzymes: No results for input(s): CKTOTAL, CKMB, CKMBINDEX, TROPONINI in the last 168 hours.  BNP: Invalid input(s): POCBNP  CBG: Recent Labs  Lab 04/30/19 2020  GLUCAP 235*    Microbiology: Results for orders placed or performed during the hospital encounter of 04/29/19  SARS Coronavirus 2 [facility] order, Performed in [facility] hospital lab) Nasopharyngeal Nasopharyngeal Swab     Status: None   Collection Time: 04/29/19  6:03 PM   Specimen: Nasopharyngeal Swab  Result Value Ref Range Status   SARS Coronavirus 2 NEGATIVE NEGATIVE Final    Comment: (NOTE) If result is NEGATIVE SARS-CoV-2 target nucleic acids are NOT DETECTED. The SARS-CoV-2 RNA is generally detectable in upper and lower  respiratory specimens during the acute phase of infection. The lowest  concentration of SARS-CoV-2 viral copies this assay can detect is 250  copies / mL. A negative result does not preclude SARS-CoV-2 infection  and should  not be used as the sole basis for treatment or other  patient management decisions.  A negative result may occur with  improper specimen collection / handling, submission of specimen other  than nasopharyngeal swab, presence of viral mutation(s) within the  areas targeted by this assay, and inadequate number of viral copies  (<250 copies / mL). A negative result must be combined with clinical  observations, patient history, and epidemiological information. If result is POSITIVE SARS-CoV-2 target nucleic acids are  DETECTED. The SARS-CoV-2 RNA is generally detectable in upper and lower  respiratory specimens dur ing the acute phase of infection.  Positive  results are indicative of active infection with SARS-CoV-2.  Clinical  correlation with patient history and other diagnostic information is  necessary to determine patient infection status.  Positive results do  not rule out bacterial infection or co-infection with other viruses. If result is PRESUMPTIVE POSTIVE SARS-CoV-2 nucleic acids MAY BE PRESENT.   A presumptive positive result was obtained on the submitted specimen  and confirmed on repeat testing.  While 2019 novel coronavirus  (SARS-CoV-2) nucleic acids may be present in the submitted sample  additional confirmatory testing may be necessary for epidemiological  and / or clinical management purposes  to differentiate between  SARS-CoV-2 and other Sarbecovirus currently known to infect humans.  If clinically indicated additional testing with an alternate test  methodology [phone]) is advised. The SARS-CoV-2 RNA is generally  detectable in upper and lower respiratory sp ecimens during the acute  phase of infection. The expected result is Negative. Fact Sheet for Patients:  StrictlyIdeas.no Fact Sheet for Healthcare Providers: BankingDealers.co.za This test is not yet approved or cleared by the [place] FDA and has been authorized for detection and/or diagnosis of SARS-CoV-2 by FDA under an Emergency Use Authorization (EUA).  This EUA will remain in effect (meaning this test can be used) for the duration of the COVID-19 declaration under Section 564(b)(1) of the Act, 21 U.S.C. section 360bbb-3(b)(1), unless the authorization is terminated or revoked sooner. Performed at [facility], [address] [place], [place] [address]     Coagulation Studies: No results for input(s): LABPROT, INR in the last 72  hours.  Urinalysis: No results for input(s): COLORURINE, LABSPEC, PHURINE, GLUCOSEU, HGBUR, BILIRUBINUR, KETONESUR, PROTEINUR, UROBILINOGEN, NITRITE, LEUKOCYTESUR in the last 72 hours.  Invalid input(s): APPERANCEUR    Imaging: No results found.   Medications:    . amiodarone  200 mg Oral BID  . benzonatate  200 mg Oral TID  . calcium acetate  667 mg Oral TID WC  . Chlorhexidine Gluconate Cloth  6 each Topical Daily  . cinacalcet  30 mg Oral 3 times weekly  . epoetin (EPOGEN/PROCRIT) injection  4,000 Units Intravenous Q M,W,F-HD  . fluticasone  2 spray Each Nare Daily  . heparin  5,000 Units Subcutaneous Q8H  . insulin aspart  0-5 Units Subcutaneous QHS  . insulin aspart  0-9 Units Subcutaneous TID WC  . pantoprazole  40 mg Oral Daily  . polyvinyl alcohol  1 drop Right Eye TID  . sevelamer carbonate  1,600 mg Oral TID WC  . umeclidinium-vilanterol  1 puff Inhalation Daily   acetaminophen **OR** acetaminophen, albuterol, cyclobenzaprine, gabapentin, guaiFENesin-codeine, ipratropium-albuterol, morphine injection, ondansetron **OR** ondansetron (ZOFRAN) IV, oxyCODONE, polyethylene glycol  Assessment/ Plan:  [name] is a 58 y.o. black male with end stage renal disease on hemodialysis, hypertension, congestive heart failure, diabetes mellitus type II, history of substance abuse, COPD with ongoing tobacco  use  [facility] MWF 65kg 180 minutes 2K bath  1. End stage renal disease with hyperkalemia: hemodialysis treatment on Friday and Saturday.   Seen during HD UF down adjusted as BP is low and patient is euvolemic.   2. Syncope: diagnosed as post-tussive syncope at [facility].   Hypotensive on this admission.  Atrial fibrillation with rapid ventricular response treated with amiodarone.   - cardiology team following  3. Anemia of chronic kidney disease: hemoglobin 10.  - EPO on MWF   4. Secondary Hyperparathyroidism:  Lab Results  Component Value Date    PTH 708 (H) 11/24/2018   CALCIUM 7.8 (L) 05/02/2019   PHOS 6.3 (H) 11/24/2018   - Calcium acetate with meals.  - Cinacalcet three times a week   LOS: 2 Nidhi Jacome 8/24/20202:15 PM

## 2019-05-02 NOTE — Progress Notes (Signed)
Joy, RN to take over care for this pt- report given

## 2019-05-02 NOTE — Progress Notes (Signed)
PT Cancellation Note  Patient Details Name: Jeremy [name] MRN: [id number] DOB: 05-27-1961   Cancelled Treatment:    Reason Eval/Treat Not Completed: Patient at procedure or test/unavailable(Attempted to see patient; transport here to take pt to HD. Will attempt again in afternoon.)   10:10 AM, 05/02/19 Etta Grandchild, PT, DPT Physical Therapist - [facility]  [phone] ([address])    [place] [facility] 05/02/2019, 10:10 AM

## 2019-05-02 NOTE — Progress Notes (Addendum)
Established patient known at [facility] MWF 7:00, patient drives self to treatments, however feels unsafe since passing out at stop sign. Per clinic patient only comes to treatments twice a week and usually only runs for about two hours, RX is three.  Please contact me directly with any dialysis placement concerns.  Elvera Bicker Dialysis Coordinator [phone]

## 2019-05-03 ENCOUNTER — Inpatient Hospital Stay: Payer: Medicare Other

## 2019-05-03 LAB — GLUCOSE, CAPILLARY
Glucose-Capillary: 297 mg/dL — ABNORMAL HIGH (ref 70–99)
Glucose-Capillary: 195 mg/dL — ABNORMAL HIGH (ref 70–99)
Glucose-Capillary: 168 mg/dL — ABNORMAL HIGH (ref 70–99)
Glucose-Capillary: 171 mg/dL — ABNORMAL HIGH (ref 70–99)
Glucose-Capillary: 118 mg/dL — ABNORMAL HIGH (ref 70–99)

## 2019-05-03 NOTE — TOC Initial Note (Signed)
Transition of Care [facility]) - Initial/Assessment Note    Patient Details  Name: Jeremy [name] MRN: [id number] Date of Birth: 12-21-1960  Transition of Care [facility]) CM/SW Contact:    Ross Ludwig, LCSW Phone Number: 05/03/2019, 5:56 PM  Clinical Narrative:                  Patient is a 58 year old male who lives in his own apartment.  CSW introduced self and explained the role of Education officer, museum.  CSW talked with patient, he stated that he feels he needs some extra help in the home or maybe needs to go to a SNF for a few days to get his strength up.  CSW explained the difference between home health and going to a SNF for short term rehab, patient was also explained process for looking for placement.  Patient feels he would benefit from SNF, CSW was given permission to begin bed search in [place].  Patient di did not have any other questions or concerns.    Expected Discharge Plan: Skilled Nursing Facility Barriers to Discharge: Continued Medical Work up   Patient Goals and CMS Choice   CMS Medicare.gov Compare Post Acute Care list provided to:: Patient Choice offered to / list presented to : Patient  Expected Discharge Plan and Services Expected Discharge Plan: [place] In-house Referral: Clinical Social Work   Post Acute Care Choice: [place] Living arrangements for the past 2 months: Apartment Expected Discharge Date: 05/01/19               DME Arranged: N/A DME Agency: NA     Representative spoke with at DME Agency: na            Prior Living Arrangements/Services Living arrangements for the past 2 months: Apartment Lives with:: Self Patient language and need for interpreter reviewed:: Yes Do you feel safe going back to the place where you live?: No   Patient feels he is too weak to return back home and would like to go to SNF for short term rehab, then return back home.  Need for Family Participation in Patient Care: No (Comment) Care  giver support system in place?: No (comment)   Criminal Activity/Legal Involvement Pertinent to Current Situation/Hospitalization: No - Comment as needed  Activities of Daily Living Home Assistive Devices/Equipment: None ADL Screening (condition at time of admission) Patient's cognitive ability adequate to safely complete daily activities?: Yes Is the patient deaf or have difficulty hearing?: No Does the patient have difficulty seeing, even when wearing glasses/contacts?: No Does the patient have difficulty concentrating, remembering, or making decisions?: No Patient able to express need for assistance with ADLs?: Yes Does the patient have difficulty dressing or bathing?: No Independently performs ADLs?: Yes (appropriate for developmental age) Does the patient have difficulty walking or climbing stairs?: Yes Weakness of Legs: Both Weakness of Arms/Hands: None  Permission Sought/Granted Permission sought to share information with : Family Supports, Case Freight forwarder, Customer service manager Permission granted to share information with : Yes, Verbal Permission Granted  Share Information with NAME: Luiz Iron   [phone] or Reddick,Renee Sister   [phone]  Permission granted to share info w AGENCY: SNF admissions        Emotional Assessment Appearance:: Appears older than stated age Attitude/Demeanor/Rapport: Engaged Affect (typically observed): Accepting, Adaptable, Anxious, Appropriate, Calm, Hopeful, Stable Orientation: : Oriented to Self, Oriented to Place, Oriented to Situation, Oriented to  Time Alcohol / Substance Use: Not Applicable Psych  Involvement: No (comment)  Admission diagnosis:  Syncope and collapse [R55] Abnormal EKG [R94.31] Elevated troponin [R79.89] Patient Active Problem List   Diagnosis Date Noted  . Atrial fibrillation with rapid ventricular response ([place]) 04/30/2019  . Syncope 04/29/2019  . Acute CHF (congestive heart failure) ([place])  02/14/2019  . Acute encephalopathy 11/23/2018  . Influenza B 11/21/2018  . Pulmonary edema 09/22/2018  . Hyperkalemia 05/16/2018   PCP:  Patient, No Pcp Per Pharmacy:   [facility] [id number], [place] [address] Phone: [phone] Fax: [phone]     Social Determinants of Health (SDOH) Interventions    Readmission Risk Interventions Readmission Risk Prevention Plan 05/01/2019 04/04/2019 04/03/2019  Transportation Screening Complete - Complete  Medication Review (RN Care Manager) Complete - Complete  PCP or Specialist appointment within 3-5 days of discharge - Complete -  [place] or [place] - - -  SW Recovery Care/Counseling Consult - Complete -  Palliative Care Screening Not Applicable - Not [place] - - Not Applicable  Some recent data might be hidden

## 2019-05-03 NOTE — Care Management Important Message (Signed)
Important Message  Patient Details  Name: Jeremy [name] MRN: [id number] Date of Birth: December 30, 1960   Medicare Important Message Given:  Yes  Initial Medicare IM given by Patient Access Associate on 05/02/2019 at 10:46am.  Still valid.   Dannette Barbara 05/03/2019, 8:26 AM

## 2019-05-03 NOTE — Progress Notes (Signed)
Inpatient Diabetes Program Recommendations  AACE/ADA: New Consensus Statement on Inpatient Glycemic Control   Target Ranges:  Prepandial:   less than 140 mg/dL      Peak postprandial:   less than 180 mg/dL (1-2 hours)      Critically ill patients:  140 - 180 mg/dL  Results for AARJAV, SCHECHTER (MRN [id number]) as of 05/03/2019 11:30  Ref. Range 05/02/2019 21:23 05/03/2019 07:38  Glucose-Capillary Latest Ref Range: 70 - 99 mg/dL 215 (H) 297 (H)   Results for SARVESH, BURDGE (MRN [id number]) as of 05/03/2019 11:30  Ref. Range 05/02/2019 09:00  Hemoglobin A1C Latest Ref Range: 4.8 - 5.6 % 7.6 (H)    Review of Glycemic Control  Diabetes history: No Outpatient Diabetes medications: NA Current orders for Inpatient glycemic control: Novolog 0-9 units TID with meals, Novolog 0-5 units QHS  Inpatient Diabetes Program Recommendations:   A1C: A1C 7.6% on 05/02/19 indicating an average glucose of 171 mg/dl over the past 2-3 months. Hemoglobin noted to be 9.1 g/dL on 05/02/19 so A1C results not completely accurate. Recent steroid use likely contributing to elevated A1C. However, glucose since admission has ranged from 198-297 mg/dl.  MD, will patient be newly dx with DM? If so, please inform patient and nursing staff so patient can be educated on DM while inpatient.  NOTE: In reviewing chart, noted no DM history noted. In Care Everywhere, noted A1C of 5.5% on 12/16/2017. Also noted that patient was recently inpatient at [facility] 04/21/19 to 04/23/19 and patient was discharged on Prednisone 40 mg BID for 4 days.  Thanks, Barnie Alderman, RN, MSN, CDE Diabetes Coordinator Inpatient Diabetes Program [phone] (Team Pager from 8am to 5pm)

## 2019-05-03 NOTE — Evaluation (Signed)
Physical Therapy Evaluation Patient Details Name: Jeremy [name] MRN: [id number] DOB: Sep 08, 1961 Today's Date: 05/03/2019   History of Present Illness  58 y.o. male with a known history of ESRD on HD MWF and hypertension who presented to the ED with, subjectively, cough induced and syncope.  Patient states his cough is gotten worse in the last few days and that when his cough gets bad enough, he passes out.  He reports multiple bouts of this in the days leading to hospitalization.  All of these episodes of syncope have occurred in the setting of a severe coughing episode.  His cough is productive of clear sputum. Of note, patient was recently hospitalized at [facility] from [phone] with syncope.  Work-up was completely unremarkable, and he was felt to have posttussive syncope.  Clinical Impression  Pt did reasonably well with mobility and showed functional strength, however he displayed consistent unsteadiness during ambulation with stagger steps and a LOB that he was able to self arrest.  He also negotiated steps, but fatigued very quickly with just 6 of them and could not attempt the complete the flight.  Pts O2 in the 80s on arrival (on room air, unable to get sats >90 and required O2 during activity), his sats did stay in the 90s on 2L during activity but he had DOE and was clearly fatigued with relatively modest exertion.  Pt does not have a lot of assist available, PT is recommending HHPT and encouraged pt to have some form of check-in and assist with errands when he first returns home.   Follow Up Recommendations Home health PT;Supervision - Intermittent(encouraged pt to have someone check in or at least call QD)    Equipment Recommendations  (discussed benefit of cane)    Recommendations for Other Services       Precautions / Restrictions Precautions Precautions: Fall(droplet isolation) Restrictions Weight Bearing Restrictions: No      Mobility  Bed Mobility Overal bed mobility: Independent              General bed mobility comments: Pt easily gets himself to EOB w/o assist  Transfers Overall transfer level: Modified independent Equipment used: None             General transfer comment: Pt able to rise to standing, showed good initial confidence, though did have some standing unsteadiness with minimal functional mobility  Ambulation/Gait Ambulation/Gait assistance: Supervision;Min guard Gait Distance (Feet): 100 Feet Assistive device: None       General Gait Details: Pt walked with good confidence, but in reality he had relatively frequent stagger steps (along with a small LOB that he was able to self arrest).  Pt initially refuses the idea that he was somewhat unsteady until his actual LOB.  Once back to the room pt very quickly needed to sit and was very fatigued.  Stairs Stairs: Yes Stairs assistance: Supervision Stair Management: Two rails;Alternating pattern Number of Stairs: 6 General stair comments: Pt was able to negotiate up/down steps without direct assist, however was very fatigued with just 1/2 flight  Wheelchair Mobility    Modified Rankin (Stroke Patients Only)       Balance Overall balance assessment: Needs assistance   Sitting balance-Leahy Scale: Good Sitting balance - Comments: Pt able to maintain sitting balance, however was generally leaning over/stooped and looking uncomfortable   Standing balance support: No upper extremity supported Standing balance-Leahy Scale: Fair Standing balance comment: Pt able to rise and maintain balance statically, but had consistent stagger stepping and low  grade unsteadiness t/o most dynamic/walking acts                             Pertinent Vitals/Pain Pain Assessment: (unrated pain in R head and R shoulder)    Home Living Family/patient expects to be discharged to:: Private residence Living Arrangements: Alone Available Help at Discharge: (reports he has very little in way of  support) Type of Home: Apartment Home Access: Stairs to enter Entrance Stairs-Rails: Can reach both Entrance Stairs-Number of Steps: flight Home Layout: One level Home Equipment: None      Prior Function Level of Independence: Independent         Comments: Pt works as Freight forwarder, independent     Journalist, newspaper        Extremity/Trunk Assessment   Upper Extremity Assessment Upper Extremity Assessment: Overall WFL for tasks assessed(som minimal pain with L shoulder overhead motion)    Lower Extremity Assessment Lower Extremity Assessment: Overall WFL for tasks assessed       Communication   Communication: No difficulties  Cognition Arousal/Alertness: Awake/alert Behavior During Therapy: WFL for tasks assessed/performed Overall Cognitive Status: Within Functional Limits for tasks assessed                                        General Comments General comments (skin integrity, edema, etc.): Pt on room air on arrival, sats in the mid/low 80s, increased with focused breathing, but did not breach 90%, sats up to mid 90s on 2 liters, dropping to low 90s with activity, placed on 1 liter post ambulation with sats remaining in the mid 90s at rest    Exercises     Assessment/Plan    PT Assessment Patient needs continued PT services  PT Problem List Decreased strength;Decreased balance;Decreased activity tolerance;Decreased safety awareness;Cardiopulmonary status limiting activity       PT Treatment Interventions DME instruction;Gait training;Functional mobility training;Stair training;Therapeutic activities;Therapeutic exercise;Balance training;Cognitive remediation    PT Goals (Current goals can be found in the Care Plan section)  Acute Rehab PT Goals Patient Stated Goal: Figure out why he is passing out PT Goal Formulation: With patient Time For Goal Achievement: 05/17/19 Potential to Achieve Goals: Good    Frequency Min 2X/week    Barriers to discharge        Co-evaluation               AM-PAC PT "6 Clicks" Mobility  Outcome Measure Help needed turning from your back to your side while in a flat bed without using bedrails?: None Help needed moving from lying on your back to sitting on the side of a flat bed without using bedrails?: None Help needed moving to and from a bed to a chair (including a wheelchair)?: None Help needed standing up from a chair using your arms (e.g., wheelchair or bedside chair)?: None Help needed to walk in hospital room?: A Little Help needed climbing 3-5 steps with a railing? : A Little 6 Click Score: 22    End of Session Equipment Utilized During Treatment: Gait belt Activity Tolerance: Patient limited by fatigue Patient left: with chair alarm set;with call bell/phone within reach Nurse Communication: Mobility status(O2 response to care) PT Visit Diagnosis: Difficulty in walking, not elsewhere classified (R26.2);Repeated falls (R29.6)    Time: [id number] PT Time Calculation (min) (ACUTE ONLY): 26 min  Charges:   PT Evaluation $PT Eval Low Complexity: 1 Low PT Treatments $Gait Training: 8-22 mins        Kreg Shropshire, DPT 05/03/2019, 3:30 PM

## 2019-05-03 NOTE — Progress Notes (Signed)
[facility]  ROUNDING NOTE   Subjective:   sinus rhythm at present Continues to c/o cough with wheezing No edema  Objective:  Vital signs in last 24 hours:  Temp:  [97.5 F (36.4 C)-98.8 F (37.1 C)] 98.3 F (36.8 C) (08/25 0900) Pulse Rate:  [39-123] 83 (08/25 0900) Resp:  [10-21] 18 (08/25 0900) BP: (95-174)/(52-160) 124/62 (08/25 0900) SpO2:  [89 %-100 %] 93 % (08/25 0900)  Weight change:  Filed Weights   04/30/19 1617 05/01/19 0310 05/02/19 0417  Weight: 65.8 kg 66.2 kg 66.5 kg    Intake/Output: I/O last 3 completed shifts: In: -  Out: 1200 [Other:1200]   Intake/Output this shift:  No intake/output data recorded.  Physical Exam: General: NAD,   Head: Normocephalic, atraumatic. Moist oral mucosal membranes  Eyes: Anicteric   Neck: Supple,    Lungs:  Clear today  Heart: A Fib on tele monitor  Abdomen:  Soft, nontender,   Extremities:  no peripheral edema.  Neurologic: Nonfocal, moving all four extremities  Skin: No lesions  Access: Left AVF    Basic Metabolic Panel: Recent Labs  Lab 04/30/19 0559 04/30/19 1846 05/01/19 0710 05/01/19 1650 05/02/19 0558  NA 138 137 135 138 136  K 5.3* 5.7* 6.0* 3.5 4.7  CL 103 100 97* 97* 97*  CO2 23 21* 19* 27 24  GLUCOSE 249* 270* 297* 198* 282*  BUN 53* 58* 66* 34* 49*  CREATININE 7.31* 7.54* 8.61* 4.53* 7.37*  CALCIUM 8.6* 8.4* 7.8* 8.2* 7.8*    Liver Function Tests: No results for input(s): AST, ALT, ALKPHOS, BILITOT, PROT, ALBUMIN in the last 168 hours. No results for input(s): LIPASE, AMYLASE in the last 168 hours. No results for input(s): AMMONIA in the last 168 hours.  CBC: Recent Labs  Lab 04/29/19 1522 04/30/19 0559 05/02/19 0900  WBC 7.6  7.6 10.5 8.2  NEUTROABS 6.1  --   --   HGB 9.4*  9.4* 10.0* 9.1*  HCT 29.5*  29.6* 32.4* 29.5*  MCV 95.5  95.2 97.0 98.3  PLT 375  342 401* 324    Cardiac Enzymes: No results for input(s): CKTOTAL, CKMB, CKMBINDEX, TROPONINI in the  last 168 hours.  BNP: Invalid input(s): POCBNP  CBG: Recent Labs  Lab 04/30/19 2020 05/02/19 2123 05/03/19 0738  GLUCAP 235* 215* 297*    Microbiology: Results for orders placed or performed during the hospital encounter of 04/29/19  SARS Coronavirus 2 [facility] order, Performed in [facility] hospital lab) Nasopharyngeal Nasopharyngeal Swab     Status: None   Collection Time: 04/29/19  6:03 PM   Specimen: Nasopharyngeal Swab  Result Value Ref Range Status   SARS Coronavirus 2 NEGATIVE NEGATIVE Final    Comment: (NOTE) If result is NEGATIVE SARS-CoV-2 target nucleic acids are NOT DETECTED. The SARS-CoV-2 RNA is generally detectable in upper and lower  respiratory specimens during the acute phase of infection. The lowest  concentration of SARS-CoV-2 viral copies this assay can detect is 250  copies / mL. A negative result does not preclude SARS-CoV-2 infection  and should not be used as the sole basis for treatment or other  patient management decisions.  A negative result may occur with  improper specimen collection / handling, submission of specimen other  than nasopharyngeal swab, presence of viral mutation(s) within the  areas targeted by this assay, and inadequate number of viral copies  (<250 copies / mL). A negative result must be combined with clinical  observations, patient history, and epidemiological  information. If result is POSITIVE SARS-CoV-2 target nucleic acids are DETECTED. The SARS-CoV-2 RNA is generally detectable in upper and lower  respiratory specimens dur ing the acute phase of infection.  Positive  results are indicative of active infection with SARS-CoV-2.  Clinical  correlation with patient history and other diagnostic information is  necessary to determine patient infection status.  Positive results do  not rule out bacterial infection or co-infection with other viruses. If result is PRESUMPTIVE POSTIVE SARS-CoV-2 nucleic acids MAY BE PRESENT.    A presumptive positive result was obtained on the submitted specimen  and confirmed on repeat testing.  While 2019 novel coronavirus  (SARS-CoV-2) nucleic acids may be present in the submitted sample  additional confirmatory testing may be necessary for epidemiological  and / or clinical management purposes  to differentiate between  SARS-CoV-2 and other Sarbecovirus currently known to infect humans.  If clinically indicated additional testing with an alternate test  methodology [phone]) is advised. The SARS-CoV-2 RNA is generally  detectable in upper and lower respiratory sp ecimens during the acute  phase of infection. The expected result is Negative. Fact Sheet for Patients:  StrictlyIdeas.no Fact Sheet for Healthcare Providers: BankingDealers.co.za This test is not yet approved or cleared by the [place] FDA and has been authorized for detection and/or diagnosis of SARS-CoV-2 by FDA under an Emergency Use Authorization (EUA).  This EUA will remain in effect (meaning this test can be used) for the duration of the COVID-19 declaration under Section 564(b)(1) of the Act, 21 U.S.C. section 360bbb-3(b)(1), unless the authorization is terminated or revoked sooner. Performed at [facility], [address] [place], [place] [address]     Coagulation Studies: No results for input(s): LABPROT, INR in the last 72 hours.  Urinalysis: No results for input(s): COLORURINE, LABSPEC, PHURINE, GLUCOSEU, HGBUR, BILIRUBINUR, KETONESUR, PROTEINUR, UROBILINOGEN, NITRITE, LEUKOCYTESUR in the last 72 hours.  Invalid input(s): APPERANCEUR    Imaging: No results found.   Medications:    . amiodarone  200 mg Oral BID  . benzonatate  200 mg Oral TID  . calcium acetate  667 mg Oral TID WC  . Chlorhexidine Gluconate Cloth  6 each Topical Daily  . cinacalcet  30 mg Oral 3 times weekly  . fluticasone  2 spray Each Nare Daily  .  heparin  5,000 Units Subcutaneous Q8H  . insulin aspart  0-5 Units Subcutaneous QHS  . insulin aspart  0-9 Units Subcutaneous TID WC  . pantoprazole  40 mg Oral Daily  . polyvinyl alcohol  1 drop Right Eye TID  . sevelamer carbonate  1,600 mg Oral TID WC  . umeclidinium-vilanterol  1 puff Inhalation Daily   acetaminophen **OR** acetaminophen, albuterol, cyclobenzaprine, gabapentin, guaiFENesin-codeine, ipratropium-albuterol, morphine injection, ondansetron **OR** ondansetron (ZOFRAN) IV, oxyCODONE, polyethylene glycol  Assessment/ Plan:  Mr. Davit Bretl is a 58 y.o. black male with end stage renal disease on hemodialysis, hypertension, congestive heart failure, diabetes mellitus type II, history of substance abuse, COPD with ongoing tobacco use  [facility] MWF 65kg 180 minutes 2K bath  1. End stage renal disease with hyperkalemia: Scheduled HD tomorrow.   2. Syncope: diagnosed as post-tussive syncope at [facility].   Hypotensive on this admission.  Atrial fibrillation with rapid ventricular response treated with amiodarone.   - cardiology team following  3. Anemia of chronic kidney disease:  -  Lab Results  Component Value Date   HGB 9.1 (L) 05/02/2019   - EPO on MWF  4. Secondary Hyperparathyroidism:  Lab Results  Component Value Date   PTH 708 (H) 11/24/2018   CALCIUM 7.8 (L) 05/02/2019   PHOS 6.3 (H) 11/24/2018   - Calcium acetate with meals.  - Cinacalcet three times a week   LOS: 3 Breigh Annett 8/25/20209:42 AM

## 2019-05-03 NOTE — Progress Notes (Signed)
Pt found to have O2 sats at rest on RA in the mid 80s. Pt O2 sats at rest with 1L [place] are 97%.

## 2019-05-03 NOTE — Progress Notes (Signed)
[facility] [place] NAME: Jeremy [name]    MR#:  [id number]  DATE OF BIRTH:  1961-07-13  SUBJECTIVE:  CHIEF COMPLAINT:   Chief Complaint  Patient presents with  . Near Syncope   The patient has better hortness of breath and cough.  On oxygen 2 L by nasal cannula this morning.  No syncope episode for the past 2 days. REVIEW OF SYSTEMS:  Review of Systems  Constitutional: Positive for malaise/fatigue. Negative for chills and fever.  HENT: Negative for sore throat.   Eyes: Positive for redness. Negative for blurred vision and double vision.  Respiratory: Positive for cough and shortness of breath. Negative for hemoptysis, sputum production, wheezing and stridor.   Cardiovascular: Negative for chest pain, palpitations, orthopnea and leg swelling.  Gastrointestinal: Negative for abdominal pain, blood in stool, diarrhea, melena, nausea and vomiting.  Genitourinary: Negative for dysuria, flank pain and hematuria.  Musculoskeletal: Positive for joint pain. Negative for back pain and neck pain.  Skin: Positive for itching. Negative for rash.  Neurological: Negative for dizziness, sensory change, focal weakness, seizures, loss of consciousness, weakness and headaches.  Endo/Heme/Allergies: Negative for polydipsia.  Psychiatric/Behavioral: Negative for depression. The patient is not nervous/anxious.     DRUG ALLERGIES:  No Known Allergies VITALS:  Blood pressure 124/62, pulse 83, temperature 98.3 F (36.8 C), temperature source Oral, resp. rate 18, height 5\' 6"  (1.676 m), weight 66.5 kg, SpO2 93 %. PHYSICAL EXAMINATION:  Physical Exam HENT:     Head: Normocephalic.     Mouth/Throat:     Mouth: Mucous membranes are moist.  Eyes:     General: No scleral icterus.    Conjunctiva/sclera: Conjunctivae normal.     Pupils: Pupils are equal, round, and reactive to light.  Neck:     Musculoskeletal: Normal range of motion and neck supple.   Vascular: No JVD.     Trachea: No tracheal deviation.     Comments: Positive JVD. Cardiovascular:     Rate and Rhythm: Normal rate. Rhythm irregular.     Heart sounds: Normal heart sounds. No murmur. No gallop.   Pulmonary:     Effort: Pulmonary effort is normal. No respiratory distress.     Breath sounds: No stridor. Rales present. No wheezing or rhonchi.  Abdominal:     General: Bowel sounds are normal. There is no distension.     Palpations: Abdomen is soft.     Tenderness: There is no abdominal tenderness. There is no rebound.  Musculoskeletal: Normal range of motion.        General: No tenderness.     Right lower leg: No edema.     Left lower leg: No edema.  Skin:    Findings: No erythema or rash.  Neurological:     General: No focal deficit present.     Mental Status: He is alert and oriented to person, place, and time.     Cranial Nerves: No cranial nerve deficit.  Psychiatric:        Mood and Affect: Mood normal.    LABORATORY PANEL:  Male CBC Recent Labs  Lab 05/02/19 0900  WBC 8.2  HGB 9.1*  HCT 29.5*  PLT 324   ------------------------------------------------------------------------------------------------------------------ Chemistries  Recent Labs  Lab 05/02/19 0558  NA 136  K 4.7  CL 97*  CO2 24  GLUCOSE 282*  BUN 49*  CREATININE 7.37*  CALCIUM 7.8*   RADIOLOGY:  No results found. ASSESSMENT AND PLAN:  Syncope- likely cough syncope, given that he passes out every time he has a severe coughing episode.  Recently hospitalized at [facility] from 8/13-8/15 and had an unremarkable syncope work-up. Continue telemetry monitor.  A. fib with RVR.  Heart rate is controlled. Amiodarone 150 mg IV bolus and started amiodarone drip per Dr. Ubaldo Glassing. Converted to p.o. amiodarone per Dr. Ubaldo Glassing.  Chronic cough- CHF or COPD. Follows with [facility] for his COPD. Recent RHC 03/30/19 showed very elevated filling pressures. -CXR with left basilar atelectasis vs  infiltrate -Continue antitussives -Continue home inhalers -Duonebs prn.  Elevated troponin- likely demand ischemia.  Patient denies any active chest pain.  EKG with new T wave inversions in the inferior leads.  Troponins are stable.  Left shoulder pain- occurred after a fall. -Left shoulder x-ray: No fracture. -Gabapentin, flexeril, and tylenol prn  ESRD on HD MWF -Continue Sensipar and Phoslo, continue with hemodialysis as scheduled per Dr. Candiss Norse.   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 Chest x-ray report cardiomegaly with pulmonary vascular redistribution and mild interstitial edema. Delene Loll was discontinued due to hyperkalemia.  Continue hemodialysis.  Hyperkalemia.  Potassium was 6.0.  Given Lokelma and hemodialysis, improved.  Anemia of chronic kidney disease- hemoglobin at baseline  Tobacco use Smoking cessation was counseled for 3 to 4 minutes, nicotine patch prn Fall.  Fall precaution.  New diagnosis of diabetes type 2.  Hemoglobin A1c 9.1. On sliding scale.  Discussed with Dr. Candiss Norse. All the records are reviewed and case discussed with Care Management/Social Worker. Management plans discussed with the patient, his sister and they are in agreement.  CODE STATUS: Full Code  TOTAL TIME TAKING CARE OF THIS PATIENT: 28 minutes.   More than 50% of the time was spent in counseling/coordination of care: YES  POSSIBLE D/C IN 2 DAYS, DEPENDING ON CLINICAL CONDITION.   Demetrios Loll M.D on 05/03/2019 at 2:53 PM  Between 7am to 6pm - Pager - [phone]  After 6pm go to [email or web address] - [facility]

## 2019-05-03 NOTE — NC FL2 (Signed)
[place] LEVEL OF CARE SCREENING TOOL     IDENTIFICATION  Patient Name: Jeremy [name] Birthdate: [birth year] Sex: male Admission Date (Current Location): 04/29/2019  [place] and [place] Number:  Jeremy [name] [id number] [place] and Address:  [facility], [address] [place], [place] [address]      Provider Number: [id number]  Attending Physician Name and Address:  Demetrios Loll, MD  Relative Name and Phone Number:  Luiz Iron   [phone] or Walby,Renee Sister   [phone]    Current Level of Care: Hospital Recommended Level of Care: [place] Prior Approval Number:    Date Approved/Denied:   PASRR Number: [id number]  Discharge Plan: SNF    Current Diagnoses: Patient Active Problem List   Diagnosis Date Noted  . Atrial fibrillation with rapid ventricular response ([place]) 04/30/2019  . Syncope 04/29/2019  . Acute CHF (congestive heart failure) ([place]) 02/14/2019  . Acute encephalopathy 11/23/2018  . Influenza B 11/21/2018  . Pulmonary edema 09/22/2018  . Hyperkalemia 05/16/2018    Orientation RESPIRATION BLADDER Height & Weight     Time, Situation, Place, Self  O2(1L) Continent Weight: 146 lb 10.1 oz (66.5 kg) Height:  5\' 6"  (167.6 cm)  BEHAVIORAL SYMPTOMS/MOOD NEUROLOGICAL BOWEL NUTRITION STATUS      Continent Diet(Renal diet, fluid restrictions)  AMBULATORY STATUS COMMUNICATION OF NEEDS Skin   Limited Assist Verbally Normal                       Personal Care Assistance Level of Assistance  Bathing, Feeding, Dressing Bathing Assistance: Limited assistance Feeding assistance: Independent Dressing Assistance: Limited assistance     Functional Limitations Info  Sight, Hearing, Speech Sight Info: Adequate Hearing Info: Adequate Speech Info: Adequate    SPECIAL CARE FACTORS FREQUENCY  PT (By licensed PT), OT (By licensed OT)     PT Frequency: Minimum 5x a week OT Frequency: Minimum 5x a  week            Contractures Contractures Info: Not present    Additional Factors Info  Code Status, Allergies, Insulin Sliding Scale, Isolation Precautions Code Status Info: Full Code Allergies Info: No Known Allergies   Insulin Sliding Scale Info: insulin aspart (novoLOG) injection 0-9 Units 3x a day with meals       Current Medications (05/03/2019):  This is the current hospital active medication list Current Facility-Administered Medications  Medication Dose Route Frequency Provider Last Rate Last Dose  . acetaminophen (TYLENOL) tablet 650 mg  650 mg Oral Q6H PRN Mayo, Pete Pelt, MD       Or  . acetaminophen (TYLENOL) suppository 650 mg  650 mg Rectal Q6H PRN Mayo, Pete Pelt, MD      . albuterol (PROVENTIL) (2.5 MG/3ML) 0.083% nebulizer solution 2.5 mg  2.5 mg Inhalation Q6H PRN Mayo, Pete Pelt, MD      . amiodarone (PACERONE) tablet 200 mg  200 mg Oral BID Teodoro Spray, MD   200 mg at 05/03/19 [id number]  . benzonatate (TESSALON) capsule 200 mg  200 mg Oral TID Sela Hua, MD   200 mg at 05/03/19 1656  . calcium acetate (PHOSLO) capsule 667 mg  667 mg Oral TID WC Mayo, Pete Pelt, MD   667 mg at 05/03/19 1656  . Chlorhexidine Gluconate Cloth 2 % PADS 6 each  6 each Topical Daily Demetrios Loll, MD   6 each at 05/02/19 1000  . cinacalcet (SENSIPAR) tablet 30 mg  30 mg Oral  3 times weekly Mayo, Pete Pelt, MD   30 mg at 05/02/19 [id number]  . cyclobenzaprine (FLEXERIL) tablet 5 mg  5 mg Oral TID PRN Mayo, Pete Pelt, MD   5 mg at 05/02/19 0908  . fluticasone (FLONASE) 50 MCG/ACT nasal spray 2 spray  2 spray Each Nare Daily Mayo, Pete Pelt, MD   2 spray at 05/03/19 [id number]  . gabapentin (NEURONTIN) capsule 100 mg  100 mg Oral Daily PRN Mayo, Pete Pelt, MD   100 mg at 05/01/19 1849  . guaiFENesin-codeine 100-10 MG/5ML solution 10 mL  10 mL Oral Q4H PRN Mayo, Pete Pelt, MD   10 mL at 05/03/19 1308  . heparin injection 5,000 Units  5,000 Units Subcutaneous Q8H Mayo, Pete Pelt, MD   5,000 Units at  05/02/19 1559  . insulin aspart (novoLOG) injection 0-5 Units  0-5 Units Subcutaneous QHS Demetrios Loll, MD   2 Units at 05/02/19 2140  . insulin aspart (novoLOG) injection 0-9 Units  0-9 Units Subcutaneous TID WC Demetrios Loll, MD   2 Units at 05/03/19 1703  . ipratropium-albuterol (DUONEB) 0.5-2.5 (3) MG/3ML nebulizer solution 3 mL  3 mL Nebulization Q6H PRN Mayo, Pete Pelt, MD      . morphine 2 MG/ML injection 2 mg  2 mg Intravenous Q4H PRN Lance Coon, MD   2 mg at 05/02/19 1625  . ondansetron (ZOFRAN) tablet 4 mg  4 mg Oral Q6H PRN Mayo, Pete Pelt, MD   4 mg at 04/30/19 2332   Or  . ondansetron (ZOFRAN) injection 4 mg  4 mg Intravenous Q6H PRN Mayo, Pete Pelt, MD   4 mg at 04/30/19 0950  . oxyCODONE (Oxy IR/ROXICODONE) immediate release tablet 5 mg  5 mg Oral Q6H PRN Lance Coon, MD   5 mg at 05/03/19 1314  . pantoprazole (PROTONIX) EC tablet 40 mg  40 mg Oral Daily Mayo, Pete Pelt, MD   40 mg at 05/03/19 [id number]  . polyethylene glycol (MIRALAX / GLYCOLAX) packet 17 g  17 g Oral Daily PRN Mayo, Pete Pelt, MD      . polyvinyl alcohol (LIQUIFILM TEARS) 1.4 % ophthalmic solution 1 drop  1 drop Right Eye TID Demetrios Loll, MD   1 drop at 05/03/19 1706  . sevelamer carbonate (RENVELA) tablet 1,600 mg  1,600 mg Oral TID WC Mayo, Pete Pelt, MD   1,600 mg at 05/02/19 1605  . umeclidinium-vilanterol (ANORO ELLIPTA) 62.5-25 MCG/INH 1 puff  1 puff Inhalation Daily Mayo, Pete Pelt, MD   1 puff at 05/03/19 [id number]     Discharge Medications: Please see discharge summary for a list of discharge medications.  Relevant Imaging Results:  Relevant Lab Results:   Additional Information SSN [id number]  Ross Ludwig, LCSW

## 2019-05-04 LAB — CBC
HCT: 32.3 % — ABNORMAL LOW (ref 39.0–52.0)
Hemoglobin: 9.9 g/dL — ABNORMAL LOW (ref 13.0–17.0)
MCH: 30 pg (ref 26.0–34.0)
MCHC: 30.7 g/dL (ref 30.0–36.0)
MCV: 97.9 fL (ref 80.0–100.0)
Platelets: 340 10*3/uL (ref 150–400)
RBC: 3.3 MIL/uL — ABNORMAL LOW (ref 4.22–5.81)
RDW: 17 % — ABNORMAL HIGH (ref 11.5–15.5)
WBC: 9.5 10*3/uL (ref 4.0–10.5)
nRBC: 0 % (ref 0.0–0.2)

## 2019-05-04 LAB — BORDETELLA PERTUSSIS PCR
B parapertussis, DNA: NEGATIVE
B pertussis, DNA: NEGATIVE

## 2019-05-04 LAB — GLUCOSE, CAPILLARY
Glucose-Capillary: 116 mg/dL — ABNORMAL HIGH (ref 70–99)
Glucose-Capillary: 266 mg/dL — ABNORMAL HIGH (ref 70–99)
Glucose-Capillary: 72 mg/dL (ref 70–99)

## 2019-05-04 LAB — BASIC METABOLIC PANEL
Anion gap: 14 (ref 5–15)
BUN: 58 mg/dL — ABNORMAL HIGH (ref 6–20)
CO2: 25 mmol/L (ref 22–32)
Calcium: 8.3 mg/dL — ABNORMAL LOW (ref 8.9–10.3)
Chloride: 100 mmol/L (ref 98–111)
Creatinine, Ser: 8.62 mg/dL — ABNORMAL HIGH (ref 0.61–1.24)
GFR calc Af Amer: 7 mL/min — ABNORMAL LOW (ref >60)
GFR calc non Af Amer: 6 mL/min — ABNORMAL LOW (ref >60)
Glucose, Bld: 128 mg/dL — ABNORMAL HIGH (ref 70–99)
Potassium: 5.1 mmol/L (ref 3.5–5.1)
Sodium: 139 mmol/L (ref 135–145)

## 2019-05-04 MED ORDER — EPOETIN ALFA 10000 UNIT/ML IJ SOLN
4000.0000 [IU] | INTRAMUSCULAR | Status: DC
  Administered 2019-05-04 – 2019-05-13 (×4): 4000 [IU] via INTRAVENOUS

## 2019-05-04 NOTE — Progress Notes (Signed)
Patient Name: Jeremy [name] Date of Encounter: 05/04/2019  Hospital Problem List     Active Problems:   Syncope   Atrial fibrillation with rapid ventricular response [facility])    Patient Profile     58 yo with history of cough syncope and afib with rvr, esrd. Had afib with rvr on hemodialysis. Converted to nsr with amiodarone. Currently on po amiodarone  Subjective   Still with some fatigue. Still with occasional cough.   Inpatient Medications    . amiodarone  200 mg Oral BID  . benzonatate  200 mg Oral TID  . calcium acetate  667 mg Oral TID WC  . Chlorhexidine Gluconate Cloth  6 each Topical Daily  . cinacalcet  30 mg Oral 3 times weekly  . fluticasone  2 spray Each Nare Daily  . heparin  5,000 Units Subcutaneous Q8H  . insulin aspart  0-5 Units Subcutaneous QHS  . insulin aspart  0-9 Units Subcutaneous TID WC  . pantoprazole  40 mg Oral Daily  . polyvinyl alcohol  1 drop Right Eye TID  . sevelamer carbonate  1,600 mg Oral TID WC  . umeclidinium-vilanterol  1 puff Inhalation Daily    Vital Signs    Vitals:   05/03/19 0501 05/03/19 0900 05/03/19 1640 05/03/19 2000  BP: 134/78 124/62 116/61 122/64  Pulse: 90 83 82 81  Resp: 20 18 20    Temp: 98.8 F (37.1 C) 98.3 F (36.8 C) 98.6 F (37 C) 99.1 F (37.3 C)  TempSrc: Oral Oral Oral Oral  SpO2: 95% 93% 97% 98%  Weight:      Height:       No intake or output data in the 24 hours ending 05/04/19 0746 Filed Weights   04/30/19 1617 05/01/19 0310 05/02/19 0417  Weight: 65.8 kg 66.2 kg 66.5 kg    Physical Exam    GEN: Well nourished, well developed, in no acute distress.  HEENT: normal.  Neck: Supple, no JVD, carotid bruits, or masses. Cardiac: RRR, no murmurs, rubs, or gallops. No clubbing, cyanosis, edema.  Radials/DP/PT 2+ and equal bilaterally.  Respiratory:  Respirations regular and unlabored, clear to auscultation bilaterally. GI: Soft, nontender, nondistended, BS + x 4. MS: no deformity or atrophy. Skin:  warm and dry, no rash. Neuro:  Strength and sensation are intact. Psych: Normal affect.  Labs    CBC Recent Labs    05/02/19 0900 05/04/19 0559  WBC 8.2 9.5  HGB 9.1* 9.9*  HCT 29.5* 32.3*  MCV 98.3 97.9  PLT 324 [id number]   Basic Metabolic Panel Recent Labs    05/02/19 0558 05/04/19 0559  NA 136 139  K 4.7 5.1  CL 97* 100  CO2 24 25  GLUCOSE 282* 128*  BUN 49* 58*  CREATININE 7.37* 8.62*  CALCIUM 7.8* 8.3*   Liver Function Tests No results for input(s): AST, ALT, ALKPHOS, BILITOT, PROT, ALBUMIN in the last 72 hours. No results for input(s): LIPASE, AMYLASE in the last 72 hours. Cardiac Enzymes No results for input(s): CKTOTAL, CKMB, CKMBINDEX, TROPONINI in the last 72 hours. BNP No results for input(s): BNP in the last 72 hours. D-Dimer No results for input(s): DDIMER in the last 72 hours. Hemoglobin A1C Recent Labs    05/02/19 0900  HGBA1C 7.6*   Fasting Lipid Panel No results for input(s): CHOL, HDL, LDLCALC, TRIG, CHOLHDL, LDLDIRECT in the last 72 hours. Thyroid Function Tests No results for input(s): TSH, T4TOTAL, T3FREE, THYROIDAB in the last 72 hours.  Invalid  input(s): FREET3  Telemetry    Sinus rhythm  ECG    afib with rvr--nsr  Radiology    Dg Chest 2 View  Result Date: 04/30/2019 CLINICAL DATA:  Cough and shortness of breath EXAM: CHEST - 2 VIEW COMPARISON:  Chest radiograph 04/29/2019 FINDINGS: Monitoring lead overlies the patient. Stable cardiomegaly. Mild bilateral interstitial pulmonary opacities. Retrocardiac consolidation. Possible small left pleural effusion. No pneumothorax. Stent material over the left axilla. IMPRESSION: Cardiomegaly with pulmonary vascular redistribution and mild interstitial edema. Retrocardiac consolidation may represent small left pleural effusion and underlying atelectasis or infection. Electronically Signed   By: Lovey Newcomer M.D.   On: 04/30/2019 11:04   Dg Chest 2 View  Result Date: 04/20/2019 CLINICAL DATA:   Chest pain EXAM: CHEST - 2 VIEW COMPARISON:  04/02/2019 FINDINGS: Cardiomegaly. No pleural effusion. Further clearing of right lung base infiltrate. Streaky basilar opacities, likely atelectasis. Aortic atherosclerosis. No pneumothorax. Left subclavian region stent. IMPRESSION: 1. Cardiomegaly without edema or pleural effusion 2. Further clearing of previously noted right lower lobe infiltrate Electronically Signed   By: Donavan Foil M.D.   On: 04/20/2019 17:11   Ct Head Wo Contrast  Result Date: 05/03/2019 CLINICAL DATA:  [place] 2 days ago with headache and vomiting since then. EXAM: CT HEAD WITHOUT CONTRAST TECHNIQUE: Contiguous axial images were obtained from the base of the skull through the vertex without intravenous contrast. COMPARISON:  04/29/2019 FINDINGS: Brain: Mild generalized volume loss. Chronic small-vessel ischemic changes of the white matter. 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 No sign of recent infarction, mass lesion, hemorrhage, hydrocephalus or extra-axial collection. Vascular: There is atherosclerotic calcification of the major vessels at the base of the brain. Skull: Negative Sinuses/Orbits: Clear/normal Other: None IMPRESSION: No acute or traumatic finding. Extensive vascular calcification of the major vessels at the base of the brain. Chronic small-vessel ischemic changes of the white matter. 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 Electronically Signed   By: Nelson Chimes M.D.   On: 05/03/2019 18:45   Ct Head Wo Contrast  Result Date: 04/29/2019 CLINICAL DATA:  Head trauma, syncopal episode EXAM: CT HEAD WITHOUT CONTRAST TECHNIQUE: Contiguous axial images were obtained from the base of the skull through the vertex without intravenous contrast. COMPARISON:  02/14/2019 FINDINGS: Brain: No evidence of acute infarction, hemorrhage, extra-axial collection, ventriculomegaly, or mass effect.  Generalized cerebral atrophy. Periventricular white matter low attenuation likely secondary to microangiopathy. Vascular: Cerebrovascular atherosclerotic calcifications are noted. Skull: Negative for fracture or focal lesion. Sinuses/Orbits: Visualized portions of the orbits are unremarkable. Visualized portions of the paranasal sinuses and mastoid air cells are unremarkable. Other: None. IMPRESSION: No acute intracranial pathology. Electronically Signed   By: Kathreen Devoid   On: 04/29/2019 16:46   Ct Cervical Spine Wo Contrast  Result Date: 04/29/2019 CLINICAL DATA:  Cervical spine trauma due to syncope. Right-sided neck pain EXAM: CT CERVICAL SPINE WITHOUT CONTRAST TECHNIQUE: Multidetector CT imaging of the cervical spine was performed without intravenous contrast. Multiplanar CT image reconstructions were also generated. COMPARISON:  None. FINDINGS: Alignment: Levocurvature.  No listhesis. Skull base and vertebrae: No acute fracture Soft tissues and spinal canal: No prevertebral fluid or swelling. No visible canal hematoma. Extensive atherosclerosis; there is history of end-stage renal disease. Disc levels: Generalized disc degeneration with large Schmorl's nodes in the C4 and C7 vertebrae. Facet arthropathy with erosive features or subchondral cystic change. Disc narrowing and uncovertebral spurring causes mid cervical foraminal narrowing  most notable on the right at C4-5. Upper chest: Patchy air trapping IMPRESSION: Negative for fracture. Multilevel degenerative disease. Electronically Signed   By: Monte Fantasia M.D.   On: 04/29/2019 19:16   Dg Chest Port 1 View  Result Date: 05/03/2019 CLINICAL DATA:  58 year old male with syncope. EXAM: PORTABLE CHEST 1 VIEW COMPARISON:  Chest radiograph dated 04/30/2019 FINDINGS: Left lung base opacity likely combination of a small pleural effusion and associated atelectasis although infiltrate is not excluded. Clinical correlation is recommended. Bilateral streaky  densities may represent atelectasis or mild congestion. There is no pneumothorax. Stable cardiomegaly. Atherosclerotic calcification of the aorta. No acute osseous pathology. IMPRESSION: 1. Probable small left pleural effusion and left lung base atelectasis or infiltrate. 2. Cardiomegaly with possible mild vascular congestion. Electronically Signed   By: Anner Crete M.D.   On: 05/03/2019 21:06   Dg Chest Portable 1 View  Result Date: 04/29/2019 CLINICAL DATA:  Syncope, shortness of breath, cough EXAM: PORTABLE CHEST 1 VIEW COMPARISON:  04/20/2019 FINDINGS: Cardiomegaly, vascular congestion. Increasing left basilar atelectasis or infiltrate. No overt edema. No effusions or acute bony abnormality. IMPRESSION: Cardiomegaly, vascular congestion. Increasing left basilar atelectasis or infiltrate. Electronically Signed   By: Rolm Baptise M.D.   On: 04/29/2019 16:51   Dg Shoulder Left  Result Date: 04/29/2019 CLINICAL DATA:  Left shoulder pain after syncope, fall. EXAM: LEFT SHOULDER - 2+ VIEW COMPARISON:  None. FINDINGS: There is no evidence of fracture or dislocation. Minimal glenohumeral osteoarthritis. There is no evidence of arthropathy or other focal bone abnormality. Soft tissues are unremarkable. Vascular stent in the left axilla. IMPRESSION: No fracture or malalignment of the left shoulder. Electronically Signed   By: Keith Rake M.D.   On: 04/29/2019 19:18    Assessment & Plan    afib-currently in nsr will continue with amiodarone at 200 mg bid. Long term, would use 200 mg dialy. Not ideal candidate for chronic antiocagulation.  We will continue to treat and follow rhythm.  His syncope appears to be cough syncope.  Signed, Javier Docker Dohn Stclair MD 05/04/2019, 7:46 AM  Pager: [phone]

## 2019-05-04 NOTE — Progress Notes (Signed)
This note also relates to the following rows which could not be included: Pulse Rate - Cannot attach notes to unvalidated device data Resp - Cannot attach notes to unvalidated device data BP - Cannot attach notes to unvalidated device data SpO2 - Cannot attach notes to unvalidated device data  Hd discontinued per patient request. Dr Candiss Norse notified.

## 2019-05-04 NOTE — Progress Notes (Signed)
[place] at [place] NAME: Jeremy [name]    MR#:  [id number]  DATE OF BIRTH:  [birth year]  SUBJECTIVE:  CHIEF COMPLAINT:   Chief Complaint  Patient presents with   Near Syncope   The patient has worsening hortness of breath and cough.  On oxygen 1 L by nasal cannula this morning.  No syncope episode for the past 3 days. REVIEW OF SYSTEMS:  Review of Systems  Constitutional: Positive for malaise/fatigue. Negative for chills and fever.  HENT: Negative for sore throat.   Eyes: Negative for blurred vision and double vision.  Respiratory: Positive for cough and shortness of breath. Negative for hemoptysis, sputum production, wheezing and stridor.   Cardiovascular: Negative for chest pain, palpitations, orthopnea and leg swelling.  Gastrointestinal: Negative for abdominal pain, blood in stool, diarrhea, melena, nausea and vomiting.  Genitourinary: Negative for dysuria, flank pain and hematuria.  Musculoskeletal: Negative for back pain, joint pain and neck pain.  Skin: Negative for itching and rash.  Neurological: Negative for dizziness, sensory change, focal weakness, seizures, loss of consciousness, weakness and headaches.  Endo/Heme/Allergies: Negative for polydipsia.  Psychiatric/Behavioral: Negative for depression. The patient is not nervous/anxious.     DRUG ALLERGIES:  No Known Allergies VITALS:  Blood pressure 127/60, pulse 87, temperature 98.6 F (37 C), temperature source Oral, resp. rate 18, height 5\' 6"  (1.676 m), weight 64.7 kg, SpO2 94 %. PHYSICAL EXAMINATION:  Physical Exam HENT:     Head: Normocephalic.     Mouth/Throat:     Mouth: Mucous membranes are moist.  Eyes:     General: No scleral icterus.    Conjunctiva/sclera: Conjunctivae normal.     Pupils: Pupils are equal, round, and reactive to light.  Neck:     Musculoskeletal: Normal range of motion and neck supple.     Vascular: No JVD.     Trachea: No tracheal  deviation.     Comments: Positive JVD. Cardiovascular:     Rate and Rhythm: Normal rate. Rhythm irregular.     Heart sounds: Normal heart sounds. No murmur. No gallop.   Pulmonary:     Effort: Pulmonary effort is normal. No respiratory distress.     Breath sounds: No stridor. Rales present. No wheezing or rhonchi.  Abdominal:     General: Bowel sounds are normal. There is no distension.     Palpations: Abdomen is soft.     Tenderness: There is no abdominal tenderness. There is no rebound.  Musculoskeletal: Normal range of motion.        General: No tenderness.     Right lower leg: No edema.     Left lower leg: No edema.  Skin:    Findings: No erythema or rash.  Neurological:     General: No focal deficit present.     Mental Status: He is alert and oriented to person, place, and time.     Cranial Nerves: No cranial nerve deficit.  Psychiatric:        Mood and Affect: Mood normal.    LABORATORY PANEL:  Male CBC Recent Labs  Lab 05/04/19 0559  WBC 9.5  HGB 9.9*  HCT 32.3*  PLT 340   ------------------------------------------------------------------------------------------------------------------ Chemistries  Recent Labs  Lab 05/04/19 0559  NA 139  K 5.1  CL 100  CO2 25  GLUCOSE 128*  BUN 58*  CREATININE 8.62*  CALCIUM 8.3*   RADIOLOGY:  Ct Head Wo Contrast  Result Date: 05/03/2019 CLINICAL DATA:  Fell 2 days ago with headache and vomiting since then. EXAM: CT HEAD WITHOUT CONTRAST TECHNIQUE: Contiguous axial images were obtained from the base of the skull through the vertex without intravenous contrast. COMPARISON:  04/29/2019 FINDINGS: Brain: Mild generalized volume loss. Chronic small-vessel ischemic changes of the white matter. 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 No sign of recent infarction, mass lesion, hemorrhage, hydrocephalus or extra-axial collection. Vascular: There is atherosclerotic calcification of the  major vessels at the base of the brain. Skull: Negative Sinuses/Orbits: Clear/normal Other: None IMPRESSION: No acute or traumatic finding. Extensive vascular calcification of the major vessels at the base of the brain. Chronic small-vessel ischemic changes of the white matter. 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 Electronically Signed   By: Nelson Chimes M.D.   On: 05/03/2019 18:45   Dg Chest Port 1 View  Result Date: 05/03/2019 CLINICAL DATA:  58 year old male with syncope. EXAM: PORTABLE CHEST 1 VIEW COMPARISON:  Chest radiograph dated 04/30/2019 FINDINGS: Left lung base opacity likely combination of a small pleural effusion and associated atelectasis although infiltrate is not excluded. Clinical correlation is recommended. Bilateral streaky densities may represent atelectasis or mild congestion. There is no pneumothorax. Stable cardiomegaly. Atherosclerotic calcification of the aorta. No acute osseous pathology. IMPRESSION: 1. Probable small left pleural effusion and left lung base atelectasis or infiltrate. 2. Cardiomegaly with possible mild vascular congestion. Electronically Signed   By: Anner Crete M.D.   On: 05/03/2019 21:06   ASSESSMENT AND PLAN:   Syncope- likely cough syncope, given that he passes out every time he has a severe coughing episode.  Recently hospitalized at [facility] from 8/13-8/15 and had an unremarkable syncope work-up. Continue telemetry monitor.  A. fib with RVR.  Heart rate is controlled. Amiodarone 150 mg IV bolus and started amiodarone drip per Dr. Ubaldo Glassing. Converted to p.o. amiodarone per Dr. Ubaldo Glassing.   Chronic cough- CHF or COPD. Follows with [facility] for his COPD. Recent RHC 03/30/19 showed very elevated filling pressures. -CXR with left basilar atelectasis vs infiltrate -Continue antitussives -Continue home inhalers -Duonebs prn.  Elevated troponin- likely demand ischemia.  Patient denies any active chest  pain.  EKG with new T wave inversions in the inferior leads.  Troponins are stable.  Left shoulder pain- occurred after a fall. -Left shoulder x-ray: No fracture. -Gabapentin, flexeril, and tylenol prn  ESRD on HD MWF -Continue Sensipar and Phoslo, continue with hemodialysis as scheduled per Dr. Candiss Norse.  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 Chest x-ray report cardiomegaly with pulmonary vascular redistribution and mild interstitial edema. Delene Loll was discontinued due to hyperkalemia.  Continue hemodialysis. Try to wean off oxygen.  Hyperkalemia.  Potassium was 6.0.  Given Lokelma and hemodialysis, improved.  Anemia of chronic kidney disease- hemoglobin at baseline  Tobacco use Smoking cessation was counseled for 3 to 4 minutes, nicotine patch prn Fall.  Fall precaution.  New diagnosis of diabetes type 2.  Hemoglobin A1c 9.1. On sliding scale.  Discussed with Dr. Candiss Norse. All the records are reviewed and case discussed with Care Management/Social Worker. Management plans discussed with the patient, his sister and they are in agreement.  CODE STATUS: Full Code  TOTAL TIME TAKING CARE OF THIS PATIENT: 28 minutes.   More than 50% of the time was spent in counseling/coordination of care: YES  POSSIBLE D/C IN 2 DAYS, DEPENDING ON CLINICAL CONDITION.   Demetrios Loll M.D on 05/04/2019 at 2:35 PM  Between 7am to 6pm - Pager - [phone]  After 6pm go to [email or web address] - [place] [facility]

## 2019-05-04 NOTE — Progress Notes (Signed)
[facility]  ROUNDING NOTE   Subjective:   sinus rhythm at present Continues to report cough although it appears to be less oftern Coughing spell did not occur while I was in room with him No edema Feeling SOB, asking for 3 L volume removal  Objective:  Vital signs in last 24 hours:  Temp:  [98.6 F (37 C)-99.1 F (37.3 C)] 98.9 F (37.2 C) (08/26 0834) Pulse Rate:  [81-89] 89 (08/26 0834) Resp:  [18-20] 18 (08/26 0834) BP: (116-127)/(60-64) 127/60 (08/26 0834) SpO2:  [94 %-98 %] 94 % (08/26 0834)  Weight change:  Filed Weights   04/30/19 1617 05/01/19 0310 05/02/19 0417  Weight: 65.8 kg 66.2 kg 66.5 kg    Intake/Output: No intake/output data recorded.   Intake/Output this shift:  No intake/output data recorded.  Physical Exam: General: NAD,   Head: Normocephalic, atraumatic. Moist oral mucosal membranes  Eyes: Anicteric   Neck: Supple,    Lungs:  Coarse b/l  Heart: regular  Abdomen:  Soft, nontender,   Extremities:  no peripheral edema.  Neurologic: Nonfocal, moving all four extremities  Skin: No lesions  Access: Left AVF    Basic Metabolic Panel: Recent Labs  Lab 04/30/19 1846 05/01/19 0710 05/01/19 1650 05/02/19 0558 05/04/19 0559  NA 137 135 138 136 139  K 5.7* 6.0* 3.5 4.7 5.1  CL 100 97* 97* 97* 100  CO2 21* 19* 27 24 25   GLUCOSE 270* 297* 198* 282* 128*  BUN 58* 66* 34* 49* 58*  CREATININE 7.54* 8.61* 4.53* 7.37* 8.62*  CALCIUM 8.4* 7.8* 8.2* 7.8* 8.3*    Liver Function Tests: No results for input(s): AST, ALT, ALKPHOS, BILITOT, PROT, ALBUMIN in the last 168 hours. No results for input(s): LIPASE, AMYLASE in the last 168 hours. No results for input(s): AMMONIA in the last 168 hours.  CBC: Recent Labs  Lab 04/29/19 1522 04/30/19 0559 05/02/19 0900 05/04/19 0559  WBC 7.6  7.6 10.5 8.2 9.5  NEUTROABS 6.1  --   --   --   HGB 9.4*  9.4* 10.0* 9.1* 9.9*  HCT 29.5*  29.6* 32.4* 29.5* 32.3*  MCV 95.5  95.2 97.0 98.3  97.9  PLT 375  342 401* 324 340    Cardiac Enzymes: No results for input(s): CKTOTAL, CKMB, CKMBINDEX, TROPONINI in the last 168 hours.  BNP: Invalid input(s): POCBNP  CBG: Recent Labs  Lab 05/03/19 1138 05/03/19 1655 05/03/19 1726 05/03/19 2100 05/04/19 0740  GLUCAP 195* 168* 171* 118* 116*    Microbiology: Results for orders placed or performed during the hospital encounter of 04/29/19  SARS Coronavirus 2 [facility] order, Performed in [facility] hospital lab) Nasopharyngeal Nasopharyngeal Swab     Status: None   Collection Time: 04/29/19  6:03 PM   Specimen: Nasopharyngeal Swab  Result Value Ref Range Status   SARS Coronavirus 2 NEGATIVE NEGATIVE Final    Comment: (NOTE) If result is NEGATIVE SARS-CoV-2 target nucleic acids are NOT DETECTED. The SARS-CoV-2 RNA is generally detectable in upper and lower  respiratory specimens during the acute phase of infection. The lowest  concentration of SARS-CoV-2 viral copies this assay can detect is 250  copies / mL. A negative result does not preclude SARS-CoV-2 infection  and should not be used as the sole basis for treatment or other  patient management decisions.  A negative result may occur with  improper specimen collection / handling, submission of specimen other  than nasopharyngeal swab, presence of viral mutation(s) within the  areas targeted by this assay, and inadequate number of viral copies  (<250 copies / mL). A negative result must be combined with clinical  observations, patient history, and epidemiological information. If result is POSITIVE SARS-CoV-2 target nucleic acids are DETECTED. The SARS-CoV-2 RNA is generally detectable in upper and lower  respiratory specimens dur ing the acute phase of infection.  Positive  results are indicative of active infection with SARS-CoV-2.  Clinical  correlation with patient history and other diagnostic information is  necessary to determine patient infection status.   Positive results do  not rule out bacterial infection or co-infection with other viruses. If result is PRESUMPTIVE POSTIVE SARS-CoV-2 nucleic acids MAY BE PRESENT.   A presumptive positive result was obtained on the submitted specimen  and confirmed on repeat testing.  While 2019 novel coronavirus  (SARS-CoV-2) nucleic acids may be present in the submitted sample  additional confirmatory testing may be necessary for epidemiological  and / or clinical management purposes  to differentiate between  SARS-CoV-2 and other Sarbecovirus currently known to infect humans.  If clinically indicated additional testing with an alternate test  methodology [phone]) is advised. The SARS-CoV-2 RNA is generally  detectable in upper and lower respiratory sp ecimens during the acute  phase of infection. The expected result is Negative. Fact Sheet for Patients:  StrictlyIdeas.no Fact Sheet for Healthcare Providers: BankingDealers.co.za This test is not yet approved or cleared by the [place] FDA and has been authorized for detection and/or diagnosis of SARS-CoV-2 by FDA under an Emergency Use Authorization (EUA).  This EUA will remain in effect (meaning this test can be used) for the duration of the COVID-19 declaration under Section 564(b)(1) of the Act, 21 U.S.C. section 360bbb-3(b)(1), unless the authorization is terminated or revoked sooner. Performed at [facility], [address] [place], [place] [address]   Bordetella pertussis PCR     Status: None   Collection Time: 04/29/19 11:27 PM   Specimen: Nasopharyngeal Swab  Result Value Ref Range Status   B pertussis, DNA Negative Negative Final   B parapertussis, DNA Negative Negative Final    Comment: (NOTE) This test was developed and its performance characteristics determined by [employer]. It has not been cleared or approved by the U.S. Food and Drug Administration. The  FDA has determined that such clearance or approval is not necessary. This test is used for clinical purposes. It should not be regarded as investigational or research. Performed At: [facility] [place], [place] [id number] Rush Farmer MD [id number]     Coagulation Studies: No results for input(s): LABPROT, INR in the last 72 hours.  Urinalysis: No results for input(s): COLORURINE, LABSPEC, PHURINE, GLUCOSEU, HGBUR, BILIRUBINUR, KETONESUR, PROTEINUR, UROBILINOGEN, NITRITE, LEUKOCYTESUR in the last 72 hours.  Invalid input(s): APPERANCEUR    Imaging: Ct Head Wo Contrast  Result Date: 05/03/2019 CLINICAL DATA:  [place] 2 days ago with headache and vomiting since then. EXAM: CT HEAD WITHOUT CONTRAST TECHNIQUE: Contiguous axial images were obtained from the base of the skull through the vertex without intravenous contrast. COMPARISON:  04/29/2019 FINDINGS: Brain: Mild generalized volume loss. Chronic small-vessel ischemic changes of the white matter. 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 No sign of recent infarction, mass lesion, hemorrhage, hydrocephalus or extra-axial collection. Vascular: There is atherosclerotic calcification of the major vessels at the base of the brain. Skull: Negative Sinuses/Orbits: Clear/normal Other: None IMPRESSION: No acute or traumatic finding. Extensive vascular calcification of the  major vessels at the base of the brain. Chronic small-vessel ischemic changes of the white matter. 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 Electronically Signed   By: Nelson Chimes M.D.   On: 05/03/2019 18:45   Dg Chest Port 1 View  Result Date: 05/03/2019 CLINICAL DATA:  58 year old male with syncope. EXAM: PORTABLE CHEST 1 VIEW COMPARISON:  Chest radiograph dated 04/30/2019 FINDINGS: Left lung base opacity likely combination of a small pleural effusion and associated  atelectasis although infiltrate is not excluded. Clinical correlation is recommended. Bilateral streaky densities may represent atelectasis or mild congestion. There is no pneumothorax. Stable cardiomegaly. Atherosclerotic calcification of the aorta. No acute osseous pathology. IMPRESSION: 1. Probable small left pleural effusion and left lung base atelectasis or infiltrate. 2. Cardiomegaly with possible mild vascular congestion. Electronically Signed   By: Anner Crete M.D.   On: 05/03/2019 21:06     Medications:    . amiodarone  200 mg Oral BID  . benzonatate  200 mg Oral TID  . calcium acetate  667 mg Oral TID WC  . Chlorhexidine Gluconate Cloth  6 each Topical Daily  . cinacalcet  30 mg Oral 3 times weekly  . fluticasone  2 spray Each Nare Daily  . heparin  5,000 Units Subcutaneous Q8H  . insulin aspart  0-5 Units Subcutaneous QHS  . insulin aspart  0-9 Units Subcutaneous TID WC  . pantoprazole  40 mg Oral Daily  . polyvinyl alcohol  1 drop Right Eye TID  . sevelamer carbonate  1,600 mg Oral TID WC  . umeclidinium-vilanterol  1 puff Inhalation Daily   acetaminophen **OR** acetaminophen, albuterol, cyclobenzaprine, gabapentin, guaiFENesin-codeine, ipratropium-albuterol, morphine injection, ondansetron **OR** ondansetron (ZOFRAN) IV, oxyCODONE, polyethylene glycol  Assessment/ Plan:  Jeremy [name] is a 58 y.o. black male with end stage renal disease on hemodialysis, hypertension, congestive heart failure, diabetes mellitus type II, history of substance abuse, COPD with ongoing tobacco use  [facility] MWF 65kg 180 minutes   1. End stage renal disease with hyperkalemia: HD today UF goal 2-3 L as tolerated Outpatient EDW 65 kg  2. Syncope: diagnosed as [facility]-tussive syncope at [facility].   Hypotensive on this admission.  Atrial fibrillation with rapid ventricular response treated with amiodarone.   - cardiology team following - no anticoagulation per cardiology.    3. Anemia of chronic kidney disease:  -  Lab Results  Component Value Date   HGB 9.9 (L) 05/04/2019   - EPO on MWF   4. Secondary Hyperparathyroidism:  Lab Results  Component Value Date   PTH 708 (H) 11/24/2018   CALCIUM 8.3 (L) 05/04/2019   PHOS 6.3 (H) 11/24/2018   - Calcium acetate with meals.  - Cinacalcet three times a week   LOS: 4 Shaneisha Burkel 8/26/202011:10 AM

## 2019-05-05 ENCOUNTER — Inpatient Hospital Stay: Payer: Medicare Other

## 2019-05-05 LAB — GLUCOSE, CAPILLARY
Glucose-Capillary: 202 mg/dL — ABNORMAL HIGH (ref 70–99)
Glucose-Capillary: 150 mg/dL — ABNORMAL HIGH (ref 70–99)

## 2019-05-05 MED ORDER — RENA-VITE PO TABS
1.0000 | ORAL_TABLET | Freq: Every day | ORAL | Status: DC
  Administered 2019-05-06 – 2019-05-12 (×6): 1 via ORAL
  Filled 2019-05-05 (×9): qty 1

## 2019-05-05 MED ORDER — LIVING WELL WITH DIABETES BOOK
Freq: Once | Status: DC
  Filled 2019-05-05 (×2): qty 1

## 2019-05-05 MED ORDER — DIPHENHYDRAMINE HCL 25 MG PO CAPS
25.0000 mg | ORAL_CAPSULE | Freq: Four times a day (QID) | ORAL | Status: DC | PRN
  Administered 2019-05-05 – 2019-05-06 (×2): 25 mg via ORAL
  Filled 2019-05-05 (×2): qty 1

## 2019-05-05 MED ORDER — NEPRO/CARBSTEADY PO LIQD
237.0000 mL | Freq: Two times a day (BID) | ORAL | Status: DC
  Administered 2019-05-06 – 2019-05-13 (×9): 237 mL via ORAL

## 2019-05-05 NOTE — Progress Notes (Signed)
Jeremy [name]  Patient's refusal of the cardiac monitor .

## 2019-05-05 NOTE — Progress Notes (Signed)
Inpatient Diabetes Program Recommendations  AACE/ADA: New Consensus Statement on Inpatient Glycemic Control  Target Ranges:  Prepandial:   less than 140 mg/dL      [facility] postprandial:   less than 180 mg/dL (1-2 hours)      Critically ill patients:  140 - 180 mg/dL   Results for Jeremy, [name] (MRN [id number]) as of 05/05/2019 13:32  Ref. Range 05/04/2019 07:40 05/04/2019 17:47 05/04/2019 21:42 05/05/2019 07:46 05/05/2019 11:44  Glucose-Capillary Latest Ref Range: 70 - 99 mg/dL 116 (H) 266 (H) 72 202 (H) 150 (H)  Results for Jeremy, [name] (MRN [id number]) as of 05/05/2019 13:32  Ref. Range 11/20/2018 05:20 05/02/2019 09:00  Hemoglobin A1C Latest Ref Range: 4.8 - 5.6 % 5.7 (H) 7.6 (H)   Review of Glycemic Control  Diabetes history: No Outpatient Diabetes medications: NA Current orders for Inpatient glycemic control: Novolog 0-9 units TID with meals, Novolog 0-5 units QHS  Inpatient Diabetes Program Recommendations:   A1C: A1C 7.6% on 05/02/19 indicating an average glucose of 171 mg/dl over the past 2-3 months. Hemoglobin noted to be 9.1 g/dL on 05/02/19 so A1C results not completely accurate. Recent steroid use likely contributing to elevated A1C.  NOTE: Spoke with patient about new diabetes diagnosis. Patient stated "I am not claiming I have diabetes. So that means I don't have diabetes I have enough issues going on as it is."  Provided emotional support. Tried to explain that since initial glucose was elevated an A1C was checked to provide more information on how glucose has been trending over the past 2-3 months.   Discussed A1C results (7.6% on 05/02/19) and explained what an A1C is and informed patient that his current A1C indicates an average glucose of 171 mg/dl over the past 2-3 months. Informed patient that it was noted that he was on steroids as an outpatient which is contributing to elevated A1C and that it would be recommended that he be sure to ask PCP to recheck A1C in 3-6 months.  Patient stated "I  will not mention it to anyone because I already told you, I don't have diabetes. I am not claiming it."  Inquired about family DM history and patient reports that his mother and both sisters have DM. Explained to patient that I would like to provide some education on DM so that he has some knowledge about it.  Discussed basic pathophysiology of DM Type 2, basic home care, importance of checking CBGs and maintaining good CBG control to prevent long-term and short-term complications. Reviewed glucose and A1C goals.  Discussed impact of nutrition, exercise, stress, sickness, and medications on diabetes control. Patient again stated, he did not really need to hear any of the information because "I don't have diabetes. Don't you understand what I am saying. I don't need to know any of this."  Again encouraged patient to talk with PCP about glucose and repeating an A1C. Thanked patient for his time and informed him it would be noted in the chart that he does not claim DM.     Thanks, Barnie Alderman, RN, MSN, CDE Diabetes Coordinator Inpatient Diabetes Program [phone] (Team Pager from 8am to 5pm)

## 2019-05-05 NOTE — TOC Progression Note (Addendum)
Transition of Care [facility]) - Progression Note    Patient Details  Name: Jeremy [name] MRN: [id number] Date of Birth: Nov 05, 1960  [facility] CM/SW Contact  Shade Flood, LCSW Phone Number: 05/05/2019, 12:20 PM  Clinical Narrative:     TOC following. Spoke with pt today to provide with bed offers. Pt requests [facility]. Provided information and answered pt's questions about SNF rehab.   Updated Tina at [facility] that pt accepts bed offer. Tina aware that pt is HD MWF [address]AM [address] [employer].   Updated MD that pt will need a new negative COVID test prior to transfer.   Addressed CM consult regarding POA-HC. Pt states he would be interested in receiving more information about this. Asked Pastoral Care to see pt to provide information and forms to pt at his request.  DC to [facility] when medically stable.  Expected Discharge Plan: [place] Barriers to Discharge: Continued Medical Work up  Expected Discharge Plan and Services Expected Discharge Plan: [place] In-house Referral: Clinical Social Work   Post Acute Care Choice: [place] Living arrangements for the past 2 months: Apartment Expected Discharge Date: 05/01/19               DME Arranged: N/A DME Agency: NA     Representative spoke with at DME Agency: na             Social Determinants of Health (SDOH) Interventions    Readmission Risk Interventions Readmission Risk Prevention Plan 05/03/2019 05/01/2019 04/04/2019  Transportation Screening Complete Complete -  Medication Review Press photographer) Complete Complete -  PCP or Specialist appointment within 3-5 days of discharge Complete - Complete  HRI or Home Care Consult Complete - -  SW Recovery Care/Counseling Consult Complete - Complete  Palliative Care Screening - Not Applicable -  Skilled Nursing Facility Complete - -  Some recent data might be hidden

## 2019-05-05 NOTE — Progress Notes (Signed)
Ch visited pt to provide AD education. Pt was alert and still presenting to hv a significant cough. Pt shared that it is better when he is sitting in the recliner. Ch completed GOC to document that the patient would prefer his brother Montine Circle to be his HPOA. Ch helped pt to name his concerns and worries regarding his health and hopes for the future. Pt desires to stop smoking when he is d/c. Pt may benefit from a nicotine patch while hospitalized as well as a "high Fall Risk" band and matting to protect him as the patient shared that he sometimes coughs to the point that he passes out. Pt shared his testimony and the grief related to being separated from his family in [place]. Pt seems motivated to improve his well-being but is still not clear on the next steps that would be the most beneficial for him as a dialysis patient since 2012. Ch provided a word of prayer and words of encouragement.        05/05/19 1400  Clinical Encounter Type  Visited With Patient  Visit Type Spiritual support;Social support;Other (Comment) (ADE education )  Referral From Social work  Consult/Referral To Chaplain  Spiritual Encounters  Spiritual Needs Grief support;Emotional;Prayer  Stress Factors  Patient Stress Factors Exhausted;Family relationships;Health changes;Lack of caregivers;Loss of control;Major life changes  Family Stress Factors Family relationships;Major life changes;Loss of control  Advance Directives (For Healthcare)  Does Patient Have a Medical Advance Directive? No  Would patient like information on creating a medical advance directive? Yes (Inpatient - patient defers creating a medical advance directive at this time - Information given)

## 2019-05-05 NOTE — Care Management Important Message (Signed)
Important Message  Patient Details  Name: Jeremy [name] MRN: [id number] Date of Birth: May 18, 1961   Medicare Important Message Given:  Yes     Dannette Barbara 05/05/2019, 1:44 PM

## 2019-05-05 NOTE — Progress Notes (Signed)
Initial Nutrition Assessment  RD working remotely.  DOCUMENTATION CODES:   Not applicable  INTERVENTION:  Provide Nepro Shake po BID, each supplement provides 425 kcal and 19 grams protein.  Provide Rena-vite QHS.  Patient refuses any education on new diagnosis of diabetes at this time. He reported he is not "claiming diabetes" and doesn't "want to hear about it" before hanging up on RD.  NUTRITION DIAGNOSIS:   Inadequate oral intake related to decreased appetite as evidenced by per patient/family report.  GOAL:   Patient will meet greater than or equal to 90% of their needs  MONITOR:   PO intake, Supplement acceptance, Labs, Weight trends, I & O's  REASON FOR ASSESSMENT:   Consult Diet education  ASSESSMENT:   58 year old male with PMHx of HTN, COPD, ESRD on HD admitted with syncope, A-fib RVR, chronic cough, acute respiratory failure with hypoxia due to acute on chronic systolic CHF, also with new diagnosis of DM.   RD consulted for diet education regarding new diagnosis of DM. Spoke with patient over the phone. He reports he recently fell and had a concussion so he does not have a good memory. He reports he has a decreased appetite and is not eating well. Noted he is only eating 50% of his meals here. He reports he is unsure how he usually eats or when decreased appetite began. He is amenable to trying ONS to help meet calorie/protein needs. He reports he does not follow any diets at home. He became upset when RD mentioned reason for consult. He reported he was "not claiming diabetes" and didn't "want to hear about it" and that he wasn't going to be cutting out any sugar or changing how he eats. He then hung up on RD.  Patient reports he is unsure of weight trend because of his concussion. Per Nephrology note from yesterday patient's dry weight is 65 kg. He is currently 64.7 kg (142.6 lbs).  Medications reviewed and include: amiodarone, Phoslo 667 mg TID with meals, Epogen  4000 units during HD, Novolog 0-9 units TID, Novolog 0-5 units QHS, pantoprazole.  Labs reviewed: CBG 72-266, BUN 58, Creatinine 8.62.  NUTRITION - FOCUSED PHYSICAL EXAM:  Unable to complete at this time.  Diet Order:   Diet Order            Diet renal with fluid restriction Fluid restriction: 1200 mL Fluid; Room service appropriate? Yes; Fluid consistency: Thin  Diet effective now             EDUCATION NEEDS:   Not appropriate for education at this time(Patient refusing)  Skin:  Skin Assessment: Reviewed RN Assessment  Last BM:  05/04/2019 per chart  Height:   Ht Readings from Last 1 Encounters:  04/29/19 5\' 6"  (1.676 m)   Weight:   Wt Readings from Last 1 Encounters:  05/04/19 64.7 kg   Ideal Body Weight:  64.5 kg  BMI:  Body mass index is 23.02 kg/m.  Estimated Nutritional Needs:   Kcal:  1700-1900  Protein:  85-95 grams  Fluid:  UOP + 1 L  Willey Blade, MS, RD, LDN Office: [phone] Pager: [phone] After Hours/Weekend Pager: [phone]

## 2019-05-05 NOTE — Progress Notes (Signed)
PT Cancellation Note  Patient Details Name: Jeremy [name] MRN: [id number] DOB: Jul 26, 1961   Cancelled Treatment:    Reason Eval/Treat Not Completed: Other (comment)(Pt with elevated K+ (though value is from 8/26, 5.1 awaiting updated values). PT will continue to monitor and reassess pt's appropriateness for exertional activity.)   Lieutenant Diego PT, DPT 10:19 AM,05/05/19 [phone]

## 2019-05-05 NOTE — Progress Notes (Signed)
[facility]  ROUNDING NOTE   Subjective:     Continues to report cough although it appears to be less oftern Coughing spell did not occur while I was in room with him No edema Post HD weight 64 kg  Objective:  Vital signs in last 24 hours:  Temp:  [98.4 F (36.9 C)-99.2 F (37.3 C)] 98.4 F (36.9 C) (08/27 0748) Pulse Rate:  [81-91] 88 (08/27 0748) Resp:  [13-20] 18 (08/27 0748) BP: (113-145)/(59-73) 134/73 (08/27 0748) SpO2:  [91 %-100 %] 97 % (08/27 0748) Weight:  [64.1 kg-64.7 kg] 64.7 kg (08/26 1431)  Weight change:  Filed Weights   05/04/19 1025 05/04/19 1400 05/04/19 1431  Weight: 67.5 kg 64.1 kg 64.7 kg    Intake/Output: I/O last 3 completed shifts: In: -  Out: 2537 [Other:2537]   Intake/Output this shift:  No intake/output data recorded.  Physical Exam: General: NAD,   Head: Normocephalic, atraumatic. Moist oral mucosal membranes  Eyes: Anicteric   Neck: Supple,    Lungs:  Coarse b/l  Heart: Regular, ?+ pericardial friction rub  Abdomen:  Soft, nontender,   Extremities:  no peripheral edema.  Neurologic: Nonfocal, moving all four extremities  Skin: No lesions  Access: Left AVF    Basic Metabolic Panel: Recent Labs  Lab 04/30/19 1846 05/01/19 0710 05/01/19 1650 05/02/19 0558 05/04/19 0559  NA 137 135 138 136 139  K 5.7* 6.0* 3.5 4.7 5.1  CL 100 97* 97* 97* 100  CO2 21* 19* 27 24 25   GLUCOSE 270* 297* 198* 282* 128*  BUN 58* 66* 34* 49* 58*  CREATININE 7.54* 8.61* 4.53* 7.37* 8.62*  CALCIUM 8.4* 7.8* 8.2* 7.8* 8.3*    Liver Function Tests: No results for input(s): AST, ALT, ALKPHOS, BILITOT, PROT, ALBUMIN in the last 168 hours. No results for input(s): LIPASE, AMYLASE in the last 168 hours. No results for input(s): AMMONIA in the last 168 hours.  CBC: Recent Labs  Lab 04/29/19 1522 04/30/19 0559 05/02/19 0900 05/04/19 0559  WBC 7.6  7.6 10.5 8.2 9.5  NEUTROABS 6.1  --   --   --   HGB 9.4*  9.4* 10.0* 9.1* 9.9*   HCT 29.5*  29.6* 32.4* 29.5* 32.3*  MCV 95.5  95.2 97.0 98.3 97.9  PLT 375  342 401* 324 340    Cardiac Enzymes: No results for input(s): CKTOTAL, CKMB, CKMBINDEX, TROPONINI in the last 168 hours.  BNP: Invalid input(s): POCBNP  CBG: Recent Labs  Lab 05/04/19 0740 05/04/19 1747 05/04/19 2142 05/05/19 0746 05/05/19 1144  GLUCAP 116* 266* 72 202* 150*    Microbiology: Results for orders placed or performed during the hospital encounter of 04/29/19  SARS Coronavirus 2 [facility] order, Performed in [facility] hospital lab) Nasopharyngeal Nasopharyngeal Swab     Status: None   Collection Time: 04/29/19  6:03 PM   Specimen: Nasopharyngeal Swab  Result Value Ref Range Status   SARS Coronavirus 2 NEGATIVE NEGATIVE Final    Comment: (NOTE) If result is NEGATIVE SARS-CoV-2 target nucleic acids are NOT DETECTED. The SARS-CoV-2 RNA is generally detectable in upper and lower  respiratory specimens during the acute phase of infection. The lowest  concentration of SARS-CoV-2 viral copies this assay can detect is 250  copies / mL. A negative result does not preclude SARS-CoV-2 infection  and should not be used as the sole basis for treatment or other  patient management decisions.  A negative result may occur with  improper specimen collection / handling,  submission of specimen other  than nasopharyngeal swab, presence of viral mutation(s) within the  areas targeted by this assay, and inadequate number of viral copies  (<250 copies / mL). A negative result must be combined with clinical  observations, patient history, and epidemiological information. If result is POSITIVE SARS-CoV-2 target nucleic acids are DETECTED. The SARS-CoV-2 RNA is generally detectable in upper and lower  respiratory specimens dur ing the acute phase of infection.  Positive  results are indicative of active infection with SARS-CoV-2.  Clinical  correlation with patient history and other diagnostic  information is  necessary to determine patient infection status.  Positive results do  not rule out bacterial infection or co-infection with other viruses. If result is PRESUMPTIVE POSTIVE SARS-CoV-2 nucleic acids MAY BE PRESENT.   A presumptive positive result was obtained on the submitted specimen  and confirmed on repeat testing.  While 2019 novel coronavirus  (SARS-CoV-2) nucleic acids may be present in the submitted sample  additional confirmatory testing may be necessary for epidemiological  and / or clinical management purposes  to differentiate between  SARS-CoV-2 and other Sarbecovirus currently known to infect humans.  If clinically indicated additional testing with an alternate test  methodology [phone]) is advised. The SARS-CoV-2 RNA is generally  detectable in upper and lower respiratory sp ecimens during the acute  phase of infection. The expected result is Negative. Fact Sheet for Patients:  StrictlyIdeas.no Fact Sheet for Healthcare Providers: BankingDealers.co.za This test is not yet approved or cleared by the [place] FDA and has been authorized for detection and/or diagnosis of SARS-CoV-2 by FDA under an Emergency Use Authorization (EUA).  This EUA will remain in effect (meaning this test can be used) for the duration of the COVID-19 declaration under Section 564(b)(1) of the Act, 21 U.S.C. section 360bbb-3(b)(1), unless the authorization is terminated or revoked sooner. Performed at [facility], [address] [place], [place] [address]   Bordetella pertussis PCR     Status: None   Collection Time: 04/29/19 11:27 PM   Specimen: Nasopharyngeal Swab  Result Value Ref Range Status   B pertussis, DNA Negative Negative Final   B parapertussis, DNA Negative Negative Final    Comment: (NOTE) This test was developed and its performance characteristics determined by [employer]. It has not been  cleared or approved by the U.S. Food and Drug Administration. The FDA has determined that such clearance or approval is not necessary. This test is used for clinical purposes. It should not be regarded as investigational or research. Performed At: [facility] [place], [place] [id number] Rush Farmer MD [id number]     Coagulation Studies: No results for input(s): LABPROT, INR in the last 72 hours.  Urinalysis: No results for input(s): COLORURINE, LABSPEC, PHURINE, GLUCOSEU, HGBUR, BILIRUBINUR, KETONESUR, PROTEINUR, UROBILINOGEN, NITRITE, LEUKOCYTESUR in the last 72 hours.  Invalid input(s): APPERANCEUR    Imaging: Ct Head Wo Contrast  Result Date: 05/03/2019 CLINICAL DATA:  [place] 2 days ago with headache and vomiting since then. EXAM: CT HEAD WITHOUT CONTRAST TECHNIQUE: Contiguous axial images were obtained from the base of the skull through the vertex without intravenous contrast. COMPARISON:  04/29/2019 FINDINGS: Brain: Mild generalized volume loss. Chronic small-vessel ischemic changes of the white matter. 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 No sign of recent infarction, mass lesion, hemorrhage, hydrocephalus or extra-axial collection. Vascular: There is atherosclerotic calcification of the major vessels at the base of the brain. Skull: Negative  Sinuses/Orbits: Clear/normal Other: None IMPRESSION: No acute or traumatic finding. Extensive vascular calcification of the major vessels at the base of the brain. Chronic small-vessel ischemic changes of the white matter. 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 Electronically Signed   By: Nelson Chimes M.D.   On: 05/03/2019 18:45   Dg Chest Port 1 View  Result Date: 05/03/2019 CLINICAL DATA:  58 year old male with syncope. EXAM: PORTABLE CHEST 1 VIEW COMPARISON:  Chest radiograph dated 04/30/2019 FINDINGS: Left lung base  opacity likely combination of a small pleural effusion and associated atelectasis although infiltrate is not excluded. Clinical correlation is recommended. Bilateral streaky densities may represent atelectasis or mild congestion. There is no pneumothorax. Stable cardiomegaly. Atherosclerotic calcification of the aorta. No acute osseous pathology. IMPRESSION: 1. Probable small left pleural effusion and left lung base atelectasis or infiltrate. 2. Cardiomegaly with possible mild vascular congestion. Electronically Signed   By: Anner Crete M.D.   On: 05/03/2019 21:06     Medications:    . amiodarone  200 mg Oral BID  . benzonatate  200 mg Oral TID  . calcium acetate  667 mg Oral TID WC  . Chlorhexidine Gluconate Cloth  6 each Topical Daily  . cinacalcet  30 mg Oral 3 times weekly  . epoetin (EPOGEN/PROCRIT) injection  4,000 Units Intravenous Q M,W,F-HD  . fluticasone  2 spray Each Nare Daily  . heparin  5,000 Units Subcutaneous Q8H  . insulin aspart  0-5 Units Subcutaneous QHS  . insulin aspart  0-9 Units Subcutaneous TID WC  . living well with diabetes book   Does not apply Once  . pantoprazole  40 mg Oral Daily  . polyvinyl alcohol  1 drop Right Eye TID  . sevelamer carbonate  1,600 mg Oral TID WC  . umeclidinium-vilanterol  1 puff Inhalation Daily   acetaminophen **OR** acetaminophen, albuterol, cyclobenzaprine, diphenhydrAMINE, gabapentin, guaiFENesin-codeine, ipratropium-albuterol, morphine injection, ondansetron **OR** ondansetron (ZOFRAN) IV, oxyCODONE, polyethylene glycol  Assessment/ Plan:  [name] is a 58 y.o. black male with end stage renal disease on hemodialysis, hypertension, congestive heart failure, diabetes mellitus type II, history of substance abuse, COPD with ongoing tobacco use  [facility] MWF  180 minutes   1. End stage renal disease with hyperkalemia: HD tomorrow UF goal to 64 kg  2. Syncope: diagnosed as post-tussive syncope at [facility].    Hypotensive on this admission.  Atrial fibrillation with rapid ventricular response treated with amiodarone.   - cardiology team following - no anticoagulation per cardiology.   3. Anemia of chronic kidney disease:  -  Lab Results  Component Value Date   HGB 9.9 (L) 05/04/2019   - EPO on MWF   4. Secondary Hyperparathyroidism:  Lab Results  Component Value Date   PTH 708 (H) 11/24/2018   CALCIUM 8.3 (L) 05/04/2019   PHOS 6.3 (H) 11/24/2018   - Calcium acetate with meals.  - Cinacalcet three times a week  5. Hyperglycemia/Diabetes Lab Results  Component Value Date   HGBA1C 7.6 (H) 05/02/2019  patient believes his Blood sugars are high only because of steroids.  Advised to follow low carb diet    LOS: 5 Arlisha Patalano 8/27/202011:48 AM

## 2019-05-05 NOTE — Plan of Care (Signed)
  Problem: Health Behavior/Discharge Planning: Goal: Ability to manage health-related needs will improve Outcome: Progressing   Problem: Clinical Measurements: Goal: Ability to maintain clinical measurements within normal limits will improve Outcome: Progressing   Problem: Clinical Measurements: Goal: Will remain free from infection Outcome: Progressing   Problem: Clinical Measurements: Goal: Diagnostic test results will improve Outcome: Progressing   Problem: Clinical Measurements: Goal: Respiratory complications will improve Outcome: Progressing   Problem: Clinical Measurements: Goal: Cardiovascular complication will be avoided Outcome: Progressing   Problem: Skin Integrity: Goal: Risk for impaired skin integrity will decrease Outcome: Progressing   Problem: Safety: Goal: Ability to remain free from injury will improve Outcome: Progressing

## 2019-05-06 ENCOUNTER — Inpatient Hospital Stay
Admit: 2019-05-06 | Discharge: 2019-05-06 | Disposition: A | Discharge status: Home / Self Care | Payer: Medicare Other | Attending: Internal Medicine | Admitting: Internal Medicine

## 2019-05-06 LAB — BASIC METABOLIC PANEL
Anion gap: 15 (ref 5–15)
BUN: 65 mg/dL — ABNORMAL HIGH (ref 6–20)
CO2: 25 mmol/L (ref 22–32)
Calcium: 8.7 mg/dL — ABNORMAL LOW (ref 8.9–10.3)
Chloride: 96 mmol/L — ABNORMAL LOW (ref 98–111)
Creatinine, Ser: 9.16 mg/dL — ABNORMAL HIGH (ref 0.61–1.24)
GFR calc Af Amer: 7 mL/min — ABNORMAL LOW (ref >60)
GFR calc non Af Amer: 6 mL/min — ABNORMAL LOW (ref >60)
Glucose, Bld: 167 mg/dL — ABNORMAL HIGH (ref 70–99)
Potassium: 5.1 mmol/L (ref 3.5–5.1)
Sodium: 136 mmol/L (ref 135–145)

## 2019-05-06 LAB — ECHOCARDIOGRAM COMPLETE
Height: 66 in
Weight: 2197.55 oz

## 2019-05-06 MED ORDER — LEVOFLOXACIN 500 MG PO TABS
500.0000 mg | ORAL_TABLET | ORAL | Status: AC
  Administered 2019-05-08 – 2019-05-12 (×3): 500 mg via ORAL
  Filled 2019-05-06 (×3): qty 1

## 2019-05-06 MED ORDER — LEVOFLOXACIN 750 MG PO TABS
750.0000 mg | ORAL_TABLET | Freq: Once | ORAL | Status: AC
  Administered 2019-05-06: 18:00:00 750 mg via ORAL
  Filled 2019-05-06: qty 1

## 2019-05-06 NOTE — Progress Notes (Signed)
*  PRELIMINARY RESULTS* Echocardiogram 2D Echocardiogram has been performed.  Jeremy [name] 05/06/2019, 2:36 PM

## 2019-05-06 NOTE — Progress Notes (Signed)
Pre HD Assessment    05/06/19 0830  Neurological  Level of Consciousness Alert  Orientation Level Oriented X4  Respiratory  Respiratory Pattern Regular;Unlabored  Chest Assessment Chest expansion symmetrical  Bilateral Breath Sounds Clear;Diminished  Cough Non-productive  Cardiac  Pulse Regular  Heart Sounds S1, S2  Jugular Venous Distention (JVD) Yes  Vascular  R Radial Pulse +2  L Radial Pulse +2  Edema Generalized  Psychosocial  Psychosocial (WDL) WDL  Patient Behaviors Cooperative  Emotional support given Given to patient

## 2019-05-06 NOTE — Progress Notes (Signed)
HD Tx started    05/06/19 0838  Vital Signs  Pulse Rate (!) 36  Pulse Rate Source Monitor  Resp 16  BP 131/73  BP Location Right Arm  BP Method Automatic  Patient Position (if appropriate) Lying  Oxygen Therapy  SpO2 95 %  O2 Device Nasal Cannula  O2 Flow Rate (L/min) 1 L/min  Pulse Oximetry Type Continuous  During Hemodialysis Assessment  Blood Flow Rate (mL/min) 400 mL/min  Arterial Pressure (mmHg) -150 mmHg  Venous Pressure (mmHg) 200 mmHg  Transmembrane Pressure (mmHg) 70 mmHg  Ultrafiltration Rate (mL/min) 1500 mL/min  Dialysate Flow Rate (mL/min) 600 ml/min  Conductivity: Machine  14.2  HD Safety Checks Performed Yes  Dialysis Fluid Bolus Normal Saline  Bolus Amount (mL) 250 mL  Intra-Hemodialysis Comments Tx initiated  Fistula / Graft Left Upper arm  Placement Date: 05/17/18   Placed prior to admission: Yes  Orientation: Left  Access Location: Upper arm  Status Accessed  Needle Size 15 g  Drainage Description None

## 2019-05-06 NOTE — Progress Notes (Signed)
Jeremy [name] at [place] NAME: Jeremy [name]    MR#:  [id number]  DATE OF BIRTH:  1961/07/16  SUBJECTIVE:  CHIEF COMPLAINT:   Chief Complaint  Patient presents with  . Near Syncope   Continue to complain of cough intermittently and shortness of breath.  REVIEW OF SYSTEMS:  Review of Systems  Constitutional: Positive for malaise/fatigue. Negative for chills and fever.  HENT: Negative for sore throat.   Eyes: Negative for blurred vision and double vision.  Respiratory: Positive for cough and shortness of breath. Negative for hemoptysis, sputum production, wheezing and stridor.   Cardiovascular: Negative for chest pain, palpitations, orthopnea and leg swelling.  Gastrointestinal: Negative for abdominal pain, blood in stool, diarrhea, melena, nausea and vomiting.  Genitourinary: Negative for dysuria, flank pain and hematuria.  Musculoskeletal: Negative for back pain, joint pain and neck pain.  Skin: Negative for itching and rash.  Neurological: Negative for dizziness, sensory change, focal weakness, seizures, loss of consciousness, weakness and headaches.  Endo/Heme/Allergies: Negative for polydipsia.  Psychiatric/Behavioral: Negative for depression. The patient is not nervous/anxious.     DRUG ALLERGIES:  No Known Allergies VITALS:  Blood pressure 134/77, pulse 86, temperature 98.4 F (36.9 C), temperature source Oral, resp. rate 16, height 5\' 6"  (1.676 m), weight 62.3 kg, SpO2 97 %. PHYSICAL EXAMINATION:  Physical Exam HENT:     Head: Normocephalic.     Mouth/Throat:     Mouth: Mucous membranes are moist.  Eyes:     General: No scleral icterus.    Conjunctiva/sclera: Conjunctivae normal.     Pupils: Pupils are equal, round, and reactive to light.  Neck:     Musculoskeletal: Normal range of motion and neck supple.     Vascular: No JVD.     Trachea: No tracheal deviation.     Comments: Positive JVD. Cardiovascular:     Rate and  Rhythm: Normal rate. Rhythm irregular.     Heart sounds: Normal heart sounds. No murmur. No gallop.   Pulmonary:     Effort: Pulmonary effort is normal. No respiratory distress.     Breath sounds: No stridor. Rales present. No wheezing or rhonchi.  Abdominal:     General: Bowel sounds are normal. There is no distension.     Palpations: Abdomen is soft.     Tenderness: There is no abdominal tenderness. There is no rebound.  Musculoskeletal: Normal range of motion.        General: No tenderness.     Right lower leg: No edema.     Left lower leg: No edema.  Skin:    Findings: No erythema or rash.  Neurological:     General: No focal deficit present.     Mental Status: He is alert and oriented to person, place, and time.     Cranial Nerves: No cranial nerve deficit.  Psychiatric:        Mood and Affect: Mood normal.    LABORATORY PANEL:  Male CBC Recent Labs  Lab 05/04/19 0559  WBC 9.5  HGB 9.9*  HCT 32.3*  PLT 340   ------------------------------------------------------------------------------------------------------------------ Chemistries  Recent Labs  Lab 05/06/19 0645  NA 136  K 5.1  CL 96*  CO2 25  GLUCOSE 167*  BUN 65*  CREATININE 9.16*  CALCIUM 8.7*   RADIOLOGY:  No results found. ASSESSMENT AND PLAN:   Syncope- likely cough syncope, given that he passes out every time he has a severe coughing episode.  Recently  hospitalized at [facility] from 8/13-8/15 and had an unremarkable syncope work-up. Continue telemetry monitor.  A. fib with RVR.  Heart rate is controlled. Amiodarone 150 mg IV bolus and started amiodarone drip per Dr. Ubaldo Glassing. Converted to p.o. amiodarone per Dr. Ubaldo Glassing.   Chronic cough- CHF or COPD. Follows with [facility] for his COPD. Recent RHC 03/30/19 showed very elevated filling pressures. -CXR with left basilar atelectasis vs infiltrate -Continue antitussives -Continue home inhalers -Duonebs prn. -As his complaint persist we will repeat  the chest x-ray.  Elevated troponin- likely demand ischemia.  Patient denies any active chest pain.  EKG with new T wave inversions in the inferior leads.  Troponins are stable.  Left shoulder pain- occurred after a fall. -Left shoulder x-ray: No fracture. -Gabapentin, flexeril, and tylenol prn  ESRD on HD MWF -Continue Sensipar and Phoslo, continue with hemodialysis as scheduled per Dr. Candiss Norse.  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 Chest x-ray report cardiomegaly with pulmonary vascular redistribution and mild interstitial edema. Delene Loll was discontinued due to hyperkalemia.  Continue hemodialysis. Try to wean off oxygen.  Hyperkalemia.  Potassium was 6.0.  Given Lokelma and hemodialysis, improved.  Anemia of chronic kidney disease- hemoglobin at baseline  Tobacco use Smoking cessation was counseled for 3 to 4 minutes, nicotine patch prn Fall.  Fall precaution.  New diagnosis of diabetes type 2.  Hemoglobin A1c 9.1. On sliding scale.  Discussed with Dr. Candiss Norse. All the records are reviewed and case discussed with Care Management/Social Worker. Management plans discussed with the patient, his sister and they are in agreement.  CODE STATUS: Full Code  TOTAL TIME TAKING CARE OF THIS PATIENT: 32 minutes.   More than 50% of the time was spent in counseling/coordination of care: YES  POSSIBLE D/C IN 2 DAYS, DEPENDING ON CLINICAL CONDITION.   Vaughan Basta M.D on 05/06/2019 at 6:10 PM  Between 7am to 6pm - Pager - [phone]  After 6pm go to [email or web address] - [facility]

## 2019-05-06 NOTE — Progress Notes (Signed)
HD Tx completed tolerated well   05/06/19 1145  Vital Signs  Pulse Rate 89  Pulse Rate Source Monitor  Resp 16  BP 139/74  BP Location Right Arm  BP Method Automatic  Patient Position (if appropriate) Lying  Oxygen Therapy  SpO2 99 %  O2 Device Nasal Cannula  O2 Flow Rate (L/min) 1 L/min  Pulse Oximetry Type Continuous  During Hemodialysis Assessment  HD Safety Checks Performed Yes  KECN 62.3 KECN  Dialysis Fluid Bolus Normal Saline  Bolus Amount (mL) 250 mL  Intra-Hemodialysis Comments Tx completed;Tolerated well

## 2019-05-06 NOTE — Progress Notes (Signed)
HD Post Assessment    05/06/19 1207  Neurological  Level of Consciousness Alert  Orientation Level Oriented X4  Respiratory  Respiratory Pattern Regular;Unlabored  Chest Assessment Chest expansion symmetrical  Bilateral Breath Sounds Clear;Diminished  Cough Non-productive  Cardiac  Pulse Regular  Heart Sounds S1, S2  Jugular Venous Distention (JVD) No  ECG Monitor  (Pt refused EKG lead placement, MD aware )  Vascular  R Radial Pulse +2  L Radial Pulse +2  Edema Generalized  Psychosocial  Psychosocial (WDL) WDL  Patient Behaviors Cooperative  Emotional support given Given to patient

## 2019-05-06 NOTE — Progress Notes (Signed)
Late Entry 1751: Notify Dr. Judd Gaudier about patient's refusing his CBG to be check and to receive insulin. He say that he does not have diabetes and would not want for [place] to check his blood sugar. Asked if ok to discontinue orders, ok to discontinue per MD. RN will continue to monitor.

## 2019-05-06 NOTE — Progress Notes (Signed)
Post HD [facility]    05/06/19 1149  Hand-Off documentation  Report given to (Full Name) Jeremy [name]  Report received from (Full Name) Beatris Ship, RN   Vital Signs  Temp 98.4 F (36.9 C)  Temp Source Oral  Pulse Rate 86  Pulse Rate Source Monitor  Resp 16  BP 134/77  Oxygen Therapy  SpO2 97 %  O2 Device Nasal Cannula  O2 Flow Rate (L/min) 1 L/min  Pulse Oximetry Type Continuous  Pain Assessment  Pain Scale 0-10  Pain Score 0  Dialysis Weight  Weight 62.3 kg  Type of Weight Post-Dialysis  Post-Hemodialysis Assessment  Rinseback Volume (mL) 250 mL  KECN 62.3 V  Dialyzer Clearance Lightly streaked  Duration of HD Treatment -hour(s) 3 hour(s)  Hemodialysis Intake (mL) 500 mL  UF Total -Machine (mL) 4500 mL  Net UF (mL) 4000 mL  Tolerated HD Treatment Yes  AVG/AVF Arterial Site Held (minutes) 10 minutes  AVG/AVF Venous Site Held (minutes) 5 minutes  Fistula / Graft Left Upper arm  Placement Date: 05/17/18   Placed prior to admission: Yes  Orientation: Left  Access Location: Upper arm  Site Condition No complications  Fistula / Graft Assessment Present;Thrill;Bruit  Status Deaccessed  Drainage Description None

## 2019-05-06 NOTE — Progress Notes (Signed)
[facility]  ROUNDING NOTE   Subjective:     Continues to report cough although it appears to be less oftern No edema Post HD weight 64 kg last time Still has SOB Requesting challenge UF today   HEMODIALYSIS FLOWSHEET:  Blood Flow Rate (mL/min): 330 mL/min Arterial Pressure (mmHg): -170160 mmHg Venous Pressure (mmHg): 200 mmHg Transmembrane Pressure (mmHg): 70 mmHg Ultrafiltration Rate (mL/min): 1500 mL/min Dialysate Flow Rate (mL/min): 600 ml/min Conductivity: Machine : 14.2 Conductivity: Machine : 14.2 Dialysis Fluid Bolus: Normal Saline Bolus Amount (mL): 250 mL     Objective:  Vital signs in last 24 hours:  Temp:  [97.5 F (36.4 C)-98.9 F (37.2 C)] 98.4 F (36.9 C) (08/28 0835) Pulse Rate:  [36-92] 88 (08/28 1030) Resp:  [16-18] 16 (08/28 0838) BP: (108-145)/(57-75) 117/59 (08/28 1030) SpO2:  [92 %-100 %] 96 % (08/28 1030) Weight:  [65.5 kg] 65.5 kg (08/28 0835)  Weight change: -2 kg Filed Weights   05/04/19 1431 05/06/19 0345 05/06/19 0835  Weight: 64.7 kg 65.5 kg 65.5 kg    Intake/Output: No intake/output data recorded.   Intake/Output this shift:  No intake/output data recorded.  Physical Exam: General: NAD,   Head: Normocephalic, atraumatic. Moist oral mucosal membranes  Eyes: Anicteric   Neck: Supple,    Lungs:  Coarse b/l  Heart: Regular, + pericardial friction rub  Abdomen:  Soft, nontender,   Extremities:  no peripheral edema.  Neurologic: Nonfocal, moving all four extremities  Skin: No lesions  Access: Left AVF    Basic Metabolic Panel: Recent Labs  Lab 05/01/19 0710 05/01/19 1650 05/02/19 0558 05/04/19 0559 05/06/19 0645  NA 135 138 136 139 136  K 6.0* 3.5 4.7 5.1 5.1  CL 97* 97* 97* 100 96*  CO2 19* 27 24 25 25   GLUCOSE 297* 198* 282* 128* 167*  BUN 66* 34* 49* 58* 65*  CREATININE 8.61* 4.53* 7.37* 8.62* 9.16*  CALCIUM 7.8* 8.2* 7.8* 8.3* 8.7*    Liver Function Tests: No results for input(s): AST, ALT,  ALKPHOS, BILITOT, PROT, ALBUMIN in the last 168 hours. No results for input(s): LIPASE, AMYLASE in the last 168 hours. No results for input(s): AMMONIA in the last 168 hours.  CBC: Recent Labs  Lab 04/29/19 1522 04/30/19 0559 05/02/19 0900 05/04/19 0559  WBC 7.6  7.6 10.5 8.2 9.5  NEUTROABS 6.1  --   --   --   HGB 9.4*  9.4* 10.0* 9.1* 9.9*  HCT 29.5*  29.6* 32.4* 29.5* 32.3*  MCV 95.5  95.2 97.0 98.3 97.9  PLT 375  342 401* 324 340    Cardiac Enzymes: No results for input(s): CKTOTAL, CKMB, CKMBINDEX, TROPONINI in the last 168 hours.  BNP: Invalid input(s): POCBNP  CBG: Recent Labs  Lab 05/04/19 0740 05/04/19 1747 05/04/19 2142 05/05/19 0746 05/05/19 1144  GLUCAP 116* 266* 72 202* 150*    Microbiology: Results for orders placed or performed during the hospital encounter of 04/29/19  SARS Coronavirus 2 [facility] order, Performed in [facility] hospital lab) Nasopharyngeal Nasopharyngeal Swab     Status: None   Collection Time: 04/29/19  6:03 PM   Specimen: Nasopharyngeal Swab  Result Value Ref Range Status   SARS Coronavirus 2 NEGATIVE NEGATIVE Final    Comment: (NOTE) If result is NEGATIVE SARS-CoV-2 target nucleic acids are NOT DETECTED. The SARS-CoV-2 RNA is generally detectable in upper and lower  respiratory specimens during the acute phase of infection. The lowest  concentration of SARS-CoV-2 viral copies this  assay can detect is 250  copies / mL. A negative result does not preclude SARS-CoV-2 infection  and should not be used as the sole basis for treatment or other  patient management decisions.  A negative result may occur with  improper specimen collection / handling, submission of specimen other  than nasopharyngeal swab, presence of viral mutation(s) within the  areas targeted by this assay, and inadequate number of viral copies  (<250 copies / mL). A negative result must be combined with clinical  observations, patient history, and  epidemiological information. If result is POSITIVE SARS-CoV-2 target nucleic acids are DETECTED. The SARS-CoV-2 RNA is generally detectable in upper and lower  respiratory specimens dur ing the acute phase of infection.  Positive  results are indicative of active infection with SARS-CoV-2.  Clinical  correlation with patient history and other diagnostic information is  necessary to determine patient infection status.  Positive results do  not rule out bacterial infection or co-infection with other viruses. If result is PRESUMPTIVE POSTIVE SARS-CoV-2 nucleic acids MAY BE PRESENT.   A presumptive positive result was obtained on the submitted specimen  and confirmed on repeat testing.  While 2019 novel coronavirus  (SARS-CoV-2) nucleic acids may be present in the submitted sample  additional confirmatory testing may be necessary for epidemiological  and / or clinical management purposes  to differentiate between  SARS-CoV-2 and other Sarbecovirus currently known to infect humans.  If clinically indicated additional testing with an alternate test  methodology [phone]) is advised. The SARS-CoV-2 RNA is generally  detectable in upper and lower respiratory sp ecimens during the acute  phase of infection. The expected result is Negative. Fact Sheet for Patients:  StrictlyIdeas.no Fact Sheet for Healthcare Providers: BankingDealers.co.za This test is not yet approved or cleared by the [place] FDA and has been authorized for detection and/or diagnosis of SARS-CoV-2 by FDA under an Emergency Use Authorization (EUA).  This EUA will remain in effect (meaning this test can be used) for the duration of the COVID-19 declaration under Section 564(b)(1) of the Act, 21 U.S.C. section 360bbb-3(b)(1), unless the authorization is terminated or revoked sooner. Performed at [facility], [address] [place] [address]    Bordetella pertussis PCR     Status: None   Collection Time: 04/29/19 11:27 PM   Specimen: Nasopharyngeal Swab  Result Value Ref Range Status   B pertussis, DNA Negative Negative Final   B parapertussis, DNA Negative Negative Final    Comment: (NOTE) This test was developed and its performance characteristics determined by [employer]. It has not been cleared or approved by the U.S. Food and Drug Administration. The FDA has determined that such clearance or approval is not necessary. This test is used for clinical purposes. It should not be regarded as investigational or research. Performed At: [facility] [place], [place] [id number] Rush Farmer MD [id number]     Coagulation Studies: No results for input(s): LABPROT, INR in the last 72 hours.  Urinalysis: No results for input(s): COLORURINE, LABSPEC, PHURINE, GLUCOSEU, HGBUR, BILIRUBINUR, KETONESUR, PROTEINUR, UROBILINOGEN, NITRITE, LEUKOCYTESUR in the last 72 hours.  Invalid input(s): APPERANCEUR    Imaging: Dg Chest 2 View  Result Date: 05/05/2019 CLINICAL DATA:  Cough and shortness of breath EXAM: CHEST - 2 VIEW COMPARISON:  05/03/2019 prior radiographs FINDINGS: Cardiomegaly and pulmonary vascular congestion again noted. LEFT LOWER lung opacity is unchanged. No pneumothorax or RIGHT pleural effusion. LEFT axillary stent again noted. IMPRESSION: Unchanged appearance  of the chest with cardiomegaly, pulmonary vascular congestion and continued LEFT LOWER lung opacity which may represent airspace disease, atelectasis and/or effusion. Electronically Signed   By: Margarette Canada M.D.   On: 05/05/2019 14:44     Medications:    . amiodarone  200 mg Oral BID  . benzonatate  200 mg Oral TID  . calcium acetate  667 mg Oral TID WC  . Chlorhexidine Gluconate Cloth  6 each Topical Daily  . cinacalcet  30 mg Oral 3 times weekly  . epoetin (EPOGEN/PROCRIT) injection  4,000 Units Intravenous Q  M,W,F-HD  . feeding supplement (NEPRO CARB STEADY)  237 mL Oral BID BM  . fluticasone  2 spray Each Nare Daily  . heparin  5,000 Units Subcutaneous Q8H  . insulin aspart  0-5 Units Subcutaneous QHS  . insulin aspart  0-9 Units Subcutaneous TID WC  . living well with diabetes book   Does not apply Once  . multivitamin  1 tablet Oral QHS  . pantoprazole  40 mg Oral Daily  . polyvinyl alcohol  1 drop Right Eye TID  . umeclidinium-vilanterol  1 puff Inhalation Daily   acetaminophen **OR** acetaminophen, albuterol, cyclobenzaprine, diphenhydrAMINE, gabapentin, guaiFENesin-codeine, ipratropium-albuterol, morphine injection, ondansetron **OR** ondansetron (ZOFRAN) IV, oxyCODONE, polyethylene glycol  Assessment/ Plan:  Jeremy [name] is a 58 y.o. black male with end stage renal disease on hemodialysis, hypertension, congestive heart failure, diabetes mellitus type II, history of substance abuse, COPD with ongoing tobacco use  [facility] MWF  180 minutes   1. End stage renal disease with hyperkalemia: Seen during HD UF goal to 64 kg  2. Syncope: diagnosed as post-tussive syncope at [facility].   Hypotensive on this admission.  Atrial fibrillation with rapid ventricular response treated with amiodarone.   - cardiology team following - no anticoagulation per cardiology.  - cardiomegaly on CXR. 2 D echo to be done today  3. Anemia of chronic kidney disease:  -  Lab Results  Component Value Date   HGB 9.9 (L) 05/04/2019   - EPO on MWF   4. Secondary Hyperparathyroidism:  Lab Results  Component Value Date   PTH 708 (H) 11/24/2018   CALCIUM 8.7 (L) 05/06/2019   PHOS 6.3 (H) 11/24/2018   - Calcium acetate with meals.  - Cinacalcet three times a week - was on sevelamer also at home  5. Hyperglycemia/Diabetes Lab Results  Component Value Date   HGBA1C 7.6 (H) 05/02/2019  patient believes his Blood sugars are high only because of steroids.  Advised to follow low carb  diet    LOS: 6 Ethne Jeon 8/28/202011:24 AM

## 2019-05-06 NOTE — Progress Notes (Signed)
PT Cancellation Note  Patient Details Name: Jeremy [name] MRN: [id number] DOB: 03-31-1961   Cancelled Treatment:    Reason Eval/Treat Not Completed: (Pt unavailable, bedside imaging taking place. PT will follow up as able.)   Lieutenant Diego PT, DPT [phone] PM,05/06/19 [phone]

## 2019-05-06 NOTE — Progress Notes (Signed)
PT Cancellation Note  Patient Details Name: Jeremy [name] MRN: [id number] DOB: February 21, 1961   Cancelled Treatment:    Reason Eval/Treat Not Completed: Other (comment);Patient at procedure or test/unavailable(Per chart pt receiving HD. PT will follow up as able.)   Lieutenant Diego PT, DPT 11:31 AM,05/06/19 [phone]

## 2019-05-06 NOTE — Consult Note (Signed)
Pharmacy Antibiotic Note  Jeremy [name] is a 58 y.o. male admitted on 04/29/2019 with pneumonia.  Pharmacy has been consulted for levofloxacin dosing. Pt on HD.  Plan: Will give levofloxacin 750 mg x 1 followed by 500 mg q48H PO. IV:PO bioavailability is 1:1. Pt is able to take PO meds from chart. QTc is 470, pt has hx of afib. Continue to monitor.   Height: 5\' 6"  (167.6 cm) Weight: 144 lb 6.4 oz (65.5 kg) IBW/kg (Calculated) : 63.8  Temp (24hrs), Avg:98.3 F (36.8 C), Min:97.5 F (36.4 C), Max:98.9 F (37.2 C)  Recent Labs  Lab 04/29/19 1522 04/30/19 0559  05/01/19 0710 05/01/19 1650 05/02/19 0558 05/02/19 0900 05/04/19 0559 05/06/19 0645  WBC 7.6  7.6 10.5  --   --   --   --  8.2 9.5  --   CREATININE 5.83* 7.31*   < > 8.61* 4.53* 7.37*  --  8.62* 9.16*   < > = values in this interval not displayed.    Estimated Creatinine Clearance: 8 mL/min (A) (by C-G formula based on SCr of 9.16 mg/dL (H)).    No Known Allergies  Antimicrobials this admission: 8/28 levofloxacin >>   Dose adjustments this admission: None  Microbiology results: None  Thank you for allowing pharmacy to be a part of this patient's care.  Oswald Hillock, PharmD, BCPS 05/06/2019 11:53 AM

## 2019-05-06 NOTE — Progress Notes (Signed)
[facility] [place] NAME: Jeremy [name]    MR#:  [id number]  DATE OF BIRTH:  [birth year]  SUBJECTIVE:  CHIEF COMPLAINT:   Chief Complaint  Patient presents with  . Near Syncope   Continue to complain of cough intermittently and shortness of breath.  Received hemodialysis.  Still have complaint of shortness of breath.  REVIEW OF SYSTEMS:  Review of Systems  Constitutional: Positive for malaise/fatigue. Negative for chills and fever.  HENT: Negative for sore throat.   Eyes: Negative for blurred vision and double vision.  Respiratory: Positive for cough and shortness of breath. Negative for hemoptysis, sputum production, wheezing and stridor.   Cardiovascular: Negative for chest pain, palpitations, orthopnea and leg swelling.  Gastrointestinal: Negative for abdominal pain, blood in stool, diarrhea, melena, nausea and vomiting.  Genitourinary: Negative for dysuria, flank pain and hematuria.  Musculoskeletal: Negative for back pain, joint pain and neck pain.  Skin: Negative for itching and rash.  Neurological: Negative for dizziness, sensory change, focal weakness, seizures, loss of consciousness, weakness and headaches.  Endo/Heme/Allergies: Negative for polydipsia.  Psychiatric/Behavioral: Negative for depression. The patient is not nervous/anxious.     DRUG ALLERGIES:  No Known Allergies VITALS:  Blood pressure 134/77, pulse 86, temperature 98.4 F (36.9 C), temperature source Oral, resp. rate 16, height 5\' 6"  (1.676 m), weight 62.3 kg, SpO2 97 %. PHYSICAL EXAMINATION:  Physical Exam HENT:     Head: Normocephalic.     Mouth/Throat:     Mouth: Mucous membranes are moist.  Eyes:     General: No scleral icterus.    Conjunctiva/sclera: Conjunctivae normal.     Pupils: Pupils are equal, round, and reactive to light.  Neck:     Musculoskeletal: Normal range of motion and neck supple.     Vascular: No JVD.     Trachea: No tracheal  deviation.     Comments: Positive JVD. Cardiovascular:     Rate and Rhythm: Normal rate. Rhythm irregular.     Heart sounds: Normal heart sounds. No murmur. No gallop.   Pulmonary:     Effort: Pulmonary effort is normal. No respiratory distress.     Breath sounds: No stridor. Rales present. No wheezing or rhonchi.  Abdominal:     General: Bowel sounds are normal. There is no distension.     Palpations: Abdomen is soft.     Tenderness: There is no abdominal tenderness. There is no rebound.  Musculoskeletal: Normal range of motion.        General: No tenderness.     Right lower leg: No edema.     Left lower leg: No edema.  Skin:    Findings: No erythema or rash.  Neurological:     General: No focal deficit present.     Mental Status: He is alert and oriented to person, place, and time.     Cranial Nerves: No cranial nerve deficit.  Psychiatric:        Mood and Affect: Mood normal.    LABORATORY PANEL:  Male CBC Recent Labs  Lab 05/04/19 0559  WBC 9.5  HGB 9.9*  HCT 32.3*  PLT 340   ------------------------------------------------------------------------------------------------------------------ Chemistries  Recent Labs  Lab 05/06/19 0645  NA 136  K 5.1  CL 96*  CO2 25  GLUCOSE 167*  BUN 65*  CREATININE 9.16*  CALCIUM 8.7*   RADIOLOGY:  No results found. ASSESSMENT AND PLAN:   Syncope- likely cough syncope, given that he passes  out every time he has a severe coughing episode.  Recently hospitalized at [facility] from 8/13-8/15 and had an unremarkable syncope work-up. Continue telemetry monitor.  A. fib with RVR.  Heart rate is controlled. Amiodarone 150 mg IV bolus and started amiodarone drip per Dr. Ubaldo Glassing. Converted to p.o. amiodarone per Dr. Ubaldo Glassing.   Chronic cough- CHF or COPD. Follows with [facility] for his COPD. Recent RHC 03/30/19 showed very elevated filling pressures. -CXR with left basilar atelectasis vs infiltrate -Continue antitussives -Continue  home inhalers -Duonebs prn. -As his complaint persist we will repeat the chest x-ray.  Elevated troponin- likely demand ischemia.  Patient denies any active chest pain.  EKG with new T wave inversions in the inferior leads.  Troponins are stable.  Pneumonia As patient continued to have complaint of cough I will add Levaquin to treat pneumonia as seen on chest x-ray.  Left shoulder pain- occurred after a fall. -Left shoulder x-ray: No fracture. -Gabapentin, flexeril, and tylenol prn  ESRD on HD MWF -Continue Sensipar and Phoslo, continue with hemodialysis as scheduled per Dr. Candiss Norse.  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 Chest x-ray report cardiomegaly with pulmonary vascular redistribution and mild interstitial edema. Jeremy [name] was discontinued due to hyperkalemia.  Continue hemodialysis. Try to wean off oxygen. Repeat echocardiogram shows worsening EF with cardiomegaly, awaiting for cardiology inputs.  Hyperkalemia.  Potassium was 6.0.  Given Lokelma and hemodialysis, improved.  Anemia of chronic kidney disease- hemoglobin at baseline  Tobacco use Smoking cessation was counseled for 3 to 4 minutes, nicotine patch prn Fall.  Fall precaution.  New diagnosis of diabetes type 2.  Hemoglobin A1c 9.1. On sliding scale.  Discussed with Dr. Candiss Norse. All the records are reviewed and case discussed with Care Management/Social Worker. Management plans discussed with the patient, his sister and they are in agreement.  CODE STATUS: Full Code  TOTAL TIME TAKING CARE OF THIS PATIENT: 32 minutes.   More than 50% of the time was spent in counseling/coordination of care: YES  POSSIBLE D/C IN 2 DAYS, DEPENDING ON CLINICAL CONDITION.   Vaughan Basta M.D on 05/06/2019 at 6:13 PM  Between 7am to 6pm - Pager - [phone]   After 6pm go to [email or web address] - [facility]

## 2019-05-06 NOTE — Progress Notes (Signed)
PT Cancellation Note  Patient Details Name: Jeremy [name] MRN: [id number] DOB: 08-30-1961   Cancelled Treatment:    Reason Eval/Treat Not Completed: Other (comment)(Pt refused PT at this time due to increased fatigue after HD earlier today. Educated about importance of being up out of bed for at least 1 meal a day. PT will follow up as able.)   Lieutenant Diego PT, DPT 3:00 PM,05/06/19 [phone]

## 2019-05-06 NOTE — Progress Notes (Signed)
Pre HD Tx   05/06/19 0835  Hand-Off documentation  Report given to (Full Name) Beatris Ship, RN   Report received from (Full Name) Frederico Hamman, RN   Vital Signs  Temp 98.4 F (36.9 C)  Temp Source Oral  Pulse Rate 83  Pulse Rate Source Monitor  Resp 16  BP 137/71  BP Location Right Arm  BP Method Automatic  Patient Position (if appropriate) Lying  Oxygen Therapy  SpO2 95 %  O2 Device Nasal Cannula  O2 Flow Rate (L/min) 1 L/min  Pulse Oximetry Type Continuous  Pain Assessment  Pain Scale 0-10  Pain Score 0  Dialysis Weight  Weight 65.5 kg  Type of Weight Pre-Dialysis  Time-Out for Hemodialysis  What Procedure? HD   Pt Identifiers(min of two) First/Last Name;MRN/Account#  Correct Site? Yes  Correct Side? Yes  Correct Procedure? Yes  Consents Verified? Yes  Rad Studies Available? N/A  Safety Precautions Reviewed? Yes  Engineer, civil (consulting) Number 4  Station Number 1  UF/Alarm Test Passed  Conductivity: Meter 14  Conductivity: Machine  14.2  pH 7.2  Reverse Osmosis Main  Normal Saline Lot Number [id number]  Dialyzer Lot Number [id number]  Disposable Set Lot Number [id number]  Machine Temperature 98.6 F (37 C)  Musician and Audible Yes  Blood Lines Intact and Secured Yes  Pre Treatment Patient Checks  Vascular access used during treatment Fistula  Hepatitis B Surface Antigen Results Negative  Date Hepatitis B Surface Antigen Drawn 07/09/18  Isolation Initiated Yes  Hepatitis B Surface Antibody 53  Date Hepatitis B Surface Antibody Drawn 07/09/18  Hemodialysis Consent Verified Yes  Hemodialysis Standing Orders Initiated Yes  ECG (Telemetry) Monitor On Yes  Prime Ordered Normal Saline  Length of  DialysisTreatment -hour(s) 3 Hour(s) (Pt requested 3hr TX, MD aware )  Dialysis Treatment Comments NA 140  Dialyzer Elisio 17H NR  Dialysate 2K;2.5 Ca  Dialysis Anticoagulant None  Dialysate Flow Ordered 600  Blood Flow Rate Ordered 400 mL/min   Ultrafiltration Goal  (Pt challanging fluid removal at 4L , MD Aware )  Dialysis Blood Pressure Support Ordered Normal Saline  Education / Care Plan  Dialysis Education Provided Yes (Education on Fluid Management Provided, Pt verbalized unders)  Documented Education in Care Plan Yes  Fistula / Graft Left Upper arm  Placement Date: 05/17/18   Placed prior to admission: Yes  Orientation: Left  Access Location: Upper arm  Site Condition No complications  Fistula / Graft Assessment Present;Thrill;Bruit  Status Patent  Drainage Description None

## 2019-05-07 MED ORDER — TOBRAMYCIN 0.3 % OP SOLN
1.0000 [drp] | Freq: Four times a day (QID) | OPHTHALMIC | Status: DC
  Administered 2019-05-07 – 2019-05-13 (×19): 1 [drp] via OPHTHALMIC
  Filled 2019-05-07: qty 5

## 2019-05-07 MED ORDER — INSULIN ASPART 100 UNIT/ML ~~LOC~~ SOLN
0.0000 [IU] | Freq: Three times a day (TID) | SUBCUTANEOUS | Status: DC
  Administered 2019-05-08: 12:00:00 2 [IU] via SUBCUTANEOUS
  Administered 2019-05-08: 1 [IU] via SUBCUTANEOUS
  Administered 2019-05-09 – 2019-05-10 (×3): 2 [IU] via SUBCUTANEOUS
  Filled 2019-05-07 (×6): qty 1

## 2019-05-07 NOTE — Progress Notes (Signed)
Jeremy [name]  MRN: [id number]  DOB/AGE: 1961-08-03 58 y.o.  Primary Care Physician:Patient, No Pcp Per  Admit date: 04/29/2019  Chief Complaint:  Chief Complaint  Patient presents with  . Near Syncope    S-Pt presented on  04/29/2019 with  Chief Complaint  Patient presents with  . Near Syncope  .    Pt main comment is " I still have cough but I am not passing out"    Pt today feels better  meds  . amiodarone  200 mg Oral BID  . benzonatate  200 mg Oral TID  . calcium acetate  667 mg Oral TID WC  . Chlorhexidine Gluconate Cloth  6 each Topical Daily  . cinacalcet  30 mg Oral 3 times weekly  . epoetin (EPOGEN/PROCRIT) injection  4,000 Units Intravenous Q M,W,F-HD  . feeding supplement (NEPRO CARB STEADY)  237 mL Oral BID BM  . fluticasone  2 spray Each Nare Daily  . heparin  5,000 Units Subcutaneous Q8H  . [START ON 05/08/2019] levofloxacin  500 mg Oral Q48H  . living well with diabetes book   Does not apply Once  . multivitamin  1 tablet Oral QHS  . pantoprazole  40 mg Oral Daily  . polyvinyl alcohol  1 drop Right Eye TID  . tobramycin  1 drop Both Eyes Q6H  . umeclidinium-vilanterol  1 puff Inhalation Daily         [id number] from the symptoms mentioned above,there are no other symptoms referable to all systems reviewed.  Physical Exam: Vital signs in last 24 hours: Temp:  [97.7 F (36.5 C)-99.5 F (37.5 C)] 97.7 F (36.5 C) (08/29 0545) Pulse Rate:  [49-92] 83 (08/29 0545) Resp:  [16-20] 20 (08/28 2002) BP: (108-158)/(57-83) 118/68 (08/29 0545) SpO2:  [92 %-100 %] 100 % (08/29 0545) Weight:  [62.3 kg] 62.3 kg (08/28 1149) Weight change: 0 kg Last BM Date: 05/04/19  Intake/Output from previous day: 08/28 0701 - 08/29 0700 In: 240 [P.O.:240] Out: 4000  No intake/output data recorded.   Physical Exam: General- pt is awake,alert, oriented to time place and person Resp- No acute REsp distress, minimal Rhonchi CVS- S1S2 irregular in rate and rhythm,  Pericardial rub +  GIT- BS+, soft, NT, ND EXT- NO LE Edema, Cyanosis Access- AVF+  Lab Results: HGb 9.9  BMET Recent Labs    05/06/19 0645  NA 136  K 5.1  CL 96*  CO2 25  GLUCOSE 167*  BUN 65*  CREATININE 9.16*  CALCIUM 8.7*    MICRO Recent Results (from the past 240 hour(s))  SARS Coronavirus 2 [facility] order, Performed in [facility] hospital lab) Nasopharyngeal Nasopharyngeal Swab     Status: None   Collection Time: 04/29/19  6:03 PM   Specimen: Nasopharyngeal Swab  Result Value Ref Range Status   SARS Coronavirus 2 NEGATIVE NEGATIVE Final    Comment: (NOTE) If result is NEGATIVE SARS-CoV-2 target nucleic acids are NOT DETECTED. The SARS-CoV-2 RNA is generally detectable in upper and lower  respiratory specimens during the acute phase of infection. The lowest  concentration of SARS-CoV-2 viral copies this assay can detect is 250  copies / mL. A negative result does not preclude SARS-CoV-2 infection  and should not be used as the sole basis for treatment or other  patient management decisions.  A negative result may occur with  improper specimen collection / handling, submission of specimen other  than nasopharyngeal swab, presence of viral mutation(s) within the  areas targeted by  this assay, and inadequate number of viral copies  (<250 copies / mL). A negative result must be combined with clinical  observations, patient history, and epidemiological information. If result is POSITIVE SARS-CoV-2 target nucleic acids are DETECTED. The SARS-CoV-2 RNA is generally detectable in upper and lower  respiratory specimens dur ing the acute phase of infection.  Positive  results are indicative of active infection with SARS-CoV-2.  Clinical  correlation with patient history and other diagnostic information is  necessary to determine patient infection status.  Positive results do  not rule out bacterial infection or co-infection with other viruses. If result is PRESUMPTIVE  POSTIVE SARS-CoV-2 nucleic acids MAY BE PRESENT.   A presumptive positive result was obtained on the submitted specimen  and confirmed on repeat testing.  While 2019 novel coronavirus  (SARS-CoV-2) nucleic acids may be present in the submitted sample  additional confirmatory testing may be necessary for epidemiological  and / or clinical management purposes  to differentiate between  SARS-CoV-2 and other Sarbecovirus currently known to infect humans.  If clinically indicated additional testing with an alternate test  methodology [phone]) is advised. The SARS-CoV-2 RNA is generally  detectable in upper and lower respiratory sp ecimens during the acute  phase of infection. The expected result is Negative. Fact Sheet for Patients:  StrictlyIdeas.no Fact Sheet for Healthcare Providers: BankingDealers.co.za This test is not yet approved or cleared by the [place] FDA and has been authorized for detection and/or diagnosis of SARS-CoV-2 by FDA under an Emergency Use Authorization (EUA).  This EUA will remain in effect (meaning this test can be used) for the duration of the COVID-19 declaration under Section 564(b)(1) of the Act, 21 U.S.C. section 360bbb-3(b)(1), unless the authorization is terminated or revoked sooner. Performed at [facility], [address] [place], [place] [address]   Bordetella pertussis PCR     Status: None   Collection Time: 04/29/19 11:27 PM   Specimen: Nasopharyngeal Swab  Result Value Ref Range Status   B pertussis, DNA Negative Negative Final   B parapertussis, DNA Negative Negative Final    Comment: (NOTE) This test was developed and its performance characteristics determined by [employer]. It has not been cleared or approved by the U.S. Food and Drug Administration. The FDA has determined that such clearance or approval is not necessary. This test is used for clinical purposes. It  should not be regarded as investigational or research. Performed At: [facility] [place], [place] [id number] Rush Farmer MD [id number]       Lab Results  Component Value Date   PTH 708 (H) 11/24/2018   CALCIUM 8.7 (L) 05/06/2019   PHOS 6.3 (H) 11/24/2018               Impression: 1)Renal  ESRD on HD                Pt is on MWF schedule                Pt was last dialyzed yesterday                No need for HD today  2) CVS   Afib               BP stable   3)Anemia HGb at goal (9--11)  4)CKD Mineral-Bone Disorder  Secondary Hyperparathyroidism present . Phosphorus not at goal. On binders  5)CNS-admitted with syncope POst tussive syncope Primary MD following  6)Electrolytes  Normokalemic NOrmonatremic   7)Acid base Co2 at goal  8) CVS  Severe mitral regurgitation    Cardiology and Primary team following          Plan:  Will continue current care Will dialyze on Monday     Govani Radloff S 05/07/2019, 8:51 AM

## 2019-05-07 NOTE — Progress Notes (Signed)
Notify Dr. Vianne Bulls about patient's refusal for CBG and to receive insulin since he say he does not have DM. He's been refusing for 2 days, asked if we can d/c order, per MD to just make a note every time he will refuse. RN will continue to monitor.

## 2019-05-07 NOTE — Progress Notes (Addendum)
[place] [facility] [place] NAME: Jeremy [name]    MR#:  [id number]  DATE OF BIRTH:  August 14, 1961  SUBJECTIVE:  CHIEF COMPLAINT:   Chief Complaint  Patient presents with  . Near Syncope   He is mainly complaining of cough today and focused on getting antibiotics for cough.  Patient is already on Levaquin every 48 hours.  REVIEW OF SYSTEMS:  Review of Systems  Constitutional: Positive for malaise/fatigue. Negative for chills and fever.  HENT: Negative for sore throat.   Eyes: Negative for blurred vision and double vision.  Respiratory: Positive for cough and shortness of breath. Negative for hemoptysis, sputum production, wheezing and stridor.   Cardiovascular: Negative for chest pain, palpitations, orthopnea and leg swelling.  Gastrointestinal: Negative for abdominal pain, blood in stool, diarrhea, melena, nausea and vomiting.  Genitourinary: Negative for dysuria, flank pain and hematuria.  Musculoskeletal: Negative for back pain, joint pain and neck pain.  Skin: Negative for itching and rash.  Neurological: Negative for dizziness, sensory change, focal weakness, seizures, loss of consciousness, weakness and headaches.  Endo/Heme/Allergies: Negative for polydipsia.  Psychiatric/Behavioral: Negative for depression. The patient is not nervous/anxious.     DRUG ALLERGIES:  No Known Allergies VITALS:  Blood pressure (!) 120/57, pulse 82, temperature 98.3 F (36.8 C), temperature source Oral, resp. rate (!) 21, height 5\' 6"  (1.676 m), weight 62.3 kg, SpO2 95 %. PHYSICAL EXAMINATION:  Physical Exam HENT:     Head: Normocephalic.     Mouth/Throat:     Mouth: Mucous membranes are moist.  Eyes:     General: No scleral icterus.    Conjunctiva/sclera: Conjunctivae normal.     Pupils: Pupils are equal, round, and reactive to light.  Neck:     Musculoskeletal: Normal range of motion and neck supple.     Vascular: No JVD.     Trachea: No tracheal  deviation.     Comments: Positive JVD. Cardiovascular:     Rate and Rhythm: Normal rate. Rhythm irregular.     Heart sounds: Normal heart sounds. No murmur. No gallop.   Pulmonary:     Effort: Pulmonary effort is normal. No respiratory distress.     Breath sounds: No stridor. Rales present. No wheezing or rhonchi.  Abdominal:     General: Bowel sounds are normal. There is no distension.     Palpations: Abdomen is soft.     Tenderness: There is no abdominal tenderness. There is no rebound.  Musculoskeletal: Normal range of motion.        General: No tenderness.     Right lower leg: No edema.     Left lower leg: No edema.  Skin:    Findings: No erythema or rash.  Neurological:     General: No focal deficit present.     Mental Status: He is alert and oriented to person, place, and time.     Cranial Nerves: No cranial nerve deficit.  Psychiatric:        Mood and Affect: Mood normal.    LABORATORY PANEL:  Male CBC Recent Labs  Lab 05/04/19 0559  WBC 9.5  HGB 9.9*  HCT 32.3*  PLT 340   ------------------------------------------------------------------------------------------------------------------ Chemistries  Recent Labs  Lab 05/06/19 0645  NA 136  K 5.1  CL 96*  CO2 25  GLUCOSE 167*  BUN 65*  CREATININE 9.16*  CALCIUM 8.7*   RADIOLOGY:  No results found. ASSESSMENT AND PLAN:   Syncope- likely cough syncope,  given that he passes out every time he has a severe coughing episode.  Recently hospitalized at [facility] from 8/13-8/15 and had an unremarkable syncope work-up. Continue telemetry monitor.  A. fib with RVR.  Heart rate is controlled. Amiodarone 150 mg IV bolus and started amiodarone drip per Dr. Ubaldo Glassing. Converted to p.o. amiodarone per Dr. Ubaldo Glassing.   Chronic cough- CHF or COPD. Follows with [facility] for his COPD. Recent RHC 03/30/19 showed very elevated filling pressures. -CXR with left basilar atelectasis vs infiltrate -Continue antitussives -Continue  home inhalers -Duonebs prn.  -Elevated troponin- likely demand ischemia.  Patient denies any active chest pain.  EKG with new T wave inversions in the inferior leads.  Troponins are stable.  Pneumonia Continue Levaquin 500 MG every 48 hours, received 750 mg 1 dose yesterday, will get 500 mg tomorrow and every 48 hours after.  Repeat chest x-ray tomorrow.    Left shoulder pain- occurred after a fall. -Left shoulder x-ray: No fracture. -Gabapentin, flexeril, and tylenol prn  ESRD on HD MWF -Continue Sensipar and Phoslo, continue with hemodialysis as scheduled per Dr. Candiss Norse.  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 Chest x-ray report cardiomegaly with pulmonary vascular redistribution and mild interstitial edema. Delene Loll was discontinued due to hyperkalemia.  Continue hemodialysis. Try to wean off oxygen. Repeat echocardiogram shows worsening EF with cardiomegaly, awaiting for cardiology inputs.  Hyperkalemia.  Potassium was 6.0.  Given Lokelma and hemodialysis, improved.  5.1 today,  .Anemia of chronic kidney disease- hemoglobin at baseline  Tobacco use Smoking cessation was counseled for 3 to 4 minutes, nicotine patch prn   Fall.  Fall precaution.  Patient had a fall in the hospital a week ago and states that he had a concussion and had nausea for 2 days and he feels like he has trouble remembering details, patient ex-wife called and was on hold he was talking to the patient she could also hear about I said to him.   . All the records are reviewed and case discussed with Care Management/Social Worker. Management plans discussed with the patient, his sister and they are in agreement.  CODE STATUS: Full Code  TOTAL TIME TAKING CARE OF THIS PATIENT: 32 minutes.   More than 50% of the time was spent in  counseling/coordination of care: YES  POSSIBLE D/C IN 2 DAYS, DEPENDING ON CLINICAL CONDITION.   Epifanio Lesches M.D on 05/07/2019 at 11:50 AM  Between 7am to 6pm - Pager - [phone]  After 6pm go to [email or web address] - [facility]

## 2019-05-08 LAB — GLUCOSE, CAPILLARY
Glucose-Capillary: 142 mg/dL — ABNORMAL HIGH (ref 70–99)
Glucose-Capillary: 161 mg/dL — ABNORMAL HIGH (ref 70–99)
Glucose-Capillary: 118 mg/dL — ABNORMAL HIGH (ref 70–99)

## 2019-05-08 MED ORDER — BENZONATATE 100 MG PO CAPS
200.0000 mg | ORAL_CAPSULE | Freq: Once | ORAL | Status: AC
  Administered 2019-05-08: 200 mg via ORAL

## 2019-05-08 NOTE — Progress Notes (Signed)
Jeremy [name] has declined bedtime blood sugar check as well as SQ heparin and CHG bath. Wipes left at bedside for his later use if he changes his mind.

## 2019-05-08 NOTE — Progress Notes (Signed)
Jeremy [name]  MRN: [id number]  DOB/AGE: May 30, 1961 58 y.o.  Primary Care Physician:Patient, No Pcp Per  Admit date: 04/29/2019  Chief Complaint:  Chief Complaint  Patient presents with  . Near Syncope    [name]-Pt presented on  04/29/2019 with  Chief Complaint  Patient presents with  . Near Syncope  .     Pt offers no new complaints.Pt main comment was " I need you to remove 4 liters in 3 hours "   I later had extensive discussion with pt about ned to stay for full treatment/ risks benefits of signing out early/limits of safe fluid removal." Pt voiced understanding   meds  . amiodarone  200 mg Oral BID  . benzonatate  200 mg Oral TID  . calcium acetate  667 mg Oral TID WC  . Chlorhexidine Gluconate Cloth  6 each Topical Daily  . cinacalcet  30 mg Oral 3 times weekly  . epoetin (EPOGEN/PROCRIT) injection  4,000 Units Intravenous Q M,W,F-HD  . feeding supplement (NEPRO CARB STEADY)  237 mL Oral BID BM  . fluticasone  2 spray Each Nare Daily  . heparin  5,000 Units Subcutaneous Q8H  . insulin aspart  0-9 Units Subcutaneous TID WC  . levofloxacin  500 mg Oral Q48H  . living well with diabetes book   Does not apply Once  . multivitamin  1 tablet Oral QHS  . pantoprazole  40 mg Oral Daily  . polyvinyl alcohol  1 drop Right Eye TID  . tobramycin  1 drop Both Eyes Q6H  . umeclidinium-vilanterol  1 puff Inhalation Daily         [id number] from the symptoms mentioned above,there are no other symptoms referable to all systems reviewed.  Physical Exam: Vital signs in last 24 hours: Temp:  [98.4 F (36.9 C)-99 F (37.2 C)] 98.6 F (37 C) (08/30 0847) Pulse Rate:  [82-85] 83 (08/30 0847) Resp:  [18-20] 18 (08/30 0847) BP: (127-140)/(62-73) 140/64 (08/30 0847) SpO2:  [95 %-100 %] 95 % (08/30 0847) Weight:  [65.8 kg] 65.8 kg (08/30 0615) Weight change: 0.272 kg Last BM Date: 05/04/19  Intake/Output from previous day: 08/29 0701 - 08/30 0700 In: 1197 [P.O.:1197] Out: 0  Total  I/O In: 240 [P.O.:240] Out: 0    Physical Exam: General- pt is awake,alert, oriented to time place and person Resp- No acute REsp distress, minimal Rhonchi CVS- S1S2 irregular in rate and rhythm, Pericardial rub +  GIT- BS+, soft, NT, ND EXT- NO LE Edema, Cyanosis Access- AVF+  Lab Results:  CBC    Component Value Date/Time   WBC 9.5 05/04/2019 0559   RBC 3.30 (L) 05/04/2019 0559   HGB 9.9 (L) 05/04/2019 0559   HCT 32.3 (L) 05/04/2019 0559   PLT 340 05/04/2019 0559   MCV 97.9 05/04/2019 0559   MCH 30.0 05/04/2019 0559   MCHC 30.7 05/04/2019 0559   RDW 17.0 (H) 05/04/2019 0559   LYMPHSABS 0.7 04/29/2019 1522   MONOABS 0.6 04/29/2019 1522   EOSABS 0.1 04/29/2019 1522   BASOSABS 0.0 04/29/2019 1522    BMET Recent Labs    05/06/19 0645  NA 136  K 5.1  CL 96*  CO2 25  GLUCOSE 167*  BUN 65*  CREATININE 9.16*  CALCIUM 8.7*    MICRO Recent Results (from the past 240 hour([name]))  SARS Coronavirus 2 [facility] order, Performed in [facility] hospital lab) Nasopharyngeal Nasopharyngeal Swab     Status: None   Collection Time: 04/29/19  6:03  PM   Specimen: Nasopharyngeal Swab  Result Value Ref Range Status   SARS Coronavirus 2 NEGATIVE NEGATIVE Final    Comment: (NOTE) If result is NEGATIVE SARS-CoV-2 target nucleic acids are NOT DETECTED. The SARS-CoV-2 RNA is generally detectable in upper and lower  respiratory specimens during the acute phase of infection. The lowest  concentration of SARS-CoV-2 viral copies this assay can detect is 250  copies / mL. A negative result does not preclude SARS-CoV-2 infection  and should not be used as the sole basis for treatment or other  patient management decisions.  A negative result may occur with  improper specimen collection / handling, submission of specimen other  than nasopharyngeal swab, presence of viral mutation([name]) within the  areas targeted by this assay, and inadequate number of viral copies  (<250 copies / mL). A  negative result must be combined with clinical  observations, patient history, and epidemiological information. If result is POSITIVE SARS-CoV-2 target nucleic acids are DETECTED. The SARS-CoV-2 RNA is generally detectable in upper and lower  respiratory specimens dur ing the acute phase of infection.  Positive  results are indicative of active infection with SARS-CoV-2.  Clinical  correlation with patient history and other diagnostic information is  necessary to determine patient infection status.  Positive results do  not rule out bacterial infection or co-infection with other viruses. If result is PRESUMPTIVE POSTIVE SARS-CoV-2 nucleic acids MAY BE PRESENT.   A presumptive positive result was obtained on the submitted specimen  and confirmed on repeat testing.  While 2019 novel coronavirus  (SARS-CoV-2) nucleic acids may be present in the submitted sample  additional confirmatory testing may be necessary for epidemiological  and / or clinical management purposes  to differentiate between  SARS-CoV-2 and other Sarbecovirus currently known to infect humans.  If clinically indicated additional testing with an alternate test  methodology [phone]) is advised. The SARS-CoV-2 RNA is generally  detectable in upper and lower respiratory sp ecimens during the acute  phase of infection. The expected result is Negative. Fact Sheet for Patients:  StrictlyIdeas.no Fact Sheet for Healthcare Providers: BankingDealers.co.za This test is not yet approved or cleared by the [place] FDA and has been authorized for detection and/or diagnosis of SARS-CoV-2 by FDA under an Emergency Use Authorization (EUA).  This EUA will remain in effect (meaning this test can be used) for the duration of the COVID-19 declaration under Section 564(b)(1) of the Act, 21 [name]C. section 360bbb-3(b)(1), unless the authorization is terminated or revoked sooner. Performed  at [facility], [address] [place], [place] [address]   Bordetella pertussis PCR     Status: None   Collection Time: 04/29/19 11:27 PM   Specimen: Nasopharyngeal Swab  Result Value Ref Range Status   B pertussis, DNA Negative Negative Final   B parapertussis, DNA Negative Negative Final    Comment: (NOTE) This test was developed and its performance characteristics determined by [employer]. It has not been cleared or approved by the [name] Food and Drug Administration. The FDA has determined that such clearance or approval is not necessary. This test is used for clinical purposes. It should not be regarded as investigational or research. Performed At: [facility] [place], [place] [id number] Rush Farmer MD [id number]       Lab Results  Component Value Date   PTH 708 (H) 11/24/2018   CALCIUM 8.7 (L) 05/06/2019   PHOS 6.3 (H) 11/24/2018  Impression: 1)Renal  ESRD on HD                Pt is on MWF schedule                Pt was last dialyzed Friday                No need for HD today  2) CVS   Afib               BP stable   3)Anemia HGb at goal (9--11)  4)CKD Mineral-Bone Disorder  Secondary Hyperparathyroidism present . Phosphorus not at goal. On binders  5)CNS-admitted with syncope Post tussive syncope Primary MD following  6)Electrolytes  Normokalemic NOrmonatremic   7)Acid base Co2 at goal  8) CVS  Severe mitral regurgitation    Cardiology and Primary team following          Plan:  Will continue current care Will dialyze on Monday     Jeremy [name] 05/08/2019, 12:37 PM

## 2019-05-08 NOTE — Progress Notes (Signed)
[facility] at [place] NAME: Jeremy [name]    MR#:  [id number]  DATE OF BIRTH:  11-16-1960  SUBJECTIVE:  CHIEF COMPLAINT:   Chief Complaint  Patient presents with  . Near Syncope   Complains of nausea today.  REVIEW OF SYSTEMS:  Review of Systems  Constitutional: Positive for malaise/fatigue. Negative for chills and fever.  HENT: Negative for sore throat.   Eyes: Negative for blurred vision and double vision.  Respiratory: Positive for cough and shortness of breath. Negative for hemoptysis, sputum production, wheezing and stridor.   Cardiovascular: Negative for chest pain, palpitations, orthopnea and leg swelling.  Gastrointestinal: Negative for abdominal pain, blood in stool, diarrhea, melena, nausea and vomiting.  Genitourinary: Negative for dysuria, flank pain and hematuria.  Musculoskeletal: Negative for back pain, joint pain and neck pain.  Skin: Negative for itching and rash.  Neurological: Negative for dizziness, sensory change, focal weakness, seizures, loss of consciousness, weakness and headaches.  Endo/Heme/Allergies: Negative for polydipsia.  Psychiatric/Behavioral: Negative for depression. The patient is not nervous/anxious.     DRUG ALLERGIES:  No Known Allergies VITALS:  Blood pressure 140/64, pulse 83, temperature 98.6 F (37 C), temperature source Oral, resp. rate 18, height 5\' 6"  (1.676 m), weight 65.8 kg, SpO2 95 %. PHYSICAL EXAMINATION:  Physical Exam HENT:     Head: Normocephalic.     Mouth/Throat:     Mouth: Mucous membranes are moist.  Eyes:     General: No scleral icterus.    Conjunctiva/sclera: Conjunctivae normal.     Pupils: Pupils are equal, round, and reactive to light.  Neck:     Musculoskeletal: Normal range of motion and neck supple.     Vascular: No JVD.     Trachea: No tracheal deviation.     Comments: Positive JVD. Cardiovascular:     Rate and Rhythm: Normal rate. Rhythm irregular.     Heart  sounds: Normal heart sounds. No murmur. No gallop.   Pulmonary:     Effort: Pulmonary effort is normal. No respiratory distress.     Breath sounds: No stridor. Rales present. No wheezing or rhonchi.  Abdominal:     General: Bowel sounds are normal. There is no distension.     Palpations: Abdomen is soft.     Tenderness: There is no abdominal tenderness. There is no rebound.  Musculoskeletal: Normal range of motion.        General: No tenderness.     Right lower leg: No edema.     Left lower leg: No edema.  Skin:    Findings: No erythema or rash.  Neurological:     General: No focal deficit present.     Mental Status: He is alert and oriented to person, place, and time.     Cranial Nerves: No cranial nerve deficit.  Psychiatric:        Mood and Affect: Mood normal.    LABORATORY PANEL:  Male CBC Recent Labs  Lab 05/04/19 0559  WBC 9.5  HGB 9.9*  HCT 32.3*  PLT 340   ------------------------------------------------------------------------------------------------------------------ Chemistries  Recent Labs  Lab 05/06/19 0645  NA 136  K 5.1  CL 96*  CO2 25  GLUCOSE 167*  BUN 65*  CREATININE 9.16*  CALCIUM 8.7*   RADIOLOGY:  No results found. ASSESSMENT AND PLAN:   Syncope- likely cough syncope, given that he passes out every time he has a severe coughing episode.  Recently hospitalized at [facility] from 8/13-8/15 and  had an unremarkable syncope work-up. Continue telemetry monitor.  A. fib with RVR.  Heart rate is controlled. Amiodarone 150 mg IV bolus and started amiodarone drip per Dr. Ubaldo Glassing. Converted to p.o. amiodarone per Dr. Ubaldo Glassing.   Chronic cough- CHF or COPD. Follows with [facility] for his COPD. Recent RHC 03/30/19 showed very elevated filling pressures. -CXR with left basilar atelectasis vs infiltrate, continue Levaquin, repeat chest x-ray tomorrow, patient wants to be chest x-ray.  Continue Levaquin every 48 hours.   -Continue antitussives -Continue  home inhalers -Duonebs prn. Do not have patient's home regimen of cough medicine in the hospital.  I saw documentation from nurse.   -Elevated troponin- likely demand ischemia.  Patient denies any active chest pain.  EKG with new T wave inversions in the inferior leads.  Troponins are stable.  Pneumonia Continue Levaquin 500 MG every 48 hours, received 750 mg 1 dose yesterday, will get 500 mg tomorrow and every 48 hours after.  Repeat chest x-ray tomorrow.    Left shoulder pain- occurred after a fall. -Left shoulder x-ray: No fracture. -Gabapentin, flexeril, and tylenol prn  ESRD on HD MWF -Continue Sensipar and Phoslo, continue with hemodialysis as scheduled per Dr. Candiss Norse.  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 Chest x-ray report cardiomegaly with pulmonary vascular redistribution and mild interstitial edema. Delene Loll was discontinued due to hyperkalemia.  Continue hemodialysis. Try to wean off oxygen. Repeat echocardiogram shows worsening EF with cardiomegaly, awaiting for cardiology inputs.  Hyperkalemia.  Potassium was 6.0.  Given Lokelma and hemodialysis, improved.   .Anemia of chronic kidney disease- hemoglobin at baseline  Tobacco use Smoking cessation was counseled for 3 to 4 minutes, nicotine patch prn   Fall.  Fall precaution.  Patient had a fall in the hospital a week ago and states that he had a concussion and had nausea for 2 days and he feels like he has trouble remembering details, patient ex-wife called and was on hold he was talking to the patient she could also hear about I said to him.   . All the records are reviewed and case discussed with Care Management/Social Worker. Management plans discussed with the patient, his sister and they are in agreement.  CODE STATUS: Full  Code  TOTAL TIME TAKING CARE OF THIS PATIENT: 32 minutes.   More than 50% of the time was spent in counseling/coordination of care: YES  POSSIBLE D/C IN 2 DAYS, DEPENDING ON CLINICAL CONDITION.   Epifanio Lesches M.D on 05/08/2019 at 12:02 PM  Between 7am to 6pm - Pager - [phone]  After 6pm go to [email or web address] - [facility]

## 2019-05-08 NOTE — Progress Notes (Signed)
Patient requesting brankaid for cough stating, "That's what I take at home and it the only thing that really helps me". On call provider phoned and updated. It was determined that we do not carry the requested medication in the hospital. Patient was advised to bring his home medication into the hospital and have it checked into pharmacy so MD can prescribe it and have it administered to patient while he is in the hospital. For now, the provider ordered an additional dose of tessalon pearls.

## 2019-05-09 ENCOUNTER — Inpatient Hospital Stay: Payer: Medicare Other

## 2019-05-09 LAB — GLUCOSE, CAPILLARY
Glucose-Capillary: 168 mg/dL — ABNORMAL HIGH (ref 70–99)
Glucose-Capillary: 86 mg/dL (ref 70–99)
Glucose-Capillary: 172 mg/dL — ABNORMAL HIGH (ref 70–99)

## 2019-05-09 NOTE — Progress Notes (Signed)
HD Tx started Pt refuses ekj monitor on due to systemic skin rash   05/09/19 1155  Hand-Off documentation  Report given to (Full Name) Sueanne Margarita RN   Report received from (Full Name) Richardson Landry  RN   Vital Signs  Temp 97.6 F (36.4 C)  Temp Source Oral  Pulse Rate 70  Pulse Rate Source Monitor  Resp 15  BP 140/64  BP Location Right Arm  BP Method Automatic  Patient Position (if appropriate) Lying  Oxygen Therapy  SpO2 92 %  O2 Device Nasal Cannula  O2 Flow Rate (L/min) 1 L/min  Pain Assessment  Pain Scale 0-10  Pain Score 0  Dialysis Weight  Weight 72.7 kg  Type of Weight Pre-Dialysis  Time-Out for Hemodialysis  What Procedure? HD  Pt Identifiers(min of two) First/Last Name;MRN/Account#  Correct Site? Yes  Correct Side? Yes  Correct Procedure? Yes  Consents Verified? Yes  Engineer, civil (consulting) Number 4  Station Number 1  UF/Alarm Test Passed  Conductivity: Meter 13.8  Conductivity: Machine  14.2  pH 7.4  Reverse Osmosis Main  Normal Saline Lot Number [id number]  Dialyzer Lot Number [id number]  Disposable Set Lot Number [id number]  Machine Temperature 98.6 F (37 C)  Musician and Audible Yes  Blood Lines Intact and Secured Yes  Pre Treatment Patient Checks  Vascular access used during treatment Fistula  HD catheter dressing before treatment WDL  Patient is receiving dialysis in a chair  (in Bed)  Hepatitis B Surface Antigen Results Negative  Date Hepatitis B Surface Antigen Drawn 07/09/18  Hepatitis B Surface Antibody  (>10)  Date Hepatitis B Surface Antibody Drawn 07/09/18  Hemodialysis Consent Verified Yes  Hemodialysis Standing Orders Initiated Yes  ECG (Telemetry) Monitor On No (Pt refused)  Prime Ordered Normal Saline  Length of  DialysisTreatment -hour(s) 3.5 Hour(s)  Dialysis Treatment Comments  (Na140)  Dialyzer Elisio 17H NR  Dialysate 3K;2.5 Ca  Dialysis Anticoagulant None  Dialysate Flow Ordered 600  Blood Flow Rate Ordered 400  mL/min  Ultrafiltration Goal 1000 Liters (as per Physician Verbal Order Dr Holley Raring)  Education / Care Plan  Dialysis Education Provided Yes  Documented Education in Care Plan Yes  Fistula / Graft Left Upper arm  Placement Date: 05/17/18   Placed prior to admission: Yes  Orientation: Left  Access Location: Upper arm  Site Condition No complications  Fistula / Graft Assessment Present;Thrill;Bruit  Status Accessed  Needle Size 15  Drainage Description None

## 2019-05-09 NOTE — Progress Notes (Signed)
Pre HD initiation Tx   05/09/19 1242  Neurological  Level of Consciousness Alert  Orientation Level Oriented X4  Respiratory  Respiratory Pattern Regular;Unlabored  Chest Assessment Chest expansion symmetrical  Bilateral Breath Sounds Clear;Diminished  Cardiac  Pulse Regular  Heart Sounds S1, S2;Murmur  ECG Monitor No (pt refuses EKJ during HD )  Vascular  R Radial Pulse +2  L Radial Pulse +2  Edema Generalized  Generalized Edema Other (Comment) (non pitting)  Integumentary  Integumentary (WDL) X  Skin Color Other (Comment) (systemic rash)  Skin Integrity Rash  Rash Location Abdomen;Arm;Foot (generalized)  Rash Location Orientation Other (Comment) (generalized)  Musculoskeletal  Musculoskeletal (WDL) X  Generalized Weakness Yes  Gastrointestinal  Bowel Sounds Assessment Hypoactive  GU Assessment  Genitourinary (WDL) X  Genitourinary Symptoms Anuria (ESRD)  Psychosocial  Psychosocial (WDL) WDL

## 2019-05-09 NOTE — Plan of Care (Signed)
  Problem: Education: Goal: Knowledge of General Education information will improve Description: Including pain rating scale, medication(s)/side effects and non-pharmacologic comfort measures Outcome: Progressing   Problem: Health Behavior/Discharge Planning: Goal: Ability to manage health-related needs will improve Outcome: Not Progressing Note: Patient nauseated and with vomitus this morning. Treated with IV Zofran. Remains in HD department at this time. Will continue to monitor overall condition for the remainder of the shift. Wenda Low [facility]

## 2019-05-09 NOTE — Plan of Care (Signed)

## 2019-05-09 NOTE — Progress Notes (Deleted)
This note also relates to the following rows which could not be included: Pulse Rate - Cannot attach notes to unvalidated device data SpO2 - Cannot attach notes to unvalidated device data Pt refused to have the EKJ Monitor on due to systemic skin rash   05/09/19 1155  Hand-Off documentation  Report given to (Full Name) Sueanne Margarita RN   Report received from (Full Name) Binnie Kand RN   Vital Signs  Temp 97.6 F (36.4 C)  Temp Source Oral  Pulse Rate Source Monitor  Resp 15  BP 140/64  BP Location Right Arm  BP Method Automatic  Patient Position (if appropriate) Lying  Oxygen Therapy  O2 Device Nasal Cannula  O2 Flow Rate (L/min) 1 L/min  Pain Assessment  Pain Scale 0-10  Pain Score 0  Dialysis Weight  Weight 72.7 kg  Type of Weight Pre-Dialysis  Time-Out for Hemodialysis  What Procedure? HD  Pt Identifiers(min of two) First/Last Name;MRN/Account#  Correct Site? Yes  Correct Side? Yes  Correct Procedure? Yes  Consents Verified? Yes  Engineer, civil (consulting) Number 4  Station Number 1  UF/Alarm Test Passed  Conductivity: Meter 13.8  Conductivity: Machine  14.2  pH 7.4  Reverse Osmosis Main  Normal Saline Lot Number [id number]  Dialyzer Lot Number [id number]  Disposable Set Lot Number [id number]  Machine Temperature 98.6 F (37 C)  Musician and Audible Yes  Blood Lines Intact and Secured Yes  Pre Treatment Patient Checks  Vascular access used during treatment Fistula  HD catheter dressing before treatment WDL  Patient is receiving dialysis in a chair  (in Bed)  Hepatitis B Surface Antigen Results Negative  Date Hepatitis B Surface Antigen Drawn 07/09/18  Hepatitis B Surface Antibody  (>10)  Date Hepatitis B Surface Antibody Drawn 07/09/18  Hemodialysis Consent Verified Yes  Hemodialysis Standing Orders Initiated Yes  ECG (Telemetry) Monitor On No (Pt refused)  Prime Ordered Normal Saline  Length of  DialysisTreatment -hour(s) 3.5 Hour(s)  Dialysis  Treatment Comments  (Na140)  Dialyzer Elisio 17H NR  Dialysate 3K;2.5 Ca  Dialysis Anticoagulant None  Dialysate Flow Ordered 600  Blood Flow Rate Ordered 400 mL/min  Ultrafiltration Goal 1000 Liters (as per Physician Verbal Order Dr Holley Raring)  Education / Care Plan  Dialysis Education Provided Yes  Documented Education in Care Plan Yes  Fistula / Graft Left Upper arm  Placement Date: 05/17/18   Placed prior to admission: Yes  Orientation: Left  Access Location: Upper arm  Site Condition No complications  Fistula / Graft Assessment Present;Thrill;Bruit  Status Accessed  Needle Size 15  Drainage Description None

## 2019-05-09 NOTE — Consult Note (Addendum)
Pharmacy Antibiotic Note  Jeremy [name] is a 58 y.o. male admitted on 04/29/2019 with pneumonia.  Pharmacy has been consulted for levofloxacin dosing. Pt on HD.  Plan: Will give levofloxacin  500 mg q48H  04/29/19: QTc is 490, pt has hx of afib. Continue to monitor.   Addendum @ [id number]: Discussed during rounds to possibly recheck QTc  Height: 5\' 6"  (167.6 cm) Weight: 146 lb 3.2 oz (66.3 kg) IBW/kg (Calculated) : 63.8  Temp (24hrs), Avg:97.9 F (36.6 C), Min:97.4 F (36.3 C), Max:98.4 F (36.9 C)  Recent Labs  Lab 05/04/19 0559 05/06/19 0645  WBC 9.5  --   CREATININE 8.62* 9.16*    Estimated Creatinine Clearance: 8 mL/min (A) (by C-G formula based on SCr of 9.16 mg/dL (H)).    No Known Allergies  Antimicrobials this admission: 8/28 levofloxacin >>   Dose adjustments this admission: None  Microbiology results: None  Thank you for allowing pharmacy to be a part of this patient's care.  Rowland Lathe, PharmD 05/09/2019 9:57 AM

## 2019-05-09 NOTE — Plan of Care (Signed)
  Problem: Fluid Volume: Goal: Compliance with measures to maintain balanced fluid volume will improve Outcome: Progressing   Problem: Nutritional: Goal: Ability to make healthy dietary choices will improve Outcome: Progressing   Problem: Clinical Measurements: Goal: Complications related to the disease process, condition or treatment will be avoided or minimized Outcome: Progressing

## 2019-05-09 NOTE — Progress Notes (Signed)
Post HD Tx assessment   05/09/19 1430  Neurological  Orientation Level Oriented X4  Respiratory  Respiratory Pattern Regular;Unlabored  Chest Assessment Chest expansion symmetrical  Bilateral Breath Sounds Clear  Cough Non-productive  Sputum Amount None  Cardiac  Pulse Regular  Heart Sounds S1, S2;Murmur  ECG Monitor No (pt refuses EKJ during HD )  Vascular  R Radial Pulse +2  L Radial Pulse +2  Edema Generalized  Generalized Edema Other (Comment) (non pitting)  Integumentary  Integumentary (WDL) X  Skin Color Other (Comment) (systemic rash)  Skin Integrity Rash  Rash Location Abdomen;Arm;Foot (generalized)  Rash Location Orientation Other (Comment) (generalized)  Musculoskeletal  Musculoskeletal (WDL) X  Generalized Weakness Yes  Gastrointestinal  Bowel Sounds Assessment Hypoactive  GU Assessment  Genitourinary (WDL) X  Genitourinary Symptoms Anuria (ESRD)  Psychosocial  Psychosocial (WDL) WDL

## 2019-05-09 NOTE — Progress Notes (Signed)
[facility] at [place] NAME: Jeremy [name]    MR#:  [id number]  DATE OF BIRTH:  1961-02-10  SUBJECTIVE: Less cough, nausea his main complaint today, has dizziness..  CHIEF COMPLAINT:   Chief Complaint  Patient presents with  . Near Syncope   Complains of nausea today.  REVIEW OF SYSTEMS:  Review of Systems  Constitutional: Positive for malaise/fatigue. Negative for chills and fever.  HENT: Negative for sore throat.   Eyes: Negative for blurred vision and double vision.  Respiratory: Positive for cough and shortness of breath. Negative for hemoptysis, sputum production, wheezing and stridor.   Cardiovascular: Negative for chest pain, palpitations, orthopnea and leg swelling.  Gastrointestinal: Negative for abdominal pain, blood in stool, diarrhea, melena, nausea and vomiting.  Genitourinary: Negative for dysuria, flank pain and hematuria.  Musculoskeletal: Negative for back pain, joint pain and neck pain.  Skin: Negative for itching and rash.  Neurological: Negative for dizziness, sensory change, focal weakness, seizures, loss of consciousness, weakness and headaches.  Endo/Heme/Allergies: Negative for polydipsia.  Psychiatric/Behavioral: Negative for depression. The patient is not nervous/anxious.     DRUG ALLERGIES:  No Known Allergies VITALS:  Blood pressure 127/61, pulse 76, temperature 97.6 F (36.4 C), temperature source Oral, resp. rate 18, height 5\' 6"  (1.676 m), weight 72.7 kg, SpO2 97 %. PHYSICAL EXAMINATION:  Physical Exam HENT:     Head: Normocephalic.     Mouth/Throat:     Mouth: Mucous membranes are moist.  Eyes:     General: No scleral icterus.    Conjunctiva/sclera: Conjunctivae normal.     Pupils: Pupils are equal, round, and reactive to light.  Neck:     Musculoskeletal: Normal range of motion and neck supple.     Vascular: No JVD.     Trachea: No tracheal deviation.     Comments: Positive JVD. Cardiovascular:      Rate and Rhythm: Normal rate. Rhythm irregular.     Heart sounds: Normal heart sounds. No murmur. No gallop.   Pulmonary:     Effort: Pulmonary effort is normal. No respiratory distress.     Breath sounds: No stridor. Rales present. No wheezing or rhonchi.  Abdominal:     General: Bowel sounds are normal. There is no distension.     Palpations: Abdomen is soft.     Tenderness: There is no abdominal tenderness. There is no rebound.  Musculoskeletal: Normal range of motion.        General: No tenderness.     Right lower leg: No edema.     Left lower leg: No edema.  Skin:    Findings: No erythema or rash.  Neurological:     General: No focal deficit present.     Mental Status: He is alert and oriented to person, place, and time.     Cranial Nerves: No cranial nerve deficit.  Psychiatric:        Mood and Affect: Mood normal.    LABORATORY PANEL:  Male CBC Recent Labs  Lab 05/04/19 0559  WBC 9.5  HGB 9.9*  HCT 32.3*  PLT 340   ------------------------------------------------------------------------------------------------------------------ Chemistries  Recent Labs  Lab 05/06/19 0645  NA 136  K 5.1  CL 96*  CO2 25  GLUCOSE 167*  BUN 65*  CREATININE 9.16*  CALCIUM 8.7*   RADIOLOGY:  Dg Chest Port 1 View  Result Date: 05/09/2019 CLINICAL DATA:  Pneumonia. EXAM: PORTABLE CHEST 1 VIEW COMPARISON:  Two-view chest x-ray 05/05/2019  FINDINGS: Heart is enlarged. Atherosclerotic changes are noted at the aortic arch. Mild pulmonary vascular congestion is present. Persistent left lower lobe airspace disease is present. Linear airspace disease at the right base is new. Left axillary stent is in place. The visualized soft tissues and bony thorax are otherwise unremarkable. IMPRESSION: 1. Persistent left lower lobe pneumonia. 2. Linear density at the right base likely reflects atelectasis. 3. Cardiomegaly and mild pulmonary vascular congestion. Electronically Signed   By:  San Morelle M.D.   On: 05/09/2019 05:58   ASSESSMENT AND PLAN:   Syncope- likely cough syncope, given that he passes out every time he has a severe coughing episode.  Recently hospitalized at [facility] from 8/13-8/15 and had an unremarkable syncope work-up. Continue telemetry monitor. Patient planes of nausea, tells me that he because of his fall he thinks he had a concussion and says he has nausea since yesterday but no neurological deficit.  Will do MRI of the brain   A. fib with RVR.  Heart rate is controlled. Amiodarone 150 mg IV bolus and started amiodarone drip per Dr. Ubaldo Glassing. Converted to p.o. amiodarone per Dr. Ubaldo Glassing.   Chronic cough- CHF or COPD. Follows with [facility] for his COPD. Recent RHC 03/30/19 showed very elevated filling pressures. -CXR with left basilar atelectasis vs infiltrate, continue Levaquin, repeat chest x-ray tomorrow, patient wants to be chest x-ray.  Continue Levaquin every 48 hours.   -Continue antitussives -Continue home inhalers -Duonebs prn. Do not have patient's home regimen of cough medicine in the hospital.  I saw documentation from nurse.   -Elevated troponin- likely demand ischemia.  Patient denies any active chest pain.  EKG with new T wave inversions in the inferior leads.  Troponins are stable.  Pneumonia Continue Levaquin 500 MG every 48 hours, received 750 mg 1 dose yesterday, will get 500 mg tomorrow and every 48 hours after.  Chest x-ray today.  Left shoulder pain- occurred after a fall. -Left shoulder x-ray: No fracture. -Gabapentin, flexeril, and tylenol prn  ESRD on HD MWF -Continue Sensipar and Phoslo, continue with hemodialysis as scheduled per Dr. Candiss Norse.  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 Chest x-ray report cardiomegaly with  pulmonary vascular redistribution and mild interstitial edema. Delene Loll was discontinued due to hyperkalemia.  Continue hemodialysis. Try to wean off oxygen. Repeat echocardiogram shows worsening EF with cardiomegaly, awaiting for cardiology inputs.  Hyperkalemia.  Improved with Lokelma.  Tobacco use Smoking cessation was counseled for 3 to 4 minutes, nicotine patch prn   Fall.  Fall precaution.  Patient had a fall in the hospital a week ago and states that he had a concussion and had nausea for 2 days and he feels like he has trouble remembering details, patient ex-wife called and was on hold he was talking to the patient she could also hear about I said to him.   . All the records are reviewed and case discussed with Care Management/Social Worker. Management plans discussed with the patient, his sister and they are in agreement.  CODE STATUS: Full Code  TOTAL TIME TAKING CARE OF THIS PATIENT: 32 minutes.   More than 50% of the time was spent in counseling/coordination of care: YES  POSSIBLE D/C IN 2 DAYS, DEPENDING ON CLINICAL CONDITION.   Epifanio Lesches M.D on 05/09/2019 at 2:32 PM  Between 7am  to 6pm - Pager - [phone]  After 6pm go to [email or web address] - [facility]

## 2019-05-09 NOTE — Progress Notes (Signed)
Pt refusing to get finger stick blood sugar. Conley Simmonds, RN, BSN

## 2019-05-09 NOTE — Progress Notes (Signed)
While getting report from day shift nurse, pt states the nurse that placed the IV placed it in a bad spot and that it is hurting so bad, Richardson Landry, Therapist, sports and I tried to comfort pt and said we could take it out but he would need another one, pt refusing, and pt proceed to pull IV out himself despite [place] telling him to wait for [place] to get some gauze so we could take it out. Hospitalist, Peterson Lombard, NP notified.  Conley Simmonds, RN, BSN

## 2019-05-09 NOTE — Progress Notes (Signed)
Patient not in room, patient is in hemodialysis. Spoke with RN to placed consult back when patient is back in the room.

## 2019-05-09 NOTE — Progress Notes (Signed)
Patient requests to end treatment 1 hour early.

## 2019-05-09 NOTE — Progress Notes (Signed)
[facility]  ROUNDING NOTE   Subjective:  Patient seen at bedside. Complaining of nausea and vomiting today. Due for dialysis today.   Objective:  Vital signs in last 24 hours:  Temp:  [97.4 F (36.3 C)-98.4 F (36.9 C)] 97.6 F (36.4 C) (08/31 1155) Pulse Rate:  [70-97] 97 (08/31 1345) Resp:  [14-19] 15 (08/31 1155) BP: (121-151)/(48-75) 131/73 (08/31 1345) SpO2:  [91 %-100 %] 96 % (08/31 1345) Weight:  [66.3 kg-72.7 kg] 72.7 kg (08/31 1155)  Weight change: 0.544 kg Filed Weights   05/09/19 0542 05/09/19 1145 05/09/19 1155  Weight: 66.3 kg 72.7 kg 72.7 kg    Intake/Output: I/O last 3 completed shifts: In: 480 [P.O.:480] Out: 0    Intake/Output this shift:  No intake/output data recorded.  Physical Exam: General: NAD  Head: Normocephalic, atraumatic. Moist oral mucosal membranes  Eyes: Anicteric   Neck: Supple   Lungs:  CTAB, normal effort  Heart: S1S2 no rubs  Abdomen:  Soft, nontender, BS present  Extremities: no peripheral edema.  Neurologic: Nonfocal, moving all four extremities  Skin: No lesions  Access: LUE AVF    Basic Metabolic Panel: Recent Labs  Lab 05/04/19 0559 05/06/19 0645  NA 139 136  K 5.1 5.1  CL 100 96*  CO2 25 25  GLUCOSE 128* 167*  BUN 58* 65*  CREATININE 8.62* 9.16*  CALCIUM 8.3* 8.7*    Liver Function Tests: No results for input(s): AST, ALT, ALKPHOS, BILITOT, PROT, ALBUMIN in the last 168 hours. No results for input(s): LIPASE, AMYLASE in the last 168 hours. No results for input(s): AMMONIA in the last 168 hours.  CBC: Recent Labs  Lab 05/04/19 0559  WBC 9.5  HGB 9.9*  HCT 32.3*  MCV 97.9  PLT 340    Cardiac Enzymes: No results for input(s): CKTOTAL, CKMB, CKMBINDEX, TROPONINI in the last 168 hours.  BNP: Invalid input(s): POCBNP  CBG: Recent Labs  Lab 05/08/19 0826 05/08/19 1127 05/08/19 1723 05/09/19 0729 05/09/19 1127  GLUCAP 142* 161* 118* 168* 58    Microbiology: Results for  orders placed or performed during the hospital encounter of 04/29/19  SARS Coronavirus 2 [facility] order, Performed in [facility] hospital lab) Nasopharyngeal Nasopharyngeal Swab     Status: None   Collection Time: 04/29/19  6:03 PM   Specimen: Nasopharyngeal Swab  Result Value Ref Range Status   SARS Coronavirus 2 NEGATIVE NEGATIVE Final    Comment: (NOTE) If result is NEGATIVE SARS-CoV-2 target nucleic acids are NOT DETECTED. The SARS-CoV-2 RNA is generally detectable in upper and lower  respiratory specimens during the acute phase of infection. The lowest  concentration of SARS-CoV-2 viral copies this assay can detect is 250  copies / mL. A negative result does not preclude SARS-CoV-2 infection  and should not be used as the sole basis for treatment or other  patient management decisions.  A negative result may occur with  improper specimen collection / handling, submission of specimen other  than nasopharyngeal swab, presence of viral mutation(s) within the  areas targeted by this assay, and inadequate number of viral copies  (<250 copies / mL). A negative result must be combined with clinical  observations, patient history, and epidemiological information. If result is POSITIVE SARS-CoV-2 target nucleic acids are DETECTED. The SARS-CoV-2 RNA is generally detectable in upper and lower  respiratory specimens dur ing the acute phase of infection.  Positive  results are indicative of active infection with SARS-CoV-2.  Clinical  correlation with patient  history and other diagnostic information is  necessary to determine patient infection status.  Positive results do  not rule out bacterial infection or co-infection with other viruses. If result is PRESUMPTIVE POSTIVE SARS-CoV-2 nucleic acids MAY BE PRESENT.   A presumptive positive result was obtained on the submitted specimen  and confirmed on repeat testing.  While 2019 novel coronavirus  (SARS-CoV-2) nucleic acids may be present  in the submitted sample  additional confirmatory testing may be necessary for epidemiological  and / or clinical management purposes  to differentiate between  SARS-CoV-2 and other Sarbecovirus currently known to infect humans.  If clinically indicated additional testing with an alternate test  methodology [phone]) is advised. The SARS-CoV-2 RNA is generally  detectable in upper and lower respiratory sp ecimens during the acute  phase of infection. The expected result is Negative. Fact Sheet for Patients:  StrictlyIdeas.no Fact Sheet for Healthcare Providers: BankingDealers.co.za This test is not yet approved or cleared by the [place] FDA and has been authorized for detection and/or diagnosis of SARS-CoV-2 by FDA under an Emergency Use Authorization (EUA).  This EUA will remain in effect (meaning this test can be used) for the duration of the COVID-19 declaration under Section 564(b)(1) of the Act, 21 U.S.C. section 360bbb-3(b)(1), unless the authorization is terminated or revoked sooner. Performed at [facility], [address] [place], [place] [address]   Bordetella pertussis PCR     Status: None   Collection Time: 04/29/19 11:27 PM   Specimen: Nasopharyngeal Swab  Result Value Ref Range Status   B pertussis, DNA Negative Negative Final   B parapertussis, DNA Negative Negative Final    Comment: (NOTE) This test was developed and its performance characteristics determined by [employer]. It has not been cleared or approved by the U.S. Food and Drug Administration. The FDA has determined that such clearance or approval is not necessary. This test is used for clinical purposes. It should not be regarded as investigational or research. Performed At: [facility] [place], [place] [id number] Rush Farmer MD [id number]     Coagulation Studies: No results for input(s): LABPROT,  INR in the last 72 hours.  Urinalysis: No results for input(s): COLORURINE, LABSPEC, PHURINE, GLUCOSEU, HGBUR, BILIRUBINUR, KETONESUR, PROTEINUR, UROBILINOGEN, NITRITE, LEUKOCYTESUR in the last 72 hours.  Invalid input(s): APPERANCEUR    Imaging: Dg Chest Port 1 View  Result Date: 05/09/2019 CLINICAL DATA:  Pneumonia. EXAM: PORTABLE CHEST 1 VIEW COMPARISON:  Two-view chest x-ray 05/05/2019 FINDINGS: Heart is enlarged. Atherosclerotic changes are noted at the aortic arch. Mild pulmonary vascular congestion is present. Persistent left lower lobe airspace disease is present. Linear airspace disease at the right base is new. Left axillary stent is in place. The visualized soft tissues and bony thorax are otherwise unremarkable. IMPRESSION: 1. Persistent left lower lobe pneumonia. 2. Linear density at the right base likely reflects atelectasis. 3. Cardiomegaly and mild pulmonary vascular congestion. Electronically Signed   By: San Morelle M.D.   On: 05/09/2019 05:58     Medications:    . amiodarone  200 mg Oral BID  . benzonatate  200 mg Oral TID  . calcium acetate  667 mg Oral TID WC  . Chlorhexidine Gluconate Cloth  6 each Topical Daily  . cinacalcet  30 mg Oral 3 times weekly  . epoetin (EPOGEN/PROCRIT) injection  4,000 Units Intravenous Q M,W,F-HD  . feeding supplement (NEPRO CARB STEADY)  237 mL Oral BID BM  . fluticasone  2  spray Each Nare Daily  . heparin  5,000 Units Subcutaneous Q8H  . insulin aspart  0-9 Units Subcutaneous TID WC  . levofloxacin  500 mg Oral Q48H  . living well with diabetes book   Does not apply Once  . multivitamin  1 tablet Oral QHS  . pantoprazole  40 mg Oral Daily  . polyvinyl alcohol  1 drop Right Eye TID  . tobramycin  1 drop Both Eyes Q6H  . umeclidinium-vilanterol  1 puff Inhalation Daily   acetaminophen **OR** acetaminophen, albuterol, cyclobenzaprine, diphenhydrAMINE, gabapentin, guaiFENesin-codeine, ipratropium-albuterol, morphine  injection, ondansetron **OR** ondansetron (ZOFRAN) IV, oxyCODONE, polyethylene glycol  Assessment/ Plan:  [name] is a 58 y.o. black male with end stage renal disease on hemodialysis, hypertension, congestive heart failure, diabetes mellitus type II, history of substance abuse, COPD with ongoing tobacco use  [facility] MWF 65kg 180 minutes 2K bath  1. End stage renal disease with hyperkalemia: Patient due for hemodialysis today.  Minimal ultrafiltration as he is having nausea and vomiting today.  2. Syncope: diagnosed as post-tussive syncope at [facility].   Hypotensive on this admission.  Atrial fibrillation with rapid ventricular response treated with amiodarone.   - cardiology team following  3. Anemia of chronic kidney disease:  -  Lab Results  Component Value Date   HGB 9.9 (L) 05/04/2019   -Administer Epogen 4000 IV with dialysis today.  4. Secondary Hyperparathyroidism:  Lab Results  Component Value Date   PTH 708 (H) 11/24/2018   CALCIUM 8.7 (L) 05/06/2019   PHOS 6.3 (H) 11/24/2018   -Continue to periodically monitor bone metabolism parameters.   LOS: 9 Claud Gowan 8/31/20202:03 PM

## 2019-05-09 NOTE — Progress Notes (Signed)
This note also relates to the following rows which could not be included: SpO2 - Cannot attach notes to unvalidated device data  This note also relates to the following rows which could not be included: SpO2 - Cannot attach notes to unvalidated device data  Post HD completion Assessment Pt requested tx to end prior than the prescribed time.  UF Goal precribed 1033m. Actual met UF  Is 1077. BP at tx completion 128/63 HR 80. Pt refused EKJ monitor electrodes on his chest due to generalized rash.    05/09/19 1430  Hand-Off documentation  Report given to (Full Name) SSalome SpottedRN  Report received from (Full Name) DNewt MinionRN   Vital Signs  Temp 97.8 F (36.6 C)  Temp Source Oral  Pulse Rate 80  BP 128/63  BP Location Left Arm  BP Method Automatic  Patient Position (if appropriate) Lying  Oxygen Therapy  O2 Device Nasal Cannula  O2 Flow Rate (L/min) 2.5 L/min  During Hemodialysis Assessment  Blood Flow Rate (mL/min) 400 mL/min  Arterial Pressure (mmHg) -140 mmHg  Venous Pressure (mmHg) 170 mmHg  Transmembrane Pressure (mmHg) 50 mmHg  Ultrafiltration Rate (mL/min) 420 mL/min  Dialysate Flow Rate (mL/min) 600 ml/min  Conductivity: Machine  14.1  HD Safety Checks Performed Yes  Intra-Hemodialysis Comments Tx completed (Pt requested Tx to end)  Post-Hemodialysis Assessment  Rinseback Volume (mL) 250 mL  KECN 53.3 V  Dialyzer Clearance Lightly streaked  Duration of HD Treatment -hour(s) 2.45 hour(s)  Hemodialysis Intake (mL) 500 mL  UF Total -Machine (mL) 1077 mL  Net UF (mL) 577 mL  Tolerated HD Treatment Yes  Post-Hemodialysis Comments  (Pt requested the Tx to end)  AVG/AVF Arterial Site Held (minutes) 5 minutes  AVG/AVF Venous Site Held (minutes) 8 minutes  Fistula / Graft Left Upper arm  Placement Date: 05/17/18   Placed prior to admission: Yes  Orientation: Left  Access Location: Upper arm  Site Condition No complications  Status Deaccessed  Drainage Description  None

## 2019-05-09 NOTE — Progress Notes (Signed)
PT Cancellation Note  Patient Details Name: Jeremy [name] MRN: [id number] DOB: 09-11-1960   Cancelled Treatment:    Reason Eval/Treat Not Completed: Patient at procedure or test/unavailable   Multiple attempts made today.  Pt out of room for dialysis.  Will continue as appropriate.   Chesley Noon 05/09/2019, 2:46 PM

## 2019-05-09 NOTE — Care Management Important Message (Signed)
Important Message  Patient Details  Name: Jeremy [name] MRN: [id number] Date of Birth: 1961/09/02   Medicare Important Message Given:  Yes     Dannette Barbara 05/09/2019, 11:52 AM

## 2019-05-09 NOTE — Progress Notes (Signed)
HD Tx end.  Pt requested HD tx to end   05/09/19 1426  Vital Signs  Pulse Rate 76  BP 128/63  Oxygen Therapy  SpO2 97 %  O2 Device Nasal Cannula  O2 Flow Rate (L/min) 2.5 L/min  During Hemodialysis Assessment  Blood Flow Rate (mL/min) 400 mL/min  Arterial Pressure (mmHg) -140 mmHg  Venous Pressure (mmHg) 170 mmHg  Transmembrane Pressure (mmHg) 50 mmHg  Ultrafiltration Rate (mL/min) 420 mL/min  Dialysate Flow Rate (mL/min) 600 ml/min  Conductivity: Machine  14.1  HD Safety Checks Performed Yes  Intra-Hemodialysis Comments Tx completed (Pt requested Tx to end)

## 2019-05-09 NOTE — Progress Notes (Signed)
Pre HD note   05/09/19 1145  Hand-Off documentation  Report given to (Full Name) Sueanne Margarita RN   Report received from (Full Name) Salome Spotted RN   Vital Signs  Temp 97.6 F (36.4 C)  Temp Source Oral  Pulse Rate 70  Pulse Rate Source Monitor  Resp 15  BP 140/64  BP Location Right Arm  BP Method Automatic  Patient Position (if appropriate) Lying  Oxygen Therapy  SpO2 93 %  O2 Device Nasal Cannula  O2 Flow Rate (L/min) 1 L/min  Pain Assessment  Pain Scale 0-10  Pain Score 0  Dialysis Weight  Weight 72.7 kg  Type of Weight Pre-Dialysis  Time-Out for Hemodialysis  What Procedure? HD  Pt Identifiers(min of two) First/Last Name;MRN/Account#  Correct Site? Yes  Correct Side? Yes  Correct Procedure? Yes  Consents Verified? Yes  Engineer, civil (consulting) Number 4  Station Number 1  UF/Alarm Test Passed  Conductivity: Meter 13.8  Conductivity: Machine  14.2  pH 7.4  Reverse Osmosis Main  Normal Saline Lot Number [id number]  Dialyzer Lot Number [id number]  Disposable Set Lot Number [id number]  Machine Temperature 98.6 F (37 C)  Musician and Audible Yes  Blood Lines Intact and Secured Yes  Pre Treatment Patient Checks  Vascular access used during treatment Fistula  HD catheter dressing before treatment WDL  Patient is receiving dialysis in a chair  (in bed)  Hepatitis B Surface Antigen Results Negative  Date Hepatitis B Surface Antigen Drawn 07/09/18  Hepatitis B Surface Antibody  (>10)  Date Hepatitis B Surface Antibody Drawn 07/09/18  ECG (Telemetry) Monitor On No (Pt refused)  Prime Ordered Normal Saline  Length of  DialysisTreatment -hour(s) 3.5 Hour(s)  Dialyzer Elisio 17H NR  Dialysate 3K;2.5 Ca  Dialysis Anticoagulant None  Dialysate Flow Ordered 600  Blood Flow Rate Ordered 400 mL/min  Ultrafiltration Goal 1000 Liters  Education / Care Plan  Dialysis Education Provided Yes  Documented Education in Care Plan Yes  Fistula / Graft Left Upper arm   Placement Date: 05/17/18   Placed prior to admission: Yes  Orientation: Left  Access Location: Upper arm  Site Condition No complications  Fistula / Graft Assessment Present;Thrill;Bruit  Status Accessed  Drainage Description None

## 2019-05-09 NOTE — Progress Notes (Signed)
Ch f/u with pt. Pt still has a significant cough yet was alert and oriented. Ch provided social support and monitored pt to see if his coughing would subside. Pt is preparing for dialysis.     05/09/19 1000  Clinical Encounter Type  Visited With Patient;Health care provider  Visit Type Follow-up;Psychological support;Social support  Spiritual Encounters  Spiritual Needs Grief support;Emotional  Stress Factors  Patient Stress Factors Health changes;Major life changes  Family Stress Factors None identified  Advance Directives (For Healthcare)  Does Patient Have a Medical Advance Directive? No  Would patient like information on creating a medical advance directive? Yes (Inpatient - patient defers creating a medical advance directive at this time - Information given)

## 2019-05-10 ENCOUNTER — Inpatient Hospital Stay: Payer: Medicare Other

## 2019-05-10 ENCOUNTER — Encounter: Payer: Self-pay | Admitting: *Deleted

## 2019-05-10 LAB — CBC
HCT: 30.7 % — ABNORMAL LOW (ref 39.0–52.0)
Hemoglobin: 9.4 g/dL — ABNORMAL LOW (ref 13.0–17.0)
MCH: 29.4 pg (ref 26.0–34.0)
MCHC: 30.6 g/dL (ref 30.0–36.0)
MCV: 95.9 fL (ref 80.0–100.0)
Platelets: 244 10*3/uL (ref 150–400)
RBC: 3.2 MIL/uL — ABNORMAL LOW (ref 4.22–5.81)
RDW: 15.7 % — ABNORMAL HIGH (ref 11.5–15.5)
WBC: 4.8 10*3/uL (ref 4.0–10.5)
nRBC: 0 % (ref 0.0–0.2)

## 2019-05-10 LAB — COMPREHENSIVE METABOLIC PANEL
ALT: 32 U/L (ref 0–44)
AST: 18 U/L (ref 15–41)
Albumin: 2.9 g/dL — ABNORMAL LOW (ref 3.5–5.0)
Alkaline Phosphatase: 193 U/L — ABNORMAL HIGH (ref 38–126)
Anion gap: 14 (ref 5–15)
BUN: 68 mg/dL — ABNORMAL HIGH (ref 6–20)
CO2: 28 mmol/L (ref 22–32)
Calcium: 8.4 mg/dL — ABNORMAL LOW (ref 8.9–10.3)
Chloride: 91 mmol/L — ABNORMAL LOW (ref 98–111)
Creatinine, Ser: 9.55 mg/dL — ABNORMAL HIGH (ref 0.61–1.24)
GFR calc Af Amer: 6 mL/min — ABNORMAL LOW (ref >60)
GFR calc non Af Amer: 5 mL/min — ABNORMAL LOW (ref >60)
Glucose, Bld: 187 mg/dL — ABNORMAL HIGH (ref 70–99)
Potassium: 6.1 mmol/L — ABNORMAL HIGH (ref 3.5–5.1)
Sodium: 133 mmol/L — ABNORMAL LOW (ref 135–145)
Total Bilirubin: 0.3 mg/dL (ref 0.3–1.2)
Total Protein: 6.6 g/dL (ref 6.5–8.1)

## 2019-05-10 LAB — CREATININE, SERUM
Creatinine, Ser: 8.61 mg/dL — ABNORMAL HIGH (ref 0.61–1.24)
GFR calc Af Amer: 7 mL/min — ABNORMAL LOW (ref >60)
GFR calc non Af Amer: 6 mL/min — ABNORMAL LOW (ref >60)

## 2019-05-10 LAB — GLUCOSE, CAPILLARY
Glucose-Capillary: 165 mg/dL — ABNORMAL HIGH (ref 70–99)
Glucose-Capillary: 174 mg/dL — ABNORMAL HIGH (ref 70–99)

## 2019-05-10 NOTE — Plan of Care (Signed)
  Problem: Pain Managment: Goal: General experience of comfort will improve Outcome: Progressing Note: Complaints of pain twice, right shoulder pain   Problem: Safety: Goal: Ability to remain free from injury will improve Outcome: Progressing Note: Pt continues to refuse bed alarm, educated to call before getting up   Problem: Skin Integrity: Goal: Risk for impaired skin integrity will decrease Outcome: Progressing

## 2019-05-10 NOTE — Progress Notes (Addendum)
[facility] [place] NAME: Jeremy [name]    MR#:  [id number]  DATE OF BIRTH:  Dec 28, 1960  Patient complains of nausea, vomiting, bloating about the MRI of the brain report showing no acute infarct or bleed and he says report is wrong .  He also says he needs to work to get his card information so he can pay the bills.  Told the patient's registered nurse.  CHIEF COMPLAINT:   Chief Complaint  Patient presents with   Near Syncope   Complains of nausea today.  REVIEW OF SYSTEMS:  Review of Systems  Constitutional: Positive for malaise/fatigue. Negative for chills and fever.  HENT: Negative for sore throat.   Eyes: Negative for blurred vision and double vision.  Respiratory: Positive for cough. Negative for hemoptysis, sputum production, shortness of breath, wheezing and stridor.   Cardiovascular: Negative for chest pain, palpitations, orthopnea and leg swelling.  Gastrointestinal: Positive for nausea and vomiting. Negative for abdominal pain, blood in stool, diarrhea and melena.  Genitourinary: Negative for dysuria, flank pain and hematuria.  Musculoskeletal: Negative for back pain, joint pain and neck pain.  Skin: Negative for itching and rash.  Neurological: Negative for dizziness, sensory change, focal weakness, seizures, loss of consciousness, weakness and headaches.  Endo/Heme/Allergies: Negative for polydipsia.  Psychiatric/Behavioral: Negative for depression. The patient is not nervous/anxious.     DRUG ALLERGIES:  No Known Allergies VITALS:  Blood pressure (!) 145/68, pulse 73, temperature 98.7 F (37.1 C), temperature source Oral, resp. rate 19, height 5\' 6"  (1.676 m), weight 72.7 kg, SpO2 100 %. PHYSICAL EXAMINATION:  Physical Exam HENT:     Head: Normocephalic.     Mouth/Throat:     Mouth: Mucous membranes are moist.  Eyes:     General: No scleral icterus.    Conjunctiva/sclera: Conjunctivae normal.     Pupils: Pupils are  equal, round, and reactive to light.  Neck:     Musculoskeletal: Normal range of motion and neck supple.     Vascular: No JVD.     Trachea: No tracheal deviation.     Comments: Positive JVD. Cardiovascular:     Rate and Rhythm: Normal rate. Rhythm irregular.     Heart sounds: Normal heart sounds. No murmur. No gallop.   Pulmonary:     Effort: Pulmonary effort is normal. No respiratory distress.     Breath sounds: No stridor. Rales present. No wheezing or rhonchi.  Abdominal:     General: Bowel sounds are normal. There is no distension.     Palpations: Abdomen is soft.     Tenderness: There is no abdominal tenderness. There is no rebound.  Musculoskeletal: Normal range of motion.        General: No tenderness.     Right lower leg: No edema.     Left lower leg: No edema.  Skin:    Findings: No erythema or rash.  Neurological:     General: No focal deficit present.     Mental Status: He is alert and oriented to person, place, and time.     Cranial Nerves: No cranial nerve deficit.  Psychiatric:        Mood and Affect: Mood normal.    LABORATORY PANEL:  Male CBC Recent Labs  Lab 05/10/19 0510  WBC 4.8  HGB 9.4*  HCT 30.7*  PLT 244   ------------------------------------------------------------------------------------------------------------------ Chemistries  Recent Labs  Lab 05/06/19 0645 05/10/19 0510  NA 136  --  K 5.1  --   CL 96*  --   CO2 25  --   GLUCOSE 167*  --   BUN 65*  --   CREATININE 9.16* 8.61*  CALCIUM 8.7*  --    RADIOLOGY:  Mr Brain Wo Contrast  Result Date: 05/10/2019 CLINICAL DATA:  Vertigo, persistent, central. Additional history: Patient complains of nausea EXAM: MRI HEAD WITHOUT CONTRAST TECHNIQUE: Multiplanar, multiecho pulse sequences of the brain and surrounding structures were obtained without intravenous contrast. COMPARISON:  Head CT 05/03/2019 FINDINGS: Brain: Multiple sequences are motion degraded. No restricted diffusion is  demonstrated to suggest acute or recent subacute infarction. No evidence of intracranial mass. No midline shift or extra-axial fluid collection. Mild scattered T2/FLAIR hyperintensity within the cerebral white matter is nonspecific, but consistent with chronic small vessel ischemic disease. Small chronic lacunar infarcts within the left corona radiata/basal ganglia, left caudate nucleus and superior left cerebellum. Additionally, a small focus of chronic encephalomalacia is questioned within the inferomedial right cerebellum (series 15, image 12). Punctate chronic microhemorrhage within the right periatrial white matter. Parenchymal volume is age-appropriate. Vascular: Flow voids maintained within the proximal large arterial vessels. Skull and upper cervical spine: Normal calvarial marrow signal. Nonspecific T1 hypointense marrow signal within the visualized cervical spine. Sinuses/Orbits: Imaged globes and orbits demonstrate no acute abnormality. Mild scattered paranasal sinus mucosal thickening. Left mastoid effusion. IMPRESSION: Motion degraded exam. No evidence of acute intracranial abnormality. A small focus of chronic encephalomalacia is questioned within the inferomedial right cerebellum, indeterminate in etiology. Mild chronic small vessel ischemic disease with small chronic lacunar infarcts in the left corona radiata/basal ganglia, left caudate nucleus and superior left cerebellum. Left mastoid effusion. T1 hypointense marrow signal within the visualized cervical spine. Findings are nonspecific but may be seen in the setting of chronic anemia or a marrow infiltrative process. Clinical correlation is recommended. Electronically Signed   By: Kellie Simmering   On: 05/10/2019 10:23   ASSESSMENT AND PLAN:   Syncope- likely cough syncope, given that he passes out every time he has a severe coughing episode.  Recently hospitalized at [facility] from 8/13-8/15 and had an unremarkable syncope work-up. Continue telemetry  monitor. Nausea, vomiting, history of fall, MRI of the brain this morning shows encephalomalacia, small chronic lacunar infarct in the left caudate nucleus, corona radiata,  A. fib with RVR.  Heart rate is controlled. Amiodarone 150 mg IV bolus and started amiodarone drip per Dr. Ubaldo Glassing. Converted to p.o. amiodarone per Dr. Ubaldo Glassing.   Chronic cough- CHF or COPD. Follows with [facility] for his COPD. Recent RHC 03/30/19 showed very elevated filling pressures. -Chest x-ray repeated today shows persistent left base pneumonia, linear density at the right base, cardiomegaly, mild pulmonary vascular congestion, continue Levaquin every 48 hours.   -Continue antitussives -Continue home inhalers -Duonebs prn. Do not have patient's home regimen of cough medicine in the hospital.  I saw documentation from nurse.   -Elevated troponin- likely demand ischemia.  Patient denies any active chest pain.  EKG with new T wave inversions in the inferior leads.  Troponins are stable. History of nonischemic cardiomyopathy with EF of 45%, history of moderate mitral regurgitation, patient is on amlodipine, Entresto as per [facility] documentation but stop her Entresto secondary to hyperkalemia Bacterial pneumonia Continue Levaquin 500 MG every 48 hours, received 750 mg 1 dose yesterday, will get 500 mg tomorrow and every 48 hours after.    Left shoulder pain- occurred after a fall. -Left shoulder x-ray: No fracture. -Gabapentin, flexeril,  and tylenol prn  ESRD on HD MWF -Continue Sensipar and Phoslo, continue with hemodialysis as scheduled per Dr. Candiss Norse.  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 Chest x-ray report cardiomegaly with pulmonary vascular redistribution and mild interstitial edema. Delene Loll was discontinued due to  hyperkalemia.  Continue hemodialysis. Try to wean off oxygen. Repeat echocardiogram shows worsening EF with cardiomegaly, awaiting for cardiology inputs.  Hyperkalemia.  Improved with Lokelma.  Tobacco use Smoking cessation was counseled for 3 to 4 minutes, nicotine patch prn   Fall.  Fall precaution.  Patient had a fall in the hospital a week ago and states that he had a concussion and had nausea for 2 days and he feels like he has trouble remembering details, patient ex-wife called and was on hold he was talking to the patient she could also hear about I said to him. Patient has posttussive syncope as per documentation from [facility], Newly diagnosed diabetes mellitus type 2, hemoglobin A1c 7.6, patient is refusing sliding scale insulin, says he had diabetes due to prednisone but he does have diabetes but that he keeps refusing insulin explained to him he has diabetes but he does not want to consider it is diabetes and keeps refusing insulin. . All the records are reviewed and case discussed with Care Management/Social Worker. Management plans discussed with the patient, his sister and they are in agreement.  CODE STATUS: Full Code  TOTAL TIME TAKING CARE OF THIS PATIENT: 32 minutes.   More than 50% of the time was spent in counseling/coordination of care: YES  POSSIBLE D/C IN 2 DAYS, DEPENDING ON CLINICAL CONDITION.   Epifanio Lesches M.D on 05/10/2019 at 1:44 PM  Between 7am to 6pm - Pager - [phone]  After 6pm go to [email or web address] - [facility]

## 2019-05-10 NOTE — TOC Progression Note (Signed)
Transition of Care [facility]) - Progression Note    Patient Details  Name: Jeremy [name] MRN: [id number] Date of Birth: 02/27/1961  [facility] of [facility]) CM/SW Contact  Katrina Stack, RN Phone Number: 05/10/2019, 6:44 PM  Clinical Narrative:   Physical therapy treatment today contraindicated due to potassium of 6.1. Physical  recommendations from initial evaluation is for home health with someone to check in on him.  At that time he ambulated 100 feet. He was not available when CM made attempt to discuss  disposition. It appears he has not participated in a therapy session since the initial evaluation. Would be help to have another evaluation to determine if recommendation has changed  Expected Discharge Plan: [place] Barriers to Discharge: Continued Medical Work up  Expected Discharge Plan and Services Expected Discharge Plan: [place] In-house Referral: Clinical Social Work   Post Acute Care Choice: [place] Living arrangements for the past 2 months: Apartment Expected Discharge Date: 05/01/19               DME Arranged: N/A DME Agency: NA     Representative spoke with at DME Agency: na             Social Determinants of Health ([place]) Interventions    Readmission Risk Interventions Readmission Risk Prevention Plan 05/03/2019 05/01/2019 04/04/2019  Transportation Screening Complete Complete -  Medication Review [employer]) Complete Complete -  PCP or Specialist appointment within 3-5 days of discharge Complete - Complete  HRI or Home Care Consult Complete - -  SW Recovery Care/Counseling Consult Complete - Complete  Palliative Care Screening - Not Applicable -  Skilled Nursing Facility Complete - -  Some recent data might be hidden

## 2019-05-10 NOTE — Progress Notes (Signed)
PT Cancellation Note  Patient Details Name: Jeremy [name] MRN: [id number] DOB: June 02, 1961   Cancelled Treatment:    Reason Eval/Treat Not Completed: Medical issues which prohibited therapy(K+ 6.1 this date, not appropriate for PT treatment. WIll follow remotely and resume treatment once appropriate.)  4:56 PM, 05/10/19 Etta Grandchild, PT, DPT Physical Therapist - [facility]  [phone] ([place])   Buccola,Allan C 05/10/2019, 4:56 PM

## 2019-05-10 NOTE — Progress Notes (Signed)
[facility]  ROUNDING NOTE   Subjective:  Patient still having some nausea and vomiting. Completed dialysis yesterday. States that his coughing has improved a bit.   Objective:  Vital signs in last 24 hours:  Temp:  [97.8 F (36.6 C)-98.7 F (37.1 C)] 98.7 F (37.1 C) (09/01 0804) Pulse Rate:  [73-80] 73 (09/01 0804) Resp:  [18-19] 19 (09/01 0804) BP: (128-162)/(63-75) 145/68 (09/01 0804) SpO2:  [97 %-100 %] 100 % (09/01 0804)  Weight change: 6.384 kg Filed Weights   05/09/19 0542 05/09/19 1145 05/09/19 1155  Weight: 66.3 kg 72.7 kg 72.7 kg    Intake/Output: I/O last 3 completed shifts: In: 360 [P.O.:360] Out: 577 [Other:577]   Intake/Output this shift:  Total I/O In: 240 [P.O.:240] Out: -   Physical Exam: General: NAD  Head: Normocephalic, atraumatic. Moist oral mucosal membranes  Eyes: Anicteric   Neck: Supple   Lungs:  CTAB, normal effort  Heart: S1S2 no rubs  Abdomen:  Soft, nontender, BS present  Extremities: no peripheral edema.  Neurologic: Nonfocal, moving all four extremities  Skin: No lesions  Access: LUE AVF    Basic Metabolic Panel: Recent Labs  Lab 05/04/19 0559 05/06/19 0645 05/10/19 0510  NA 139 136  --   K 5.1 5.1  --   CL 100 96*  --   CO2 25 25  --   GLUCOSE 128* 167*  --   BUN 58* 65*  --   CREATININE 8.62* 9.16* 8.61*  CALCIUM 8.3* 8.7*  --     Liver Function Tests: No results for input(s): AST, ALT, ALKPHOS, BILITOT, PROT, ALBUMIN in the last 168 hours. No results for input(s): LIPASE, AMYLASE in the last 168 hours. No results for input(s): AMMONIA in the last 168 hours.  CBC: Recent Labs  Lab 05/04/19 0559 05/10/19 0510  WBC 9.5 4.8  HGB 9.9* 9.4*  HCT 32.3* 30.7*  MCV 97.9 95.9  PLT 340 244    Cardiac Enzymes: No results for input(s): CKTOTAL, CKMB, CKMBINDEX, TROPONINI in the last 168 hours.  BNP: Invalid input(s): POCBNP  CBG: Recent Labs  Lab 05/09/19 0729 05/09/19 1127 05/09/19 1644  05/10/19 0800 05/10/19 1152  GLUCAP 168* 86 172* 165* 174*    Microbiology: Results for orders placed or performed during the hospital encounter of 04/29/19  SARS Coronavirus 2 [facility] order, Performed in [facility] hospital lab) Nasopharyngeal Nasopharyngeal Swab     Status: None   Collection Time: 04/29/19  6:03 PM   Specimen: Nasopharyngeal Swab  Result Value Ref Range Status   SARS Coronavirus 2 NEGATIVE NEGATIVE Final    Comment: (NOTE) If result is NEGATIVE SARS-CoV-2 target nucleic acids are NOT DETECTED. The SARS-CoV-2 RNA is generally detectable in upper and lower  respiratory specimens during the acute phase of infection. The lowest  concentration of SARS-CoV-2 viral copies this assay can detect is 250  copies / mL. A negative result does not preclude SARS-CoV-2 infection  and should not be used as the sole basis for treatment or other  patient management decisions.  A negative result may occur with  improper specimen collection / handling, submission of specimen other  than nasopharyngeal swab, presence of viral mutation(s) within the  areas targeted by this assay, and inadequate number of viral copies  (<250 copies / mL). A negative result must be combined with clinical  observations, patient history, and epidemiological information. If result is POSITIVE SARS-CoV-2 target nucleic acids are DETECTED. The SARS-CoV-2 RNA is generally detectable in  upper and lower  respiratory specimens dur ing the acute phase of infection.  Positive  results are indicative of active infection with SARS-CoV-2.  Clinical  correlation with patient history and other diagnostic information is  necessary to determine patient infection status.  Positive results do  not rule out bacterial infection or co-infection with other viruses. If result is PRESUMPTIVE POSTIVE SARS-CoV-2 nucleic acids MAY BE PRESENT.   A presumptive positive result was obtained on the submitted specimen  and  confirmed on repeat testing.  While 2019 novel coronavirus  (SARS-CoV-2) nucleic acids may be present in the submitted sample  additional confirmatory testing may be necessary for epidemiological  and / or clinical management purposes  to differentiate between  SARS-CoV-2 and other Sarbecovirus currently known to infect humans.  If clinically indicated additional testing with an alternate test  methodology [phone]) is advised. The SARS-CoV-2 RNA is generally  detectable in upper and lower respiratory sp ecimens during the acute  phase of infection. The expected result is Negative. Fact Sheet for Patients:  StrictlyIdeas.no Fact Sheet for Healthcare Providers: BankingDealers.co.za This test is not yet approved or cleared by the [place] FDA and has been authorized for detection and/or diagnosis of SARS-CoV-2 by FDA under an Emergency Use Authorization (EUA).  This EUA will remain in effect (meaning this test can be used) for the duration of the COVID-19 declaration under Section 564(b)(1) of the Act, 21 U.S.C. section 360bbb-3(b)(1), unless the authorization is terminated or revoked sooner. Performed at [facility], [address] [place], [place] [address]   Bordetella pertussis PCR     Status: None   Collection Time: 04/29/19 11:27 PM   Specimen: Nasopharyngeal Swab  Result Value Ref Range Status   B pertussis, DNA Negative Negative Final   B parapertussis, DNA Negative Negative Final    Comment: (NOTE) This test was developed and its performance characteristics determined by [employer]. It has not been cleared or approved by the U.S. Food and Drug Administration. The FDA has determined that such clearance or approval is not necessary. This test is used for clinical purposes. It should not be regarded as investigational or research. Performed At: [facility] [place], [place]  [id number] Rush Farmer MD [id number]     Coagulation Studies: No results for input(s): LABPROT, INR in the last 72 hours.  Urinalysis: No results for input(s): COLORURINE, LABSPEC, PHURINE, GLUCOSEU, HGBUR, BILIRUBINUR, KETONESUR, PROTEINUR, UROBILINOGEN, NITRITE, LEUKOCYTESUR in the last 72 hours.  Invalid input(s): APPERANCEUR    Imaging: Mr Brain Wo Contrast  Result Date: 05/10/2019 CLINICAL DATA:  Vertigo, persistent, central. Additional history: Patient complains of nausea EXAM: MRI HEAD WITHOUT CONTRAST TECHNIQUE: Multiplanar, multiecho pulse sequences of the brain and surrounding structures were obtained without intravenous contrast. COMPARISON:  Head CT 05/03/2019 FINDINGS: Brain: Multiple sequences are motion degraded. No restricted diffusion is demonstrated to suggest acute or recent subacute infarction. No evidence of intracranial mass. No midline shift or extra-axial fluid collection. Mild scattered T2/FLAIR hyperintensity within the cerebral white matter is nonspecific, but consistent with chronic small vessel ischemic disease. Small chronic lacunar infarcts within the left corona radiata/basal ganglia, left caudate nucleus and superior left cerebellum. Additionally, a small focus of chronic encephalomalacia is questioned within the inferomedial right cerebellum (series 15, image 12). Punctate chronic microhemorrhage within the right periatrial white matter. Parenchymal volume is age-appropriate. Vascular: Flow voids maintained within the proximal large arterial vessels. Skull and upper cervical spine: Normal calvarial marrow signal. Nonspecific T1  hypointense marrow signal within the visualized cervical spine. Sinuses/Orbits: Imaged globes and orbits demonstrate no acute abnormality. Mild scattered paranasal sinus mucosal thickening. Left mastoid effusion. IMPRESSION: Motion degraded exam. No evidence of acute intracranial abnormality. A small focus of chronic encephalomalacia is  questioned within the inferomedial right cerebellum, indeterminate in etiology. Mild chronic small vessel ischemic disease with small chronic lacunar infarcts in the left corona radiata/basal ganglia, left caudate nucleus and superior left cerebellum. Left mastoid effusion. T1 hypointense marrow signal within the visualized cervical spine. Findings are nonspecific but may be seen in the setting of chronic anemia or a marrow infiltrative process. Clinical correlation is recommended. Electronically Signed   By: Kellie Simmering   On: 05/10/2019 10:23   Dg Chest Port 1 View  Result Date: 05/09/2019 CLINICAL DATA:  Pneumonia. EXAM: PORTABLE CHEST 1 VIEW COMPARISON:  Two-view chest x-ray 05/05/2019 FINDINGS: Heart is enlarged. Atherosclerotic changes are noted at the aortic arch. Mild pulmonary vascular congestion is present. Persistent left lower lobe airspace disease is present. Linear airspace disease at the right base is new. Left axillary stent is in place. The visualized soft tissues and bony thorax are otherwise unremarkable. IMPRESSION: 1. Persistent left lower lobe pneumonia. 2. Linear density at the right base likely reflects atelectasis. 3. Cardiomegaly and mild pulmonary vascular congestion. Electronically Signed   By: San Morelle M.D.   On: 05/09/2019 05:58     Medications:    . amiodarone  200 mg Oral BID  . benzonatate  200 mg Oral TID  . calcium acetate  667 mg Oral TID WC  . Chlorhexidine Gluconate Cloth  6 each Topical Daily  . cinacalcet  30 mg Oral 3 times weekly  . epoetin (EPOGEN/PROCRIT) injection  4,000 Units Intravenous Q M,W,F-HD  . feeding supplement (NEPRO CARB STEADY)  237 mL Oral BID BM  . fluticasone  2 spray Each Nare Daily  . heparin  5,000 Units Subcutaneous Q8H  . insulin aspart  0-9 Units Subcutaneous TID WC  . levofloxacin  500 mg Oral Q48H  . living well with diabetes book   Does not apply Once  . multivitamin  1 tablet Oral QHS  . pantoprazole  40 mg  Oral Daily  . polyvinyl alcohol  1 drop Right Eye TID  . tobramycin  1 drop Both Eyes Q6H  . umeclidinium-vilanterol  1 puff Inhalation Daily   acetaminophen **OR** acetaminophen, albuterol, cyclobenzaprine, diphenhydrAMINE, gabapentin, guaiFENesin-codeine, ipratropium-albuterol, morphine injection, ondansetron **OR** ondansetron (ZOFRAN) IV, oxyCODONE, polyethylene glycol  Assessment/ Plan:  [name] is a 58 y.o. black male with end stage renal disease on hemodialysis, hypertension, congestive heart failure, diabetes mellitus type II, history of substance abuse, COPD with ongoing tobacco use  [facility] MWF 65kg 180 minutes 2K bath  1. End stage renal disease with hyperkalemia: Patient completed dialysis yesterday.  Note indication for dialysis today.  We will plan for dialysis again tomorrow.  2. Syncope: diagnosed as post-tussive syncope at [facility].   Hypotensive on this admission.  Atrial fibrillation with rapid ventricular response treated with amiodarone.   - cardiology team following  3. Anemia of chronic kidney disease:  -  Lab Results  Component Value Date   HGB 9.4 (L) 05/10/2019   -Continue Epogen 4000 units IV with dialysis.  4. Secondary Hyperparathyroidism:  Lab Results  Component Value Date   PTH 708 (H) 11/24/2018   CALCIUM 8.7 (L) 05/06/2019   PHOS 6.3 (H) 11/24/2018   -Repeat serum phosphorus tomorrow.  Otherwise maintain the patient on calcium acetate.   LOS: 10 Teyonna Plaisted 9/1/20202:24 PM

## 2019-05-11 ENCOUNTER — Inpatient Hospital Stay: Payer: Medicare Other

## 2019-05-11 DIAGNOSIS — R55 Syncope and collapse: Secondary | ICD-10-CM

## 2019-05-11 DIAGNOSIS — F0781 Postconcussional syndrome: Secondary | ICD-10-CM

## 2019-05-11 LAB — POTASSIUM: Potassium: 4.6 mmol/L (ref 3.5–5.1)

## 2019-05-11 LAB — GLUCOSE, CAPILLARY: Glucose-Capillary: 160 mg/dL — ABNORMAL HIGH (ref 70–99)

## 2019-05-11 MED ORDER — GUAIFENESIN ER 600 MG PO TB12
600.0000 mg | ORAL_TABLET | Freq: Two times a day (BID) | ORAL | Status: DC | PRN
  Administered 2019-05-11 – 2019-05-12 (×4): 600 mg via ORAL
  Filled 2019-05-11 (×4): qty 1

## 2019-05-11 MED ORDER — SODIUM ZIRCONIUM CYCLOSILICATE 10 G PO PACK
10.0000 g | PACK | Freq: Every day | ORAL | Status: DC
  Administered 2019-05-11 – 2019-05-12 (×2): 10 g via ORAL
  Filled 2019-05-11 (×3): qty 1

## 2019-05-11 MED ORDER — GUAIFENESIN-DM 100-10 MG/5ML PO SYRP: 5.0000 mL | ORAL_SOLUTION | ORAL | Status: DC | PRN

## 2019-05-11 NOTE — Progress Notes (Signed)
Nutrition Follow Up Note   DOCUMENTATION CODES:   Not applicable  INTERVENTION:   Provide Nepro Shake po BID, each supplement provides 425 kcal and 19 grams protein.  Provide Rena-vite QHS.  NUTRITION DIAGNOSIS:   Inadequate oral intake related to decreased appetite as evidenced by per patient/family report.  GOAL:   Patient will meet greater than or equal to 90% of their needs  -progressing   MONITOR:   PO intake, Supplement acceptance, Labs, Weight trends, I & O's  ASSESSMENT:   58 year old male with PMHx of HTN, COPD, ESRD on HD admitted with syncope, A-fib RVR, chronic cough, acute respiratory failure with hypoxia due to acute on chronic systolic CHF, also with new diagnosis of DM.   Pt with improved appetite and oral intake; pt eating 100% of meals and drinking some Nepro. Pt is still having some intermittent nausea and vomiting. Per chart, pt is weight stable since admit.   Medications reviewed and include: phoslo, cinacalcet, epogen, heparin, rena-vite, protonix, lokelma  Labs reviewed: Na 133(L), K 6.1(H), BUN 68(H), creat 9.55(H) Hgb 9.4(L), Hct 30.7(L) cbgs- 165, 174, 160 x 24 hrs iPTH- 708(H)- 3/18  Diet Order:   Diet Order            Diet renal with fluid restriction Fluid restriction: 1200 mL Fluid; Room service appropriate? Yes; Fluid consistency: Thin  Diet effective now             EDUCATION NEEDS:   Not appropriate for education at this time(Patient refusing)  Skin:  Skin Assessment: Reviewed RN Assessment  Last BM:  8/29  Height:   Ht Readings from Last 1 Encounters:  04/29/19 5\' 6"  (1.676 m)   Weight:   Wt Readings from Last 1 Encounters:  05/11/19 67 kg   Ideal Body Weight:  64.5 kg  BMI:  Body mass index is 23.84 kg/m.  Estimated Nutritional Needs:   Kcal:  1700-1900  Protein:  85-95 grams  Fluid:  UOP + 1 L  Jeremy Distance MS, RD, LDN Pager #- [phone] Office#- [phone] After Hours Pager: [phone]

## 2019-05-11 NOTE — Progress Notes (Signed)
[facility] NAME: Jeremy [name]    MR#:  [id number]  DATE OF BIRTH:  [birth year]  P continues to complain of nausea, vomiting there must be something in the brain even though I told him about the MRI results.  No abdominal pain.  Patient keeps refusing insulin, known to have diagnosed with diabetes mellitus this time.  CHIEF COMPLAINT:   Chief Complaint  Patient presents with  . Near Syncope   Says that his nausea, normal vomiting started after he fell in the hospital a week ago and does not agree with the MRI report.  Patient gets very angry and states that the "MRI report is a lie."  REVIEW OF SYSTEMS:  Review of Systems  Constitutional: Positive for malaise/fatigue. Negative for chills and fever.  HENT: Negative for sore throat.   Eyes: Negative for blurred vision and double vision.  Respiratory: Positive for cough. Negative for hemoptysis, sputum production, shortness of breath, wheezing and stridor.   Cardiovascular: Negative for chest pain, palpitations, orthopnea and leg swelling.  Gastrointestinal: Positive for nausea and vomiting. Negative for abdominal pain, blood in stool, diarrhea and melena.  Genitourinary: Negative for dysuria, flank pain and hematuria.  Musculoskeletal: Negative for back pain, joint pain and neck pain.  Skin: Negative for itching and rash.  Neurological: Negative for dizziness, sensory change, focal weakness, seizures, loss of consciousness, weakness and headaches.  Endo/Heme/Allergies: Negative for polydipsia.  Psychiatric/Behavioral: Negative for depression. The patient is not nervous/anxious.     DRUG ALLERGIES:  No Known Allergies VITALS:  Blood pressure 126/63, pulse 75, temperature 97.9 F (36.6 C), temperature source Oral, resp. rate 16, height 5\' 6"  (1.676 m), weight 65.6 kg, SpO2 96 %. PHYSICAL EXAMINATION:  Physical Exam HENT:     Head: Normocephalic.     Mouth/Throat:     Mouth: Mucous  membranes are moist.  Eyes:     General: No scleral icterus.    Conjunctiva/sclera: Conjunctivae normal.     Pupils: Pupils are equal, round, and reactive to light.  Neck:     Musculoskeletal: Normal range of motion and neck supple.     Vascular: No JVD.     Trachea: No tracheal deviation.     Comments: Positive JVD. Cardiovascular:     Rate and Rhythm: Normal rate. Rhythm irregular.     Heart sounds: Normal heart sounds. No murmur. No gallop.   Pulmonary:     Effort: Pulmonary effort is normal. No respiratory distress.     Breath sounds: No stridor. Rales present. No wheezing or rhonchi.  Abdominal:     General: Bowel sounds are normal. There is no distension.     Palpations: Abdomen is soft.     Tenderness: There is no abdominal tenderness. There is no rebound.  Musculoskeletal: Normal range of motion.        General: No tenderness.     Right lower leg: No edema.     Left lower leg: No edema.  Skin:    Findings: No erythema or rash.  Neurological:     General: No focal deficit present.     Mental Status: He is alert and oriented to person, place, and time.     Cranial Nerves: No cranial nerve deficit.  Psychiatric:        Mood and Affect: Mood normal.    LABORATORY PANEL:  Male CBC Recent Labs  Lab 05/10/19 0510  WBC 4.8  HGB 9.4*  HCT  30.7*  PLT 244   ------------------------------------------------------------------------------------------------------------------ Chemistries  Recent Labs  Lab 05/10/19 1521  NA 133*  K 6.1*  CL 91*  CO2 28  GLUCOSE 187*  BUN 68*  CREATININE 9.55*  CALCIUM 8.4*  AST 18  ALT 32  ALKPHOS 193*  BILITOT 0.3   RADIOLOGY:  No results found. ASSESSMENT AND PLAN:   Syncope- likely cough syncope, given that he passes out every time he has a severe coughing episode.  Recently hospitalized at [facility] from 8/13-8/15 and had an unremarkable syncope work-up. Continue telemetry monitor. 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  Patient needs to decrease screen time, continue rest, hydration as per neurology recommendation, postconcussion symptoms start improving after 10 days.  Patient has some left sided neck pain MRI of C-spine ordered.  Patient has no focal neurological deficit.   A. fib with RVR.  Heart rate is controlled. Amiodarone 150 mg IV bolus and started amiodarone drip per Dr. Ubaldo Glassing. Converted to p.o. amiodarone per Dr. Ubaldo Glassing.   Chronic cough- CHF or COPD. Follows with [facility] for his COPD. Recent RHC 03/30/19 showed very elevated filling pressures. -Continue Levaquin for higher doses only.  -Continue antitussives, follow sputum cultures if patient continues to have cough with sputum.  Will order sputum cultures -Continue home inhalers -Duonebs prn. Do not have patient's home regimen of cough medicine in the hospital.  I saw documentation from nurse.   -Elevated troponin- likely demand ischemia.  Patient denies any active chest pain.  EKG with new T wave inversions in the inferior leads.  Troponins are stable. History of nonischemic cardiomyopathy with EF of 45%, history of moderate mitral regurgitation, patient is on amlodipine, Entresto as per [facility] documentation but stop her Entresto secondary to hyperkalemia  Bacterial pneumonia Continue Levaquin 500 MG every 48 hours, received 750 mg 1 dose yesterday, will get 500 mg tomorrow and every 48 hours after.    Left shoulder pain- occurred after a fall. -Left shoulder x-ray: No fracture. -Gabapentin, flexeril, and tylenol prn  ESRD on HD MWF -Continue Sensipar and Phoslo, continue with hemodialysis as scheduled per Dr. Candiss Norse.  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 Chest x-ray report cardiomegaly with pulmonary vascular redistribution and mild interstitial edema. Delene Loll was discontinued due to hyperkalemia.  Continue hemodialysis. Try to wean off oxygen. Repeat echocardiogram shows worsening EF with cardiomegaly, awaiting for cardiology inputs.  Hyperkalemia.  Improved with Lokelma.  Tobacco use Smoking cessation was counseled for 3 to 4 minutes, nicotine patch prn   Newly diagnosed diabetes mellitus type 2, hemoglobin A1c 7.6, patient is refusing sliding scale insulin, says he had diabetes due to prednisone but he does have diabetes but that he keeps refusing insulin explained to him he has diabetes but he does not want to consider it is diabetes and keeps refusing insulin. . Nausea, vomiting, poor IV access, patient is requesting IV access, IVvomiting as per patient's nurse, use IV Reglan, IV Zofran, PPIs Chronic left shoulder pain patient takes oxycodone, morphine All the records are reviewed and case discussed with Care Management/Social Worker. Management plans discussed with the patient, his sister and they are in agreement.  CODE STATUS: Full Code  TOTAL TIME TAKING CARE OF THIS PATIENT: 32 minutes.   More than 50% of the time was spent in counseling/coordination of care: YES  POSSIBLE D/C IN 2 DAYS, DEPENDING ON CLINICAL CONDITION.   Epifanio Lesches M.D on 05/11/2019 at 2:05 PM  Between 7am to 6pm - Pager - [phone]  After 6pm go to [email or web address] - [facility]

## 2019-05-11 NOTE — Progress Notes (Signed)
Post HD Tx assessment Pt tolerated well the tx. UFG met 2628. Vital WDL.  BG level tested during Dialysis; 160. Provider is aware and notified. Pt reports no SOB or chest pain. Report was given to primary RN Britt Bolognese. Transport called in    05/11/19 1315  Hand-Off documentation  Report given to (Full Name) Britt Bolognese RN   Report received from (Full Name) Newt Minion RN  Vital Signs  Temp 97.9 F (36.6 C)  Temp Source Oral  Pulse Rate 75  Pulse Rate Source Monitor  Resp 16  BP 126/63  BP Location Right Arm  BP Method Automatic  Patient Position (if appropriate) Lying  Oxygen Therapy  SpO2 96 %  O2 Device Room Air  O2 Flow Rate (L/min) 2 L/min  Dialysis Weight  Weight 65.6 kg  Type of Weight Post-Dialysis  During Hemodialysis Assessment  Blood Flow Rate (mL/min) 400 mL/min  Arterial Pressure (mmHg) -150 mmHg  Venous Pressure (mmHg) 200 mmHg  Transmembrane Pressure (mmHg) 70 mmHg  Ultrafiltration Rate (mL/min) 1000 mL/min  Dialysate Flow Rate (mL/min) 800 ml/min  Conductivity: Machine  14  HD Safety Checks Performed Yes  Intra-Hemodialysis Comments Tx completed  Post-Hemodialysis Assessment  Rinseback Volume (mL) 250 mL  KECN 59.2 V  Dialyzer Clearance Lightly streaked  Duration of HD Treatment -hour(s) 3 hour(s)  Hemodialysis Intake (mL) 500 mL  UF Total -Machine (mL) 2629 mL  Net UF (mL) 2129 mL  Tolerated HD Treatment Yes  AVG/AVF Arterial Site Held (minutes) 3 minutes  AVG/AVF Venous Site Held (minutes) 5 minutes  Fistula / Graft Left Upper arm  Placement Date: 05/17/18   Placed prior to admission: Yes  Orientation: Left  Access Location: Upper arm  Site Condition No complications  Fistula / Graft Assessment Present;Thrill;Bruit  Status Deaccessed  Drainage Description None

## 2019-05-11 NOTE — Progress Notes (Signed)
Post HD Tx   05/11/19 1315  Neurological  Level of Consciousness Alert  Orientation Level Oriented X4  Respiratory  Respiratory Pattern Regular;Unlabored  Chest Assessment Chest expansion symmetrical  R Upper  Breath Sounds Clear  L Upper Breath Sounds Expiratory wheezes  R Lower Breath Sounds Expiratory wheezes  L Lower Breath Sounds Expiratory wheezes  Cardiac  Pulse Irregular  Heart Sounds S1, S2;Murmur  Jugular Venous Distention (JVD) No  Vascular  R Radial Pulse +2  L Radial Pulse +2  Edema Generalized  Generalized Edema Other (Comment) (non pitting)  Integumentary  Integumentary (WDL) X  Skin Color Other (Comment) (generalized rash)  Skin Integrity Rash  Musculoskeletal  Musculoskeletal (WDL) X  Generalized Weakness Yes  Gastrointestinal  Bowel Sounds Assessment Hypoactive  GU Assessment  Genitourinary (WDL) X  Genitourinary Symptoms Anuria (ESRD)  Psychosocial  Psychosocial (WDL) WDL

## 2019-05-11 NOTE — Progress Notes (Signed)
Pre HD assessment   05/11/19 1015  Neurological  Level of Consciousness Alert  Orientation Level Oriented X4  Respiratory  Respiratory Pattern Regular;Unlabored  Chest Assessment Chest expansion symmetrical  R Upper  Breath Sounds Clear  L Upper Breath Sounds Expiratory wheezes  R Lower Breath Sounds Expiratory wheezes  L Lower Breath Sounds Expiratory wheezes  Cough Productive  Sputum Amount Small  Sputum Color Yellow  Sputum Consistency Thick  Sputum How Obtained Oral  Cardiac  Pulse Irregular  Heart Sounds S1, S2;Murmur  Jugular Venous Distention (JVD) No  Vascular  R Radial Pulse +2  L Radial Pulse +2  Edema Generalized  Generalized Edema Other (Comment) (non pitting)  Integumentary  Integumentary (WDL) X  Skin Color Other (Comment) (generalized rash)  Skin Integrity Rash  Musculoskeletal  Musculoskeletal (WDL) X  Generalized Weakness Yes  Gastrointestinal  Bowel Sounds Assessment Hypoactive  GU Assessment  Genitourinary (WDL) X  Genitourinary Symptoms Anuria (ESRD)  Psychosocial  Psychosocial (WDL) WDL

## 2019-05-11 NOTE — Progress Notes (Signed)
Pt has been very irritable all day. He refused all blood sugar checks and threatened to leave AMA. Pt is non-compliant with using the bed alarm. Emotional support given to pt. MD made aware. Will continue monitor.

## 2019-05-11 NOTE — Progress Notes (Signed)
HD Tx Started  Prescribed BFR 400. BFR initiation rate 250   05/11/19 1030  Vital Signs  Pulse Rate 75  Pulse Rate Source Monitor  Resp 15  BP 126/76  BP Location Right Arm  BP Method Automatic  Patient Position (if appropriate) Lying  Oxygen Therapy  SpO2 99 %  O2 Device Nasal Cannula  O2 Flow Rate (L/min) 2 L/min  During Hemodialysis Assessment  Blood Flow Rate (mL/min) 250 mL/min  Arterial Pressure (mmHg) -90 mmHg  Venous Pressure (mmHg) 120 mmHg  Transmembrane Pressure (mmHg) 70 mmHg  Ultrafiltration Rate (mL/min) 1000 mL/min  Dialysate Flow Rate (mL/min) 800 ml/min  Conductivity: Machine  14.1  HD Safety Checks Performed Yes  Dialysis Fluid Bolus Normal Saline  Bolus Amount (mL) 250 mL  Intra-Hemodialysis Comments Tx initiated

## 2019-05-11 NOTE — Plan of Care (Signed)
  Problem: Clinical Measurements: Goal: Ability to maintain clinical measurements within normal limits will improve Outcome: Progressing Goal: Will remain free from infection Outcome: Progressing Goal: Diagnostic test results will improve Outcome: Progressing Goal: Respiratory complications will improve Outcome: Progressing Goal: Cardiovascular complication will be avoided Outcome: Progressing   Problem: Education: Goal: Knowledge of General Education information will improve Description: Including pain rating scale, medication(s)/side effects and non-pharmacologic comfort measures Outcome: Progressing Pt requested  3hours of HD instead  of prescribed 3.5 hours. And requested UFG of 2500 ml instead of 1500 ml. Physician is aware and notified

## 2019-05-11 NOTE — Progress Notes (Signed)
PT Cancellation Note  Patient Details Name: Jeremy [name] MRN: [id number] DOB: 1961/05/19   Cancelled Treatment:    Reason Eval/Treat Not Completed: Medical issues which prohibited therapy(Chart reviewed, no updated K+ value since 1DA, pt due for HD this date. Last available K+: 6.1, not appropriate for PT treatment. Will resume services once appropriate.)   8:35 AM, 05/11/19 Etta Grandchild, PT, DPT Physical Therapist - [facility]  [phone] ([place])    [place] C 05/11/2019, 8:35 AM

## 2019-05-11 NOTE — Consult Note (Signed)
Pharmacy Antibiotic Note  Jeremy [name] is a 58 y.o. male admitted on 04/29/2019 with pneumonia.  Pharmacy has been consulted for levofloxacin dosing. Pt on HD.  04/29/19: QTc 490 ms, pt has hx of afib. Past 24 hours, HR 70-75.  Drug interactions:  - Amiodarone & Levofloxacin: may cause QTc prolongation -Levofloxacin & Ondansetron (last dose given on 9/2):  may cause QTc prolongation  Plan: Will continue levofloxacin  500 mg q48H. Discussed during rounds to recheck QTc and awaiting order/results. Continue to monitor.     Height: 5\' 6"  (167.6 cm) Weight: 144 lb 6.4 oz (65.5 kg) IBW/kg (Calculated) : 63.8  Temp (24hrs), Avg:98.2 F (36.8 C), Min:98.2 F (36.8 C), Max:98.3 F (36.8 C)  Recent Labs  Lab 05/06/19 0645 05/10/19 0510 05/10/19 1521  WBC  --  4.8  --   CREATININE 9.16* 8.61* 9.55*    Estimated Creatinine Clearance: 7.7 mL/min (A) (by C-G formula based on SCr of 9.55 mg/dL (H)).    No Known Allergies  Antimicrobials this admission: 8/28 levofloxacin >>   Dose adjustments this admission: None  Microbiology results: None  Thank you for allowing pharmacy to be a part of this patient's care.  Rowland Lathe, PharmD 05/11/2019 8:52 AM

## 2019-05-11 NOTE — Progress Notes (Signed)
[facility]  ROUNDING NOTE   Subjective:  Patient seen and evaluated during hemodialysis. Still having some nausea and vomiting. Resting comfortably in bed at the moment.   Objective:  Vital signs in last 24 hours:  Temp:  [98.2 F (36.8 C)-98.7 F (37.1 C)] 98.7 F (37.1 C) (09/02 1015) Pulse Rate:  [70-83] 79 (09/02 1100) Resp:  [14-19] 16 (09/02 1045) BP: (126-163)/(52-76) 136/67 (09/02 1100) SpO2:  [93 %-99 %] 99 % (09/02 1100) Weight:  [65.5 kg-67 kg] 67 kg (09/02 1015)  Weight change: -7.201 kg Filed Weights   05/09/19 1155 05/11/19 0627 05/11/19 1015  Weight: 72.7 kg 65.5 kg 67 kg    Intake/Output: I/O last 3 completed shifts: In: 360 [P.O.:360] Out: 0    Intake/Output this shift:  No intake/output data recorded.  Physical Exam: General: NAD  Head: Normocephalic, atraumatic. Moist oral mucosal membranes  Eyes: Anicteric   Neck: Supple   Lungs:  CTAB, normal effort  Heart: S1S2 no rubs  Abdomen:  Soft, nontender, BS present  Extremities: no peripheral edema.  Neurologic: Nonfocal, moving all four extremities  Skin: No lesions  Access: LUE AVF    Basic Metabolic Panel: Recent Labs  Lab 05/06/19 0645 05/10/19 0510 05/10/19 1521  NA 136  --  133*  K 5.1  --  6.1*  CL 96*  --  91*  CO2 25  --  28  GLUCOSE 167*  --  187*  BUN 65*  --  68*  CREATININE 9.16* 8.61* 9.55*  CALCIUM 8.7*  --  8.4*    Liver Function Tests: Recent Labs  Lab 05/10/19 1521  AST 18  ALT 32  ALKPHOS 193*  BILITOT 0.3  PROT 6.6  ALBUMIN 2.9*   No results for input(s): LIPASE, AMYLASE in the last 168 hours. No results for input(s): AMMONIA in the last 168 hours.  CBC: Recent Labs  Lab 05/10/19 0510  WBC 4.8  HGB 9.4*  HCT 30.7*  MCV 95.9  PLT 244    Cardiac Enzymes: No results for input(s): CKTOTAL, CKMB, CKMBINDEX, TROPONINI in the last 168 hours.  BNP: Invalid input(s): POCBNP  CBG: Recent Labs  Lab 05/09/19 0729 05/09/19 1127  05/09/19 1644 05/10/19 0800 05/10/19 1152  GLUCAP 168* 86 172* 165* 174*    Microbiology: Results for orders placed or performed during the hospital encounter of 04/29/19  SARS Coronavirus 2 [facility] order, Performed in [facility] hospital lab) Nasopharyngeal Nasopharyngeal Swab     Status: None   Collection Time: 04/29/19  6:03 PM   Specimen: Nasopharyngeal Swab  Result Value Ref Range Status   SARS Coronavirus 2 NEGATIVE NEGATIVE Final    Comment: (NOTE) If result is NEGATIVE SARS-CoV-2 target nucleic acids are NOT DETECTED. The SARS-CoV-2 RNA is generally detectable in upper and lower  respiratory specimens during the acute phase of infection. The lowest  concentration of SARS-CoV-2 viral copies this assay can detect is 250  copies / mL. A negative result does not preclude SARS-CoV-2 infection  and should not be used as the sole basis for treatment or other  patient management decisions.  A negative result may occur with  improper specimen collection / handling, submission of specimen other  than nasopharyngeal swab, presence of viral mutation(s) within the  areas targeted by this assay, and inadequate number of viral copies  (<250 copies / mL). A negative result must be combined with clinical  observations, patient history, and epidemiological information. If result is POSITIVE SARS-CoV-2 target nucleic  acids are DETECTED. The SARS-CoV-2 RNA is generally detectable in upper and lower  respiratory specimens dur ing the acute phase of infection.  Positive  results are indicative of active infection with SARS-CoV-2.  Clinical  correlation with patient history and other diagnostic information is  necessary to determine patient infection status.  Positive results do  not rule out bacterial infection or co-infection with other viruses. If result is PRESUMPTIVE POSTIVE SARS-CoV-2 nucleic acids MAY BE PRESENT.   A presumptive positive result was obtained on the submitted specimen   and confirmed on repeat testing.  While 2019 novel coronavirus  (SARS-CoV-2) nucleic acids may be present in the submitted sample  additional confirmatory testing may be necessary for epidemiological  and / or clinical management purposes  to differentiate between  SARS-CoV-2 and other Sarbecovirus currently known to infect humans.  If clinically indicated additional testing with an alternate test  methodology [phone]) is advised. The SARS-CoV-2 RNA is generally  detectable in upper and lower respiratory sp ecimens during the acute  phase of infection. The expected result is Negative. Fact Sheet for Patients:  StrictlyIdeas.no Fact Sheet for Healthcare Providers: BankingDealers.co.za This test is not yet approved or cleared by the [place] FDA and has been authorized for detection and/or diagnosis of SARS-CoV-2 by FDA under an Emergency Use Authorization (EUA).  This EUA will remain in effect (meaning this test can be used) for the duration of the COVID-19 declaration under Section 564(b)(1) of the Act, 21 U.S.C. section 360bbb-3(b)(1), unless the authorization is terminated or revoked sooner. Performed at [facility], [address] [place], [place] [address]   Bordetella pertussis PCR     Status: None   Collection Time: 04/29/19 11:27 PM   Specimen: Nasopharyngeal Swab  Result Value Ref Range Status   B pertussis, DNA Negative Negative Final   B parapertussis, DNA Negative Negative Final    Comment: (NOTE) This test was developed and its performance characteristics determined by [employer]. It has not been cleared or approved by the U.S. Food and Drug Administration. The FDA has determined that such clearance or approval is not necessary. This test is used for clinical purposes. It should not be regarded as investigational or research. Performed At: [facility] [place], [place]  [id number] Rush Farmer MD [id number]     Coagulation Studies: No results for input(s): LABPROT, INR in the last 72 hours.  Urinalysis: No results for input(s): COLORURINE, LABSPEC, PHURINE, GLUCOSEU, HGBUR, BILIRUBINUR, KETONESUR, PROTEINUR, UROBILINOGEN, NITRITE, LEUKOCYTESUR in the last 72 hours.  Invalid input(s): APPERANCEUR    Imaging: Mr Brain Wo Contrast  Result Date: 05/10/2019 CLINICAL DATA:  Vertigo, persistent, central. Additional history: Patient complains of nausea EXAM: MRI HEAD WITHOUT CONTRAST TECHNIQUE: Multiplanar, multiecho pulse sequences of the brain and surrounding structures were obtained without intravenous contrast. COMPARISON:  Head CT 05/03/2019 FINDINGS: Brain: Multiple sequences are motion degraded. No restricted diffusion is demonstrated to suggest acute or recent subacute infarction. No evidence of intracranial mass. No midline shift or extra-axial fluid collection. Mild scattered T2/FLAIR hyperintensity within the cerebral white matter is nonspecific, but consistent with chronic small vessel ischemic disease. Small chronic lacunar infarcts within the left corona radiata/basal ganglia, left caudate nucleus and superior left cerebellum. Additionally, a small focus of chronic encephalomalacia is questioned within the inferomedial right cerebellum (series 15, image 12). Punctate chronic microhemorrhage within the right periatrial white matter. Parenchymal volume is age-appropriate. Vascular: Flow voids maintained within the proximal large arterial vessels. Skull  and upper cervical spine: Normal calvarial marrow signal. Nonspecific T1 hypointense marrow signal within the visualized cervical spine. Sinuses/Orbits: Imaged globes and orbits demonstrate no acute abnormality. Mild scattered paranasal sinus mucosal thickening. Left mastoid effusion. IMPRESSION: Motion degraded exam. No evidence of acute intracranial abnormality. A small focus of chronic encephalomalacia is  questioned within the inferomedial right cerebellum, indeterminate in etiology. Mild chronic small vessel ischemic disease with small chronic lacunar infarcts in the left corona radiata/basal ganglia, left caudate nucleus and superior left cerebellum. Left mastoid effusion. T1 hypointense marrow signal within the visualized cervical spine. Findings are nonspecific but may be seen in the setting of chronic anemia or a marrow infiltrative process. Clinical correlation is recommended. Electronically Signed   By: Kellie Simmering   On: 05/10/2019 10:23     Medications:    . amiodarone  200 mg Oral BID  . benzonatate  200 mg Oral TID  . calcium acetate  667 mg Oral TID WC  . Chlorhexidine Gluconate Cloth  6 each Topical Daily  . cinacalcet  30 mg Oral 3 times weekly  . epoetin (EPOGEN/PROCRIT) injection  4,000 Units Intravenous Q M,W,F-HD  . feeding supplement (NEPRO CARB STEADY)  237 mL Oral BID BM  . fluticasone  2 spray Each Nare Daily  . heparin  5,000 Units Subcutaneous Q8H  . insulin aspart  0-9 Units Subcutaneous TID WC  . levofloxacin  500 mg Oral Q48H  . living well with diabetes book   Does not apply Once  . multivitamin  1 tablet Oral QHS  . pantoprazole  40 mg Oral Daily  . polyvinyl alcohol  1 drop Right Eye TID  . sodium zirconium cyclosilicate  10 g Oral Daily  . tobramycin  1 drop Both Eyes Q6H  . umeclidinium-vilanterol  1 puff Inhalation Daily   acetaminophen **OR** acetaminophen, albuterol, cyclobenzaprine, diphenhydrAMINE, gabapentin, guaiFENesin, guaiFENesin-codeine, ipratropium-albuterol, morphine injection, ondansetron **OR** ondansetron (ZOFRAN) IV, oxyCODONE, polyethylene glycol  Assessment/ Plan:  Jeremy [name] is a 58 y.o. black male with end stage renal disease on hemodialysis, hypertension, congestive heart failure, diabetes mellitus type II, history of substance abuse, COPD with ongoing tobacco use  [facility] MWF 65kg 180 minutes 2K bath  1.  End stage renal disease with hyperkalemia: Patient seen and evaluated during hemodialysis.  Tolerating well.  Serum potassium high at 6.1.  Maintain the patient on sodium zirconium as well.  Also dialyze against a 2K bath.  2. Syncope: diagnosed as post-tussive syncope at [facility].   Hypotensive on this admission.  Atrial fibrillation with rapid ventricular response treated with amiodarone.   - cardiology team following  3. Anemia of chronic kidney disease:  -  Lab Results  Component Value Date   HGB 9.4 (L) 05/10/2019   -Patient to receive Epogen 4000 IV with dialysis today.  4. Secondary Hyperparathyroidism:  Lab Results  Component Value Date   PTH 708 (H) 11/24/2018   CALCIUM 8.4 (L) 05/10/2019   PHOS 6.3 (H) 11/24/2018   -Repeat serum phosphorus today.   LOS: 11 Romelia Bromell 9/2/202011:18 AM

## 2019-05-11 NOTE — Progress Notes (Signed)
Pre HDTx assessment  Pt refuses to have  EKJ Electrodes on his chest due to ogeneralized skin rash   05/11/19 1015  Hand-Off documentation  Report given to (Full Name) Newt Minion RN  Report received from (Full Name) LindsayMartinRN  Vital Signs  Temp 98.7 F (37.1 C)  Temp Source Oral  Pulse Rate 83  Pulse Rate Source Monitor  Resp 16  BP (!) 145/63  BP Location Right Arm  BP Method Automatic  Patient Position (if appropriate) Lying  Oxygen Therapy  SpO2 99 %  O2 Device Nasal Cannula  O2 Flow Rate (L/min) 2 L/min  Pain Assessment  Pain Scale 0-10  Pain Score 0  Dialysis Weight  Weight 67 kg  Type of Weight Pre-Dialysis  Time-Out for Hemodialysis  What Procedure? HD  Pt Identifiers(min of two) First/Last Name;MRN/Account#  Correct Site? Yes  Correct Side? Yes  Correct Procedure? Yes  Consents Verified? Yes  Safety Precautions Reviewed? Yes  Engineer, civil (consulting) Number 3  Station Number 3  UF/Alarm Test Passed  Conductivity: Meter 13.7  Conductivity: Machine  14.1  pH 7.4  Reverse Osmosis Main  Normal Saline Lot Number [id number]  Dialyzer Lot Number [id number]  Disposable Set Lot Number [id number]  Machine Temperature 98.6 F (37 C)  Musician and Audible Yes  Blood Lines Intact and Secured Yes  Pre Treatment Patient Checks  Vascular access used during treatment Fistula  HD catheter dressing before treatment WDL  Patient is receiving dialysis in a chair  (inbed)  Hepatitis B Surface Antigen Results Negative  Date Hepatitis B Surface Antigen Drawn 07/09/18  Hepatitis B Surface Antibody  (>10Immune)  Date Hepatitis B Surface Antibody Drawn 07/09/18  Hemodialysis Consent Verified Yes  Hemodialysis Standing Orders Initiated Yes  ECG (Telemetry) Monitor On No (Pt refuses duetogeneralized skin rash)  Prime Ordered Normal Saline  Length of  DialysisTreatment -hour(s) 3.5 Hour(s) (Pt requested only3hours)  Dialysis Treatment Comments  (Na140)  Dialyzer  Elisio 17H NR  Dialysis Anticoagulant None  Dialysate Flow Ordered 800  Blood Flow Rate Ordered 400 mL/min  Ultrafiltration Goal 1.5 Liters (Ptrequested  2500)  Fistula / Graft Left Upper arm  Placement Date: 05/17/18   Placed prior to admission: Yes  Orientation: Left  Access Location: Upper arm  Site Condition No complications  Fistula / Graft Assessment Present;Thrill;Bruit  Status Accessed  Needle Size 15  Drainage Description None

## 2019-05-11 NOTE — Consult Note (Addendum)
Reason for Consult:vomiting Referring Physician: Dr. Vianne Bulls   CC: n/v  HPI: Jeremy [name] is an 58 y.o. male  history of ESRD on HD MWF and hypertension who presented to the ED with cough and syncope over the last 5 months. Presented on 8/27.  Pt has been having persistent n/v along with history of fall.  S/p MRI patient has chronic ischemia, small lacunar infarcts and encephalomalacia but no acute abnormalities.   Past Medical History:  Diagnosis Date  . Bronchitis   . Hypertension   . Renal disorder    kidney failure, dialysis    Past Surgical History:  Procedure Laterality Date  . DIALYSIS FISTULA CREATION      Family History  Problem Relation Age of Onset  . Hypertension Mother     Social History:  reports that he has been smoking cigarettes. He has been smoking about 1.00 pack per day. He has never used smokeless tobacco. He reports current drug use. Drug: Marijuana. He reports that he does not drink alcohol.  No Known Allergies  Medications: I have reviewed the patient's current medications.  ROS: History obtained from the patient  General ROS: negative for - chills, fatigue, fever, night sweats, weight gain or weight loss Psychological ROS: negative for - behavioral disorder, hallucinations, memory difficulties, mood swings or suicidal ideation Ophthalmic ROS: negative for - blurry vision, double vision, eye pain or loss of vision ENT ROS: negative for - epistaxis, nasal discharge, oral lesions, sore throat, tinnitus or vertigo Allergy and Immunology ROS: negative for - hives or itchy/watery eyes Hematological and Lymphatic ROS: negative for - bleeding problems, bruising or swollen lymph nodes Endocrine ROS: negative for - galactorrhea, hair pattern changes, polydipsia/polyuria or temperature intolerance Respiratory ROS: negative for - cough, hemoptysis, shortness of breath or wheezing Cardiovascular ROS: negative for - chest pain, dyspnea on exertion, edema or  irregular heartbeat Gastrointestinal ROS: negative for - abdominal pain, diarrhea, hematemesis, nausea/vomiting or stool incontinence Genito-Urinary ROS: negative for - dysuria, hematuria, incontinence or urinary frequency/urgency Musculoskeletal ROS: negative for - joint swelling or muscular weakness Neurological ROS: as noted in HPI Dermatological ROS: negative for rash and skin lesion changes  Physical Examination: Blood pressure (!) 154/75, pulse 82, temperature 98.7 F (37.1 C), temperature source Oral, resp. rate 16, height 5\' 6"  (1.676 m), weight 67 kg, SpO2 100 %.    Neurological Examination   Mental Status: Alert, oriented, thought content appropriate.  Speech fluent without evidence of aphasia.  Able to follow 3 step commands without difficulty. Cranial Nerves: II: Discs flat bilaterally; Visual fields grossly normal, pupils equal, round, reactive to light and accommodation III,IV, VI: ptosis not present, extra-ocular motions intact bilaterally V,VII: smile symmetric, facial light touch sensation normal bilaterally VIII: hearing normal bilaterally IX,X: gag reflex present XI: bilateral shoulder shrug XII: midline tongue extension Motor: Right : Upper extremity   5/5    Left:     Upper extremity   5/5  Lower extremity   5/5     Lower extremity   5/5 Tone and bulk:normal tone throughout; no atrophy noted Sensory: Pinprick and light touch intact throughout, bilaterally Deep Tendon Reflexes: 1+ and symmetric throughout Plantars: Right: downgoing   Left: downgoing Cerebellar: Not tested Gait: not tested      Laboratory Studies:   Basic Metabolic Panel: Recent Labs  Lab 05/06/19 0645 05/10/19 0510 05/10/19 1521  NA 136  --  133*  K 5.1  --  6.1*  CL 96*  --  91*  CO2 25  --  28  GLUCOSE 167*  --  187*  BUN 65*  --  68*  CREATININE 9.16* 8.61* 9.55*  CALCIUM 8.7*  --  8.4*    Liver Function Tests: Recent Labs  Lab 05/10/19 1521  AST 18  ALT 32  ALKPHOS  193*  BILITOT 0.3  PROT 6.6  ALBUMIN 2.9*   No results for input(s): LIPASE, AMYLASE in the last 168 hours. No results for input(s): AMMONIA in the last 168 hours.  CBC: Recent Labs  Lab 05/10/19 0510  WBC 4.8  HGB 9.4*  HCT 30.7*  MCV 95.9  PLT 244    Cardiac Enzymes: No results for input(s): CKTOTAL, CKMB, CKMBINDEX, TROPONINI in the last 168 hours.  BNP: Invalid input(s): POCBNP  CBG: Recent Labs  Lab 05/09/19 0729 05/09/19 1127 05/09/19 1644 05/10/19 0800 05/10/19 1152  GLUCAP 168* 86 172* 165* 174*    Microbiology: Results for orders placed or performed during the hospital encounter of 04/29/19  SARS Coronavirus 2 [facility] order, Performed in [facility] hospital lab) Nasopharyngeal Nasopharyngeal Swab     Status: None   Collection Time: 04/29/19  6:03 PM   Specimen: Nasopharyngeal Swab  Result Value Ref Range Status   SARS Coronavirus 2 NEGATIVE NEGATIVE Final    Comment: (NOTE) If result is NEGATIVE SARS-CoV-2 target nucleic acids are NOT DETECTED. The SARS-CoV-2 RNA is generally detectable in upper and lower  respiratory specimens during the acute phase of infection. The lowest  concentration of SARS-CoV-2 viral copies this assay can detect is 250  copies / mL. A negative result does not preclude SARS-CoV-2 infection  and should not be used as the sole basis for treatment or other  patient management decisions.  A negative result may occur with  improper specimen collection / handling, submission of specimen other  than nasopharyngeal swab, presence of viral mutation(s) within the  areas targeted by this assay, and inadequate number of viral copies  (<250 copies / mL). A negative result must be combined with clinical  observations, patient history, and epidemiological information. If result is POSITIVE SARS-CoV-2 target nucleic acids are DETECTED. The SARS-CoV-2 RNA is generally detectable in upper and lower  respiratory specimens dur ing the  acute phase of infection.  Positive  results are indicative of active infection with SARS-CoV-2.  Clinical  correlation with patient history and other diagnostic information is  necessary to determine patient infection status.  Positive results do  not rule out bacterial infection or co-infection with other viruses. If result is PRESUMPTIVE POSTIVE SARS-CoV-2 nucleic acids MAY BE PRESENT.   A presumptive positive result was obtained on the submitted specimen  and confirmed on repeat testing.  While 2019 novel coronavirus  (SARS-CoV-2) nucleic acids may be present in the submitted sample  additional confirmatory testing may be necessary for epidemiological  and / or clinical management purposes  to differentiate between  SARS-CoV-2 and other Sarbecovirus currently known to infect humans.  If clinically indicated additional testing with an alternate test  methodology [phone]) is advised. The SARS-CoV-2 RNA is generally  detectable in upper and lower respiratory sp ecimens during the acute  phase of infection. The expected result is Negative. Fact Sheet for Patients:  StrictlyIdeas.no Fact Sheet for Healthcare Providers: BankingDealers.co.za This test is not yet approved or cleared by the [place] FDA and has been authorized for detection and/or diagnosis of SARS-CoV-2 by FDA under an Emergency Use Authorization (EUA).  This EUA will remain in effect (meaning  this test can be used) for the duration of the COVID-19 declaration under Section 564(b)(1) of the Act, 21 U.S.C. section 360bbb-3(b)(1), unless the authorization is terminated or revoked sooner. Performed at [facility], [address] [place], [place] [address]   Bordetella pertussis PCR     Status: None   Collection Time: 04/29/19 11:27 PM   Specimen: Nasopharyngeal Swab  Result Value Ref Range Status   B pertussis, DNA Negative Negative Final   B parapertussis,  DNA Negative Negative Final    Comment: (NOTE) This test was developed and its performance characteristics determined by [employer]. It has not been cleared or approved by the U.S. Food and Drug Administration. The FDA has determined that such clearance or approval is not necessary. This test is used for clinical purposes. It should not be regarded as investigational or research. Performed At: [facility] [place], [place] [id number] Rush Farmer MD [id number]     Coagulation Studies: No results for input(s): LABPROT, INR in the last 72 hours.  Urinalysis: No results for input(s): COLORURINE, LABSPEC, PHURINE, GLUCOSEU, HGBUR, BILIRUBINUR, KETONESUR, PROTEINUR, UROBILINOGEN, NITRITE, LEUKOCYTESUR in the last 168 hours.  Invalid input(s): APPERANCEUR  Lipid Panel:  No results found for: CHOL, TRIG, HDL, CHOLHDL, VLDL, LDLCALC  HgbA1C:  Lab Results  Component Value Date   HGBA1C 7.6 (H) 05/02/2019    Urine Drug Screen:  No results found for: LABOPIA, COCAINSCRNUR, LABBENZ, AMPHETMU, THCU, LABBARB  Alcohol Level: No results for input(s): ETH in the last 168 hours.  Other results: EKG: normal EKG, normal sinus rhythm, unchanged from previous tracings.  Imaging: Mr Brain Wo Contrast  Result Date: 05/10/2019 CLINICAL DATA:  Vertigo, persistent, central. Additional history: Patient complains of nausea EXAM: MRI HEAD WITHOUT CONTRAST TECHNIQUE: Multiplanar, multiecho pulse sequences of the brain and surrounding structures were obtained without intravenous contrast. COMPARISON:  Head CT 05/03/2019 FINDINGS: Brain: Multiple sequences are motion degraded. No restricted diffusion is demonstrated to suggest acute or recent subacute infarction. No evidence of intracranial mass. No midline shift or extra-axial fluid collection. Mild scattered T2/FLAIR hyperintensity within the cerebral white matter is nonspecific, but consistent with chronic small vessel  ischemic disease. Small chronic lacunar infarcts within the left corona radiata/basal ganglia, left caudate nucleus and superior left cerebellum. Additionally, a small focus of chronic encephalomalacia is questioned within the inferomedial right cerebellum (series 15, image 12). Punctate chronic microhemorrhage within the right periatrial white matter. Parenchymal volume is age-appropriate. Vascular: Flow voids maintained within the proximal large arterial vessels. Skull and upper cervical spine: Normal calvarial marrow signal. Nonspecific T1 hypointense marrow signal within the visualized cervical spine. Sinuses/Orbits: Imaged globes and orbits demonstrate no acute abnormality. Mild scattered paranasal sinus mucosal thickening. Left mastoid effusion. IMPRESSION: Motion degraded exam. No evidence of acute intracranial abnormality. A small focus of chronic encephalomalacia is questioned within the inferomedial right cerebellum, indeterminate in etiology. Mild chronic small vessel ischemic disease with small chronic lacunar infarcts in the left corona radiata/basal ganglia, left caudate nucleus and superior left cerebellum. Left mastoid effusion. T1 hypointense marrow signal within the visualized cervical spine. Findings are nonspecific but may be seen in the setting of chronic anemia or a marrow infiltrative process. Clinical correlation is recommended. Electronically Signed   By: Kellie Simmering   On: 05/10/2019 10:23     Assessment/Plan: 58 y.o. male  history of ESRD on HD MWF and hypertension who presented to the ED with cough and syncope over the last 5 months. Presented  on 8/27.  Pt has been having persistent n/v along with history of fall.  S/p MRI patient has chronic ischemia, small lacunar infarcts and encephalomalacia but no acute abnormalities.   - Very possible post concussive symptoms but really not specific.  Concussion is usually associated with visual disturbances and headaches which he doesn't  have  - No clear focality on neurological examination  - Even if post concussion not true treatment outside of symptomatic treatment which patient is on.  - Other treatment includes rest, hydration, reducing screen time.  - Post concussion symptoms usually start improving after 10 days.   - Pt is complaining of L sided neck pain sharp and not position related. He had C spine CT on 8/21 and he has significant c4-c7 degenerative disease.  Will obtain MRI C spine to  sure no  Further injury post fall.   - At this point all we can do is symptomatic treatment.

## 2019-05-12 ENCOUNTER — Inpatient Hospital Stay: Payer: Medicare Other

## 2019-05-12 MED ORDER — ASPIRIN EC 325 MG PO TBEC
325.0000 mg | DELAYED_RELEASE_TABLET | Freq: Once | ORAL | Status: AC
  Administered 2019-05-12: 06:00:00 325 mg via ORAL
  Filled 2019-05-12: qty 1

## 2019-05-12 MED ORDER — METOCLOPRAMIDE HCL 5 MG/ML IJ SOLN
5.0000 mg | Freq: Three times a day (TID) | INTRAMUSCULAR | Status: DC
  Administered 2019-05-12 – 2019-05-13 (×4): 5 mg via INTRAVENOUS
  Filled 2019-05-12 (×4): qty 2

## 2019-05-12 MED ORDER — PANTOPRAZOLE SODIUM 40 MG IV SOLR
40.0000 mg | Freq: Two times a day (BID) | INTRAVENOUS | Status: DC
  Administered 2019-05-12 (×2): 40 mg via INTRAVENOUS
  Filled 2019-05-12 (×2): qty 40

## 2019-05-12 NOTE — Progress Notes (Signed)
[facility] [place] NAME: Jeremy [name]    MR#:  [id number]  DATE OF BIRTH:  December 18, 1960  Patient is asking to eat regular food.  Wants to sign out AMA.  Refusing to take insulin even though patient is diabetic with hemoglobin A1c more than 7.  CHIEF COMPLAINT:   Chief Complaint  Patient presents with  . Near Syncope   Says that his nausea, normal vomiting started after he fell in the hospital a week ago and does not agree with the MRI report.  Patient gets very angry and states that the "MRI report is a lie." However has no nausea or vomiting today and wants to eat regular food and says he is hungry and no more nausea today.  REVIEW OF SYSTEMS:  Review of Systems  Constitutional: Positive for malaise/fatigue. Negative for chills and fever.  HENT: Negative for sore throat.   Eyes: Negative for blurred vision and double vision.  Respiratory: Positive for cough. Negative for hemoptysis, sputum production, shortness of breath, wheezing and stridor.   Cardiovascular: Negative for chest pain, palpitations, orthopnea and leg swelling.  Gastrointestinal: Positive for nausea and vomiting. Negative for abdominal pain, blood in stool, diarrhea and melena.  Genitourinary: Negative for dysuria, flank pain and hematuria.  Musculoskeletal: Negative for back pain, joint pain and neck pain.  Skin: Negative for itching and rash.  Neurological: Negative for dizziness, sensory change, focal weakness, seizures, loss of consciousness, weakness and headaches.  Endo/Heme/Allergies: Negative for polydipsia.  Psychiatric/Behavioral: Negative for depression. The patient is not nervous/anxious.     DRUG ALLERGIES:  No Known Allergies VITALS:  Blood pressure (!) 144/62, pulse 76, temperature 98.9 F (37.2 C), temperature source Oral, resp. rate 18, height 5\' 6"  (1.676 m), weight 65.6 kg, SpO2 97 %. PHYSICAL EXAMINATION:  Physical Exam HENT:     Head:  Normocephalic.     Mouth/Throat:     Mouth: Mucous membranes are moist.  Eyes:     General: No scleral icterus.    Conjunctiva/sclera: Conjunctivae normal.     Pupils: Pupils are equal, round, and reactive to light.  Neck:     Musculoskeletal: Normal range of motion and neck supple.     Vascular: No JVD.     Trachea: No tracheal deviation.     Comments: Positive JVD. Cardiovascular:     Rate and Rhythm: Normal rate. Rhythm irregular.     Heart sounds: Normal heart sounds. No murmur. No gallop.   Pulmonary:     Effort: Pulmonary effort is normal. No respiratory distress.     Breath sounds: No stridor. No wheezing, rhonchi or rales.  Abdominal:     General: Bowel sounds are normal. There is no distension.     Palpations: Abdomen is soft.     Tenderness: There is no abdominal tenderness. There is no rebound.  Musculoskeletal: Normal range of motion.        General: No tenderness.     Right lower leg: No edema.     Left lower leg: No edema.  Skin:    Findings: No erythema or rash.  Neurological:     General: No focal deficit present.     Mental Status: He is alert and oriented to person, place, and time.     Cranial Nerves: No cranial nerve deficit.  Psychiatric:        Mood and Affect: Mood normal.    LABORATORY PANEL:  Male CBC Recent Labs  Lab 05/10/19 0510  WBC 4.8  HGB 9.4*  HCT 30.7*  PLT 244   ------------------------------------------------------------------------------------------------------------------ Chemistries  Recent Labs  Lab 05/10/19 1521 05/11/19 1535  NA 133*  --   K 6.1* 4.6  CL 91*  --   CO2 28  --   GLUCOSE 187*  --   BUN 68*  --   CREATININE 9.55*  --   CALCIUM 8.4*  --   AST 18  --   ALT 32  --   ALKPHOS 193*  --   BILITOT 0.3  --    RADIOLOGY:  Mr Cervical Spine Wo Contrast  Result Date: 05/11/2019 CLINICAL DATA:  Initial evaluation for left-sided neck pain, recent fall 10 days ago. EXAM: MRI CERVICAL SPINE WITHOUT CONTRAST  TECHNIQUE: Multiplanar, multisequence MR imaging of the cervical spine was performed. No intravenous contrast was administered. COMPARISON:  Prior CT from 04/29/2019. FINDINGS: Alignment: Straightening with mild reversal of the normal cervical lordosis, apex at C4-5. No listhesis. Vertebrae: Vertebral body height maintained without evidence for acute or chronic fracture. Prominent endplate Schmorl's node with associated mild reactive marrow edema noted at the superior endplate of C7, also seen on prior CT. Reactive endplate changes present about the C4-5 and C5-6 interspaces as well. Mild marrow edema about the right C3-4 and left C4-5 facets due to facet arthritis. Underlying bone marrow signal intensity diffusely decreased on T1 weighted imaging, nonspecific, but suspected to be related to patient's history of anemia or smoking. No discrete or worrisome osseous lesions. Cord: Signal intensity within the cervical spinal cord is within normal limits. Posterior Fossa, vertebral arteries, paraspinal tissues: Visualized brain and posterior fossa normal. Craniocervical junction within normal limits. Mild diffuse prevertebral edema extending from the skull base through approximately C7-T1 (series 7, image 7). Finding is nonspecific, but could reflect sequelae of low-grade ligamentous injury/strain given history of recent fall. No other findings to suggest acute infection within the cervical spine which would be the main differential consideration. Normal intravascular flow voids seen within the left vertebral artery. Right vertebral artery markedly hypoplastic and not well seen. Disc levels: C2-C3: Negative interspace. Mild left-sided facet hypertrophy. No canal or foraminal stenosis. C3-C4: Diffuse degenerative disc osteophyte with intervertebral disc space narrowing. Bilateral facet degeneration. Flattening of the ventral thecal sac with resultant mild spinal stenosis. Severe right with moderate left C4 foraminal  stenosis. C4-C5: Diffuse degenerative disc osteophyte with intervertebral disc space narrowing. Broad posterior component effaces the ventral thecal sac resultant moderate spinal stenosis. Mild cord flattening without cord signal changes. Severe bilateral C5 foraminal narrowing, worse on the right. C5-C6: Diffuse disc osteophyte with intervertebral disc space narrowing. Broad posterior component flattens and effaces the ventral thecal sac, with a slightly more focal left paracentral component (series 8, image 17). Resultant mild spinal stenosis without significant cord deformity. Severe right with moderate left C6 foraminal stenosis. C6-C7: Mild disc bulge with uncovertebral hypertrophy. Disc bulging asymmetric to the left. Mild bilateral facet hypertrophy. Resultant mild spinal stenosis with moderate left greater than right C7 foraminal narrowing. C7-T1: Negative interspace. Right greater than left facet hypertrophy. No stenosis. Visualized upper thoracic spine demonstrates no significant finding. IMPRESSION: 1. Mild diffuse prevertebral edema extending from the skull base through C7-T1. Finding is nonspecific, but could reflect the sequelae of low-grade ligamentous injury/strain given the history of recent fall. No other evidence for acute infection within the cervical spine which would be the main differential consideration. No other acute traumatic injury identified. 2. Moderate multilevel cervical spondylolysis with  resultant mild to moderate diffuse spinal stenosis at C3-4 through C6-7, most pronounced at C4-5. 3. Multifactorial degenerative changes with resultant moderate to severe bilateral foraminal narrowing at C4 through C7 as above. 4. Prominent facet degeneration with associated mild reactive marrow edema about the left C3-4 and right C4-5 facets, which could contribute to underlying neck pain. 5. Diffusely decreased T1 signal within the visualized bone marrow, nonspecific, but suspected to related to  patient's known history of anemia or smoking. Electronically Signed   By: Jeannine Boga M.D.   On: 05/11/2019 23:36   ASSESSMENT AND PLAN:   Syncope- likely cough syncope, given that he passes out every time he has a severe coughing episode.  Recently hospitalized at [facility] from 8/13-8/15 and had an unremarkable syncope work-up. Continue telemetry monitor. Nausea, vomiting, history of fall, MRI of the brain t shows encephalomalacia, small chronic lacunar infarct in the left caudate nucleus, corona radiata, patient is seen by neurology ,, very possible postconcussive symptoms but really nonspecific.  Patient needs to decrease screen time, continue rest, hydration as per neurology recommendation, postconcussion symptoms start improving after 10 days.  Patient has some left sided neck pain MRI of C-spine ordered.  Patient has no focal neurological deficit. Cervical spine MRI showed mild diffuse prevertebral edema at C7 and T1, can be nonspecific/lower ligamentous injury/strain due to recent fall, requested neurosurgical input, epic text message them.  Will put neurosurgery consult as well.  Patient also has moderate multilevel cervical spondylosis.  Has left shoulder pain but no tingling or numbness in both hands,   A. fib with RVR.  H heart rate is controlled, continue amiodarone.  Chronic cough- CHF or COPD. Follows with [facility] for his COPD. Recent RHC 03/30/19 showed very elevated filling pressures. -  -Bacterial pneumonia, patient is on Levaquin, follow sputum cultures if patient is able to give.  Continue Levaquin every 48 hours.  Patient is not hypoxic .  No fever, no leukocytosis.   -Elevated troponin- likely demand ischemia.  Patient denies any active chest pain.  EKG with new T wave inversions in the inferior leads.  Troponins are stable. History of nonischemic cardiomyopathy with EF of 45%, history of moderate mitral regurgitation, patient is on amlodipine, Entresto as per  [facility] documentation but stop her Entresto secondary to hyperkalemia   Left shoulder pain- occurred after a fall. -Left shoulder x-ray: No fracture. -Gabapentin, flexeril, and tylenol prn    ESRD on HD MWF -Continue Sensipar and Phoslo, continue with hemodialysis as scheduled as per nephrology.  Patient requests regular food even though he is diabetic and has hypertension, ESRD and heart failure.   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 Chest x-ray report cardiomegaly with pulmonary vascular redistribution and mild interstitial edema.  Acute respiratory failure improved, oxygen saturation 96%   .  Delene Loll was discontinued due to hyperkalemia.  Continue hemodialysis.  Repeat echocardiogram shows worsening EF with cardiomegaly,    Hyperkalemia.  Improved with Lokelma.  Tobacco use Smoking cessation was counseled for 3 to 4 minutes, nicotine patch prn   Newly diagnosed diabetes mellitus type 2, hemoglobin A1c 7.6, patient is refusing sliding scale insulin, says he had diabetes due to prednisone but he does have diabetes but that he keeps refusing insulin explained to him he has diabetes but he does not want to consider it is diabetes and keeps refusing  insulin. . Nausea, vomiting, continue IV PPIs, GI consult requested.    Chronic left shoulder pain patient takes oxycodone, morphine  Plan' ; neurosurgery consulted because of C7 and T1 edema, Patient wanted to eat regular food for breakfast only 1 time, from lunch onwards patient is to be on renal diet.    wAll the records are reviewed and case discussed with Care Management/Social Worker. Management plans discussed with the patient, his sister and they are in agreement.  CODE STATUS: Full Code  TOTAL TIME TAKING CARE OF THIS PATIENT: 38 minutes.   More than 50% of  the time was spent in counseling/coordination of care: YES  POSSIBLE D/C IN 2 DAYS, DEPENDING ON CLINICAL CONDITION.   Epifanio Lesches M.D on 05/12/2019 at 12:20 PM  Between 7am to 6pm - Pager - [phone]  After 6pm go to [email or web address] - [facility]

## 2019-05-12 NOTE — Progress Notes (Signed)
PT Cancellation Note  Patient Details Name: Jeremy [name] MRN: [id number] DOB: 1961-04-07   Cancelled Treatment:    Reason Eval/Treat Not Completed: Other (comment)(Chart reviewed, pt seen at bedside. Pt requests he eat first prior to PT, has had N/V for days, and now very hungry. Author returns at 12:05, pt sleeping heavily. Will make another attempt this date time permitting.)  [id number] PM, 05/12/19 Etta Grandchild, PT, DPT Physical Therapist - [facility]  [phone] ([place])    [place] C 05/12/2019, 12:56 PM

## 2019-05-12 NOTE — Progress Notes (Signed)
Patient keeps refusing to wear telemetry monitor

## 2019-05-12 NOTE — Progress Notes (Signed)
PT Cancellation Note  Patient Details Name: Jeremy [name] MRN: [id number] DOB: 16-Sep-1960   Cancelled Treatment:    Reason Eval/Treat Not Completed: Patient declined, no reason specified Attempted to see pt again this afternoon, he reports "You're a little too late man, I was just up walking to the nurses' station."  Pt states that he did fatigue with that effort, and that he is too tired now to try to do more with PT.  States he may be willing tomorrow depending on schedule, will attempt as appropriate.  Kreg Shropshire, DPT 05/12/2019, 4:36 PM

## 2019-05-12 NOTE — Progress Notes (Signed)
Patient verbally agressive and giving me orders. Refuses to sign AMA form. Paged Dr. Vianne Bulls per patient request to see her before he leaves. Dr. Vianne Bulls states she will only speak to him over the phone. Will continue to monitor. Wenda Low [facility]

## 2019-05-12 NOTE — Progress Notes (Signed)
[facility]  ROUNDING NOTE   Subjective:  Patient concerned about his diet today. States that he does not want to be on a renal diet. In addition states that he eats generally what he wants at home without any issues. Requesting to sign out AMA.   Objective:  Vital signs in last 24 hours:  Temp:  [97.8 F (36.6 C)-98.9 F (37.2 C)] 98.9 F (37.2 C) (09/03 0735) Pulse Rate:  [72-82] 76 (09/03 0735) Resp:  [14-20] 18 (09/03 0735) BP: (114-154)/(55-76) 144/62 (09/03 0735) SpO2:  [91 %-100 %] 97 % (09/03 0735) Weight:  [65.6 kg] 65.6 kg (09/02 1315)  Weight change: 1.501 kg Filed Weights   05/11/19 0627 05/11/19 1015 05/11/19 1315  Weight: 65.5 kg 67 kg 65.6 kg    Intake/Output: I/O last 3 completed shifts: In: 0  Out: 2129 [Other:2129]   Intake/Output this shift:  No intake/output data recorded.  Physical Exam: General: NAD  Head: Normocephalic, atraumatic. Moist oral mucosal membranes  Eyes: Anicteric   Neck: Supple   Lungs:  CTAB, normal effort  Heart: S1S2 no rubs  Abdomen:  Soft, nontender, BS present  Extremities: no peripheral edema.  Neurologic: Nonfocal, moving all four extremities  Skin: No lesions  Access: LUE AVF    Basic Metabolic Panel: Recent Labs  Lab 05/06/19 0645 05/10/19 0510 05/10/19 1521 05/11/19 1535  NA 136  --  133*  --   K 5.1  --  6.1* 4.6  CL 96*  --  91*  --   CO2 25  --  28  --   GLUCOSE 167*  --  187*  --   BUN 65*  --  68*  --   CREATININE 9.16* 8.61* 9.55*  --   CALCIUM 8.7*  --  8.4*  --     Liver Function Tests: Recent Labs  Lab 05/10/19 1521  AST 18  ALT 32  ALKPHOS 193*  BILITOT 0.3  PROT 6.6  ALBUMIN 2.9*   No results for input(s): LIPASE, AMYLASE in the last 168 hours. No results for input(s): AMMONIA in the last 168 hours.  CBC: Recent Labs  Lab 05/10/19 0510  WBC 4.8  HGB 9.4*  HCT 30.7*  MCV 95.9  PLT 244    Cardiac Enzymes: No results for input(s): CKTOTAL, CKMB, CKMBINDEX,  TROPONINI in the last 168 hours.  BNP: Invalid input(s): POCBNP  CBG: Recent Labs  Lab 05/09/19 1127 05/09/19 1644 05/10/19 0800 05/10/19 1152 05/11/19 1225  GLUCAP 86 172* 165* 174* 160*    Microbiology: Results for orders placed or performed during the hospital encounter of 04/29/19  SARS Coronavirus 2 [facility] order, Performed in [facility] hospital lab) Nasopharyngeal Nasopharyngeal Swab     Status: None   Collection Time: 04/29/19  6:03 PM   Specimen: Nasopharyngeal Swab  Result Value Ref Range Status   SARS Coronavirus 2 NEGATIVE NEGATIVE Final    Comment: (NOTE) If result is NEGATIVE SARS-CoV-2 target nucleic acids are NOT DETECTED. The SARS-CoV-2 RNA is generally detectable in upper and lower  respiratory specimens during the acute phase of infection. The lowest  concentration of SARS-CoV-2 viral copies this assay can detect is 250  copies / mL. A negative result does not preclude SARS-CoV-2 infection  and should not be used as the sole basis for treatment or other  patient management decisions.  A negative result may occur with  improper specimen collection / handling, submission of specimen other  than nasopharyngeal swab, presence of viral  mutation(s) within the  areas targeted by this assay, and inadequate number of viral copies  (<250 copies / mL). A negative result must be combined with clinical  observations, patient history, and epidemiological information. If result is POSITIVE SARS-CoV-2 target nucleic acids are DETECTED. The SARS-CoV-2 RNA is generally detectable in upper and lower  respiratory specimens dur ing the acute phase of infection.  Positive  results are indicative of active infection with SARS-CoV-2.  Clinical  correlation with patient history and other diagnostic information is  necessary to determine patient infection status.  Positive results do  not rule out bacterial infection or co-infection with other viruses. If result is  PRESUMPTIVE POSTIVE SARS-CoV-2 nucleic acids MAY BE PRESENT.   A presumptive positive result was obtained on the submitted specimen  and confirmed on repeat testing.  While 2019 novel coronavirus  (SARS-CoV-2) nucleic acids may be present in the submitted sample  additional confirmatory testing may be necessary for epidemiological  and / or clinical management purposes  to differentiate between  SARS-CoV-2 and other Sarbecovirus currently known to infect humans.  If clinically indicated additional testing with an alternate test  methodology [phone]) is advised. The SARS-CoV-2 RNA is generally  detectable in upper and lower respiratory sp ecimens during the acute  phase of infection. The expected result is Negative. Fact Sheet for Patients:  StrictlyIdeas.no Fact Sheet for Healthcare Providers: BankingDealers.co.za This test is not yet approved or cleared by the [place] FDA and has been authorized for detection and/or diagnosis of SARS-CoV-2 by FDA under an Emergency Use Authorization (EUA).  This EUA will remain in effect (meaning this test can be used) for the duration of the COVID-19 declaration under Section 564(b)(1) of the Act, 21 U.S.C. section 360bbb-3(b)(1), unless the authorization is terminated or revoked sooner. Performed at [facility], [address] [place], [place] [address]   Bordetella pertussis PCR     Status: None   Collection Time: 04/29/19 11:27 PM   Specimen: Nasopharyngeal Swab  Result Value Ref Range Status   B pertussis, DNA Negative Negative Final   B parapertussis, DNA Negative Negative Final    Comment: (NOTE) This test was developed and its performance characteristics determined by [employer]. It has not been cleared or approved by the U.S. Food and Drug Administration. The FDA has determined that such clearance or approval is not necessary. This test is used for clinical  purposes. It should not be regarded as investigational or research. Performed At: [facility] [place], [place] [id number] Rush Farmer MD [id number]     Coagulation Studies: No results for input(s): LABPROT, INR in the last 72 hours.  Urinalysis: No results for input(s): COLORURINE, LABSPEC, PHURINE, GLUCOSEU, HGBUR, BILIRUBINUR, KETONESUR, PROTEINUR, UROBILINOGEN, NITRITE, LEUKOCYTESUR in the last 72 hours.  Invalid input(s): APPERANCEUR    Imaging: Mr Cervical Spine Wo Contrast  Result Date: 05/11/2019 CLINICAL DATA:  Initial evaluation for left-sided neck pain, recent fall 10 days ago. EXAM: MRI CERVICAL SPINE WITHOUT CONTRAST TECHNIQUE: Multiplanar, multisequence MR imaging of the cervical spine was performed. No intravenous contrast was administered. COMPARISON:  Prior CT from 04/29/2019. FINDINGS: Alignment: Straightening with mild reversal of the normal cervical lordosis, apex at C4-5. No listhesis. Vertebrae: Vertebral body height maintained without evidence for acute or chronic fracture. Prominent endplate Schmorl's node with associated mild reactive marrow edema noted at the superior endplate of C7, also seen on prior CT. Reactive endplate changes present about the C4-5 and C5-6 interspaces as well.  Mild marrow edema about the right C3-4 and left C4-5 facets due to facet arthritis. Underlying bone marrow signal intensity diffusely decreased on T1 weighted imaging, nonspecific, but suspected to be related to patient's history of anemia or smoking. No discrete or worrisome osseous lesions. Cord: Signal intensity within the cervical spinal cord is within normal limits. Posterior Fossa, vertebral arteries, paraspinal tissues: Visualized brain and posterior fossa normal. Craniocervical junction within normal limits. Mild diffuse prevertebral edema extending from the skull base through approximately C7-T1 (series 7, image 7). Finding is nonspecific, but could  reflect sequelae of low-grade ligamentous injury/strain given history of recent fall. No other findings to suggest acute infection within the cervical spine which would be the main differential consideration. Normal intravascular flow voids seen within the left vertebral artery. Right vertebral artery markedly hypoplastic and not well seen. Disc levels: C2-C3: Negative interspace. Mild left-sided facet hypertrophy. No canal or foraminal stenosis. C3-C4: Diffuse degenerative disc osteophyte with intervertebral disc space narrowing. Bilateral facet degeneration. Flattening of the ventral thecal sac with resultant mild spinal stenosis. Severe right with moderate left C4 foraminal stenosis. C4-C5: Diffuse degenerative disc osteophyte with intervertebral disc space narrowing. Broad posterior component effaces the ventral thecal sac resultant moderate spinal stenosis. Mild cord flattening without cord signal changes. Severe bilateral C5 foraminal narrowing, worse on the right. C5-C6: Diffuse disc osteophyte with intervertebral disc space narrowing. Broad posterior component flattens and effaces the ventral thecal sac, with a slightly more focal left paracentral component (series 8, image 17). Resultant mild spinal stenosis without significant cord deformity. Severe right with moderate left C6 foraminal stenosis. C6-C7: Mild disc bulge with uncovertebral hypertrophy. Disc bulging asymmetric to the left. Mild bilateral facet hypertrophy. Resultant mild spinal stenosis with moderate left greater than right C7 foraminal narrowing. C7-T1: Negative interspace. Right greater than left facet hypertrophy. No stenosis. Visualized upper thoracic spine demonstrates no significant finding. IMPRESSION: 1. Mild diffuse prevertebral edema extending from the skull base through C7-T1. Finding is nonspecific, but could reflect the sequelae of low-grade ligamentous injury/strain given the history of recent fall. No other evidence for acute  infection within the cervical spine which would be the main differential consideration. No other acute traumatic injury identified. 2. Moderate multilevel cervical spondylolysis with resultant mild to moderate diffuse spinal stenosis at C3-4 through C6-7, most pronounced at C4-5. 3. Multifactorial degenerative changes with resultant moderate to severe bilateral foraminal narrowing at C4 through C7 as above. 4. Prominent facet degeneration with associated mild reactive marrow edema about the left C3-4 and right C4-5 facets, which could contribute to underlying neck pain. 5. Diffusely decreased T1 signal within the visualized bone marrow, nonspecific, but suspected to related to patient's known history of anemia or smoking. Electronically Signed   By: Jeannine Boga M.D.   On: 05/11/2019 23:36     Medications:    . amiodarone  200 mg Oral BID  . benzonatate  200 mg Oral TID  . calcium acetate  667 mg Oral TID WC  . Chlorhexidine Gluconate Cloth  6 each Topical Daily  . cinacalcet  30 mg Oral 3 times weekly  . epoetin (EPOGEN/PROCRIT) injection  4,000 Units Intravenous Q M,W,F-HD  . feeding supplement (NEPRO CARB STEADY)  237 mL Oral BID BM  . fluticasone  2 spray Each Nare Daily  . heparin  5,000 Units Subcutaneous Q8H  . insulin aspart  0-9 Units Subcutaneous TID WC  . levofloxacin  500 mg Oral Q48H  . living well with diabetes book  Does not apply Once  . metoCLOPramide (REGLAN) injection  5 mg Intravenous Q8H  . multivitamin  1 tablet Oral QHS  . pantoprazole (PROTONIX) IV  40 mg Intravenous Q12H  . polyvinyl alcohol  1 drop Right Eye TID  . sodium zirconium cyclosilicate  10 g Oral Daily  . tobramycin  1 drop Both Eyes Q6H  . umeclidinium-vilanterol  1 puff Inhalation Daily   acetaminophen **OR** acetaminophen, albuterol, cyclobenzaprine, diphenhydrAMINE, gabapentin, guaiFENesin, guaiFENesin-codeine, ipratropium-albuterol, morphine injection, ondansetron **OR** ondansetron  (ZOFRAN) IV, oxyCODONE, polyethylene glycol  Assessment/ Plan:  [name] is a 58 y.o. black male with end stage renal disease on hemodialysis, hypertension, congestive heart failure, diabetes mellitus type II, history of substance abuse, COPD with ongoing tobacco use  [facility] MWF 65kg 180 minutes 2K bath  1. End stage renal disease with hyperkalemia: Patient underwent hemodialysis yesterday.  Tolerated well.  No acute indication for dialysis today.  Potassium down to 4.6.  2. Syncope: diagnosed as post-tussive syncope at [facility].   Hypotensive on this admission.  Atrial fibrillation with rapid ventricular response treated with amiodarone.   -Patient has been evaluated by cardiology for this issue.  3. Anemia of chronic kidney disease:  -  Lab Results  Component Value Date   HGB 9.4 (L) 05/10/2019   -Continue Epogen as an outpatient.  4. Secondary Hyperparathyroidism:  Lab Results  Component Value Date   PTH 708 (H) 11/24/2018   CALCIUM 8.4 (L) 05/10/2019   PHOS 6.3 (H) 11/24/2018   -Recommend continued monitoring of bone mineral metabolism parameters as an outpatient.   LOS: 12 Jahon Bart 9/3/202010:59 AM

## 2019-05-12 NOTE — Progress Notes (Signed)
Pt is cursing and asking to eat meat and cheese ,and does not want renal diet.he says he eats like this at home and says he is healthy at home and he feels weak here,he had nausea and vomiting and said he is having this for last one week and did not want to eat until today ,today he says he wants to eat and he is not getting meat and cheese.I told him we can not order what he wants in hospital.I offered one time low sodium food  For his breakfast today. He needs to be on previous renal diet from lunch onwards today,

## 2019-05-12 NOTE — Consult Note (Signed)
States feeling better. Pain improved.   Past Medical History:  Diagnosis Date  . Bronchitis   . Hypertension   . Renal disorder    kidney failure, dialysis    Past Surgical History:  Procedure Laterality Date  . DIALYSIS FISTULA CREATION      Family History  Problem Relation Age of Onset  . Hypertension Mother     Social History:  reports that he has been smoking cigarettes. He has been smoking about 1.00 pack per day. He has never used smokeless tobacco. He reports current drug use. Drug: Marijuana. He reports that he does not drink alcohol.  No Known Allergies   Physical Examination: Blood pressure (!) 144/62, pulse 76, temperature 98.9 F (37.2 C), temperature source Oral, resp. rate 18, height 5\' 6"  (1.676 m), weight 65.6 kg, SpO2 97 %.   Neurological Examination   Mental Status: Alert, oriented. Cranial Nerves: II: Discs flat bilaterally; Visual fields grossly normal, pupils equal, round, reactive to light and accommodation III,IV, VI: ptosis not present, extra-ocular motions intact bilaterally V,VII: smile symmetric, facial light touch sensation normal bilaterally VIII: hearing normal bilaterally IX,X: gag reflex present XI: bilateral shoulder shrug XII: midline tongue extension Motor: Right : Upper extremity   5/5    Left:     Upper extremity   5/5  Lower extremity   5/5     Lower extremity   5/5 Tone and bulk:normal tone throughout; no atrophy noted Sensory: Pinprick and light touch intact throughout, bilaterally Deep Tendon Reflexes: 1+ and symmetric throughout Plantars: Right: downgoing   Left: downgoing Cerebellar: Not tested Gait: not tested      Laboratory Studies:   Basic Metabolic Panel: Recent Labs  Lab 05/06/19 0645 05/10/19 0510 05/10/19 1521 05/11/19 1535  NA 136  --  133*  --   K 5.1  --  6.1* 4.6  CL 96*  --  91*  --   CO2 25  --  28  --   GLUCOSE 167*  --  187*  --   BUN 65*  --  68*  --   CREATININE 9.16* 8.61* 9.55*  --    CALCIUM 8.7*  --  8.4*  --     Liver Function Tests: Recent Labs  Lab 05/10/19 1521  AST 18  ALT 32  ALKPHOS 193*  BILITOT 0.3  PROT 6.6  ALBUMIN 2.9*   No results for input(s): LIPASE, AMYLASE in the last 168 hours. No results for input(s): AMMONIA in the last 168 hours.  CBC: Recent Labs  Lab 05/10/19 0510  WBC 4.8  HGB 9.4*  HCT 30.7*  MCV 95.9  PLT 244    Cardiac Enzymes: No results for input(s): CKTOTAL, CKMB, CKMBINDEX, TROPONINI in the last 168 hours.  BNP: Invalid input(s): POCBNP  CBG: Recent Labs  Lab 05/09/19 1127 05/09/19 1644 05/10/19 0800 05/10/19 1152 05/11/19 1225  GLUCAP 86 172* 165* 174* 160*    Microbiology: Results for orders placed or performed during the hospital encounter of 04/29/19  SARS Coronavirus 2 [facility] order, Performed in [facility] hospital lab) Nasopharyngeal Nasopharyngeal Swab     Status: None   Collection Time: 04/29/19  6:03 PM   Specimen: Nasopharyngeal Swab  Result Value Ref Range Status   SARS Coronavirus 2 NEGATIVE NEGATIVE Final    Comment: (NOTE) If result is NEGATIVE SARS-CoV-2 target nucleic acids are NOT DETECTED. The SARS-CoV-2 RNA is generally detectable in upper and lower  respiratory specimens during the acute phase of infection. The  lowest  concentration of SARS-CoV-2 viral copies this assay can detect is 250  copies / mL. A negative result does not preclude SARS-CoV-2 infection  and should not be used as the sole basis for treatment or other  patient management decisions.  A negative result may occur with  improper specimen collection / handling, submission of specimen other  than nasopharyngeal swab, presence of viral mutation(s) within the  areas targeted by this assay, and inadequate number of viral copies  (<250 copies / mL). A negative result must be combined with clinical  observations, patient history, and epidemiological information. If result is POSITIVE SARS-CoV-2 target nucleic  acids are DETECTED. The SARS-CoV-2 RNA is generally detectable in upper and lower  respiratory specimens dur ing the acute phase of infection.  Positive  results are indicative of active infection with SARS-CoV-2.  Clinical  correlation with patient history and other diagnostic information is  necessary to determine patient infection status.  Positive results do  not rule out bacterial infection or co-infection with other viruses. If result is PRESUMPTIVE POSTIVE SARS-CoV-2 nucleic acids MAY BE PRESENT.   A presumptive positive result was obtained on the submitted specimen  and confirmed on repeat testing.  While 2019 novel coronavirus  (SARS-CoV-2) nucleic acids may be present in the submitted sample  additional confirmatory testing may be necessary for epidemiological  and / or clinical management purposes  to differentiate between  SARS-CoV-2 and other Sarbecovirus currently known to infect humans.  If clinically indicated additional testing with an alternate test  methodology [phone]) is advised. The SARS-CoV-2 RNA is generally  detectable in upper and lower respiratory sp ecimens during the acute  phase of infection. The expected result is Negative. Fact Sheet for Patients:  StrictlyIdeas.no Fact Sheet for Healthcare Providers: BankingDealers.co.za This test is not yet approved or cleared by the [place] FDA and has been authorized for detection and/or diagnosis of SARS-CoV-2 by FDA under an Emergency Use Authorization (EUA).  This EUA will remain in effect (meaning this test can be used) for the duration of the COVID-19 declaration under Section 564(b)(1) of the Act, 21 U.S.C. section 360bbb-3(b)(1), unless the authorization is terminated or revoked sooner. Performed at [facility], [address] [place], [place] [address]   Bordetella pertussis PCR     Status: None   Collection Time: 04/29/19 11:27 PM    Specimen: Nasopharyngeal Swab  Result Value Ref Range Status   B pertussis, DNA Negative Negative Final   B parapertussis, DNA Negative Negative Final    Comment: (NOTE) This test was developed and its performance characteristics determined by [employer]. It has not been cleared or approved by the U.S. Food and Drug Administration. The FDA has determined that such clearance or approval is not necessary. This test is used for clinical purposes. It should not be regarded as investigational or research. Performed At: [facility] [place], [place] [id number] Rush Farmer MD [id number]     Coagulation Studies: No results for input(s): LABPROT, INR in the last 72 hours.  Urinalysis: No results for input(s): COLORURINE, LABSPEC, PHURINE, GLUCOSEU, HGBUR, BILIRUBINUR, KETONESUR, PROTEINUR, UROBILINOGEN, NITRITE, LEUKOCYTESUR in the last 168 hours.  Invalid input(s): APPERANCEUR  Lipid Panel:  No results found for: CHOL, TRIG, HDL, CHOLHDL, VLDL, LDLCALC  HgbA1C:  Lab Results  Component Value Date   HGBA1C 7.6 (H) 05/02/2019    Urine Drug Screen:  No results found for: LABOPIA, COCAINSCRNUR, LABBENZ, AMPHETMU, THCU, LABBARB  Alcohol Level: No  results for input(s): ETH in the last 168 hours.  Other results: EKG: normal EKG, normal sinus rhythm, unchanged from previous tracings.  Imaging: Mr Cervical Spine Wo Contrast  Result Date: 05/11/2019 CLINICAL DATA:  Initial evaluation for left-sided neck pain, recent fall 10 days ago. EXAM: MRI CERVICAL SPINE WITHOUT CONTRAST TECHNIQUE: Multiplanar, multisequence MR imaging of the cervical spine was performed. No intravenous contrast was administered. COMPARISON:  Prior CT from 04/29/2019. FINDINGS: Alignment: Straightening with mild reversal of the normal cervical lordosis, apex at C4-5. No listhesis. Vertebrae: Vertebral body height maintained without evidence for acute or chronic fracture. Prominent  endplate Schmorl's node with associated mild reactive marrow edema noted at the superior endplate of C7, also seen on prior CT. Reactive endplate changes present about the C4-5 and C5-6 interspaces as well. Mild marrow edema about the right C3-4 and left C4-5 facets due to facet arthritis. Underlying bone marrow signal intensity diffusely decreased on T1 weighted imaging, nonspecific, but suspected to be related to patient's history of anemia or smoking. No discrete or worrisome osseous lesions. Cord: Signal intensity within the cervical spinal cord is within normal limits. Posterior Fossa, vertebral arteries, paraspinal tissues: Visualized brain and posterior fossa normal. Craniocervical junction within normal limits. Mild diffuse prevertebral edema extending from the skull base through approximately C7-T1 (series 7, image 7). Finding is nonspecific, but could reflect sequelae of low-grade ligamentous injury/strain given history of recent fall. No other findings to suggest acute infection within the cervical spine which would be the main differential consideration. Normal intravascular flow voids seen within the left vertebral artery. Right vertebral artery markedly hypoplastic and not well seen. Disc levels: C2-C3: Negative interspace. Mild left-sided facet hypertrophy. No canal or foraminal stenosis. C3-C4: Diffuse degenerative disc osteophyte with intervertebral disc space narrowing. Bilateral facet degeneration. Flattening of the ventral thecal sac with resultant mild spinal stenosis. Severe right with moderate left C4 foraminal stenosis. C4-C5: Diffuse degenerative disc osteophyte with intervertebral disc space narrowing. Broad posterior component effaces the ventral thecal sac resultant moderate spinal stenosis. Mild cord flattening without cord signal changes. Severe bilateral C5 foraminal narrowing, worse on the right. C5-C6: Diffuse disc osteophyte with intervertebral disc space narrowing. Broad posterior  component flattens and effaces the ventral thecal sac, with a slightly more focal left paracentral component (series 8, image 17). Resultant mild spinal stenosis without significant cord deformity. Severe right with moderate left C6 foraminal stenosis. C6-C7: Mild disc bulge with uncovertebral hypertrophy. Disc bulging asymmetric to the left. Mild bilateral facet hypertrophy. Resultant mild spinal stenosis with moderate left greater than right C7 foraminal narrowing. C7-T1: Negative interspace. Right greater than left facet hypertrophy. No stenosis. Visualized upper thoracic spine demonstrates no significant finding. IMPRESSION: 1. Mild diffuse prevertebral edema extending from the skull base through C7-T1. Finding is nonspecific, but could reflect the sequelae of low-grade ligamentous injury/strain given the history of recent fall. No other evidence for acute infection within the cervical spine which would be the main differential consideration. No other acute traumatic injury identified. 2. Moderate multilevel cervical spondylolysis with resultant mild to moderate diffuse spinal stenosis at C3-4 through C6-7, most pronounced at C4-5. 3. Multifactorial degenerative changes with resultant moderate to severe bilateral foraminal narrowing at C4 through C7 as above. 4. Prominent facet degeneration with associated mild reactive marrow edema about the left C3-4 and right C4-5 facets, which could contribute to underlying neck pain. 5. Diffusely decreased T1 signal within the visualized bone marrow, nonspecific, but suspected to related to patient's known history of  anemia or smoking. Electronically Signed   By: Jeannine Boga M.D.   On: 05/11/2019 23:36     Assessment/Plan: 58 y.o. male  history of ESRD on HD MWF and hypertension who presented to the ED with cough and syncope over the last 5 months. Presented on 8/27.  Pt has been having persistent n/v along with history of fall.  S/p MRI patient has chronic  ischemia, small lacunar infarcts and encephalomalacia but no acute abnormalities.   - Very possible post concussive symptoms but really not specific.  Concussion is usually associated with visual disturbances and headaches which he doesn't have  - No clear focality on neurological examination  - Even if post concussion not true treatment outside of symptomatic treatment which patient is on.  - Other treatment includes rest, hydration, reducing screen time.  - Post concussion symptoms usually start improving after 10 days.   - Pt is complaining of L sided neck pain sharp and not position related.  - MRI brain- C7-T1 edema. Possible signs of ligamentous injury. D/w primary team, please have image evaluated by Neurosurgery to make sure no need for surgical intervention.

## 2019-05-12 NOTE — TOC Progression Note (Signed)
Transition of Care [facility]) - Progression Note    Patient Details  Name: Jeremy [name] MRN: [id number] Date of Birth: 19-Apr-1961  Transition of Care [facility]) CM/SW Contact  Ross Ludwig, [place] Phone Number: 05/12/2019, 5:14 PM  Clinical Narrative:     Patient has not been willing to participate in PT, patient is agreeable to having home health.  CSW made referral to [facility] and spoke to [place].  Patient is agreeable to having home health PT, RN, and Education officer, museum.  CSW continuing to follow patient's progress throughout discharge planning.   Expected Discharge Plan: [place] Barriers to Discharge: Continued Medical Work up  Expected Discharge Plan and Services Expected Discharge Plan: [place] In-house Referral: Clinical Social Work   Post Acute Care Choice: [place] Living arrangements for the past 2 months: Apartment Expected Discharge Date: 05/01/19               DME Arranged: N/A DME Agency: NA     Representative spoke with at DME Agency: na             Social Determinants of Health (SDOH) Interventions    Readmission Risk Interventions Readmission Risk Prevention Plan 05/03/2019 05/01/2019 04/04/2019  Transportation Screening Complete Complete -  Medication Review Press photographer) Complete Complete -  PCP or Specialist appointment within 3-5 days of discharge Complete - Complete  HRI or Home Care Consult Complete - -  SW Recovery Care/Counseling Consult Complete - Complete  Palliative Care Screening - Not Applicable -  Skilled Nursing Facility Complete - -  Some recent data might be hidden

## 2019-05-12 NOTE — Progress Notes (Signed)
Patient refuses to wear his ID band, after multiple education attempts. Jeremy [name] [facility]

## 2019-05-12 NOTE — Plan of Care (Signed)
  Problem: Education: Goal: Knowledge of General Education information will improve Description: Including pain rating scale, medication(s)/side effects and non-pharmacologic comfort measures Outcome: Not Progressing Note: Patient states "I like to see progress everyday, and I don't see none today." Informed physician. Will continue to monitor. Wenda Low [facility]

## 2019-05-12 NOTE — Progress Notes (Signed)
Patient states we're all "stuck on stupid", and wants "some tall guy" to explain "what's up". I think the patient is referring to his neurologist, which I will text page and request he call the patient on his room phone. Wenda Low [facility]

## 2019-05-12 NOTE — Progress Notes (Signed)
Patient states "I don't give a shit if I throw up all day. Somebody gotta clean it up. Wouldn't want to do it." Also, patient states, "I thought I took all my medications" immediately before I gave him a dose of PRN Mucinex he just requested. Wenda Low [facility]

## 2019-05-13 LAB — CBC
HCT: 32 % — ABNORMAL LOW (ref 39.0–52.0)
Hemoglobin: 9.8 g/dL — ABNORMAL LOW (ref 13.0–17.0)
MCH: 28.8 pg (ref 26.0–34.0)
MCHC: 30.6 g/dL (ref 30.0–36.0)
MCV: 94.1 fL (ref 80.0–100.0)
Platelets: 271 10*3/uL (ref 150–400)
RBC: 3.4 MIL/uL — ABNORMAL LOW (ref 4.22–5.81)
RDW: 15.1 % (ref 11.5–15.5)
WBC: 6.2 10*3/uL (ref 4.0–10.5)
nRBC: 0 % (ref 0.0–0.2)

## 2019-05-13 LAB — CREATININE, SERUM
Creatinine, Ser: 10.06 mg/dL — ABNORMAL HIGH (ref 0.61–1.24)
GFR calc Af Amer: 6 mL/min — ABNORMAL LOW (ref >60)
GFR calc non Af Amer: 5 mL/min — ABNORMAL LOW (ref >60)

## 2019-05-13 LAB — PHOSPHORUS: Phosphorus: 4.6 mg/dL (ref 2.5–4.6)

## 2019-05-13 LAB — PROCALCITONIN: Procalcitonin: 0.55 ng/mL

## 2019-05-13 MED ORDER — METOCLOPRAMIDE HCL 5 MG PO TABS: 5.0000 mg | ORAL_TABLET | Freq: Three times a day (TID) | ORAL | 1 refills | Status: DC

## 2019-05-13 MED ORDER — LEVOFLOXACIN 500 MG PO TABS: 500.0000 mg | ORAL_TABLET | ORAL | 0 refills | Status: AC

## 2019-05-13 MED ORDER — OXYCODONE HCL 5 MG PO TABS: 5.0000 mg | ORAL_TABLET | Freq: Four times a day (QID) | ORAL | 0 refills | Status: DC | PRN

## 2019-05-13 MED ORDER — NEPRO/CARBSTEADY PO LIQD: 237.0000 mL | Freq: Two times a day (BID) | ORAL | 0 refills | Status: AC

## 2019-05-13 MED ORDER — AMIODARONE HCL 200 MG PO TABS: 200.0000 mg | ORAL_TABLET | Freq: Two times a day (BID) | ORAL | 0 refills | Status: DC

## 2019-05-13 MED ORDER — SODIUM ZIRCONIUM CYCLOSILICATE 10 G PO PACK: 10.0000 g | PACK | Freq: Every day | ORAL | 0 refills | Status: DC

## 2019-05-13 NOTE — Progress Notes (Signed)
Possible discharge later today after hemodialysis, patient is waiting for neurosurgery note.  I epic text message neurosurgery, Dr. Cari Caraway said that flex ext xrays are normal - that signal on MRI is inconsequential. no workup or followup needed, official note is pending. r

## 2019-05-13 NOTE — Progress Notes (Signed)
Post HD Assessment:  Pt refused ECG monitoring for this Tx d/t adheisive allergy per Pt statement. Pt states feeling mildly dizzie at completion of Tx but states that he is fine and is expecting to discharge at completion of this Tx.     05/13/19 1415  Neurological  Level of Consciousness Alert  Orientation Level Oriented X4  Respiratory  Respiratory Pattern Regular;Unlabored  Chest Assessment Chest expansion symmetrical  Bilateral Breath Sounds Diminished  Cough None  Cardiac  Pulse Regular  Heart Sounds S1, S2  Jugular Venous Distention (JVD) No (See note)  Antiarrhythmic device No  Vascular  R Radial Pulse +2  L Radial Pulse +2  Edema Generalized  Psychosocial  Psychosocial (WDL) WDL  Patient Behaviors Calm;Cooperative

## 2019-05-13 NOTE — TOC Transition Note (Signed)
Transition of Care [facility]) - CM/SW Discharge Note   Patient Details  Name: Jeremy [name] MRN: [id number] Date of Birth: 01-Jun-1961  [facility]) CM/SW Contact:  Ross Ludwig, LCSW Phone Number: 05/13/2019, 4:56 PM   Clinical Narrative:     CSW spoke with patient and he would like home health, CSW provided choice and he chose [place].  Patient has not been participating with PT at hospital, he would like to go home with home health.  CSW contacted [facility] and they can provide home health services for him.  Patient did not feel like he had any other needs.   Final next level of care: [place] Barriers to Discharge: Barriers Resolved   Patient Goals and CMS Choice Patient states their goals for this hospitalization and ongoing recovery are:: To return back home with home health CMS Medicare.gov Compare Post Acute Care list provided to:: Patient Choice offered to / list presented to : Patient  Discharge Placement    Patient discharging back home with home health services.                   Discharge Plan and Services In-house Referral: Clinical Social Work   Post Acute Care Choice: [place]          DME Arranged: N/A DME Agency: NA     Representative spoke with at DME Agency: na HH Arranged: PT, RN, OT, Social Work, Nurse's Aide [place] Agency: [place] ([place]) Date [place]: 05/12/19 Time [place]: [id number] Representative spoke with at [place]: [place] ([place]) Interventions     Readmission Risk Interventions Readmission Risk Prevention Plan 05/03/2019 05/01/2019 04/04/2019  Transportation Screening Complete Complete -  Medication Review Press photographer) Complete Complete -  PCP or Specialist appointment within 3-5 days of discharge Complete - Complete  HRI or Home Care Consult Complete - -  SW Recovery Care/Counseling Consult Complete - Complete   Palliative Care Screening - Not Applicable -  Skilled Nursing Facility Complete - -  Some recent data might be hidden

## 2019-05-13 NOTE — Consult Note (Signed)
States feeling better. Pain improved. No further N/v while medicated   Past Medical History:  Diagnosis Date  . Bronchitis   . Hypertension   . Renal disorder    kidney failure, dialysis    Past Surgical History:  Procedure Laterality Date  . DIALYSIS FISTULA CREATION      Family History  Problem Relation Age of Onset  . Hypertension Mother     Social History:  reports that he has been smoking cigarettes. He has been smoking about 1.00 pack per day. He has never used smokeless tobacco. He reports current drug use. Drug: Marijuana. He reports that he does not drink alcohol.  No Known Allergies   Physical Examination: Blood pressure (!) 161/73, pulse 71, temperature 97.7 F (36.5 C), temperature source Oral, resp. rate 14, height 5\' 6"  (1.676 m), weight 66.3 kg, SpO2 97 %.   Neurological Examination   Mental Status: Alert, oriented. Cranial Nerves: II: Discs flat bilaterally; Visual fields grossly normal, pupils equal, round, reactive to light and accommodation III,IV, VI: ptosis not present, extra-ocular motions intact bilaterally V,VII: smile symmetric, facial light touch sensation normal bilaterally VIII: hearing normal bilaterally IX,X: gag reflex present XI: bilateral shoulder shrug XII: midline tongue extension Motor: Right : Upper extremity   5/5    Left:     Upper extremity   5/5  Lower extremity   5/5     Lower extremity   5/5 Tone and bulk:normal tone throughout; no atrophy noted Sensory: Pinprick and light touch intact throughout, bilaterally Deep Tendon Reflexes: 1+ and symmetric throughout Plantars: Right: downgoing   Left: downgoing Cerebellar: Not tested Gait: not tested      Laboratory Studies:   Basic Metabolic Panel: Recent Labs  Lab 05/10/19 0510 05/10/19 1521 05/11/19 1535 05/13/19 0818  NA  --  133*  --   --   K  --  6.1* 4.6  --   CL  --  91*  --   --   CO2  --  28  --   --   GLUCOSE  --  187*  --   --   BUN  --  68*  --   --    CREATININE 8.61* 9.55*  --  10.06*  CALCIUM  --  8.4*  --   --     Liver Function Tests: Recent Labs  Lab 05/10/19 1521  AST 18  ALT 32  ALKPHOS 193*  BILITOT 0.3  PROT 6.6  ALBUMIN 2.9*   No results for input(s): LIPASE, AMYLASE in the last 168 hours. No results for input(s): AMMONIA in the last 168 hours.  CBC: Recent Labs  Lab 05/10/19 0510 05/13/19 0818  WBC 4.8 6.2  HGB 9.4* 9.8*  HCT 30.7* 32.0*  MCV 95.9 94.1  PLT 244 271    Cardiac Enzymes: No results for input(s): CKTOTAL, CKMB, CKMBINDEX, TROPONINI in the last 168 hours.  BNP: Invalid input(s): POCBNP  CBG: Recent Labs  Lab 05/09/19 1127 05/09/19 1644 05/10/19 0800 05/10/19 1152 05/11/19 1225  GLUCAP 86 172* 165* 174* 160*    Microbiology: Results for orders placed or performed during the hospital encounter of 04/29/19  SARS Coronavirus 2 [facility] order, Performed in [facility] hospital lab) Nasopharyngeal Nasopharyngeal Swab     Status: None   Collection Time: 04/29/19  6:03 PM   Specimen: Nasopharyngeal Swab  Result Value Ref Range Status   SARS Coronavirus 2 NEGATIVE NEGATIVE Final    Comment: (NOTE) If result is NEGATIVE SARS-CoV-2  target nucleic acids are NOT DETECTED. The SARS-CoV-2 RNA is generally detectable in upper and lower  respiratory specimens during the acute phase of infection. The lowest  concentration of SARS-CoV-2 viral copies this assay can detect is 250  copies / mL. A negative result does not preclude SARS-CoV-2 infection  and should not be used as the sole basis for treatment or other  patient management decisions.  A negative result may occur with  improper specimen collection / handling, submission of specimen other  than nasopharyngeal swab, presence of viral mutation(s) within the  areas targeted by this assay, and inadequate number of viral copies  (<250 copies / mL). A negative result must be combined with clinical  observations, patient history, and  epidemiological information. If result is POSITIVE SARS-CoV-2 target nucleic acids are DETECTED. The SARS-CoV-2 RNA is generally detectable in upper and lower  respiratory specimens dur ing the acute phase of infection.  Positive  results are indicative of active infection with SARS-CoV-2.  Clinical  correlation with patient history and other diagnostic information is  necessary to determine patient infection status.  Positive results do  not rule out bacterial infection or co-infection with other viruses. If result is PRESUMPTIVE POSTIVE SARS-CoV-2 nucleic acids MAY BE PRESENT.   A presumptive positive result was obtained on the submitted specimen  and confirmed on repeat testing.  While 2019 novel coronavirus  (SARS-CoV-2) nucleic acids may be present in the submitted sample  additional confirmatory testing may be necessary for epidemiological  and / or clinical management purposes  to differentiate between  SARS-CoV-2 and other Sarbecovirus currently known to infect humans.  If clinically indicated additional testing with an alternate test  methodology [phone]) is advised. The SARS-CoV-2 RNA is generally  detectable in upper and lower respiratory sp ecimens during the acute  phase of infection. The expected result is Negative. Fact Sheet for Patients:  StrictlyIdeas.no Fact Sheet for Healthcare Providers: BankingDealers.co.za This test is not yet approved or cleared by the [place] FDA and has been authorized for detection and/or diagnosis of SARS-CoV-2 by FDA under an Emergency Use Authorization (EUA).  This EUA will remain in effect (meaning this test can be used) for the duration of the COVID-19 declaration under Section 564(b)(1) of the Act, 21 U.S.C. section 360bbb-3(b)(1), unless the authorization is terminated or revoked sooner. Performed at [facility], [address] [place], [place] [address]    Bordetella pertussis PCR     Status: None   Collection Time: 04/29/19 11:27 PM   Specimen: Nasopharyngeal Swab  Result Value Ref Range Status   B pertussis, DNA Negative Negative Final   B parapertussis, DNA Negative Negative Final    Comment: (NOTE) This test was developed and its performance characteristics determined by [employer]. It has not been cleared or approved by the U.S. Food and Drug Administration. The FDA has determined that such clearance or approval is not necessary. This test is used for clinical purposes. It should not be regarded as investigational or research. Performed At: [facility] [place], [place] [id number] Rush Farmer MD [id number]     Coagulation Studies: No results for input(s): LABPROT, INR in the last 72 hours.  Urinalysis: No results for input(s): COLORURINE, LABSPEC, PHURINE, GLUCOSEU, HGBUR, BILIRUBINUR, KETONESUR, PROTEINUR, UROBILINOGEN, NITRITE, LEUKOCYTESUR in the last 168 hours.  Invalid input(s): APPERANCEUR  Lipid Panel:  No results found for: CHOL, TRIG, HDL, CHOLHDL, VLDL, LDLCALC  HgbA1C:  Lab Results  Component Value Date  HGBA1C 7.6 (H) 05/02/2019    Urine Drug Screen:  No results found for: LABOPIA, COCAINSCRNUR, LABBENZ, AMPHETMU, THCU, LABBARB  Alcohol Level: No results for input(s): ETH in the last 168 hours.  Other results: EKG: normal EKG, normal sinus rhythm, unchanged from previous tracings.  Imaging: Dg Cervical Spine With Flex & Extend  Result Date: 05/12/2019 CLINICAL DATA:  Syncopal episode 1 week ago now with neck pain and left shoulder/arm pain. EXAM: CERVICAL SPINE COMPLETE WITH FLEXION AND EXTENSION VIEWS COMPARISON:  April 29, 2019.  MRI dated 05/11/2019. FINDINGS: There is no significant prevertebral soft tissue swelling. There are multilevel degenerative changes throughout the cervical spine, greatest at the C4-C5 and C5-C6 levels. There is no acute displaced  fracture. No dislocation. There is no significant dynamic listhesis. Multilevel osseous neural foraminal narrowing is again noted. This is better visualized on prior CT and MRI. Carotid artery calcifications are noted. Poor dentition is noted. IMPRESSION: 1. No acute osseous abnormality. 2. Multilevel degenerative changes as detailed above, better appreciated on recent MRI of the cervical spine. 3. No evidence for significant dynamic listhesis on the flexion and extension views. Electronically Signed   By: Constance Holster M.D.   On: 05/12/2019 20:21   Mr Cervical Spine Wo Contrast  Result Date: 05/11/2019 CLINICAL DATA:  Initial evaluation for left-sided neck pain, recent fall 10 days ago. EXAM: MRI CERVICAL SPINE WITHOUT CONTRAST TECHNIQUE: Multiplanar, multisequence MR imaging of the cervical spine was performed. No intravenous contrast was administered. COMPARISON:  Prior CT from 04/29/2019. FINDINGS: Alignment: Straightening with mild reversal of the normal cervical lordosis, apex at C4-5. No listhesis. Vertebrae: Vertebral body height maintained without evidence for acute or chronic fracture. Prominent endplate Schmorl's node with associated mild reactive marrow edema noted at the superior endplate of C7, also seen on prior CT. Reactive endplate changes present about the C4-5 and C5-6 interspaces as well. Mild marrow edema about the right C3-4 and left C4-5 facets due to facet arthritis. Underlying bone marrow signal intensity diffusely decreased on T1 weighted imaging, nonspecific, but suspected to be related to patient's history of anemia or smoking. No discrete or worrisome osseous lesions. Cord: Signal intensity within the cervical spinal cord is within normal limits. Posterior Fossa, vertebral arteries, paraspinal tissues: Visualized brain and posterior fossa normal. Craniocervical junction within normal limits. Mild diffuse prevertebral edema extending from the skull base through approximately C7-T1  (series 7, image 7). Finding is nonspecific, but could reflect sequelae of low-grade ligamentous injury/strain given history of recent fall. No other findings to suggest acute infection within the cervical spine which would be the main differential consideration. Normal intravascular flow voids seen within the left vertebral artery. Right vertebral artery markedly hypoplastic and not well seen. Disc levels: C2-C3: Negative interspace. Mild left-sided facet hypertrophy. No canal or foraminal stenosis. C3-C4: Diffuse degenerative disc osteophyte with intervertebral disc space narrowing. Bilateral facet degeneration. Flattening of the ventral thecal sac with resultant mild spinal stenosis. Severe right with moderate left C4 foraminal stenosis. C4-C5: Diffuse degenerative disc osteophyte with intervertebral disc space narrowing. Broad posterior component effaces the ventral thecal sac resultant moderate spinal stenosis. Mild cord flattening without cord signal changes. Severe bilateral C5 foraminal narrowing, worse on the right. C5-C6: Diffuse disc osteophyte with intervertebral disc space narrowing. Broad posterior component flattens and effaces the ventral thecal sac, with a slightly more focal left paracentral component (series 8, image 17). Resultant mild spinal stenosis without significant cord deformity. Severe right with moderate left C6 foraminal stenosis. C6-C7:  Mild disc bulge with uncovertebral hypertrophy. Disc bulging asymmetric to the left. Mild bilateral facet hypertrophy. Resultant mild spinal stenosis with moderate left greater than right C7 foraminal narrowing. C7-T1: Negative interspace. Right greater than left facet hypertrophy. No stenosis. Visualized upper thoracic spine demonstrates no significant finding. IMPRESSION: 1. Mild diffuse prevertebral edema extending from the skull base through C7-T1. Finding is nonspecific, but could reflect the sequelae of low-grade ligamentous injury/strain given  the history of recent fall. No other evidence for acute infection within the cervical spine which would be the main differential consideration. No other acute traumatic injury identified. 2. Moderate multilevel cervical spondylolysis with resultant mild to moderate diffuse spinal stenosis at C3-4 through C6-7, most pronounced at C4-5. 3. Multifactorial degenerative changes with resultant moderate to severe bilateral foraminal narrowing at C4 through C7 as above. 4. Prominent facet degeneration with associated mild reactive marrow edema about the left C3-4 and right C4-5 facets, which could contribute to underlying neck pain. 5. Diffusely decreased T1 signal within the visualized bone marrow, nonspecific, but suspected to related to patient's known history of anemia or smoking. Electronically Signed   By: Jeannine Boga M.D.   On: 05/11/2019 23:36     Assessment/Plan: 58 y.o. male  history of ESRD on HD MWF and hypertension who presented to the ED with cough and syncope over the last 5 months. Presented on 8/27.  Pt has been having persistent n/v along with history of fall.  S/p MRI patient has chronic ischemia, small lacunar infarcts and encephalomalacia but no acute abnormalities.   - Very possible post concussive symptoms but really not specific.  Concussion is usually associated with visual disturbances and headaches which he doesn't have  - No clear focality on neurological examination  - Other treatment includes rest, hydration, reducing screen time.  - Post concussion symptoms usually start improving after 10 days.   - L side neck pain improved.   - MRI brain- C7-T1 edema. Possible signs of ligamentous injury. Flexion/extention normal X rays. Likely no cord injury. Appreciate Neurosurgery assistance - d/c planning from neurological stand point

## 2019-05-13 NOTE — Plan of Care (Signed)
  Problem: Education: Goal: Knowledge of General Education information will improve Description: Including pain rating scale, medication(s)/side effects and non-pharmacologic comfort measures Outcome: Adequate for Discharge   Problem: Health Behavior/Discharge Planning: Goal: Ability to manage health-related needs will improve Outcome: Adequate for Discharge   Problem: Clinical Measurements: Goal: Ability to maintain clinical measurements within normal limits will improve Outcome: Adequate for Discharge Goal: Will remain free from infection Outcome: Adequate for Discharge Goal: Diagnostic test results will improve Outcome: Adequate for Discharge Goal: Respiratory complications will improve Outcome: Adequate for Discharge Goal: Cardiovascular complication will be avoided Outcome: Adequate for Discharge   Problem: Coping: Goal: Level of anxiety will decrease Outcome: Adequate for Discharge   Problem: Pain Managment: Goal: General experience of comfort will improve Outcome: Adequate for Discharge   Problem: Safety: Goal: Ability to remain free from injury will improve Outcome: Adequate for Discharge   Problem: Skin Integrity: Goal: Risk for impaired skin integrity will decrease Outcome: Adequate for Discharge   Problem: Education: Goal: Knowledge of disease and its progression will improve Outcome: Adequate for Discharge Goal: Individualized Educational Video(s) Outcome: Adequate for Discharge   Problem: Fluid Volume: Goal: Compliance with measures to maintain balanced fluid volume will improve Outcome: Adequate for Discharge   Problem: Health Behavior/Discharge Planning: Goal: Ability to manage health-related needs will improve Outcome: Adequate for Discharge   Problem: Nutritional: Goal: Ability to make healthy dietary choices will improve Outcome: Adequate for Discharge   Problem: Clinical Measurements: Goal: Complications related to the disease process,  condition or treatment will be avoided or minimized Outcome: Adequate for Discharge

## 2019-05-13 NOTE — Progress Notes (Signed)
HD Tx started    05/13/19 1049  Vital Signs  Pulse Rate 71  Pulse Rate Source Monitor  BP (!) 158/68  Oxygen Therapy  SpO2 98 %  O2 Device Nasal Cannula  O2 Flow Rate (L/min) 2 L/min  During Hemodialysis Assessment  Blood Flow Rate (mL/min) 400 mL/min  Arterial Pressure (mmHg) -200 mmHg  Venous Pressure (mmHg) 200 mmHg  Transmembrane Pressure (mmHg) 30 mmHg  Ultrafiltration Rate (mL/min) 1250 mL/min  Dialysate Flow Rate (mL/min) 800 ml/min  Conductivity: Machine  13.6  HD Safety Checks Performed Yes  Dialysis Fluid Bolus Normal Saline  Bolus Amount (mL) 250 mL  Intra-Hemodialysis Comments Tx initiated  Fistula / Graft Left Upper arm  Placement Date: 05/17/18   Placed prior to admission: Yes  Orientation: Left  Access Location: Upper arm  Status Accessed  Needle Size 15 g  Drainage Description None

## 2019-05-13 NOTE — Progress Notes (Addendum)
Post HD:  Pt request to increase UF goal to 3, request approved by Holley Raring, MD. Total UF at completion 3500. Pt states refusal of continuous ECG observation for this Tx d/t adhesive allergy/skin irritation.     05/13/19 1415  Vital Signs  Temp 98 F (36.7 C)  Temp Source Oral  Pulse Rate 74  Pulse Rate Source Dinamap  Resp 16  BP (!) 124/59  BP Location Right Arm  BP Method Automatic  Patient Position (if appropriate) Lying  Oxygen Therapy  SpO2 98 %  O2 Device Nasal Cannula  O2 Flow Rate (L/min) 2 L/min  Pain Assessment  Pain Scale 0-10  Pain Score 0  Dialysis Weight  Weight  (unable to weigh Pt)  Type of Weight Post-Dialysis  Post-Hemodialysis Assessment  Rinseback Volume (mL) 250 mL  KECN 73.4 V  Dialyzer Clearance Lightly streaked  Duration of HD Treatment -hour(s) 3 hour(s)  Hemodialysis Intake (mL) 500 mL  UF Total -Machine (mL) 3500 mL  Net UF (mL) 3000 mL  Tolerated HD Treatment Yes  Post-Hemodialysis Comments  (n/a )  AVG/AVF Arterial Site Held (minutes)  (5 minutes)  AVG/AVF Venous Site Held (minutes)  (5 minutes)  Education / Care Plan  Dialysis Education Provided Yes  Documented Education in Care Plan Yes  Outpatient Plan of Care Reviewed and on Chart Yes  Fistula / Graft Left Upper arm  Placement Date: 05/17/18   Placed prior to admission: Yes  Orientation: Left  Access Location: Upper arm  Site Condition No complications  Fistula / Graft Assessment Bruit;Thrill;Present  Status Deaccessed  Drainage Description None

## 2019-05-13 NOTE — Progress Notes (Signed)
Pre HD Assessment    05/13/19 1040  Neurological  Level of Consciousness Alert  Orientation Level Oriented X4  Respiratory  Respiratory Pattern Regular;Unlabored  Chest Assessment Chest expansion symmetrical  Bilateral Breath Sounds Diminished  Cough Dry  Cardiac  Pulse Regular  Heart Sounds S1, S2;Murmur  Jugular Venous Distention (JVD) No  ECG Monitor  (Pt refused, MD aware )  Vascular  R Radial Pulse +2  L Radial Pulse +2  Edema Generalized  Psychosocial  Psychosocial (WDL) WDL  Patient Behaviors Calm;Cooperative

## 2019-05-13 NOTE — Consult Note (Signed)
Referring Physician:  No referring provider defined for this encounter.  Primary Physician:  Patient, No Pcp Per  Chief Complaint: Neck pain  History of Present Illness: Jeremy [name] is a 58 y.o. male who presents with the chief complaint of neck pain.  Patient states that symptoms started couple weeks ago after falling in the hospital.  Also complains of intermittent left upper extremity pain and numbness.  Pain is primarily through superior aspect of left trapezius.  No specific dermatome distribution in left upper extremity complaints.  Symptoms are aggravated with laying on that side and positional changes.  Relieved with positional changes.  Complains of issues with balance that have been gradually worsening over time. Denies numbness in fingertips bilaterally, grip or upper extremity weakness, lower extremity weakness.  Review of Systems:  A 10 point review of systems is negative, except for the pertinent positives and negatives detailed in the HPI.  Past Medical History: Past Medical History:  Diagnosis Date  . Bronchitis   . Hypertension   . Renal disorder    kidney failure, dialysis    Past Surgical History: Past Surgical History:  Procedure Laterality Date  . DIALYSIS FISTULA CREATION      Allergies: Allergies as of 04/29/2019  . (No Known Allergies)    Medications:  Current Facility-Administered Medications:  .  acetaminophen (TYLENOL) tablet 650 mg, 650 mg, Oral, Q6H PRN, 650 mg at 05/10/19 2232 **OR** acetaminophen (TYLENOL) suppository 650 mg, 650 mg, Rectal, Q6H PRN, Mayo, Pete Pelt, MD .  albuterol (PROVENTIL) (2.5 MG/3ML) 0.083% nebulizer solution 2.5 mg, 2.5 mg, Inhalation, Q6H PRN, Mayo, Pete Pelt, MD, 2.5 mg at 05/11/19 0145 .  amiodarone (PACERONE) tablet 200 mg, 200 mg, Oral, BID, Teodoro Spray, MD, 200 mg at 05/12/19 2136 .  benzonatate (TESSALON) capsule 200 mg, 200 mg, Oral, TID, Mayo, Pete Pelt, MD, 200 mg at 05/12/19 2135 .  calcium acetate (PHOSLO)  capsule 667 mg, 667 mg, Oral, TID WC, Mayo, Pete Pelt, MD, 667 mg at 05/13/19 0857 .  Chlorhexidine Gluconate Cloth 2 % PADS 6 each, 6 each, Topical, Daily, Demetrios Loll, MD, 6 each at 05/11/19 0848 .  cinacalcet (SENSIPAR) tablet 30 mg, 30 mg, Oral, 3 times weekly, Mayo, Pete Pelt, MD, 30 mg at 05/13/19 0857 .  cyclobenzaprine (FLEXERIL) tablet 5 mg, 5 mg, Oral, TID PRN, Mayo, Pete Pelt, MD, 5 mg at 05/02/19 0908 .  diphenhydrAMINE (BENADRYL) capsule 25 mg, 25 mg, Oral, Q6H PRN, Seals, Angela H, NP, 25 mg at 05/06/19 2220 .  epoetin alfa (EPOGEN) injection 4,000 Units, 4,000 Units, Intravenous, Q M,W,F-HD, Murlean Iba, MD, 4,000 Units at 05/13/19 1332 .  feeding supplement (NEPRO CARB STEADY) liquid 237 mL, 237 mL, Oral, BID BM, Vaughan Basta, MD, Last Rate: 0 mL/hr at 05/07/19 2200, 237 mL at 05/12/19 1334 .  fluticasone (FLONASE) 50 MCG/ACT nasal spray 2 spray, 2 spray, Each Nare, Daily, Mayo, Pete Pelt, MD, 2 spray at 05/12/19 [phone] .  gabapentin (NEURONTIN) capsule 100 mg, 100 mg, Oral, Daily PRN, Mayo, Pete Pelt, MD, 100 mg at 05/10/19 0004 .  guaiFENesin (MUCINEX) 12 hr tablet 600 mg, 600 mg, Oral, BID PRN, Seals, Angela H, NP, 600 mg at 05/12/19 2154 .  guaiFENesin-codeine 100-10 MG/5ML solution 10 mL, 10 mL, Oral, Q4H PRN, Mayo, Pete Pelt, MD, 10 mL at 05/04/19 0027 .  heparin injection 5,000 Units, 5,000 Units, Subcutaneous, Q8H, Mayo, Pete Pelt, MD, Stopped at 05/07/19 0600 .  insulin aspart (novoLOG) injection 0-9 Units, 0-9  Units, Subcutaneous, TID WC, Epifanio Lesches, MD, 2 Units at 05/10/19 1225 .  ipratropium-albuterol (DUONEB) 0.5-2.5 (3) MG/3ML nebulizer solution 3 mL, 3 mL, Nebulization, Q6H PRN, Mayo, Pete Pelt, MD, 3 mL at 05/13/19 0032 .  living well with diabetes book MISC, , Does not apply, Once, Vaughan Basta, MD .  metoCLOPramide (REGLAN) injection 5 mg, 5 mg, Intravenous, Q8H, Epifanio Lesches, MD, 5 mg at 05/13/19 0543 .  morphine 2 MG/ML  injection 2 mg, 2 mg, Intravenous, Q4H PRN, Lance Coon, MD, 2 mg at 05/13/19 0326 .  multivitamin (RENA-VIT) tablet 1 tablet, 1 tablet, Oral, QHS, Vaughan Basta, MD, 1 tablet at 05/12/19 2134 .  ondansetron (ZOFRAN) tablet 4 mg, 4 mg, Oral, Q6H PRN, 4 mg at 04/30/19 2332 **OR** ondansetron (ZOFRAN) injection 4 mg, 4 mg, Intravenous, Q6H PRN, Mayo, Pete Pelt, MD, 4 mg at 05/11/19 1823 .  oxyCODONE (Oxy IR/ROXICODONE) immediate release tablet 5 mg, 5 mg, Oral, Q6H PRN, Lance Coon, MD, 5 mg at 05/10/19 2232 .  pantoprazole (PROTONIX) injection 40 mg, 40 mg, Intravenous, Q12H, Epifanio Lesches, MD, 40 mg at 05/12/19 2134 .  polyethylene glycol (MIRALAX / GLYCOLAX) packet 17 g, 17 g, Oral, Daily PRN, Mayo, Pete Pelt, MD, 17 g at 05/07/19 1538 .  polyvinyl alcohol (LIQUIFILM TEARS) 1.4 % ophthalmic solution 1 drop, 1 drop, Right Eye, TID, Demetrios Loll, MD, 1 drop at 05/12/19 1449 .  sodium zirconium cyclosilicate (LOKELMA) packet 10 g, 10 g, Oral, Daily, Epifanio Lesches, MD, 10 g at 05/12/19 0958 .  tobramycin (TOBREX) 0.3 % ophthalmic solution 1 drop, 1 drop, Both Eyes, Q6H, Mansy, Jan A, MD, 1 drop at 05/13/19 0544 .  umeclidinium-vilanterol (ANORO ELLIPTA) 62.5-25 MCG/INH 1 puff, 1 puff, Inhalation, Daily, Mayo, Pete Pelt, MD, 1 puff at 05/12/19 [id number]   Social History: Social History   Tobacco Use  . Smoking status: Current Every Day Smoker    Packs/day: 1.00    Types: Cigarettes  . Smokeless tobacco: Never Used  Substance Use Topics  . Alcohol use: Never    Frequency: Never  . Drug use: Yes    Types: Marijuana    Family Medical History: Family History  Problem Relation Age of Onset  . Hypertension Mother     Physical Examination: Vitals:   05/13/19 1415 05/13/19 1516  BP: (!) 124/59 139/69  Pulse: 74 74  Resp: 16   Temp: 98 F (36.7 C)   SpO2: 98% 94%     General: Patient is well developed, well nourished, calm, collected, and in no apparent  distress.  Psychiatric: Patient is non-anxious.  Head:  Pupils equal, round, and reactive to light.  ENT:  Oral mucosa appears well hydrated.  Neck:   Supple.  Full range of motion.  Respiratory: Patient is breathing without any difficulty.  Extremities: No edema.  Skin:   On exposed skin, there are no abnormal skin lesions.  NEUROLOGICAL:  General: In no acute distress.   Awake, alert, oriented to person, place, and time. Facial tone is symmetric. There is no pronator drift.  ROM of spine: Cervical spine: Limited due to pain.   Palpation of spine: Tenderness through left trapezius muscle.  Strength: Side Biceps Triceps Deltoid Interossei Grip Wrist Ext. Wrist Flex.  R 5 5 5 5 5 5 5   L 5 5 5 5 5 5 5    Side Iliopsoas Quads Hamstring PF DF EHL  R 5 5 5 5 5 5   L 5 5 5 5 5  5  Reflexes are 2+ and symmetric at the biceps, triceps, brachioradialis, patella and achilles.   Bilateral upper and lower extremity sensation is intact to light touch and pin prick.  Gait is normal.  Unable to perform tandem gait without assistance.  Hoffman's is absent.  Imaging: EXAM: MRI CERVICAL SPINE WITHOUT CONTRAST 05/11/2019  IMPRESSION: 1. Mild diffuse prevertebral edema extending from the skull base through C7-T1. Finding is nonspecific, but could reflect the sequelae of low-grade ligamentous injury/strain given the history of recent fall. No other evidence for acute infection within the cervical spine which would be the main differential consideration. No other acute traumatic injury identified. 2. Moderate multilevel cervical spondylolysis with resultant mild to moderate diffuse spinal stenosis at C3-4 through C6-7, most pronounced at C4-5. 3. Multifactorial degenerative changes with resultant moderate to severe bilateral foraminal narrowing at C4 through C7 as above. 4. Prominent facet degeneration with associated mild reactive marrow edema about the left C3-4 and right C4-5 facets,  which could contribute to underlying neck pain. 5. Diffusely decreased T1 signal within the visualized bone marrow, nonspecific, but suspected to related to patient's known history of anemia or smoking.   Assessment and Plan: Mr. Hollett is a pleasant 58 y.o. male with persistent neck pain after a fall.  Additionally complains of some symptoms that could be consistent with radicular complaints.  Advised that he may follow-up as an outpatient in [facility] neurosurgery to monitor symptoms.  Discharge to the hospital he was provided with pain medication and instructions for physical therapy.  Educated patient on radicular and myelopathy symptoms.  Advised to contact office for reevaluation if any of the symptoms appear or current symptoms persist/worsen.  Patient expressed understanding.  Marin Olp, PA-C Dept. of Neurosurgery

## 2019-05-13 NOTE — Progress Notes (Signed)
[facility]  ROUNDING NOTE   Subjective:  Patient due for hemodialysis today. Was eating breakfast this a.m.   Objective:  Vital signs in last 24 hours:  Temp:  [97.7 F (36.5 C)-98.4 F (36.9 C)] 97.7 F (36.5 C) (09/04 0851) Pulse Rate:  [71-75] 71 (09/04 0851) Resp:  [14-20] 14 (09/04 0851) BP: (141-161)/(63-73) 161/73 (09/04 0851) SpO2:  [91 %-99 %] 97 % (09/04 0851) Weight:  [66.3 kg] 66.3 kg (09/04 0309)  Weight change: -0.684 kg Filed Weights   05/11/19 1015 05/11/19 1315 05/13/19 0309  Weight: 67 kg 65.6 kg 66.3 kg    Intake/Output: I/O last 3 completed shifts: In: 41 [P.O.:720] Out: 0    Intake/Output this shift:  Total I/O In: 240 [P.O.:240] Out: -   Physical Exam: General: NAD  Head: Normocephalic, atraumatic. Moist oral mucosal membranes  Eyes: Anicteric   Neck: Supple   Lungs:  CTAB, normal effort  Heart: S1S2 no rubs  Abdomen:  Soft, nontender, BS present  Extremities: no peripheral edema.  Neurologic: Nonfocal, moving all four extremities  Skin: No lesions  Access: LUE AVF    Basic Metabolic Panel: Recent Labs  Lab 05/10/19 0510 05/10/19 1521 05/11/19 1535 05/13/19 0818  NA  --  133*  --   --   K  --  6.1* 4.6  --   CL  --  91*  --   --   CO2  --  28  --   --   GLUCOSE  --  187*  --   --   BUN  --  68*  --   --   CREATININE 8.61* 9.55*  --  10.06*  CALCIUM  --  8.4*  --   --     Liver Function Tests: Recent Labs  Lab 05/10/19 1521  AST 18  ALT 32  ALKPHOS 193*  BILITOT 0.3  PROT 6.6  ALBUMIN 2.9*   No results for input(s): LIPASE, AMYLASE in the last 168 hours. No results for input(s): AMMONIA in the last 168 hours.  CBC: Recent Labs  Lab 05/10/19 0510 05/13/19 0818  WBC 4.8 6.2  HGB 9.4* 9.8*  HCT 30.7* 32.0*  MCV 95.9 94.1  PLT 244 271    Cardiac Enzymes: No results for input(s): CKTOTAL, CKMB, CKMBINDEX, TROPONINI in the last 168 hours.  BNP: Invalid input(s): POCBNP  CBG: Recent Labs   Lab 05/09/19 1127 05/09/19 1644 05/10/19 0800 05/10/19 1152 05/11/19 1225  GLUCAP 86 172* 165* 174* 160*    Microbiology: Results for orders placed or performed during the hospital encounter of 04/29/19  SARS Coronavirus 2 [facility] order, Performed in [facility] hospital lab) Nasopharyngeal Nasopharyngeal Swab     Status: None   Collection Time: 04/29/19  6:03 PM   Specimen: Nasopharyngeal Swab  Result Value Ref Range Status   SARS Coronavirus 2 NEGATIVE NEGATIVE Final    Comment: (NOTE) If result is NEGATIVE SARS-CoV-2 target nucleic acids are NOT DETECTED. The SARS-CoV-2 RNA is generally detectable in upper and lower  respiratory specimens during the acute phase of infection. The lowest  concentration of SARS-CoV-2 viral copies this assay can detect is 250  copies / mL. A negative result does not preclude SARS-CoV-2 infection  and should not be used as the sole basis for treatment or other  patient management decisions.  A negative result may occur with  improper specimen collection / handling, submission of specimen other  than nasopharyngeal swab, presence of viral mutation(s) within the  areas targeted by this assay, and inadequate number of viral copies  (<250 copies / mL). A negative result must be combined with clinical  observations, patient history, and epidemiological information. If result is POSITIVE SARS-CoV-2 target nucleic acids are DETECTED. The SARS-CoV-2 RNA is generally detectable in upper and lower  respiratory specimens dur ing the acute phase of infection.  Positive  results are indicative of active infection with SARS-CoV-2.  Clinical  correlation with patient history and other diagnostic information is  necessary to determine patient infection status.  Positive results do  not rule out bacterial infection or co-infection with other viruses. If result is PRESUMPTIVE POSTIVE SARS-CoV-2 nucleic acids MAY BE PRESENT.   A presumptive positive result  was obtained on the submitted specimen  and confirmed on repeat testing.  While 2019 novel coronavirus  (SARS-CoV-2) nucleic acids may be present in the submitted sample  additional confirmatory testing may be necessary for epidemiological  and / or clinical management purposes  to differentiate between  SARS-CoV-2 and other Sarbecovirus currently known to infect humans.  If clinically indicated additional testing with an alternate test  methodology [phone]) is advised. The SARS-CoV-2 RNA is generally  detectable in upper and lower respiratory sp ecimens during the acute  phase of infection. The expected result is Negative. Fact Sheet for Patients:  StrictlyIdeas.no Fact Sheet for Healthcare Providers: BankingDealers.co.za This test is not yet approved or cleared by the [place] FDA and has been authorized for detection and/or diagnosis of SARS-CoV-2 by FDA under an Emergency Use Authorization (EUA).  This EUA will remain in effect (meaning this test can be used) for the duration of the COVID-19 declaration under Section 564(b)(1) of the Act, 21 U.S.C. section 360bbb-3(b)(1), unless the authorization is terminated or revoked sooner. Performed at [facility], [address] [place], [place] [address]   Bordetella pertussis PCR     Status: None   Collection Time: 04/29/19 11:27 PM   Specimen: Nasopharyngeal Swab  Result Value Ref Range Status   B pertussis, DNA Negative Negative Final   B parapertussis, DNA Negative Negative Final    Comment: (NOTE) This test was developed and its performance characteristics determined by [employer]. It has not been cleared or approved by the U.S. Food and Drug Administration. The FDA has determined that such clearance or approval is not necessary. This test is used for clinical purposes. It should not be regarded as investigational or research. Performed At: [facility] [place], [place] [id number] Rush Farmer MD [id number]     Coagulation Studies: No results for input(s): LABPROT, INR in the last 72 hours.  Urinalysis: No results for input(s): COLORURINE, LABSPEC, PHURINE, GLUCOSEU, HGBUR, BILIRUBINUR, KETONESUR, PROTEINUR, UROBILINOGEN, NITRITE, LEUKOCYTESUR in the last 72 hours.  Invalid input(s): APPERANCEUR    Imaging: Dg Cervical Spine With Flex & Extend  Result Date: 05/12/2019 CLINICAL DATA:  Syncopal episode 1 week ago now with neck pain and left shoulder/arm pain. EXAM: CERVICAL SPINE COMPLETE WITH FLEXION AND EXTENSION VIEWS COMPARISON:  April 29, 2019.  MRI dated 05/11/2019. FINDINGS: There is no significant prevertebral soft tissue swelling. There are multilevel degenerative changes throughout the cervical spine, greatest at the C4-C5 and C5-C6 levels. There is no acute displaced fracture. No dislocation. There is no significant dynamic listhesis. Multilevel osseous neural foraminal narrowing is again noted. This is better visualized on prior CT and MRI. Carotid artery calcifications are noted. Poor dentition is noted. IMPRESSION: 1. No acute osseous abnormality. 2.  Multilevel degenerative changes as detailed above, better appreciated on recent MRI of the cervical spine. 3. No evidence for significant dynamic listhesis on the flexion and extension views. Electronically Signed   By: Constance Holster M.D.   On: 05/12/2019 20:21   Mr Cervical Spine Wo Contrast  Result Date: 05/11/2019 CLINICAL DATA:  Initial evaluation for left-sided neck pain, recent fall 10 days ago. EXAM: MRI CERVICAL SPINE WITHOUT CONTRAST TECHNIQUE: Multiplanar, multisequence MR imaging of the cervical spine was performed. No intravenous contrast was administered. COMPARISON:  Prior CT from 04/29/2019. FINDINGS: Alignment: Straightening with mild reversal of the normal cervical lordosis, apex at C4-5. No listhesis. Vertebrae: Vertebral body height  maintained without evidence for acute or chronic fracture. Prominent endplate Schmorl's node with associated mild reactive marrow edema noted at the superior endplate of C7, also seen on prior CT. Reactive endplate changes present about the C4-5 and C5-6 interspaces as well. Mild marrow edema about the right C3-4 and left C4-5 facets due to facet arthritis. Underlying bone marrow signal intensity diffusely decreased on T1 weighted imaging, nonspecific, but suspected to be related to patient's history of anemia or smoking. No discrete or worrisome osseous lesions. Cord: Signal intensity within the cervical spinal cord is within normal limits. Posterior Fossa, vertebral arteries, paraspinal tissues: Visualized brain and posterior fossa normal. Craniocervical junction within normal limits. Mild diffuse prevertebral edema extending from the skull base through approximately C7-T1 (series 7, image 7). Finding is nonspecific, but could reflect sequelae of low-grade ligamentous injury/strain given history of recent fall. No other findings to suggest acute infection within the cervical spine which would be the main differential consideration. Normal intravascular flow voids seen within the left vertebral artery. Right vertebral artery markedly hypoplastic and not well seen. Disc levels: C2-C3: Negative interspace. Mild left-sided facet hypertrophy. No canal or foraminal stenosis. C3-C4: Diffuse degenerative disc osteophyte with intervertebral disc space narrowing. Bilateral facet degeneration. Flattening of the ventral thecal sac with resultant mild spinal stenosis. Severe right with moderate left C4 foraminal stenosis. C4-C5: Diffuse degenerative disc osteophyte with intervertebral disc space narrowing. Broad posterior component effaces the ventral thecal sac resultant moderate spinal stenosis. Mild cord flattening without cord signal changes. Severe bilateral C5 foraminal narrowing, worse on the right. C5-C6: Diffuse disc  osteophyte with intervertebral disc space narrowing. Broad posterior component flattens and effaces the ventral thecal sac, with a slightly more focal left paracentral component (series 8, image 17). Resultant mild spinal stenosis without significant cord deformity. Severe right with moderate left C6 foraminal stenosis. C6-C7: Mild disc bulge with uncovertebral hypertrophy. Disc bulging asymmetric to the left. Mild bilateral facet hypertrophy. Resultant mild spinal stenosis with moderate left greater than right C7 foraminal narrowing. C7-T1: Negative interspace. Right greater than left facet hypertrophy. No stenosis. Visualized upper thoracic spine demonstrates no significant finding. IMPRESSION: 1. Mild diffuse prevertebral edema extending from the skull base through C7-T1. Finding is nonspecific, but could reflect the sequelae of low-grade ligamentous injury/strain given the history of recent fall. No other evidence for acute infection within the cervical spine which would be the main differential consideration. No other acute traumatic injury identified. 2. Moderate multilevel cervical spondylolysis with resultant mild to moderate diffuse spinal stenosis at C3-4 through C6-7, most pronounced at C4-5. 3. Multifactorial degenerative changes with resultant moderate to severe bilateral foraminal narrowing at C4 through C7 as above. 4. Prominent facet degeneration with associated mild reactive marrow edema about the left C3-4 and right C4-5 facets, which could contribute to underlying neck pain.  5. Diffusely decreased T1 signal within the visualized bone marrow, nonspecific, but suspected to related to patient's known history of anemia or smoking. Electronically Signed   By: Jeannine Boga M.D.   On: 05/11/2019 23:36     Medications:    . amiodarone  200 mg Oral BID  . benzonatate  200 mg Oral TID  . calcium acetate  667 mg Oral TID WC  . Chlorhexidine Gluconate Cloth  6 each Topical Daily  .  cinacalcet  30 mg Oral 3 times weekly  . epoetin (EPOGEN/PROCRIT) injection  4,000 Units Intravenous Q M,W,F-HD  . feeding supplement (NEPRO CARB STEADY)  237 mL Oral BID BM  . fluticasone  2 spray Each Nare Daily  . heparin  5,000 Units Subcutaneous Q8H  . insulin aspart  0-9 Units Subcutaneous TID WC  . living well with diabetes book   Does not apply Once  . metoCLOPramide (REGLAN) injection  5 mg Intravenous Q8H  . multivitamin  1 tablet Oral QHS  . pantoprazole (PROTONIX) IV  40 mg Intravenous Q12H  . polyvinyl alcohol  1 drop Right Eye TID  . sodium zirconium cyclosilicate  10 g Oral Daily  . tobramycin  1 drop Both Eyes Q6H  . umeclidinium-vilanterol  1 puff Inhalation Daily   acetaminophen **OR** acetaminophen, albuterol, cyclobenzaprine, diphenhydrAMINE, gabapentin, guaiFENesin, guaiFENesin-codeine, ipratropium-albuterol, morphine injection, ondansetron **OR** ondansetron (ZOFRAN) IV, oxyCODONE, polyethylene glycol  Assessment/ Plan:  [name] is a 58 y.o. black male with end stage renal disease on hemodialysis, hypertension, congestive heart failure, diabetes mellitus type II, history of substance abuse, COPD with ongoing tobacco use  [facility] MWF 65kg 180 minutes 2K bath  1. End stage renal disease with hyperkalemia: Patient due for hemodialysis today.  Orders have been prepared.  Potassium at last check was 4.6.  2. Syncope: diagnosed as post-tussive syncope at [facility].   Hypotensive on this admission.  Atrial fibrillation with rapid ventricular response treated with amiodarone.   -Patient has been evaluated by cardiology for this issue.  3. Anemia of chronic kidney disease:  -  Lab Results  Component Value Date   HGB 9.8 (L) 05/13/2019   -Hemoglobin 9.8.  Continue Epogen 4000 IV with dialysis treatments.  4. Secondary Hyperparathyroidism:  Lab Results  Component Value Date   PTH 708 (H) 11/24/2018   CALCIUM 8.4 (L) 05/10/2019   PHOS 6.3 (H)  11/24/2018   -Recheck serum phosphorus with dialysis treatment today.   LOS: 13 Jeremy [name] 9/4/202010:29 AM

## 2019-05-13 NOTE — Progress Notes (Signed)
Jeremy [name] to be D/C'd Home with home health per MD order.  Discussed prescriptions and follow up appointments with the patient. Prescriptions given to patient, medication list explained in detail. Pt verbalized understanding.  Allergies as of 05/13/2019   No Known Allergies     Medication List    STOP taking these medications   sacubitril-valsartan 24-26 MG Commonly known as: ENTRESTO     TAKE these medications   albuterol 108 (90 Base) MCG/ACT inhaler Commonly known as: VENTOLIN HFA Inhale 2 puffs into the lungs every 6 (six) hours as needed for wheezing or shortness of breath.   amiodarone 200 MG tablet Commonly known as: PACERONE Take 1 tablet (200 mg total) by mouth 2 (two) times daily.   benzonatate 200 MG capsule Commonly known as: TESSALON Take 1 capsule (200 mg total) by mouth 3 (three) times daily.   Budesonide 90 MCG/ACT inhaler Inhale 1 puff into the lungs 2 (two) times daily.   calcium acetate 667 MG capsule Commonly known as: PHOSLO Take 667 mg by mouth 3 (three) times daily with meals.   cinacalcet 30 MG tablet Commonly known as: SENSIPAR Take 30 mg by mouth 3 (three) times a week.   feeding supplement (NEPRO CARB STEADY) Liqd Take 237 mLs by mouth 2 (two) times daily between meals for 15 days.   fluticasone 50 MCG/ACT nasal spray Commonly known as: FLONASE Place 2 sprays into both nostrils daily.   gabapentin 100 MG capsule Commonly known as: NEURONTIN Take 1 capsule (100 mg total) by mouth daily as needed (pain). What changed:   how much to take  when to take this   guaiFENesin-codeine 100-10 MG/5ML syrup Take 10 mLs by mouth every 4 (four) hours as needed for cough.   levofloxacin 500 MG tablet Commonly known as: Levaquin Take 1 tablet (500 mg total) by mouth every other day for 2 doses.   metoCLOPramide 5 MG tablet Commonly known as: Reglan Take 1 tablet (5 mg total) by mouth 3 (three) times daily before meals for 7 days.   multivitamin  with minerals Tabs tablet Take 1 tablet by mouth daily.   nicotine 21 mg/24hr patch Commonly known as: NICODERM CQ - dosed in mg/24 hours Place 1 patch onto the skin daily.   nicotine polacrilex 4 MG gum Commonly known as: NICORETTE Take 4 mg by mouth as directed.   oxyCODONE 5 MG immediate release tablet Commonly known as: Oxy IR/ROXICODONE Take 1 tablet (5 mg total) by mouth every 6 (six) hours as needed for moderate pain.   pantoprazole 40 MG tablet Commonly known as: PROTONIX Take 1 tablet (40 mg total) by mouth daily.   sodium zirconium cyclosilicate 10 g Pack packet Commonly known as: LOKELMA Take 10 g by mouth daily.   umeclidinium-vilanterol 62.5-25 MCG/INH Aepb Commonly known as: ANORO ELLIPTA Inhale 1 puff into the lungs daily.       Vitals:   05/13/19 1415 05/13/19 1516  BP: (!) 124/59 139/69  Pulse: 74 74  Resp: 16   Temp: 98 F (36.7 C)   SpO2: 98% 94%    Skin clean, dry and intact without evidence of skin break down, no evidence of skin tears noted. IV catheter discontinued intact. Site without signs and symptoms of complications. Dressing and pressure applied. Pt denies pain at this time. No complaints noted.  An After Visit Summary was printed and given to the patient. Patient escorted via [facility], and D/C home via private auto.  Rolley Sims

## 2019-05-13 NOTE — Progress Notes (Signed)
Pre HD TX    05/13/19 1045  Hand-Off documentation  Report given to (Full Name) Beatris Ship, RN   Report received from (Full Name) Stacie Glaze, RN   Vital Signs  Temp 97.8 F (36.6 C)  Temp Source Oral  Pulse Rate 70  Pulse Rate Source Monitor  BP (!) 156/67  Oxygen Therapy  SpO2 98 %  O2 Device Nasal Cannula  O2 Flow Rate (L/min) 2 L/min  Pain Assessment  Pain Scale 0-10  Pain Score 0  Dialysis Weight  Weight 63.5 kg  Type of Weight Pre-Dialysis  Time-Out for Hemodialysis  What Procedure? HD  Pt Identifiers(min of two) First/Last Name;MRN/Account#  Correct Site? Yes  Correct Side? Yes  Correct Procedure? Yes  Consents Verified? Yes  Rad Studies Available? N/A  Safety Precautions Reviewed? Yes  Engineer, civil (consulting) Number 5  Station Number 4  UF/Alarm Test Passed  Conductivity: Meter 14  Conductivity: Machine  14  pH 7.4  Reverse Osmosis main  Normal Saline Lot Number [id number]  Dialyzer Lot Number [id number]  Disposable Set Lot Number [id number]  Machine Temperature 98.6 F (37 C)  Musician and Audible Yes  Blood Lines Intact and Secured Yes  Pre Treatment Patient Checks  Vascular access used during treatment Fistula  Hepatitis B Surface Antigen Results Negative  Date Hepatitis B Surface Antigen Drawn 07/09/18  Hepatitis B Surface Antibody 53  Date Hepatitis B Surface Antibody Drawn 07/09/18  Hemodialysis Consent Verified Yes  Hemodialysis Standing Orders Initiated Yes  ECG (Telemetry) Monitor On No (Pt refused, MD is aware )  Prime Ordered Normal Saline  Length of  DialysisTreatment -hour(s) 3 Hour(s) (Verbal Order change, pt requested 3hr TX, MD approved )  Dialysis Treatment Comments Na 140  Dialyzer Elisio 17H NR  Dialysate 2K;2.5 Ca  Dialysis Anticoagulant None  Dialysate Flow Ordered 800  Blood Flow Rate Ordered 400 mL/min  Ultrafiltration Goal 3 Liters (Pt request to remove 3L, MD approved )  Pre Treatment Labs Phosphorus  Dialysis  Blood Pressure Support Ordered Normal Saline  Education / Care Plan  Dialysis Education Provided Yes  Documented Education in Care Plan Yes  Fistula / Graft Left Upper arm  Placement Date: 05/17/18   Placed prior to admission: Yes  Orientation: Left  Access Location: Upper arm  Site Condition No complications  Fistula / Graft Assessment Present;Thrill;Bruit  Status Patent  Drainage Description None

## 2019-05-13 NOTE — Progress Notes (Signed)
HD Completed:    05/13/19 1400  Vital Signs  Temp 98 F (36.7 C)  Temp Source Oral  Pulse Rate 76  Pulse Rate Source Dinamap  Resp 16  BP (!) 134/59  BP Location Right Arm  BP Method Automatic  Patient Position (if appropriate) Lying  Oxygen Therapy  SpO2 99 %  O2 Device Nasal Cannula  O2 Flow Rate (L/min) 2 L/min  Pain Assessment  Pain Scale 0-10  Pain Score 0  During Hemodialysis Assessment  Blood Flow Rate (mL/min) 0 mL/min  HD Safety Checks Performed Yes  KECN 73.4 KECN  Dialysis Fluid Bolus Normal Saline  Bolus Amount (mL) 250 mL  Intra-Hemodialysis Comments Tx completed

## 2019-05-13 NOTE — Discharge Summary (Signed)
Elzia Klase, is a 58 y.o. male  DOB 06/04/1961  MRN [id number].  Admission date:  04/29/2019  Admitting Physician  Sela Hua, MD  Discharge Date:  05/13/2019   Primary MD  Patient, No Pcp Per  Recommendations for primary care physician for things to follow:   Follow-up with PCP in 1 week Patient has a primary cardiologist, primary pulmonologist at [facility], advised him to follow-up with him.   Admission Diagnosis  Syncope and collapse [R55] Abnormal EKG [R94.31] Elevated troponin [R79.89]   Discharge Diagnosis  Syncope and collapse [R55] Abnormal EKG [R94.31] Elevated troponin [R79.89]    Active Problems:   Syncope   Atrial fibrillation with rapid ventricular response (HCC)      Past Medical History:  Diagnosis Date  . Bronchitis   . Hypertension   . Renal disorder    kidney failure, dialysis    Past Surgical History:  Procedure Laterality Date  . DIALYSIS FISTULA CREATION         History of present illness and  Hospital Course:     Kindly see H&P for history of present illness and admission details, please review complete Labs, Consult reports and Test reports for all details in brief  HPI  from the history and physical done on the day of admission 58 year old gentleman with multiple medical problems of ESRD on hemodialysis, chronic systolic heart failure with EF 45% with severe mitral regurgitation, patient followed by [facility], admitted because of syncope.   Hospital Course  #1 syncope likely Syncope, patient medical record showed he was admitted to [facility] from August 13 August 15 for the same, with negative syncope work-up. Patient had a fall in the hospital, CT head, MRI of the brain were done, patient MRI of the brain showed encephalomalacia, small chronic lacunar infarcts, patient is seen by neurology,  they recommended no further work-up and postconcussion symptoms-to improve in 10 days, recommended rest.  Patient had MRI of cervical spine which showed mild prevertebral edema at C7 and T1 which are nonspecific calling neurosurgery, discussed MRI of the T-spine results with Dr. Cari Caraway, he ordered x-rays of cervical spine flexion and extension views which are normal so Dr. Cari Caraway said no neurosurgical intervention is recommended and no follow-ups are needed and his PA will write a note .  Told the same to the patient.  Patient does have left shoulder pain can use physical therapy at home, PRN narcotics. 3.  Atrial fibrillation with RVR, heart rate controlled, patient was on amiodarone drip but changed to tolerate oral amiodarone. 4.  ESRD on hemodialysis.  Nephrology consulted, dialysis as per them. 5.  Acute respiratory failure with hypoxia secondary to acute on chronic congestive heart failure, history of severe mitral regurgitation, chordae tendineae rupture and this is documented in [facility] records, patient acute respiratory failure improved, oxygen saturation 96%.  Discontinue Entresto because of hyperkalemia. 6.  Hyperkalemia because of that Entresto is discontinued, patient received Lokelma. 7 nausea, vomiting continue PPIs, Reglan.  Patient nausea improved after starting Reglan,   #8 diabetes mellitus type 2, hemoglobin A1c more than 7 but patient keeps refusing to take sliding scale insulin in the hospital thinks that prednisone caused him to have diabetes  #9 pneumonia, patient is on Levaquin 500 mg every 48 hours.,  Continue for 2 more doses.  Discharge Condition: Stable  Patient high risk for readmission, patient noncompliant with insulin, telemetry monitor, patient also requested to eat regular food while in the hospital even though we told  him we cannot give regular food due to his ESRD, heart failure Follow UP  Follow-up with cardiologist at [facility],  pulmonologist.   Discharge Instructions  and  Discharge Medications      Allergies as of 05/13/2019   No Known Allergies     Medication List    STOP taking these medications   sacubitril-valsartan 24-26 MG Commonly known as: ENTRESTO     TAKE these medications   albuterol 108 (90 Base) MCG/ACT inhaler Commonly known as: VENTOLIN HFA Inhale 2 puffs into the lungs every 6 (six) hours as needed for wheezing or shortness of breath.   amiodarone 200 MG tablet Commonly known as: PACERONE Take 1 tablet (200 mg total) by mouth 2 (two) times daily.   benzonatate 200 MG capsule Commonly known as: TESSALON Take 1 capsule (200 mg total) by mouth 3 (three) times daily.   Budesonide 90 MCG/ACT inhaler Inhale 1 puff into the lungs 2 (two) times daily.   calcium acetate 667 MG capsule Commonly known as: PHOSLO Take 667 mg by mouth 3 (three) times daily with meals.   cinacalcet 30 MG tablet Commonly known as: SENSIPAR Take 30 mg by mouth 3 (three) times a week.   feeding supplement (NEPRO CARB STEADY) Liqd Take 237 mLs by mouth 2 (two) times daily between meals for 15 days.   fluticasone 50 MCG/ACT nasal spray Commonly known as: FLONASE Place 2 sprays into both nostrils daily.   gabapentin 100 MG capsule Commonly known as: NEURONTIN Take 1 capsule (100 mg total) by mouth daily as needed (pain). What changed:   how much to take  when to take this   guaiFENesin-codeine 100-10 MG/5ML syrup Take 10 mLs by mouth every 4 (four) hours as needed for cough.   levofloxacin 500 MG tablet Commonly known as: Levaquin Take 1 tablet (500 mg total) by mouth every other day for 2 doses.   metoCLOPramide 5 MG tablet Commonly known as: Reglan Take 1 tablet (5 mg total) by mouth 3 (three) times daily before meals for 7 days.   multivitamin with minerals Tabs tablet Take 1 tablet by mouth daily.   nicotine 21 mg/24hr patch Commonly known as: NICODERM CQ - dosed in mg/24 hours Place  1 patch onto the skin daily.   nicotine polacrilex 4 MG gum Commonly known as: NICORETTE Take 4 mg by mouth as directed.   oxyCODONE 5 MG immediate release tablet Commonly known as: Oxy IR/ROXICODONE Take 1 tablet (5 mg total) by mouth every 6 (six) hours as needed for moderate pain.   pantoprazole 40 MG tablet Commonly known as: PROTONIX Take 1 tablet (40 mg total) by mouth daily.   sodium zirconium cyclosilicate 10 g Pack packet Commonly known as: LOKELMA Take 10 g by mouth daily.   umeclidinium-vilanterol 62.5-25 MCG/INH Aepb Commonly known as: ANORO ELLIPTA Inhale 1 puff into the lungs daily.         Diet and Activity recommendation: See Discharge Instructions above   Consults obtained ; nephrology, neurology, neurosurgery Major procedures and Radiology Reports - PLEASE review detailed and final reports for al; l details, in brief -     Dg Chest 2 View  Result Date: 05/05/2019 CLINICAL DATA:  Cough and shortness of breath EXAM: CHEST - 2 VIEW COMPARISON:  05/03/2019 prior radiographs FINDINGS: Cardiomegaly and pulmonary vascular congestion again noted. LEFT LOWER lung opacity is unchanged. No pneumothorax or RIGHT pleural effusion. LEFT axillary stent again noted. IMPRESSION: Unchanged appearance of the chest with cardiomegaly, pulmonary  vascular congestion and continued LEFT LOWER lung opacity which may represent airspace disease, atelectasis and/or effusion. Electronically Signed   By: Margarette Canada M.D.   On: 05/05/2019 14:44   Dg Chest 2 View  Result Date: 04/30/2019 CLINICAL DATA:  Cough and shortness of breath EXAM: CHEST - 2 VIEW COMPARISON:  Chest radiograph 04/29/2019 FINDINGS: Monitoring lead overlies the patient. Stable cardiomegaly. Mild bilateral interstitial pulmonary opacities. Retrocardiac consolidation. Possible small left pleural effusion. No pneumothorax. Stent material over the left axilla. IMPRESSION: Cardiomegaly with pulmonary vascular redistribution  and mild interstitial edema. Retrocardiac consolidation may represent small left pleural effusion and underlying atelectasis or infection. Electronically Signed   By: Lovey Newcomer M.D.   On: 04/30/2019 11:04   Dg Chest 2 View  Result Date: 04/20/2019 CLINICAL DATA:  Chest pain EXAM: CHEST - 2 VIEW COMPARISON:  04/02/2019 FINDINGS: Cardiomegaly. No pleural effusion. Further clearing of right lung base infiltrate. Streaky basilar opacities, likely atelectasis. Aortic atherosclerosis. No pneumothorax. Left subclavian region stent. IMPRESSION: 1. Cardiomegaly without edema or pleural effusion 2. Further clearing of previously noted right lower lobe infiltrate Electronically Signed   By: Donavan Foil M.D.   On: 04/20/2019 17:11   Dg Cervical Spine With Flex & Extend  Result Date: 05/12/2019 CLINICAL DATA:  Syncopal episode 1 week ago now with neck pain and left shoulder/arm pain. EXAM: CERVICAL SPINE COMPLETE WITH FLEXION AND EXTENSION VIEWS COMPARISON:  April 29, 2019.  MRI dated 05/11/2019. FINDINGS: There is no significant prevertebral soft tissue swelling. There are multilevel degenerative changes throughout the cervical spine, greatest at the C4-C5 and C5-C6 levels. There is no acute displaced fracture. No dislocation. There is no significant dynamic listhesis. Multilevel osseous neural foraminal narrowing is again noted. This is better visualized on prior CT and MRI. Carotid artery calcifications are noted. Poor dentition is noted. IMPRESSION: 1. No acute osseous abnormality. 2. Multilevel degenerative changes as detailed above, better appreciated on recent MRI of the cervical spine. 3. No evidence for significant dynamic listhesis on the flexion and extension views. Electronically Signed   By: Constance Holster M.D.   On: 05/12/2019 20:21   Ct Head Wo Contrast  Result Date: 05/03/2019 CLINICAL DATA:  [place] 2 days ago with headache and vomiting since then. EXAM: CT HEAD WITHOUT CONTRAST TECHNIQUE:  Contiguous axial images were obtained from the base of the skull through the vertex without intravenous contrast. COMPARISON:  04/29/2019 FINDINGS: Brain: Mild generalized volume loss. Chronic small-vessel ischemic changes of the white matter. 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 No sign of recent infarction, mass lesion, hemorrhage, hydrocephalus or extra-axial collection. Vascular: There is atherosclerotic calcification of the major vessels at the base of the brain. Skull: Negative Sinuses/Orbits: Clear/normal Other: None IMPRESSION: No acute or traumatic finding. Extensive vascular calcification of the major vessels at the base of the brain. Chronic small-vessel ischemic changes of the white matter. 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 Electronically Signed   By: Nelson Chimes M.D.   On: 05/03/2019 18:45   Ct Head Wo Contrast  Result Date: 04/29/2019 CLINICAL DATA:  Head trauma, syncopal episode EXAM: CT HEAD WITHOUT CONTRAST TECHNIQUE: Contiguous axial images were obtained from the base of the skull through the vertex without intravenous contrast. COMPARISON:  02/14/2019 FINDINGS: Brain: No evidence of acute infarction, hemorrhage, extra-axial collection, ventriculomegaly, or mass effect. Generalized cerebral atrophy. Periventricular white matter low attenuation likely secondary to microangiopathy. Vascular: Cerebrovascular atherosclerotic calcifications  are noted. Skull: Negative for fracture or focal lesion. Sinuses/Orbits: Visualized portions of the orbits are unremarkable. Visualized portions of the paranasal sinuses and mastoid air cells are unremarkable. Other: None. IMPRESSION: No acute intracranial pathology. Electronically Signed   By: Kathreen Devoid   On: 04/29/2019 16:46   Ct Cervical Spine Wo Contrast  Result Date: 04/29/2019 CLINICAL DATA:  Cervical spine trauma due to syncope. Right-sided neck pain  EXAM: CT CERVICAL SPINE WITHOUT CONTRAST TECHNIQUE: Multidetector CT imaging of the cervical spine was performed without intravenous contrast. Multiplanar CT image reconstructions were also generated. COMPARISON:  None. FINDINGS: Alignment: Levocurvature.  No listhesis. Skull base and vertebrae: No acute fracture Soft tissues and spinal canal: No prevertebral fluid or swelling. No visible canal hematoma. Extensive atherosclerosis; there is history of end-stage renal disease. Disc levels: Generalized disc degeneration with large Schmorl's nodes in the C4 and C7 vertebrae. Facet arthropathy with erosive features or subchondral cystic change. Disc narrowing and uncovertebral spurring causes mid cervical foraminal narrowing most notable on the right at C4-5. Upper chest: Patchy air trapping IMPRESSION: Negative for fracture. Multilevel degenerative disease. Electronically Signed   By: Monte Fantasia M.D.   On: 04/29/2019 19:16   Mr Brain Wo Contrast  Result Date: 05/10/2019 CLINICAL DATA:  Vertigo, persistent, central. Additional history: Patient complains of nausea EXAM: MRI HEAD WITHOUT CONTRAST TECHNIQUE: Multiplanar, multiecho pulse sequences of the brain and surrounding structures were obtained without intravenous contrast. COMPARISON:  Head CT 05/03/2019 FINDINGS: Brain: Multiple sequences are motion degraded. No restricted diffusion is demonstrated to suggest acute or recent subacute infarction. No evidence of intracranial mass. No midline shift or extra-axial fluid collection. Mild scattered T2/FLAIR hyperintensity within the cerebral white matter is nonspecific, but consistent with chronic small vessel ischemic disease. Small chronic lacunar infarcts within the left corona radiata/basal ganglia, left caudate nucleus and superior left cerebellum. Additionally, a small focus of chronic encephalomalacia is questioned within the inferomedial right cerebellum (series 15, image 12). Punctate chronic  microhemorrhage within the right periatrial white matter. Parenchymal volume is age-appropriate. Vascular: Flow voids maintained within the proximal large arterial vessels. Skull and upper cervical spine: Normal calvarial marrow signal. Nonspecific T1 hypointense marrow signal within the visualized cervical spine. Sinuses/Orbits: Imaged globes and orbits demonstrate no acute abnormality. Mild scattered paranasal sinus mucosal thickening. Left mastoid effusion. IMPRESSION: Motion degraded exam. No evidence of acute intracranial abnormality. A small focus of chronic encephalomalacia is questioned within the inferomedial right cerebellum, indeterminate in etiology. Mild chronic small vessel ischemic disease with small chronic lacunar infarcts in the left corona radiata/basal ganglia, left caudate nucleus and superior left cerebellum. Left mastoid effusion. T1 hypointense marrow signal within the visualized cervical spine. Findings are nonspecific but may be seen in the setting of chronic anemia or a marrow infiltrative process. Clinical correlation is recommended. Electronically Signed   By: Kellie Simmering   On: 05/10/2019 10:23   Mr Cervical Spine Wo Contrast  Result Date: 05/11/2019 CLINICAL DATA:  Initial evaluation for left-sided neck pain, recent fall 10 days ago. EXAM: MRI CERVICAL SPINE WITHOUT CONTRAST TECHNIQUE: Multiplanar, multisequence MR imaging of the cervical spine was performed. No intravenous contrast was administered. COMPARISON:  Prior CT from 04/29/2019. FINDINGS: Alignment: Straightening with mild reversal of the normal cervical lordosis, apex at C4-5. No listhesis. Vertebrae: Vertebral body height maintained without evidence for acute or chronic fracture. Prominent endplate Schmorl's node with associated mild reactive marrow edema noted at the superior endplate of C7, also seen on prior CT. Reactive  endplate changes present about the C4-5 and C5-6 interspaces as well. Mild marrow edema about the  right C3-4 and left C4-5 facets due to facet arthritis. Underlying bone marrow signal intensity diffusely decreased on T1 weighted imaging, nonspecific, but suspected to be related to patient's history of anemia or smoking. No discrete or worrisome osseous lesions. Cord: Signal intensity within the cervical spinal cord is within normal limits. Posterior Fossa, vertebral arteries, paraspinal tissues: Visualized brain and posterior fossa normal. Craniocervical junction within normal limits. Mild diffuse prevertebral edema extending from the skull base through approximately C7-T1 (series 7, image 7). Finding is nonspecific, but could reflect sequelae of low-grade ligamentous injury/strain given history of recent fall. No other findings to suggest acute infection within the cervical spine which would be the main differential consideration. Normal intravascular flow voids seen within the left vertebral artery. Right vertebral artery markedly hypoplastic and not well seen. Disc levels: C2-C3: Negative interspace. Mild left-sided facet hypertrophy. No canal or foraminal stenosis. C3-C4: Diffuse degenerative disc osteophyte with intervertebral disc space narrowing. Bilateral facet degeneration. Flattening of the ventral thecal sac with resultant mild spinal stenosis. Severe right with moderate left C4 foraminal stenosis. C4-C5: Diffuse degenerative disc osteophyte with intervertebral disc space narrowing. Broad posterior component effaces the ventral thecal sac resultant moderate spinal stenosis. Mild cord flattening without cord signal changes. Severe bilateral C5 foraminal narrowing, worse on the right. C5-C6: Diffuse disc osteophyte with intervertebral disc space narrowing. Broad posterior component flattens and effaces the ventral thecal sac, with a slightly more focal left paracentral component (series 8, image 17). Resultant mild spinal stenosis without significant cord deformity. Severe right with moderate left C6  foraminal stenosis. C6-C7: Mild disc bulge with uncovertebral hypertrophy. Disc bulging asymmetric to the left. Mild bilateral facet hypertrophy. Resultant mild spinal stenosis with moderate left greater than right C7 foraminal narrowing. C7-T1: Negative interspace. Right greater than left facet hypertrophy. No stenosis. Visualized upper thoracic spine demonstrates no significant finding. IMPRESSION: 1. Mild diffuse prevertebral edema extending from the skull base through C7-T1. Finding is nonspecific, but could reflect the sequelae of low-grade ligamentous injury/strain given the history of recent fall. No other evidence for acute infection within the cervical spine which would be the main differential consideration. No other acute traumatic injury identified. 2. Moderate multilevel cervical spondylolysis with resultant mild to moderate diffuse spinal stenosis at C3-4 through C6-7, most pronounced at C4-5. 3. Multifactorial degenerative changes with resultant moderate to severe bilateral foraminal narrowing at C4 through C7 as above. 4. Prominent facet degeneration with associated mild reactive marrow edema about the left C3-4 and right C4-5 facets, which could contribute to underlying neck pain. 5. Diffusely decreased T1 signal within the visualized bone marrow, nonspecific, but suspected to related to patient's known history of anemia or smoking. Electronically Signed   By: Jeannine Boga M.D.   On: 05/11/2019 23:36   Dg Chest Port 1 View  Result Date: 05/09/2019 CLINICAL DATA:  Pneumonia. EXAM: PORTABLE CHEST 1 VIEW COMPARISON:  Two-view chest x-ray 05/05/2019 FINDINGS: Heart is enlarged. Atherosclerotic changes are noted at the aortic arch. Mild pulmonary vascular congestion is present. Persistent left lower lobe airspace disease is present. Linear airspace disease at the right base is new. Left axillary stent is in place. The visualized soft tissues and bony thorax are otherwise unremarkable.  IMPRESSION: 1. Persistent left lower lobe pneumonia. 2. Linear density at the right base likely reflects atelectasis. 3. Cardiomegaly and mild pulmonary vascular congestion. Electronically Signed   By: Harrell Gave  Mattern M.D.   On: 05/09/2019 05:58   Dg Chest Port 1 View  Result Date: 05/03/2019 CLINICAL DATA:  58 year old male with syncope. EXAM: PORTABLE CHEST 1 VIEW COMPARISON:  Chest radiograph dated 04/30/2019 FINDINGS: Left lung base opacity likely combination of a small pleural effusion and associated atelectasis although infiltrate is not excluded. Clinical correlation is recommended. Bilateral streaky densities may represent atelectasis or mild congestion. There is no pneumothorax. Stable cardiomegaly. Atherosclerotic calcification of the aorta. No acute osseous pathology. IMPRESSION: 1. Probable small left pleural effusion and left lung base atelectasis or infiltrate. 2. Cardiomegaly with possible mild vascular congestion. Electronically Signed   By: Anner Crete M.D.   On: 05/03/2019 21:06   Dg Chest Portable 1 View  Result Date: 04/29/2019 CLINICAL DATA:  Syncope, shortness of breath, cough EXAM: PORTABLE CHEST 1 VIEW COMPARISON:  04/20/2019 FINDINGS: Cardiomegaly, vascular congestion. Increasing left basilar atelectasis or infiltrate. No overt edema. No effusions or acute bony abnormality. IMPRESSION: Cardiomegaly, vascular congestion. Increasing left basilar atelectasis or infiltrate. Electronically Signed   By: Rolm Baptise M.D.   On: 04/29/2019 16:51   Dg Shoulder Left  Result Date: 04/29/2019 CLINICAL DATA:  Left shoulder pain after syncope, fall. EXAM: LEFT SHOULDER - 2+ VIEW COMPARISON:  None. FINDINGS: There is no evidence of fracture or dislocation. Minimal glenohumeral osteoarthritis. There is no evidence of arthropathy or other focal bone abnormality. Soft tissues are unremarkable. Vascular stent in the left axilla. IMPRESSION: No fracture or malalignment of the left  shoulder. Electronically Signed   By: Keith Rake M.D.   On: 04/29/2019 19:18    Micro Results     No results found for this or any previous visit (from the past 240 hour(s)).     Today   Subjective:   Aristotle Stellwagen today has no headache,no chest abdominal pain,no new weakness tingling or numbness, feels much better wants to go home today.   Objective:   Blood pressure (!) 109/59, pulse 78, temperature 97.8 F (36.6 C), temperature source Oral, resp. rate 14, height 5\' 6"  (1.676 m), weight 66.5 kg, SpO2 97 %.   Intake/Output Summary (Last 24 hours) at 05/13/2019 1415 Last data filed at 05/13/2019 0900 Gross per 24 hour  Intake 720 ml  Output 0 ml  Net 720 ml    Exam Awake Alert, Oriented x 3, No new F.N deficits, Normal affect [place].AT,PERRAL Supple Neck,No JVD, No cervical lymphadenopathy appriciated.  Symmetrical Chest wall movement, Good air movement bilaterally, CTAB RRR,No Gallops,Rubs or new Murmurs, No Parasternal Heave +ve B.Sounds, Abd Soft, Non tender, No organomegaly appriciated, No rebound -guarding or rigidity. No Cyanosis, Clubbing or edema, No new Rash or bruise  Data Review   CBC w Diff:  Lab Results  Component Value Date   WBC 6.2 05/13/2019   HGB 9.8 (L) 05/13/2019   HCT 32.0 (L) 05/13/2019   PLT 271 05/13/2019   LYMPHOPCT 9 04/29/2019   MONOPCT 8 04/29/2019   EOSPCT 1 04/29/2019   BASOPCT 0 04/29/2019    CMP:  Lab Results  Component Value Date   NA 133 (L) 05/10/2019   K 4.6 05/11/2019   CL 91 (L) 05/10/2019   CO2 28 05/10/2019   BUN 68 (H) 05/10/2019   CREATININE 10.06 (H) 05/13/2019   PROT 6.6 05/10/2019   ALBUMIN 2.9 (L) 05/10/2019   BILITOT 0.3 05/10/2019   ALKPHOS 193 (H) 05/10/2019   AST 18 05/10/2019   ALT 32 05/10/2019  .   Total Time in preparing  paper work, data evaluation and todays exam - 71 minutes  Epifanio Lesches M.D on 05/13/2019 at 2:15 PM    Note: This dictation was prepared with Dragon dictation along  with smaller phrase technology. Any transcriptional errors that result from this process are unintentional.

## 2019-05-13 NOTE — Care Management Important Message (Signed)
Important Message  Patient Details  Name: Jeremy [name] MRN: [id number] Date of Birth: Mar 25, 1961   Medicare Important Message Given:  Yes     Dannette Barbara 05/13/2019, 2:01 PM

## 2019-05-20 ENCOUNTER — Other Ambulatory Visit: Payer: Self-pay

## 2019-05-20 ENCOUNTER — Emergency Department
Admission: EM | Admit: 2019-05-20 | Discharge: 2019-05-20 | Disposition: A | Discharge status: Home / Self Care | Payer: Medicare Other | Attending: Student | Admitting: Student

## 2019-05-20 DIAGNOSIS — Z79899 Other long term (current) drug therapy: Secondary | ICD-10-CM | POA: Insufficient documentation

## 2019-05-20 DIAGNOSIS — N186 End stage renal disease: Secondary | ICD-10-CM | POA: Diagnosis not present

## 2019-05-20 DIAGNOSIS — R42 Dizziness and giddiness: Secondary | ICD-10-CM | POA: Insufficient documentation

## 2019-05-20 DIAGNOSIS — R51 Headache: Secondary | ICD-10-CM | POA: Diagnosis not present

## 2019-05-20 DIAGNOSIS — F17210 Nicotine dependence, cigarettes, uncomplicated: Secondary | ICD-10-CM | POA: Diagnosis not present

## 2019-05-20 DIAGNOSIS — I132 Hypertensive heart and chronic kidney disease with heart failure and with stage 5 chronic kidney disease, or end stage renal disease: Secondary | ICD-10-CM | POA: Insufficient documentation

## 2019-05-20 DIAGNOSIS — R6889 Other general symptoms and signs: Secondary | ICD-10-CM | POA: Diagnosis not present

## 2019-05-20 DIAGNOSIS — R519 Headache, unspecified: Secondary | ICD-10-CM

## 2019-05-20 DIAGNOSIS — Z992 Dependence on renal dialysis: Secondary | ICD-10-CM | POA: Diagnosis not present

## 2019-05-20 DIAGNOSIS — I509 Heart failure, unspecified: Secondary | ICD-10-CM | POA: Insufficient documentation

## 2019-05-20 DIAGNOSIS — F1210 Cannabis abuse, uncomplicated: Secondary | ICD-10-CM | POA: Insufficient documentation

## 2019-05-20 LAB — CBC WITH DIFFERENTIAL/PLATELET
Abs Immature Granulocytes: 0.02 10*3/uL (ref 0.00–0.07)
Basophils Absolute: 0 10*3/uL (ref 0.0–0.1)
Basophils Relative: 1 %
Eosinophils Absolute: 0.3 10*3/uL (ref 0.0–0.5)
Eosinophils Relative: 6 %
HCT: 28 % — ABNORMAL LOW (ref 39.0–52.0)
Hemoglobin: 9 g/dL — ABNORMAL LOW (ref 13.0–17.0)
Immature Granulocytes: 0 %
Lymphocytes Relative: 9 %
Lymphs Abs: 0.4 10*3/uL — ABNORMAL LOW (ref 0.7–4.0)
MCH: 29.7 pg (ref 26.0–34.0)
MCHC: 32.1 g/dL (ref 30.0–36.0)
MCV: 92.4 fL (ref 80.0–100.0)
Monocytes Absolute: 0.5 10*3/uL (ref 0.1–1.0)
Monocytes Relative: 11 %
Neutro Abs: 3.6 10*3/uL (ref 1.7–7.7)
Neutrophils Relative %: 73 %
Platelets: 303 10*3/uL (ref 150–400)
RBC: 3.03 MIL/uL — ABNORMAL LOW (ref 4.22–5.81)
RDW: 16 % — ABNORMAL HIGH (ref 11.5–15.5)
WBC: 4.9 10*3/uL (ref 4.0–10.5)
nRBC: 0 % (ref 0.0–0.2)

## 2019-05-20 LAB — BASIC METABOLIC PANEL
Anion gap: 13 (ref 5–15)
BUN: 45 mg/dL — ABNORMAL HIGH (ref 6–20)
CO2: 23 mmol/L (ref 22–32)
Calcium: 7.9 mg/dL — ABNORMAL LOW (ref 8.9–10.3)
Chloride: 99 mmol/L (ref 98–111)
Creatinine, Ser: 6.54 mg/dL — ABNORMAL HIGH (ref 0.61–1.24)
GFR calc Af Amer: 10 mL/min — ABNORMAL LOW (ref >60)
GFR calc non Af Amer: 9 mL/min — ABNORMAL LOW (ref >60)
Glucose, Bld: 99 mg/dL (ref 70–99)
Potassium: 3.9 mmol/L (ref 3.5–5.1)
Sodium: 135 mmol/L (ref 135–145)

## 2019-05-20 MED ORDER — SODIUM CHLORIDE 0.9 % IV BOLUS
250.0000 mL | Freq: Once | INTRAVENOUS | Status: AC
  Administered 2019-05-20: 250 mL via INTRAVENOUS

## 2019-05-20 MED ORDER — BUTALBITAL-APAP-CAFFEINE 50-325-40 MG PO TABS: 1.0000 | ORAL_TABLET | Freq: Four times a day (QID) | ORAL | 0 refills | Status: DC | PRN

## 2019-05-20 MED ORDER — BUTALBITAL-APAP-CAFFEINE 50-325-40 MG PO TABS
1.0000 | ORAL_TABLET | Freq: Once | ORAL | Status: AC
  Administered 2019-05-20: 1 via ORAL
  Filled 2019-05-20: qty 1

## 2019-05-20 MED ORDER — ACETAMINOPHEN 500 MG PO TABS
1000.0000 mg | ORAL_TABLET | Freq: Once | ORAL | Status: AC
  Administered 2019-05-20: 13:00:00 1000 mg via ORAL
  Filled 2019-05-20: qty 2

## 2019-05-20 NOTE — ED Provider Notes (Signed)
[facility] Emergency Department Provider Note  ____________________________________________   First MD Initiated Contact with Patient 05/20/19 1236     (approximate)  I have reviewed the triage vital signs and the nursing notes.  History  Chief Complaint Dizziness    HPI Jeremy [name] is a 58 y.o. male history of ESRD, on HD, who presents from dialysis complaining that his head feels heavy.  Patient states he completed his full dialysis session today.  After completion of his session he felt like his head felt heavy and that he "couldn't hold it up".  Per EMS, dialysis did take off slightly more than normal for him, which he seemed to be tolerating well throughout the session, until completion.  They did give him about 300 cc normal saline.  He denies any associated visual changes, weakness, numbness, tingling.  No chest pain or shortness of breath.  No nausea, vomiting, diarrhea.  No associated syncope or loss of consciousness.         Past Medical Hx Past Medical History:  Diagnosis Date  . Bronchitis   . Hypertension   . Renal disorder    kidney failure, dialysis    Problem List Patient Active Problem List   Diagnosis Date Noted  . Atrial fibrillation with rapid ventricular response ([place]) 04/30/2019  . Syncope 04/29/2019  . Acute CHF (congestive heart failure) ([place]) 02/14/2019  . Acute encephalopathy 11/23/2018  . Influenza B 11/21/2018  . Pulmonary edema 09/22/2018  . Hyperkalemia 05/16/2018    Past Surgical Hx Past Surgical History:  Procedure Laterality Date  . DIALYSIS FISTULA CREATION      Medications Prior to Admission medications   Medication Sig Start Date End Date Taking? Authorizing Provider  albuterol (PROVENTIL HFA;VENTOLIN HFA) 108 (90 Base) MCG/ACT inhaler Inhale 2 puffs into the lungs every 6 (six) hours as needed for wheezing or shortness of breath. 11/19/18   Nena Polio, MD  amiodarone (PACERONE) 200 MG tablet Take 1  tablet (200 mg total) by mouth 2 (two) times daily. 05/13/19   Epifanio Lesches, MD  benzonatate (TESSALON) 200 MG capsule Take 1 capsule (200 mg total) by mouth 3 (three) times daily. 04/04/19   Dustin Flock, MD  Budesonide 90 MCG/ACT inhaler Inhale 1 puff into the lungs 2 (two) times daily. 04/23/19 04/22/20  [provider]  calcium acetate (PHOSLO) 667 MG capsule Take 667 mg by mouth 3 (three) times daily with meals.     [provider]  cinacalcet (SENSIPAR) 30 MG tablet Take 30 mg by mouth 3 (three) times a week. 09/04/15   [provider]  fluticasone (FLONASE) 50 MCG/ACT nasal spray Place 2 sprays into both nostrils daily. 04/05/19   Dustin Flock, MD  gabapentin (NEURONTIN) 100 MG capsule Take 1 capsule (100 mg total) by mouth daily as needed (pain). Patient taking differently: Take 300 mg by mouth at bedtime.  11/22/18   Bettey Costa, MD  guaiFENesin-codeine 100-10 MG/5ML syrup Take 10 mLs by mouth every 4 (four) hours as needed for cough. 04/04/19   Dustin Flock, MD  metoCLOPramide (REGLAN) 5 MG tablet Take 1 tablet (5 mg total) by mouth 3 (three) times daily before meals for 7 days. 05/13/19 05/20/19  Epifanio Lesches, MD  Multiple Vitamin (MULTIVITAMIN WITH MINERALS) TABS tablet Take 1 tablet by mouth daily.    [provider]  nicotine (NICODERM CQ - DOSED IN MG/24 HOURS) 21 mg/24hr patch Place 1 patch onto the skin daily. 04/23/19 07/26/19  [provider]  nicotine polacrilex (NICORETTE) 4 MG gum Take 4 mg by mouth as directed. 04/23/19 07/28/19  [provider]  Nutritional Supplements (FEEDING SUPPLEMENT, NEPRO CARB STEADY,) LIQD Take 237 mLs by mouth 2 (two) times daily between meals for 15 days. 05/13/19 05/28/19  Epifanio Lesches, MD  oxyCODONE (OXY IR/ROXICODONE) 5 MG immediate release tablet Take 1 tablet (5 mg total) by mouth every 6 (six) hours as needed for moderate pain. 05/13/19   Epifanio Lesches, MD  pantoprazole  (PROTONIX) 40 MG tablet Take 1 tablet (40 mg total) by mouth daily. 04/05/19   Dustin Flock, MD  sodium zirconium cyclosilicate (LOKELMA) 10 g PACK packet Take 10 g by mouth daily. 05/13/19   Epifanio Lesches, MD  umeclidinium-vilanterol (ANORO ELLIPTA) 62.5-25 MCG/INH AEPB Inhale 1 puff into the lungs daily. 02/02/19   [provider]    Allergies Patient has no known allergies.  Family Hx Family History  Problem Relation Age of Onset  . Hypertension Mother     Social Hx Social History   Tobacco Use  . Smoking status: Current Every Day Smoker    Packs/day: 1.00    Types: Cigarettes  . Smokeless tobacco: Never Used  Substance Use Topics  . Alcohol use: Never    Frequency: Never  . Drug use: Yes    Types: Marijuana     Review of Systems  Constitutional: Negative for fever. Negative for chills. Eyes: Negative for visual changes. ENT: Negative for sore throat. Cardiovascular: Negative for chest pain. Respiratory: Negative for shortness of breath. Gastrointestinal: Negative for abdominal pain. Negative for nausea. Negative for vomiting. Genitourinary: Negative for dysuria. Musculoskeletal: Negative for leg swelling. Skin: Negative for rash. Neurological: Negative for for headaches. + "head heaviness"   Physical Exam  Vital Signs: ED Triage Vitals  Enc Vitals Group     BP 05/20/19 1238 (!) 151/76     Pulse Rate 05/20/19 1238 71     Resp 05/20/19 1238 14     Temp 05/20/19 1238 97.6 F (36.4 C)     Temp Source 05/20/19 1238 Oral     SpO2 05/20/19 1238 97 %     Weight 05/20/19 1235 138 lb 14.2 oz (63 kg)     Height 05/20/19 1235 5\' 6"  (1.676 m)     Head Circumference --      Peak Flow --      Pain Score 05/20/19 1235 0     Pain Loc --      Pain Edu? --      Excl. in [place]? --     Constitutional: Alert and oriented.  Eyes: Conjunctivae clear. Sclera anicteric. Head: Normocephalic. Atraumatic. Nose: No congestion. No rhinorrhea. Mouth/Throat:  Mucous membranes are moist.  Neck: No stridor.   Cardiovascular: Normal rate, regular rhythm. No murmurs. Extremities well perfused. Respiratory: Normal respiratory effort.  Lungs CTAB. Gastrointestinal: Soft and non-tender. No distention.  Musculoskeletal: No lower extremity edema. Neurologic:  Normal speech and language. No gross focal neurologic deficits are appreciated. Alert and oriented.  Face symmetric. Cranial nerves II through XII intact. UE and LE strength 5/5 and symmetric. UE and LE SILT.  Skin: Skin is warm, dry and intact. No rash noted. Psychiatric: Mood and affect are appropriate for situation.  EKG  Personally reviewed.   Rate: 70 Rhythm: sinus Axis: normal Intervals: QTc 505 ms, QRS 106 ms LVH Prolonged QTc No acute ischemic changes No STEMI    Radiology  N/A   Procedures  Procedure(s) performed (including critical  care):  Procedures   Initial Impression / Assessment and Plan / ED Course  58 y.o. male who presents to the ED for having a "heavy head" sensation after finishing dialysis.   Ddx: tension headache, symptomatic from fluid shifts, mild dehydration (per report, dialysis took off slightly more than usual). No other associated neurological symptoms suggestive of CVA.  Patient has had a recent extensive work-up for syncope (which she denies today), including an MRI brain on 9/1, with no acute intracranial abnormality and CT head on 8/25 without acute abnormality either.  Given this recent imaging, do not feel repeat acute head imaging is indicated at this time.  Plan: basic labs, small fluids, PO, reassess  Patient reports feeling improved after fluids and symptomatic control.  Electrolytes without actionable derangements on lab work.  Anemia baseline.  We will plan for discharge, advised supportive care, short course Rx for Fioricet as needed, PCP follow-up, given return precautions.  Patient is agreeable with the plan and discharge.   Final  Clinical Impression(s) / ED Diagnosis  Final diagnoses:  Sensation of heaviness  Complaint of headache       Note:  This document was prepared using Dragon voice recognition software and may include unintentional dictation errors.   Lilia Pro., MD 05/20/19 [phone]

## 2019-05-20 NOTE — Discharge Instructions (Signed)
Thank you for letting us take care of you in the emergency department today.   Please continue to take any regular, prescribed medications.   New medications we have prescribed:  - Fioricet - use as needed for headache  Please follow up with: - Your primary care doctor to review your ER visit and follow up on your symptoms.   Please return to the ER for any new or worsening symptoms.

## 2019-05-20 NOTE — ED Notes (Signed)
Assumed care of patient . AOX4 denies pain or discomforts at present time. Awaiting Md to discuss further plan of care or disposition. Vss. Safety maintained. Will monitor.

## 2019-05-20 NOTE — ED Notes (Signed)
CBG with EMS 112

## 2019-05-20 NOTE — ED Notes (Signed)
Pt provided apple juice and saltine crackers, ok per EDP.

## 2019-05-20 NOTE — ED Notes (Signed)
Awaiting disposition.

## 2019-05-20 NOTE — ED Notes (Signed)
Pt signed esignature.  D/c inst to pt.  Iv dc'ed.  Pt alert.

## 2019-05-20 NOTE — ED Triage Notes (Addendum)
Pt arrives [facility] from dialysis. Received full treatment. When finished he started to have "heavy" feeling in head like he couldn't hold his head up. Received 333ml saline back.   EDP at bedside. A&O, speaking in complete sentences. Appears weak and tired.   Pt states he traveled to [place] and came back 2 days ago.   Pt states chronic cough x 4 months. Has had negative COVID tests.

## 2019-05-20 NOTE — ED Notes (Signed)
Pt provided saltine crackers. States he feels well but "my head still feels heavy." does not appear as weak and tired at this time, states he doesn't feel weak or tired any longer.

## 2019-06-15 ENCOUNTER — Other Ambulatory Visit (INDEPENDENT_AMBULATORY_CARE_PROVIDER_SITE_OTHER): Payer: Self-pay | Admitting: Internal Medicine

## 2019-06-15 DIAGNOSIS — M79602 Pain in left arm: Secondary | ICD-10-CM

## 2019-06-16 ENCOUNTER — Encounter (INDEPENDENT_AMBULATORY_CARE_PROVIDER_SITE_OTHER): Payer: Self-pay

## 2019-06-21 ENCOUNTER — Ambulatory Visit (INDEPENDENT_AMBULATORY_CARE_PROVIDER_SITE_OTHER): Payer: Medicare Other

## 2019-06-21 ENCOUNTER — Emergency Department
Admission: EM | Admit: 2019-06-21 | Discharge: 2019-06-21 | Disposition: A | Discharge status: Home / Self Care | Payer: Medicare Other | Attending: Emergency Medicine | Admitting: Emergency Medicine

## 2019-06-21 ENCOUNTER — Encounter (INDEPENDENT_AMBULATORY_CARE_PROVIDER_SITE_OTHER): Payer: Self-pay

## 2019-06-21 ENCOUNTER — Other Ambulatory Visit: Payer: Self-pay

## 2019-06-21 ENCOUNTER — Encounter: Payer: Self-pay | Admitting: Intensive Care

## 2019-06-21 DIAGNOSIS — F17210 Nicotine dependence, cigarettes, uncomplicated: Secondary | ICD-10-CM | POA: Diagnosis not present

## 2019-06-21 DIAGNOSIS — Z79899 Other long term (current) drug therapy: Secondary | ICD-10-CM | POA: Insufficient documentation

## 2019-06-21 DIAGNOSIS — M4802 Spinal stenosis, cervical region: Secondary | ICD-10-CM | POA: Diagnosis not present

## 2019-06-21 DIAGNOSIS — M79602 Pain in left arm: Secondary | ICD-10-CM | POA: Diagnosis not present

## 2019-06-21 DIAGNOSIS — I10 Essential (primary) hypertension: Secondary | ICD-10-CM | POA: Insufficient documentation

## 2019-06-21 DIAGNOSIS — R202 Paresthesia of skin: Secondary | ICD-10-CM | POA: Diagnosis present

## 2019-06-21 MED ORDER — OXYCODONE HCL 5 MG PO TABS: 5.0000 mg | ORAL_TABLET | Freq: Four times a day (QID) | ORAL | 0 refills | Status: DC | PRN

## 2019-06-21 MED ORDER — DEXAMETHASONE SODIUM PHOSPHATE 10 MG/ML IJ SOLN
8.0000 mg | Freq: Once | INTRAMUSCULAR | Status: AC
  Administered 2019-06-21: 16:00:00 8 mg via INTRAMUSCULAR
  Filled 2019-06-21: qty 1

## 2019-06-21 NOTE — ED Triage Notes (Addendum)
Patient c/o left arm tingling/pain/numbness that is also radiating to the right arm recently. Current dialysis patient. reiceves treatment M,W,F. Reports his left arm is the one causing problems. Reports he had full treatment yesterday

## 2019-06-21 NOTE — ED Provider Notes (Signed)
[facility] Emergency Department Provider Note   ____________________________________________    I have reviewed the triage vital signs and the nursing notes.   HISTORY  Chief Complaint Numbness     HPI Jeremy [name] is a 58 y.o. male who presents with complaints of intermittent tingling and discomfort in the left arm for over a month now.  He reports he has occasionally had this in the right arm as well.  He request something for helping him sleep.  Review of medical records demonstrates the patient had an MRI of the cervical spine in September which demonstrated some mild to moderate cervical stenosis.  Patient denies weakness, symptoms are intermittent.  No fevers or chills.   Past Medical History:  Diagnosis Date  . Bronchitis   . Hypertension   . Renal disorder    kidney failure, dialysis    Patient Active Problem List   Diagnosis Date Noted  . Atrial fibrillation with rapid ventricular response ([place]) 04/30/2019  . Syncope 04/29/2019  . Acute CHF (congestive heart failure) ([place]) 02/14/2019  . Acute encephalopathy 11/23/2018  . Influenza B 11/21/2018  . Pulmonary edema 09/22/2018  . Hyperkalemia 05/16/2018    Past Surgical History:  Procedure Laterality Date  . DIALYSIS FISTULA CREATION      Prior to Admission medications   Medication Sig Start Date End Date Taking? Authorizing Provider  albuterol (PROVENTIL HFA;VENTOLIN HFA) 108 (90 Base) MCG/ACT inhaler Inhale 2 puffs into the lungs every 6 (six) hours as needed for wheezing or shortness of breath. 11/19/18   Nena Polio, MD  amiodarone (PACERONE) 200 MG tablet Take 1 tablet (200 mg total) by mouth 2 (two) times daily. 05/13/19   Epifanio Lesches, MD  benzonatate (TESSALON) 200 MG capsule Take 1 capsule (200 mg total) by mouth 3 (three) times daily. 04/04/19   Dustin Flock, MD  Budesonide 90 MCG/ACT inhaler Inhale 1 puff into the lungs 2 (two) times daily. 04/23/19 04/22/20   [provider]  butalbital-acetaminophen-caffeine (FIORICET) 50-325-40 MG tablet Take 1 tablet by mouth every 6 (six) hours as needed for headache. 05/20/19 05/19/20  Lilia Pro., MD  calcium acetate (PHOSLO) 667 MG capsule Take 667 mg by mouth 3 (three) times daily with meals.     [provider]  cinacalcet (SENSIPAR) 30 MG tablet Take 30 mg by mouth 3 (three) times a week. 09/04/15   [provider]  fluticasone (FLONASE) 50 MCG/ACT nasal spray Place 2 sprays into both nostrils daily. 04/05/19   Dustin Flock, MD  gabapentin (NEURONTIN) 100 MG capsule Take 1 capsule (100 mg total) by mouth daily as needed (pain). Patient taking differently: Take 300 mg by mouth at bedtime.  11/22/18   Bettey Costa, MD  guaiFENesin-codeine 100-10 MG/5ML syrup Take 10 mLs by mouth every 4 (four) hours as needed for cough. 04/04/19   Dustin Flock, MD  metoCLOPramide (REGLAN) 5 MG tablet Take 1 tablet (5 mg total) by mouth 3 (three) times daily before meals for 7 days. 05/13/19 05/20/19  Epifanio Lesches, MD  Multiple Vitamin (MULTIVITAMIN WITH MINERALS) TABS tablet Take 1 tablet by mouth daily.    [provider]  nicotine (NICODERM CQ - DOSED IN MG/24 HOURS) 21 mg/24hr patch Place 1 patch onto the skin daily. 04/23/19 07/26/19  [provider]  nicotine polacrilex (NICORETTE) 4 MG gum Take 4 mg by mouth as directed. 04/23/19 07/28/19  [provider]  oxyCODONE (OXY IR/ROXICODONE) 5 MG immediate release tablet Take 1  tablet (5 mg total) by mouth every 6 (six) hours as needed for severe pain. 06/21/19   Lavonia Drafts, MD  pantoprazole (PROTONIX) 40 MG tablet Take 1 tablet (40 mg total) by mouth daily. 04/05/19   Dustin Flock, MD  sodium zirconium cyclosilicate (LOKELMA) 10 g PACK packet Take 10 g by mouth daily. 05/13/19   Epifanio Lesches, MD  umeclidinium-vilanterol (ANORO ELLIPTA) 62.5-25 MCG/INH AEPB Inhale 1 puff into the lungs daily. 02/02/19    [provider]     Allergies Patient has no known allergies.  Family History  Problem Relation Age of Onset  . Hypertension Mother     Social History Social History   Tobacco Use  . Smoking status: Current Every Day Smoker    Packs/day: 1.00    Types: Cigarettes  . Smokeless tobacco: Never Used  Substance Use Topics  . Alcohol use: Yes    Frequency: Never    Comment: rare  . Drug use: Yes    Types: Marijuana    Review of Systems  Constitutional: No fever/chills Eyes: No visual changes.  ENT: No neck pain Cardiovascular: Denies chest pain. Respiratory: Denies shortness of breath. Gastrointestinal: No abdominal pain.  No nausea, no vomiting.   Genitourinary: Negative for dysuria. Musculoskeletal: As above, no neck pain Skin: Negative for rash. Neurological: Negative for headaches   ____________________________________________   PHYSICAL EXAM:  VITAL SIGNS: ED Triage Vitals  Enc Vitals Group     BP 06/21/19 1522 (!) 149/69     Pulse Rate 06/21/19 1522 85     Resp 06/21/19 1522 16     Temp 06/21/19 1522 99.3 F (37.4 C)     Temp Source 06/21/19 1522 Oral     SpO2 06/21/19 1522 99 %     Weight 06/21/19 1515 67.6 kg (149 lb)     Height 06/21/19 1515 1.676 m (5\' 6" )     Head Circumference --      Peak Flow --      Pain Score 06/21/19 1515 10     Pain Loc --      Pain Edu? --      Excl. in [place]? --     Constitutional: Alert and oriented.  Head: Atraumatic.  Mouth/Throat: Mucous membranes are moist.   Neck:  Painless ROM, no cervical vertebral tenderness to palpation Cardiovascular: Normal rate, regular rhythm. Grossly normal heart sounds.  Good peripheral circulation. Respiratory: Normal respiratory effort.  No retractions. Lungs CTAB. Gastrointestinal: Soft and nontender. No distention.  No CVA tenderness. Genitourinary: deferred Musculoskeletal: Warm and well perfused Neurologic:  Normal speech and language. No gross focal neurologic  deficits are appreciated.  Normal strength in all extremities Skin:  Skin is warm, dry and intact. No rash noted. Psychiatric: Mood and affect are normal. Speech and behavior are normal.  ____________________________________________   LABS (all labs ordered are listed, but only abnormal results are displayed)  Labs Reviewed - No data to display ____________________________________________  EKG   ____________________________________________  RADIOLOGY  Reviewed MRI from September ____________________________________________   PROCEDURES  Procedure(s) performed: No  Procedures   Critical Care performed: No ____________________________________________   INITIAL IMPRESSION / ASSESSMENT AND PLAN / ED COURSE  Pertinent labs & imaging results that were available during my care of the patient were reviewed by me and considered in my medical decision making (see chart for details).  Patient with intermittent radiculopathy and both arms, given his MRI suspect cervical radiculopathy, will give injection of steroids for symptom relief.  Discussed with him that he needs to follow-up with spine surgery closely.  Patient agrees with this plan    ____________________________________________   FINAL CLINICAL IMPRESSION(S) / ED DIAGNOSES  Final diagnoses:  Cervical stenosis of spine        Note:  This document was prepared using Dragon voice recognition software and may include unintentional dictation errors.   Lavonia Drafts, MD 06/21/19 [phone]

## 2019-06-21 NOTE — ED Notes (Signed)
Pt alert and oriented X 4, stable for discharge. RR even and unlabored, color WNL. Discussed discharge instructions and follow up when appropriate. Instructed to follow up with ER for any life threatening symptoms or concerns that patient or family of patient may have  

## 2019-06-26 ENCOUNTER — Emergency Department
Admission: EM | Admit: 2019-06-26 | Discharge: 2019-06-26 | Disposition: A | Discharge status: Home / Self Care | Payer: Medicare Other | Attending: Student | Admitting: Student

## 2019-06-26 ENCOUNTER — Other Ambulatory Visit: Payer: Self-pay

## 2019-06-26 DIAGNOSIS — F17210 Nicotine dependence, cigarettes, uncomplicated: Secondary | ICD-10-CM | POA: Insufficient documentation

## 2019-06-26 DIAGNOSIS — F1210 Cannabis abuse, uncomplicated: Secondary | ICD-10-CM | POA: Diagnosis not present

## 2019-06-26 DIAGNOSIS — M5412 Radiculopathy, cervical region: Secondary | ICD-10-CM | POA: Diagnosis not present

## 2019-06-26 DIAGNOSIS — Z79899 Other long term (current) drug therapy: Secondary | ICD-10-CM | POA: Insufficient documentation

## 2019-06-26 DIAGNOSIS — I110 Hypertensive heart disease with heart failure: Secondary | ICD-10-CM | POA: Insufficient documentation

## 2019-06-26 DIAGNOSIS — M79622 Pain in left upper arm: Secondary | ICD-10-CM | POA: Diagnosis present

## 2019-06-26 DIAGNOSIS — I509 Heart failure, unspecified: Secondary | ICD-10-CM | POA: Insufficient documentation

## 2019-06-26 MED ORDER — KETOROLAC TROMETHAMINE 10 MG PO TABS: 10.0000 mg | ORAL_TABLET | Freq: Three times a day (TID) | ORAL | 0 refills | Status: DC

## 2019-06-26 MED ORDER — TRAMADOL HCL 50 MG PO TABS: 50.0000 mg | ORAL_TABLET | Freq: Three times a day (TID) | ORAL | 0 refills | Status: AC | PRN

## 2019-06-26 MED ORDER — CYCLOBENZAPRINE HCL 5 MG PO TABS: 5.0000 mg | ORAL_TABLET | Freq: Three times a day (TID) | ORAL | 0 refills | Status: AC | PRN

## 2019-06-26 MED ORDER — KETOROLAC TROMETHAMINE 30 MG/ML IJ SOLN
30.0000 mg | Freq: Once | INTRAMUSCULAR | Status: AC
  Administered 2019-06-26: 12:00:00 30 mg via INTRAMUSCULAR
  Filled 2019-06-26: qty 1

## 2019-06-26 MED ORDER — PREDNISONE 20 MG PO TABS: 40.0000 mg | ORAL_TABLET | Freq: Every day | ORAL | 0 refills | Status: AC

## 2019-06-26 MED ORDER — METOCLOPRAMIDE HCL 5 MG PO TABS: 5.0000 mg | ORAL_TABLET | Freq: Three times a day (TID) | ORAL | 1 refills | Status: DC | PRN

## 2019-06-26 MED ORDER — GABAPENTIN 300 MG PO CAPS: 300.0000 mg | ORAL_CAPSULE | Freq: Two times a day (BID) | ORAL | 1 refills | Status: DC

## 2019-06-26 NOTE — ED Provider Notes (Signed)
[facility] Emergency Department Provider Note ____________________________________________  Time seen: 1136  I have reviewed the triage vital signs and the nursing notes.  HISTORY  Chief Complaint  Arm Pain  HPI Jeremy [name] is a 58 y.o. male presents to the ED for evaluation of continued left greater than right upper extremity radicular symptoms.  Patient with a confirmed history of significant spinal stenosis and degenerative disc disease on a recent MRI, presents with continued neuropathic symptoms.  Patient reports that previously prescribed oxycodone is no longer helpful.  He denies any other symptoms at this time.  He apparently had an initial evaluation set up with neurosurgery on 10/8, but the [place] the [place] was unable to keep department.  He has been unable to secure a rescheduled appointment at the time of his ED visit.   Past Medical History:  Diagnosis Date  . Bronchitis   . Hypertension   . Renal disorder    kidney failure, dialysis    Patient Active Problem List   Diagnosis Date Noted  . Atrial fibrillation with rapid ventricular response ([place]) 04/30/2019  . Syncope 04/29/2019  . Acute CHF (congestive heart failure) ([place]) 02/14/2019  . Acute encephalopathy 11/23/2018  . Influenza B 11/21/2018  . Pulmonary edema 09/22/2018  . Hyperkalemia 05/16/2018    Past Surgical History:  Procedure Laterality Date  . DIALYSIS FISTULA CREATION      Prior to Admission medications   Medication Sig Start Date End Date Taking? Authorizing Provider  albuterol (PROVENTIL HFA;VENTOLIN HFA) 108 (90 Base) MCG/ACT inhaler Inhale 2 puffs into the lungs every 6 (six) hours as needed for wheezing or shortness of breath. 11/19/18   Nena Polio, MD  amiodarone (PACERONE) 200 MG tablet Take 1 tablet (200 mg total) by mouth 2 (two) times daily. 05/13/19   Epifanio Lesches, MD  Budesonide 90 MCG/ACT inhaler Inhale 1 puff  into the lungs 2 (two) times daily. 04/23/19 04/22/20  [provider]  butalbital-acetaminophen-caffeine (FIORICET) 50-325-40 MG tablet Take 1 tablet by mouth every 6 (six) hours as needed for headache. 05/20/19 05/19/20  Lilia Pro., MD  calcium acetate (PHOSLO) 667 MG capsule Take 667 mg by mouth 3 (three) times daily with meals.     [provider]  cinacalcet (SENSIPAR) 30 MG tablet Take 30 mg by mouth 3 (three) times a week. 09/04/15   [provider]  cyclobenzaprine (FLEXERIL) 5 MG tablet Take 1 tablet (5 mg total) by mouth 3 (three) times daily as needed for up to 10 days for muscle spasms. 06/26/19 07/06/19  Sylvia Helms, Dannielle Karvonen, PA-C  fluticasone (FLONASE) 50 MCG/ACT nasal spray Place 2 sprays into both nostrils daily. 04/05/19   Dustin Flock, MD  gabapentin (NEURONTIN) 300 MG capsule Take 1 capsule (300 mg total) by mouth 2 (two) times daily. 06/26/19 08/25/19  Karla Vines, Dannielle Karvonen, PA-C  metoCLOPramide (REGLAN) 5 MG tablet Take 1 tablet (5 mg total) by mouth every 8 (eight) hours as needed for up to 14 days for nausea. 06/26/19 07/10/19  Rozalyn Osland, Dannielle Karvonen, PA-C  Multiple Vitamin (MULTIVITAMIN WITH MINERALS) TABS tablet Take 1 tablet by mouth daily.    [provider]  nicotine (NICODERM CQ - DOSED IN MG/24 HOURS) 21 mg/24hr patch Place 1 patch onto the skin daily. 04/23/19 07/26/19  [provider]  nicotine polacrilex (NICORETTE) 4 MG gum Take 4 mg by mouth as directed. 04/23/19 07/28/19  [provider]  pantoprazole (PROTONIX) 40 MG tablet Take 1 tablet (40 mg total) by mouth daily. 04/05/19   Dustin Flock, MD  predniSONE (DELTASONE) 20 MG tablet Take 2 tablets (40 mg total) by mouth daily with breakfast for 5 days. 06/26/19 07/01/19  Daegan Arizmendi, Dannielle Karvonen, PA-C  traMADol (ULTRAM) 50 MG tablet Take 1 tablet (50 mg total) by mouth 3 (three) times daily as needed for up to 5 days. 06/26/19 07/01/19  Rital Cavey, Dannielle Karvonen, PA-C    Allergies Patient has no known allergies.  Family History  Problem Relation Age of Onset  . Hypertension Mother     Social History Social History   Tobacco Use  . Smoking status: Current Every Day Smoker    Packs/day: 1.00    Types: Cigarettes  . Smokeless tobacco: Never Used  Substance Use Topics  . Alcohol use: Yes    Frequency: Never    Comment: rare  . Drug use: Yes    Types: Marijuana    Review of Systems  Constitutional: Negative for fever. Eyes: Negative for visual changes. ENT: Negative for sore throat. Cardiovascular: Negative for chest pain. Respiratory: Negative for shortness of breath. Gastrointestinal: Negative for abdominal pain, vomiting and diarrhea. Genitourinary: Negative for dysuria. Musculoskeletal: Negative for back pain. Skin: Negative for rash. Neurological: Negative for headaches, focal weakness. Reports LUE tingling & numbness as above ____________________________________________  PHYSICAL EXAM:  VITAL SIGNS: ED Triage Vitals  Enc Vitals Group     BP 06/26/19 1114 (!) 187/73     Pulse Rate 06/26/19 1114 78     Resp 06/26/19 1114 16     Temp 06/26/19 1114 98 F (36.7 C)     Temp Source 06/26/19 1114 Oral     SpO2 06/26/19 1114 93 %     Weight 06/26/19 1112 149 lb (67.6 kg)     Height 06/26/19 1112 5\' 6"  (1.676 m)     Head Circumference --      Peak Flow --      Pain Score 06/26/19 1112 10     Pain Loc --      Pain Edu? --      Excl. in [place]? --     Constitutional: Alert and oriented. Well appearing and in no distress. Head: Normocephalic and atraumatic. Eyes: Conjunctivae are normal. PERRL. Normal extraocular movements Neck: Supple. Normal ROM Cardiovascular: Normal rate, regular rhythm. Normal distal pulses. Respiratory: Normal respiratory effort. No wheezes/rales/rhonchi. Musculoskeletal: Nontender with normal range of motion in all extremities.  Neurologic: Cranial nerves II through XII grossly intact.   Normal intrinsic and opposition testing.  Normal gait without ataxia. Normal speech and language. No gross focal neurologic deficits are appreciated. Skin:  Skin is warm, dry and intact. No rash noted. Psychiatric: Mood and affect are normal. Patient exhibits appropriate insight and judgment. ____________________________________________  PROCEDURES  Toradol 30 mg IM Procedures ____________________________________________  INITIAL IMPRESSION / ASSESSMENT AND PLAN / ED COURSE  Patient with ED evaluation of continued left upper extremity radicular symptoms following confirmation of underlying cervical stenosis and DDD.  Patient clinical picture is consistent with cervical radiculopathy, and patient is not under the care of neurosurgery at this time.  He is encouraged to follow-up with neurosurgery and establish initial care as previously directed.  Prescriptions for gabapentin are provided with an attempt to increase his dosing to twice daily and ultimately 3 times daily for neuropathic pain.  He is also given a prescription for prednisone, Flexeril, Reglan, and Ultram.  He will  follow-up as directed or return to the ED as needed.  Jeremy [name] was evaluated in Emergency Department on 06/26/2019 for the symptoms described in the history of present illness. He was evaluated in the context of the global COVID-19 pandemic, which necessitated consideration that the patient might be at risk for infection with the SARS-CoV-2 virus that causes COVID-19. Institutional protocols and algorithms that pertain to the evaluation of patients at risk for COVID-19 are in a state of rapid change based on information released by regulatory bodies including the CDC and federal and state organizations. These policies and algorithms were followed during the patient's care in the ED.  I reviewed the patient's prescription history over the last 12 months in the multi-state controlled substances database(s) that includes [place],  [place], [place], [place], [place], [employer], [place], [place], [place], [place], [place], [place], [place], and [place].  Results were notable for recent RX noted.  ____________________________________________  FINAL CLINICAL IMPRESSION(S) / ED DIAGNOSES  Final diagnoses:  Left cervical radiculopathy      Carmie End, Dannielle Karvonen, PA-C 06/26/19 1213    Lilia Pro., MD 06/27/19 1224

## 2019-06-26 NOTE — ED Notes (Signed)
Pt states the medication (oxycontin) that he was given last time did not help- states he [place] waiting on a call back from the spinal surgeon- states he cannot take the pain until then- has also tried OTC with no relief

## 2019-06-26 NOTE — Discharge Instructions (Signed)
Your exam is consistent with numbness & tingling from your pinched spinal nerves. Take the prescription meds as directed. You should follow-up with your Neurology provider as soon as possible. Return as needed.

## 2019-06-26 NOTE — ED Triage Notes (Signed)
Pt c/o tingling pain down the left arm from over a month and was seen here this past week and given a refer rel but states he got his answering service.

## 2019-07-02 ENCOUNTER — Other Ambulatory Visit: Payer: Self-pay

## 2019-07-02 ENCOUNTER — Emergency Department
Admission: EM | Admit: 2019-07-02 | Discharge: 2019-07-02 | Disposition: A | Discharge status: Home / Self Care | Payer: Medicare Other | Attending: Emergency Medicine | Admitting: Emergency Medicine

## 2019-07-02 ENCOUNTER — Emergency Department: Payer: Medicare Other

## 2019-07-02 ENCOUNTER — Encounter: Payer: Self-pay | Admitting: Emergency Medicine

## 2019-07-02 DIAGNOSIS — R609 Edema, unspecified: Secondary | ICD-10-CM | POA: Diagnosis present

## 2019-07-02 DIAGNOSIS — I132 Hypertensive heart and chronic kidney disease with heart failure and with stage 5 chronic kidney disease, or end stage renal disease: Secondary | ICD-10-CM | POA: Diagnosis not present

## 2019-07-02 DIAGNOSIS — F17210 Nicotine dependence, cigarettes, uncomplicated: Secondary | ICD-10-CM | POA: Insufficient documentation

## 2019-07-02 DIAGNOSIS — I509 Heart failure, unspecified: Secondary | ICD-10-CM | POA: Insufficient documentation

## 2019-07-02 DIAGNOSIS — Z992 Dependence on renal dialysis: Secondary | ICD-10-CM | POA: Insufficient documentation

## 2019-07-02 DIAGNOSIS — N186 End stage renal disease: Secondary | ICD-10-CM | POA: Diagnosis not present

## 2019-07-02 DIAGNOSIS — M25512 Pain in left shoulder: Secondary | ICD-10-CM

## 2019-07-02 LAB — CBC WITH DIFFERENTIAL/PLATELET
Abs Immature Granulocytes: 0.05 10*3/uL (ref 0.00–0.07)
Basophils Absolute: 0 10*3/uL (ref 0.0–0.1)
Basophils Relative: 0 %
Eosinophils Absolute: 0.1 10*3/uL (ref 0.0–0.5)
Eosinophils Relative: 1 %
HCT: 27.9 % — ABNORMAL LOW (ref 39.0–52.0)
Hemoglobin: 8.7 g/dL — ABNORMAL LOW (ref 13.0–17.0)
Immature Granulocytes: 1 %
Lymphocytes Relative: 15 %
Lymphs Abs: 1 10*3/uL (ref 0.7–4.0)
MCH: 31 pg (ref 26.0–34.0)
MCHC: 31.2 g/dL (ref 30.0–36.0)
MCV: 99.3 fL (ref 80.0–100.0)
Monocytes Absolute: 0.7 10*3/uL (ref 0.1–1.0)
Monocytes Relative: 10 %
Neutro Abs: 4.9 10*3/uL (ref 1.7–7.7)
Neutrophils Relative %: 73 %
Platelets: 205 10*3/uL (ref 150–400)
RBC: 2.81 MIL/uL — ABNORMAL LOW (ref 4.22–5.81)
RDW: 17.4 % — ABNORMAL HIGH (ref 11.5–15.5)
WBC: 6.8 10*3/uL (ref 4.0–10.5)
nRBC: 0 % (ref 0.0–0.2)

## 2019-07-02 LAB — COMPREHENSIVE METABOLIC PANEL
ALT: 13 U/L (ref 0–44)
AST: 14 U/L — ABNORMAL LOW (ref 15–41)
Albumin: 3.4 g/dL — ABNORMAL LOW (ref 3.5–5.0)
Alkaline Phosphatase: 153 U/L — ABNORMAL HIGH (ref 38–126)
Anion gap: 15 (ref 5–15)
BUN: 44 mg/dL — ABNORMAL HIGH (ref 6–20)
CO2: 24 mmol/L (ref 22–32)
Calcium: 8.7 mg/dL — ABNORMAL LOW (ref 8.9–10.3)
Chloride: 99 mmol/L (ref 98–111)
Creatinine, Ser: 7.89 mg/dL — ABNORMAL HIGH (ref 0.61–1.24)
GFR calc Af Amer: 8 mL/min — ABNORMAL LOW (ref >60)
GFR calc non Af Amer: 7 mL/min — ABNORMAL LOW (ref >60)
Glucose, Bld: 160 mg/dL — ABNORMAL HIGH (ref 70–99)
Potassium: 4.2 mmol/L (ref 3.5–5.1)
Sodium: 138 mmol/L (ref 135–145)
Total Bilirubin: 0.5 mg/dL (ref 0.3–1.2)
Total Protein: 6.7 g/dL (ref 6.5–8.1)

## 2019-07-02 MED ORDER — OXYCODONE-ACETAMINOPHEN 5-325 MG PO TABS: 1.0000 | ORAL_TABLET | Freq: Two times a day (BID) | ORAL | 0 refills | Status: DC | PRN

## 2019-07-02 MED ORDER — HYDROMORPHONE HCL 1 MG/ML IJ SOLN
1.0000 mg | Freq: Once | INTRAMUSCULAR | Status: AC
  Administered 2019-07-02: 1 mg via INTRAMUSCULAR
  Filled 2019-07-02: qty 1

## 2019-07-02 NOTE — ED Notes (Signed)
First Nurse Note: Pt to ED stating that he needs dialysis. Pt states that he was at dialysis but the doctor told him he was only going to give him 1 kilo per hour and he told them to unplug him and he would come to the hospital. Pt states that he thinks his doctor is "trying to kill him". Pt believes his creatine is up more than normal. Pt states that he weighed 71 kg today at dialysis and his dry weight is 64 kg.

## 2019-07-02 NOTE — ED Provider Notes (Signed)
[facility] Emergency Department Provider Note       Time seen: ----------------------------------------- 3:07 PM on 07/02/2019 -----------------------------------------   I have reviewed the triage vital signs and the nursing notes.  HISTORY   Chief Complaint fluid retention   HPI Jeremy [name] is a 58 y.o. male with a history of bronchitis, hypertension, renal disorder, atrial fibrillation, CHF, encephalopathy who presents to the ED for possible fluid retention.  Patient reports he is a dialysis patient, normally goes on Monday, Wednesday and Friday.  Dialysis went normal yesterday, he feels like he is well above his dry weight because of prednisone treatment he is started for left shoulder pain.  Reportedly he has cervical radiculopathy and is scheduled to see neurosurgery.  He is also requesting pain medicine for his left shoulder.  Past Medical History:  Diagnosis Date  . Bronchitis   . Hypertension   . Renal disorder    kidney failure, dialysis    Patient Active Problem List   Diagnosis Date Noted  . Atrial fibrillation with rapid ventricular response ([place]) 04/30/2019  . Syncope 04/29/2019  . Acute CHF (congestive heart failure) ([place]) 02/14/2019  . Acute encephalopathy 11/23/2018  . Influenza B 11/21/2018  . Pulmonary edema 09/22/2018  . Hyperkalemia 05/16/2018    Past Surgical History:  Procedure Laterality Date  . DIALYSIS FISTULA CREATION      Allergies Patient has no known allergies.  Social History Social History   Tobacco Use  . Smoking status: Current Every Day Smoker    Packs/day: 1.00    Types: Cigarettes  . Smokeless tobacco: Never Used  Substance Use Topics  . Alcohol use: Yes    Frequency: Never    Comment: rare  . Drug use: Yes    Types: Marijuana    Review of Systems Constitutional: Negative for fever. Cardiovascular: Negative for chest pain. Respiratory: Negative for shortness of breath. Gastrointestinal:  Negative for abdominal pain, vomiting and diarrhea. Musculoskeletal: Positive for left shoulder pain Skin: Negative for rash. Neurological: Negative for headaches, focal weakness or numbness.  All systems negative/normal/unremarkable except as stated in the HPI  ____________________________________________   PHYSICAL EXAM:  VITAL SIGNS: ED Triage Vitals [07/02/19 1405]  Enc Vitals Group     BP (!) 144/65     Pulse Rate 93     Resp 20     Temp 98.9 F (37.2 C)     Temp Source Oral     SpO2 94 %     Weight 156 lb 8.4 oz (71 kg)     Height 5\' 6"  (1.676 m)     Head Circumference      Peak Flow      Pain Score 9     Pain Loc      Pain Edu?      Excl. in [place]?     Constitutional: Alert and oriented.  No acute distress Eyes: Conjunctivae are normal. Normal extraocular movements. ENT      Head: Normocephalic and atraumatic.      Nose: No congestion/rhinnorhea.      Mouth/Throat: Mucous membranes are moist.      Neck: No stridor. Cardiovascular: Normal rate, regular rhythm. No murmurs, rubs, or gallops.  Left arm AV fistula appears to be working properly Respiratory: Normal respiratory effort without tachypnea nor retractions. Breath sounds are clear and equal bilaterally. No wheezes/rales/rhonchi. Gastrointestinal: Soft and nontender. Normal bowel sounds Musculoskeletal: Nontender with normal range of motion in extremities. No lower extremity tenderness nor edema.  Neurologic:  Normal speech and language. No gross focal neurologic deficits are appreciated.  Skin:  Skin is warm, dry and intact. No rash noted. Psychiatric: Mood and affect are normal. Speech and behavior are normal.  ____________________________________________  ED COURSE:  As part of my medical decision making, I reviewed the following data within the [place] History obtained from family if available, nursing notes, old chart and ekg, as well as notes from prior ED visits. Patient presented  for possible fluid retention, we will assess with labs and imaging as indicated at this time.   Procedures  Jeremy [name] was evaluated in Emergency Department on 07/02/2019 for the symptoms described in the history of present illness. He was evaluated in the context of the global COVID-19 pandemic, which necessitated consideration that the patient might be at risk for infection with the SARS-CoV-2 virus that causes COVID-19. Institutional protocols and algorithms that pertain to the evaluation of patients at risk for COVID-19 are in a state of rapid change based on information released by regulatory bodies including the CDC and federal and state organizations. These policies and algorithms were followed during the patient's care in the ED.  ____________________________________________   LABS (pertinent positives/negatives)  Labs Reviewed  CBC WITH DIFFERENTIAL/PLATELET - Abnormal; Notable for the following components:      Result Value   RBC 2.81 (*)    Hemoglobin 8.7 (*)    HCT 27.9 (*)    RDW 17.4 (*)    All other components within normal limits  COMPREHENSIVE METABOLIC PANEL - Abnormal; Notable for the following components:   Glucose, Bld 160 (*)    BUN 44 (*)    Creatinine, Ser 7.89 (*)    Calcium 8.7 (*)    Albumin 3.4 (*)    AST 14 (*)    Alkaline Phosphatase 153 (*)    GFR calc non Af Amer 7 (*)    GFR calc Af Amer 8 (*)    All other components within normal limits    RADIOLOGY Images were viewed by me  Chest x-ray IMPRESSION:  Interstitial pulmonary edema and mild stable cardiomegaly.   Aortic Atherosclerosis (ICD10-I70.0).  ____________________________________________   DIFFERENTIAL DIAGNOSIS   CHF, volume overload, renal failure, electrolyte abnormality, cervical radiculopathy, chronic pain  FINAL ASSESSMENT AND PLAN  End-stage renal disease on dialysis, left shoulder pain   Plan: The patient had presented for possible volume overload. Patient's labs did not  reveal any acute process. Patient's imaging did reveal mild signs of edema.  His oxygen saturation and respiratory rate are normal.  I have discussed with nephrology, he does not meet criteria for emergent dialysis.  We will have him follow-up on Monday as scheduled.   Laurence Aly, MD    Note: This note was generated in part or whole with voice recognition software. Voice recognition is usually quite accurate but there are transcription errors that can and very often do occur. I apologize for any typographical errors that were not detected and corrected.     Earleen Newport, MD 07/02/19 [phone]

## 2019-07-02 NOTE — ED Notes (Signed)
Pt states he is up to 71 kg but he is normally 64 kg.  He believes it is bc he is taking prednisone.  He states he just needs dialysis so that he can go to work tomorrow, but his dialysis center said they would not take more than 1 kilo/hr. He left the center without receiving dialysis.  Additionally pt is complaining of neck pain.

## 2019-07-02 NOTE — ED Triage Notes (Signed)
States has been on prednisone for one week due to L shoulder injury. States feels he is having fluid retention and is here with the hope of having an extra dialysis treatment. States had last dialysis treatment yesterday. States is now on prednisone 20mg  in am.

## 2019-07-08 ENCOUNTER — Other Ambulatory Visit: Payer: Self-pay | Admitting: Student

## 2019-07-08 DIAGNOSIS — G959 Disease of spinal cord, unspecified: Secondary | ICD-10-CM

## 2019-07-09 ENCOUNTER — Other Ambulatory Visit: Payer: Self-pay

## 2019-07-09 ENCOUNTER — Emergency Department: Payer: Medicare Other

## 2019-07-09 ENCOUNTER — Encounter: Payer: Self-pay | Admitting: Emergency Medicine

## 2019-07-09 ENCOUNTER — Emergency Department
Admission: EM | Admit: 2019-07-09 | Discharge: 2019-07-09 | Disposition: A | Discharge status: Home / Self Care | Payer: Medicare Other | Attending: Student | Admitting: Student

## 2019-07-09 DIAGNOSIS — Z79899 Other long term (current) drug therapy: Secondary | ICD-10-CM | POA: Insufficient documentation

## 2019-07-09 DIAGNOSIS — Z992 Dependence on renal dialysis: Secondary | ICD-10-CM | POA: Diagnosis not present

## 2019-07-09 DIAGNOSIS — M25512 Pain in left shoulder: Secondary | ICD-10-CM | POA: Diagnosis not present

## 2019-07-09 DIAGNOSIS — F17210 Nicotine dependence, cigarettes, uncomplicated: Secondary | ICD-10-CM | POA: Insufficient documentation

## 2019-07-09 DIAGNOSIS — G8929 Other chronic pain: Secondary | ICD-10-CM | POA: Insufficient documentation

## 2019-07-09 DIAGNOSIS — I120 Hypertensive chronic kidney disease with stage 5 chronic kidney disease or end stage renal disease: Secondary | ICD-10-CM | POA: Diagnosis not present

## 2019-07-09 DIAGNOSIS — N186 End stage renal disease: Secondary | ICD-10-CM | POA: Insufficient documentation

## 2019-07-09 LAB — PHOSPHORUS: Phosphorus: 9.1 mg/dL — ABNORMAL HIGH (ref 2.5–4.6)

## 2019-07-09 LAB — CBC WITH DIFFERENTIAL/PLATELET
Abs Immature Granulocytes: 0.04 10*3/uL (ref 0.00–0.07)
Basophils Absolute: 0 10*3/uL (ref 0.0–0.1)
Basophils Relative: 1 %
Eosinophils Absolute: 0.2 10*3/uL (ref 0.0–0.5)
Eosinophils Relative: 4 %
HCT: 30.4 % — ABNORMAL LOW (ref 39.0–52.0)
Hemoglobin: 9.3 g/dL — ABNORMAL LOW (ref 13.0–17.0)
Immature Granulocytes: 1 %
Lymphocytes Relative: 13 %
Lymphs Abs: 0.8 10*3/uL (ref 0.7–4.0)
MCH: 30.9 pg (ref 26.0–34.0)
MCHC: 30.6 g/dL (ref 30.0–36.0)
MCV: 101 fL — ABNORMAL HIGH (ref 80.0–100.0)
Monocytes Absolute: 0.6 10*3/uL (ref 0.1–1.0)
Monocytes Relative: 11 %
Neutro Abs: 4.3 10*3/uL (ref 1.7–7.7)
Neutrophils Relative %: 70 %
Platelets: 223 10*3/uL (ref 150–400)
RBC: 3.01 MIL/uL — ABNORMAL LOW (ref 4.22–5.81)
RDW: 18.6 % — ABNORMAL HIGH (ref 11.5–15.5)
WBC: 6 10*3/uL (ref 4.0–10.5)
nRBC: 0 % (ref 0.0–0.2)

## 2019-07-09 LAB — BASIC METABOLIC PANEL
Anion gap: 17 — ABNORMAL HIGH (ref 5–15)
BUN: 45 mg/dL — ABNORMAL HIGH (ref 6–20)
CO2: 25 mmol/L (ref 22–32)
Calcium: 8.8 mg/dL — ABNORMAL LOW (ref 8.9–10.3)
Chloride: 97 mmol/L — ABNORMAL LOW (ref 98–111)
Creatinine, Ser: 7.89 mg/dL — ABNORMAL HIGH (ref 0.61–1.24)
GFR calc Af Amer: 8 mL/min — ABNORMAL LOW (ref >60)
GFR calc non Af Amer: 7 mL/min — ABNORMAL LOW (ref >60)
Glucose, Bld: 132 mg/dL — ABNORMAL HIGH (ref 70–99)
Potassium: 5.7 mmol/L — ABNORMAL HIGH (ref 3.5–5.1)
Sodium: 139 mmol/L (ref 135–145)

## 2019-07-09 LAB — MAGNESIUM: Magnesium: 2.5 mg/dL — ABNORMAL HIGH (ref 1.7–2.4)

## 2019-07-09 MED ORDER — OXYCODONE-ACETAMINOPHEN 5-325 MG PO TABS: 1.0000 | ORAL_TABLET | ORAL | 0 refills | Status: DC | PRN

## 2019-07-09 MED ORDER — OXYCODONE-ACETAMINOPHEN 5-325 MG PO TABS
1.0000 | ORAL_TABLET | Freq: Once | ORAL | Status: AC
  Administered 2019-07-09: 15:00:00 1 via ORAL
  Filled 2019-07-09: qty 1

## 2019-07-09 MED ORDER — SODIUM ZIRCONIUM CYCLOSILICATE 10 G PO PACK
10.0000 g | PACK | Freq: Once | ORAL | Status: AC
  Administered 2019-07-09: 17:00:00 10 g via ORAL
  Filled 2019-07-09: qty 1

## 2019-07-09 NOTE — ED Notes (Signed)
Pt refusing EKG at this time. MD aware

## 2019-07-09 NOTE — Discharge Instructions (Addendum)
Please return for any increasing shortness of breath or any other problems.  Make sure you do not miss dialysis on Monday.  The [place] that I gave you here in the emergency room should take care of the potassium till Monday.  You can take the Percocet 1 pill 4 times a day as needed for pain.  Be very careful it can make you very sleepy and constipated.

## 2019-07-09 NOTE — ED Notes (Signed)
Pt unable to sign due to signature pad malfnx

## 2019-07-09 NOTE — ED Notes (Signed)
XR in room 

## 2019-07-09 NOTE — ED Notes (Signed)
Explained to pt that we were going to move him to the main ED

## 2019-07-09 NOTE — ED Notes (Signed)
Pt brother is in parking lot to pick up pt

## 2019-07-09 NOTE — ED Provider Notes (Addendum)
Discussed patient in detail with Dr. Joan Mayans and then also with Dr. Candiss Norse renal.  Dr. Candiss Norse feels the patient's labs are okay.  His chest x-ray is stable.  Patient refuses EKG adamantly says he is not here for his heart.  He will not listen to my explanation that high potassium will show up on the EKG.  He says EKG gave him a rash points to diffuse papular rash all over his whole trunk.  Discussed the patient's labs and x-ray with Dr. Candiss Norse.  He feels the patient can get Lokelma 10 g once and follow-up with dialysis Monday.  Patient insists on having some pain medicine to go home with.  He did come in here for shoulder pain so I will give him a small amount of Percocet to go home with.   Nena Polio, MD 07/09/19 1629    Nena Polio, MD 07/09/19 [phone]

## 2019-07-09 NOTE — ED Notes (Signed)
First Nurse Note: Pt to ED stating that he needs a cortisone shot. Pt also states that he may need dialysis because he "cannot control his hands". Pt is in NAD.

## 2019-07-09 NOTE — ED Provider Notes (Signed)
[facility] Emergency Department Provider Note  ____________________________________________   First MD Initiated Contact with Patient 07/09/19 1435     (approximate)  I have reviewed the triage vital signs and the nursing notes.  History  Chief Complaint Shoulder Pain    HPI Jeremy [name] is a 58 y.o. male with history of ESRD (MWF, last dialyzed F), cervical myelopathy who presents to the emergency department requesting dialysis and a cortisone injection.  Patient states he recently completed a course of prednisone for his cervical myelopathy.  He feels like this has caused him to retain more fluid than normal.  He states he feels "off" which makes him think that his creatinine is elevated.  Last week he dialyzed as scheduled on Monday, Wednesday, Friday, with an additional session on Thursday as well.  His last prednisone dose was on Thursday.  He is scheduled to see neurosurgery on Monday.  He denies any acute changes to his myelopathy and chronic ongoing pain.  He is requesting a cortisone injection because he feels like the systemic prednisone caused the fluid overload.  He denies any new weakness, numbness, tingling.  His pain is primarily in the left shoulder area, moderate in severity.  Requesting pain medication.   Past Medical Hx Past Medical History:  Diagnosis Date   Bronchitis    Hypertension    Renal disorder    kidney failure, dialysis    Problem List Patient Active Problem List   Diagnosis Date Noted   Atrial fibrillation with rapid ventricular response ([place]) 04/30/2019   Syncope 04/29/2019   Acute CHF (congestive heart failure) ([place]) 02/14/2019   Acute encephalopathy 11/23/2018   Influenza B 11/21/2018   Pulmonary edema 09/22/2018   Hyperkalemia 05/16/2018    Past Surgical Hx Past Surgical History:  Procedure Laterality Date   DIALYSIS FISTULA CREATION      Medications Prior to Admission medications   Medication Sig  Start Date End Date Taking? Authorizing Provider  albuterol (PROVENTIL HFA;VENTOLIN HFA) 108 (90 Base) MCG/ACT inhaler Inhale 2 puffs into the lungs every 6 (six) hours as needed for wheezing or shortness of breath. 11/19/18   Nena Polio, MD  amiodarone (PACERONE) 200 MG tablet Take 1 tablet (200 mg total) by mouth 2 (two) times daily. 05/13/19   Epifanio Lesches, MD  Budesonide 90 MCG/ACT inhaler Inhale 1 puff into the lungs 2 (two) times daily. 04/23/19 04/22/20  [provider]  butalbital-acetaminophen-caffeine (FIORICET) 50-325-40 MG tablet Take 1 tablet by mouth every 6 (six) hours as needed for headache. 05/20/19 05/19/20  Lilia Pro., MD  calcium acetate (PHOSLO) 667 MG capsule Take 667 mg by mouth 3 (three) times daily with meals.     [provider]  cinacalcet (SENSIPAR) 30 MG tablet Take 30 mg by mouth 3 (three) times a week. 09/04/15   [provider]  fluticasone (FLONASE) 50 MCG/ACT nasal spray Place 2 sprays into both nostrils daily. 04/05/19   Dustin Flock, MD  gabapentin (NEURONTIN) 300 MG capsule Take 1 capsule (300 mg total) by mouth 2 (two) times daily. 06/26/19 08/25/19  Menshew, Dannielle Karvonen, PA-C  ketorolac (TORADOL) 10 MG tablet Take 1 tablet (10 mg total) by mouth every 8 (eight) hours. 06/26/19   Menshew, Dannielle Karvonen, PA-C  metoCLOPramide (REGLAN) 5 MG tablet Take 1 tablet (5 mg total) by mouth every 8 (eight) hours as needed for up to 14 days for nausea. 06/26/19 07/10/19  Menshew, Dannielle Karvonen, PA-C  Multiple  Vitamin (MULTIVITAMIN WITH MINERALS) TABS tablet Take 1 tablet by mouth daily.    [provider]  nicotine (NICODERM CQ - DOSED IN MG/24 HOURS) 21 mg/24hr patch Place 1 patch onto the skin daily. 04/23/19 07/26/19  [provider]  nicotine polacrilex (NICORETTE) 4 MG gum Take 4 mg by mouth as directed. 04/23/19 07/28/19  [provider]  oxyCODONE-acetaminophen (PERCOCET) 5-325 MG tablet Take 1 tablet  by mouth 2 (two) times daily as needed. 07/02/19   Earleen Newport, MD  pantoprazole (PROTONIX) 40 MG tablet Take 1 tablet (40 mg total) by mouth daily. 04/05/19   Dustin Flock, MD    Allergies Patient has no known allergies.  Family Hx Family History  Problem Relation Age of Onset   Hypertension Mother     Social Hx Social History   Tobacco Use   Smoking status: Current Every Day Smoker    Packs/day: 1.00    Types: Cigarettes   Smokeless tobacco: Never Used  Substance Use Topics   Alcohol use: Yes    Frequency: Never    Comment: rare   Drug use: Yes    Types: Marijuana     Review of Systems  Constitutional: Negative for fever, chills. Eyes: Negative for visual changes. ENT: Negative for sore throat. Cardiovascular: Negative for chest pain. Respiratory: Negative for shortness of breath. Gastrointestinal: Negative for nausea, vomiting.  Genitourinary: Negative for dysuria. Musculoskeletal: + shoulder pain Skin: Negative for rash. Neurological: Negative for for headaches.   Physical Exam  Vital Signs: ED Triage Vitals  Enc Vitals Group     BP 07/09/19 1237 (!) 173/82     Pulse Rate 07/09/19 1237 85     Resp 07/09/19 1237 20     Temp 07/09/19 1237 98.1 F (36.7 C)     Temp Source 07/09/19 1237 Oral     SpO2 07/09/19 1237 97 %     Weight 07/09/19 1238 150 lb (68 kg)     Height 07/09/19 1238 5\' 6"  (1.676 m)     Head Circumference --      Peak Flow --      Pain Score 07/09/19 1238 10     Pain Loc --      Pain Edu? --      Excl. in [place]? --     Constitutional: Alert and oriented.  Head: Normocephalic. Atraumatic. Eyes: Conjunctivae clear. Sclera anicteric. Nose: No congestion. No rhinorrhea. Mouth/Throat: Mucous membranes are moist.  Neck: No stridor.   Cardiovascular: Normal rate. Extremities well perfused.  Fistula in LUE with palpable thrill. Respiratory: Normal respiratory effort.  Lungs CTAB.  No tachypnea or evidence of respiratory  distress.  No hypoxia.  Satting 98% on room air. Gastrointestinal: Soft. Non-tender. Non-distended.  Musculoskeletal: No lower extremity edema. No deformities.  Full range of motion to bilateral shoulders.  Distally, bilateral motor/sensation intact in radial, ulnar, median distribution. Neurologic:  Normal speech and language. No gross focal neurologic deficits are appreciated.  Skin: Skin is warm, dry and intact. No rash noted. Psychiatric: Mood and affect are appropriate for situation.  EKG  Patient refused.    Radiology  XR: IMPRESSION:  Stable cardiomegaly and pulmonary vascular congestion. No acute  findings.    Procedures  Procedure(s) performed (including critical care):  Procedures   Initial Impression / Assessment and Plan / ED Course  58 y.o. male who presents to the ED requesting dialysis.  On exam, he is moderately hypertensive, but otherwise there is no evidence of respiratory  distress.  No tachypnea, increased work of breathing, or hypoxia.  No evidence of lower extremity fluid overload.  We will obtain basic screening labs and XR to evaluate for emergent need for dialysis.  From a respiratory perspective he appears stable.  He understands if there is no indication for emergent dialysis, he should resume his normal schedule with his outpatient center.  With regards to his known cervical myelopathy, advised follow-up with neurosurgery as scheduled on Monday to discuss his request for potential cortisone injections.  No evidence today for any acute changes to his cervical myelopathy.   Final Clinical Impression(s) / ED Diagnosis  Final diagnoses:  ESRD (end stage renal disease) ([place])  Chronic left shoulder pain       Note:  This document was prepared using Dragon voice recognition software and may include unintentional dictation errors.   Lilia Pro., MD 07/09/19 [phone]

## 2019-07-09 NOTE — ED Triage Notes (Addendum)
States L shoulder pain x 2 months. States is seeking cortisone shot for shoulder. States has had severe fluid retention with steroids previous to this. States is dialysis patient and has missed no treatments.

## 2019-07-09 NOTE — ED Notes (Addendum)
See triage note  Presents with pain to left shoulder   States pain started about 2 months ago  Has had cortisone shot in past with relief  States he has been dizzy  And off balance    States he has not missed any treatments

## 2019-07-16 ENCOUNTER — Ambulatory Visit
Admission: RE | Admit: 2019-07-16 | Discharge: 2019-07-16 | Disposition: A | Discharge status: Home / Self Care | Payer: Medicare Other | Source: Ambulatory Visit | Attending: Student | Admitting: Student

## 2019-07-16 DIAGNOSIS — G959 Disease of spinal cord, unspecified: Secondary | ICD-10-CM | POA: Insufficient documentation

## 2019-07-26 ENCOUNTER — Other Ambulatory Visit: Payer: Self-pay

## 2019-07-26 ENCOUNTER — Emergency Department
Admission: EM | Admit: 2019-07-26 | Discharge: 2019-07-27 | Payer: Medicare Other | Attending: Family Medicine | Admitting: Family Medicine

## 2019-07-26 ENCOUNTER — Encounter: Payer: Self-pay | Admitting: *Deleted

## 2019-07-26 DIAGNOSIS — Z992 Dependence on renal dialysis: Secondary | ICD-10-CM | POA: Insufficient documentation

## 2019-07-26 DIAGNOSIS — I132 Hypertensive heart and chronic kidney disease with heart failure and with stage 5 chronic kidney disease, or end stage renal disease: Secondary | ICD-10-CM | POA: Diagnosis not present

## 2019-07-26 DIAGNOSIS — K921 Melena: Secondary | ICD-10-CM | POA: Diagnosis present

## 2019-07-26 DIAGNOSIS — I509 Heart failure, unspecified: Secondary | ICD-10-CM | POA: Insufficient documentation

## 2019-07-26 DIAGNOSIS — Z79899 Other long term (current) drug therapy: Secondary | ICD-10-CM | POA: Insufficient documentation

## 2019-07-26 DIAGNOSIS — Z20828 Contact with and (suspected) exposure to other viral communicable diseases: Secondary | ICD-10-CM | POA: Insufficient documentation

## 2019-07-26 DIAGNOSIS — N186 End stage renal disease: Secondary | ICD-10-CM | POA: Diagnosis not present

## 2019-07-26 DIAGNOSIS — D649 Anemia, unspecified: Secondary | ICD-10-CM | POA: Diagnosis not present

## 2019-07-26 DIAGNOSIS — Z8679 Personal history of other diseases of the circulatory system: Secondary | ICD-10-CM

## 2019-07-26 DIAGNOSIS — M542 Cervicalgia: Secondary | ICD-10-CM | POA: Diagnosis present

## 2019-07-26 DIAGNOSIS — F17210 Nicotine dependence, cigarettes, uncomplicated: Secondary | ICD-10-CM | POA: Insufficient documentation

## 2019-07-26 DIAGNOSIS — R531 Weakness: Secondary | ICD-10-CM | POA: Diagnosis present

## 2019-07-26 LAB — RETICULOCYTES
Immature Retic Fract: 29 % — ABNORMAL HIGH (ref 2.3–15.9)
RBC.: 1.93 MIL/uL — ABNORMAL LOW (ref 4.22–5.81)
Retic Count, Absolute: 118.9 10*3/uL (ref 19.0–186.0)
Retic Ct Pct: 6.2 % — ABNORMAL HIGH (ref 0.4–3.1)

## 2019-07-26 LAB — BASIC METABOLIC PANEL
Anion gap: 17 — ABNORMAL HIGH (ref 5–15)
BUN: 54 mg/dL — ABNORMAL HIGH (ref 6–20)
CO2: 24 mmol/L (ref 22–32)
Calcium: 8.9 mg/dL (ref 8.9–10.3)
Chloride: 99 mmol/L (ref 98–111)
Creatinine, Ser: 8.59 mg/dL — ABNORMAL HIGH (ref 0.61–1.24)
GFR calc Af Amer: 7 mL/min — ABNORMAL LOW (ref >60)
GFR calc non Af Amer: 6 mL/min — ABNORMAL LOW (ref >60)
Glucose, Bld: 134 mg/dL — ABNORMAL HIGH (ref 70–99)
Potassium: 4.9 mmol/L (ref 3.5–5.1)
Sodium: 140 mmol/L (ref 135–145)

## 2019-07-26 LAB — CBC
HCT: 20.5 % — ABNORMAL LOW (ref 39.0–52.0)
Hemoglobin: 6.3 g/dL — ABNORMAL LOW (ref 13.0–17.0)
MCH: 32.5 pg (ref 26.0–34.0)
MCHC: 30.7 g/dL (ref 30.0–36.0)
MCV: 105.7 fL — ABNORMAL HIGH (ref 80.0–100.0)
Platelets: 364 10*3/uL (ref 150–400)
RBC: 1.94 MIL/uL — ABNORMAL LOW (ref 4.22–5.81)
RDW: 19.2 % — ABNORMAL HIGH (ref 11.5–15.5)
WBC: 6.4 10*3/uL (ref 4.0–10.5)
nRBC: 0 % (ref 0.0–0.2)

## 2019-07-26 LAB — IRON AND TIBC
Iron: 41 ug/dL — ABNORMAL LOW (ref 45–182)
Saturation Ratios: 16 % — ABNORMAL LOW (ref 17.9–39.5)
TIBC: 253 ug/dL (ref 250–450)
UIBC: 212 ug/dL

## 2019-07-26 LAB — FERRITIN: Ferritin: 615 ng/mL — ABNORMAL HIGH (ref 24–336)

## 2019-07-26 LAB — PREPARE RBC (CROSSMATCH)

## 2019-07-26 LAB — FOLATE: Folate: 11.2 ng/mL (ref >5.9)

## 2019-07-26 LAB — ABO/RH: ABO/RH(D): O POS

## 2019-07-26 MED ORDER — ACETAMINOPHEN 650 MG RE SUPP: 650.0000 mg | Freq: Four times a day (QID) | RECTAL | Status: DC | PRN

## 2019-07-26 MED ORDER — ACETAMINOPHEN 500 MG PO TABS
1000.0000 mg | ORAL_TABLET | Freq: Once | ORAL | Status: AC
  Administered 2019-07-26: 21:00:00 1000 mg via ORAL
  Filled 2019-07-26: qty 2

## 2019-07-26 MED ORDER — SODIUM CHLORIDE 0.9 % IV SOLN
10.0000 mL/h | Freq: Once | INTRAVENOUS | Status: AC
  Administered 2019-07-26: 10 mL/h via INTRAVENOUS

## 2019-07-26 MED ORDER — ACETAMINOPHEN 325 MG PO TABS: 650.0000 mg | ORAL_TABLET | Freq: Four times a day (QID) | ORAL | Status: DC | PRN

## 2019-07-26 MED ORDER — PANTOPRAZOLE SODIUM 40 MG IV SOLR
40.0000 mg | Freq: Once | INTRAVENOUS | Status: AC
  Administered 2019-07-26: 40 mg via INTRAVENOUS
  Filled 2019-07-26: qty 40

## 2019-07-26 MED ORDER — METOCLOPRAMIDE HCL 10 MG PO TABS: 5.0000 mg | ORAL_TABLET | Freq: Three times a day (TID) | ORAL | Status: DC | PRN

## 2019-07-26 MED ORDER — ALBUTEROL SULFATE (2.5 MG/3ML) 0.083% IN NEBU: 2.5000 mg | INHALATION_SOLUTION | Freq: Four times a day (QID) | RESPIRATORY_TRACT | Status: DC | PRN

## 2019-07-26 MED ORDER — CALCIUM ACETATE (PHOS BINDER) 667 MG PO CAPS
667.0000 mg | ORAL_CAPSULE | Freq: Three times a day (TID) | ORAL | Status: DC
  Filled 2019-07-26 (×3): qty 1

## 2019-07-26 MED ORDER — CINACALCET HCL 30 MG PO TABS
30.0000 mg | ORAL_TABLET | ORAL | Status: DC
  Filled 2019-07-26: qty 1

## 2019-07-26 MED ORDER — SODIUM CHLORIDE 0.9% FLUSH: 3.0000 mL | Freq: Two times a day (BID) | INTRAVENOUS | Status: DC

## 2019-07-26 MED ORDER — SODIUM CHLORIDE 0.9% FLUSH: 3.0000 mL | INTRAVENOUS | Status: DC | PRN

## 2019-07-26 MED ORDER — AMIODARONE HCL 200 MG PO TABS
200.0000 mg | ORAL_TABLET | Freq: Two times a day (BID) | ORAL | Status: DC
  Filled 2019-07-26: qty 1

## 2019-07-26 MED ORDER — ONDANSETRON HCL 4 MG/2ML IJ SOLN
4.0000 mg | Freq: Once | INTRAMUSCULAR | Status: AC
  Administered 2019-07-26: 21:00:00 4 mg via INTRAVENOUS
  Filled 2019-07-26: qty 2

## 2019-07-26 MED ORDER — BUDESONIDE 90 MCG/ACT IN AEPB: INHALATION_SPRAY | Freq: Two times a day (BID) | RESPIRATORY_TRACT | Status: DC

## 2019-07-26 MED ORDER — NICOTINE 21 MG/24HR TD PT24: 21.0000 mg | MEDICATED_PATCH | Freq: Every day | TRANSDERMAL | Status: DC

## 2019-07-26 MED ORDER — HYDROCODONE-ACETAMINOPHEN 5-325 MG PO TABS: 1.0000 | ORAL_TABLET | ORAL | Status: DC | PRN

## 2019-07-26 MED ORDER — ONDANSETRON HCL 4 MG/2ML IJ SOLN: 4.0000 mg | Freq: Four times a day (QID) | INTRAMUSCULAR | Status: DC | PRN

## 2019-07-26 MED ORDER — SODIUM CHLORIDE 0.9 % IV SOLN: 250.0000 mL | INTRAVENOUS | Status: DC | PRN

## 2019-07-26 MED ORDER — MORPHINE SULFATE (PF) 4 MG/ML IV SOLN
4.0000 mg | INTRAVENOUS | Status: DC | PRN
  Administered 2019-07-26 – 2019-07-27 (×2): 4 mg via INTRAVENOUS
  Filled 2019-07-26 (×2): qty 1

## 2019-07-26 MED ORDER — ONDANSETRON HCL 4 MG PO TABS
4.0000 mg | ORAL_TABLET | Freq: Four times a day (QID) | ORAL | Status: DC | PRN
  Filled 2019-07-26: qty 1

## 2019-07-26 MED ORDER — GABAPENTIN 300 MG PO CAPS
300.0000 mg | ORAL_CAPSULE | Freq: Two times a day (BID) | ORAL | Status: DC
  Administered 2019-07-27: 300 mg via ORAL
  Filled 2019-07-26 (×3): qty 1

## 2019-07-26 MED ORDER — OXYCODONE-ACETAMINOPHEN 5-325 MG PO TABS: 1.0000 | ORAL_TABLET | Freq: Two times a day (BID) | ORAL | Status: DC | PRN

## 2019-07-26 MED ORDER — SODIUM CHLORIDE 0.9 % IV SOLN: Freq: Once | INTRAVENOUS | Status: DC

## 2019-07-26 NOTE — ED Notes (Signed)
Pt refusing EKG at this time, Shannon,RN made aware.

## 2019-07-26 NOTE — ED Notes (Signed)
Pt refusing to be put on cardiac monitor at this time.

## 2019-07-26 NOTE — ED Notes (Addendum)
Pt sitting in lobby with no distress noted; pt updated on wait time and plan of care; pt refuses to have EKG done; st "that nurse told me all I had to do was get a transfusion and go home and that's all I want"; explained to pt purpose of obtaining a baseline EKG but pt cont to refuses to have done

## 2019-07-26 NOTE — ED Provider Notes (Signed)
[facility] Emergency Department Provider Note    First MD Initiated Contact with Patient 07/26/19 2024     (approximate)  I have reviewed the triage vital signs and the nursing notes.   HISTORY  Chief Complaint Anemia and Neck Pain    HPI Jeremy [name] is a 58 y.o. male with the below listed past medical history presents to the ER direction of his PCP due to concern for low hemoglobin.  States has been feeling weak and rundown for the past 3 weeks.  States that over 1 month ago he was having nearly 1 week of dark black tarry stools.  States that at that time he was taking several doses of aspirin daily for neck pain which he has been dealing with.  States he has a pinched nerve.  Since then is stopped taking the aspirin the dark tarry stools have subsided but he still feeling weak.  Denies any fevers.  Not any other blood thinners.  Gets dialysis Monday Wednesday Friday.    Past Medical History:  Diagnosis Date  . Bronchitis   . Hypertension   . Renal disorder    kidney failure, dialysis   Family History  Problem Relation Age of Onset  . Hypertension Mother    Past Surgical History:  Procedure Laterality Date  . DIALYSIS FISTULA CREATION     Patient Active Problem List   Diagnosis Date Noted  . Symptomatic anemia 07/26/2019  . ESRD (end stage renal disease) ([place]) 07/26/2019  . Neck pain 07/26/2019  . Melena 07/26/2019  . Hx of atrial fibrillation without current medication 07/26/2019  . Atrial fibrillation with rapid ventricular response ([place]) 04/30/2019  . Syncope 04/29/2019  . Acute CHF (congestive heart failure) ([place]) 02/14/2019  . Acute encephalopathy 11/23/2018  . Influenza B 11/21/2018  . Pulmonary edema 09/22/2018  . Hyperkalemia 05/16/2018      Prior to Admission medications   Medication Sig Start Date End Date Taking? Authorizing Provider  albuterol (PROVENTIL HFA;VENTOLIN HFA) 108 (90 Base) MCG/ACT inhaler Inhale 2 puffs into the  lungs every 6 (six) hours as needed for wheezing or shortness of breath. 11/19/18   Nena Polio, MD  amiodarone (PACERONE) 200 MG tablet Take 1 tablet (200 mg total) by mouth 2 (two) times daily. 05/13/19   Epifanio Lesches, MD  Budesonide 90 MCG/ACT inhaler Inhale 1 puff into the lungs 2 (two) times daily. 04/23/19 04/22/20  [provider]  butalbital-acetaminophen-caffeine (FIORICET) 50-325-40 MG tablet Take 1 tablet by mouth every 6 (six) hours as needed for headache. 05/20/19 05/19/20  Lilia Pro., MD  calcium acetate (PHOSLO) 667 MG capsule Take 667 mg by mouth 3 (three) times daily with meals.     [provider]  cinacalcet (SENSIPAR) 30 MG tablet Take 30 mg by mouth 3 (three) times a week. 09/04/15   [provider]  fluticasone (FLONASE) 50 MCG/ACT nasal spray Place 2 sprays into both nostrils daily. 04/05/19   Dustin Flock, MD  gabapentin (NEURONTIN) 300 MG capsule Take 1 capsule (300 mg total) by mouth 2 (two) times daily. 06/26/19 08/25/19  Menshew, Dannielle Karvonen, PA-C  ketorolac (TORADOL) 10 MG tablet Take 1 tablet (10 mg total) by mouth every 8 (eight) hours. 06/26/19   Menshew, Dannielle Karvonen, PA-C  metoCLOPramide (REGLAN) 5 MG tablet Take 1 tablet (5 mg total) by mouth every 8 (eight) hours as needed for up to 14 days for nausea. 06/26/19 07/10/19  Menshew, Dannielle Karvonen, PA-C  Multiple Vitamin (MULTIVITAMIN WITH MINERALS) TABS tablet Take 1 tablet by mouth daily.    [provider]  nicotine (NICODERM CQ - DOSED IN MG/24 HOURS) 21 mg/24hr patch Place 1 patch onto the skin daily. 04/23/19 07/26/19  [provider]  nicotine polacrilex (NICORETTE) 4 MG gum Take 4 mg by mouth as directed. 04/23/19 07/28/19  [provider]  oxyCODONE-acetaminophen (PERCOCET) 5-325 MG tablet Take 1 tablet by mouth 2 (two) times daily as needed. 07/02/19   Earleen Newport, MD  oxyCODONE-acetaminophen (PERCOCET) 5-325 MG tablet Take 1 tablet  by mouth every 4 (four) hours as needed for severe pain. 07/09/19 07/08/20  Nena Polio, MD  pantoprazole (PROTONIX) 40 MG tablet Take 1 tablet (40 mg total) by mouth daily. 04/05/19   Dustin Flock, MD    Allergies Patient has no known allergies.    Social History Social History   Tobacco Use  . Smoking status: Current Every Day Smoker    Packs/day: 0.50    Types: Cigarettes  . Smokeless tobacco: Never Used  Substance Use Topics  . Alcohol use: Yes    Frequency: Never    Comment: rare  . Drug use: Yes    Types: Marijuana    Review of Systems Patient denies headaches, rhinorrhea, blurry vision, numbness, shortness of breath, chest pain, edema, cough, abdominal pain, nausea, vomiting, diarrhea, dysuria, fevers, rashes or hallucinations unless otherwise stated above in HPI. ____________________________________________   PHYSICAL EXAM:  VITAL SIGNS: Vitals:   07/26/19 2038 07/26/19 2150  BP:  (!) 160/95  Pulse: 85 81  Resp:  16  Temp:  97.8 F (36.6 C)  SpO2: 93% 99%    Constitutional: Alert and oriented.  Eyes: Conjunctivae are normal.  Head: Atraumatic. Nose: No congestion/rhinnorhea. Mouth/Throat: Mucous membranes are moist.   Neck: No stridor. Painless ROM.  Cardiovascular: Normal rate, regular rhythm. Grossly normal heart sounds.  Good peripheral circulation. Respiratory: Normal respiratory effort.  No retractions. Lungs CTAB. Gastrointestinal: Soft and nontender. No distention. No abdominal bruits. No CVA tenderness. Genitourinary:  Musculoskeletal: No lower extremity tenderness nor edema.  No joint effusions. Neurologic:  Normal speech and language. No gross focal neurologic deficits are appreciated. No facial droop Skin:  Skin is warm, dry and intact. No rash noted. Psychiatric: Mood and affect are normal. Speech and behavior are normal.  ____________________________________________   LABS (all labs ordered are listed, but only abnormal results  are displayed)  Results for orders placed or performed during the hospital encounter of 07/26/19 (from the past 24 hour(s))  Basic metabolic panel     Status: Abnormal   Collection Time: 07/26/19  5:57 PM  Result Value Ref Range   Sodium 140 135 - 145 mmol/L   Potassium 4.9 3.5 - 5.1 mmol/L   Chloride 99 98 - 111 mmol/L   CO2 24 22 - 32 mmol/L   Glucose, Bld 134 (H) 70 - 99 mg/dL   BUN 54 (H) 6 - 20 mg/dL   Creatinine, Ser 8.59 (H) 0.61 - 1.24 mg/dL   Calcium 8.9 8.9 - 10.3 mg/dL   GFR calc non Af Amer 6 (L) >60 mL/min   GFR calc Af Amer 7 (L) >60 mL/min   Anion gap 17 (H) 5 - 15  CBC     Status: Abnormal   Collection Time: 07/26/19  5:57 PM  Result Value Ref Range   WBC 6.4 4.0 - 10.5 K/uL   RBC 1.94 (L) 4.22 - 5.81 MIL/uL   Hemoglobin 6.3 (  L) 13.0 - 17.0 g/dL   HCT 20.5 (L) 39.0 - 52.0 %   MCV 105.7 (H) 80.0 - 100.0 fL   MCH 32.5 26.0 - 34.0 pg   MCHC 30.7 30.0 - 36.0 g/dL   RDW 19.2 (H) 11.5 - 15.5 %   Platelets 364 150 - 400 K/uL   nRBC 0.0 0.0 - 0.2 %  Type and screen [place]     Status: None (Preliminary result)   Collection Time: 07/26/19  5:57 PM  Result Value Ref Range   ABO/RH(D) O POS    Antibody Screen NEG    Sample Expiration 07/29/2019,2359    Unit Number [id number]    Blood Component Type RED CELLS,LR    Unit division 00    Status of Unit ISSUED    Transfusion Status OK TO TRANSFUSE    Crossmatch Result      Compatible Performed at [facility], [address] [place], [place] [address]    Unit Number [id number]    Blood Component Type RBC LR PHER2    Unit division 00    Status of Unit ALLOCATED    Transfusion Status OK TO TRANSFUSE    Crossmatch Result Compatible   Prepare RBC     Status: None   Collection Time: 07/26/19  8:48 PM  Result Value Ref Range   Order Confirmation      ORDER PROCESSED BY BLOOD BANK Performed at [facility], [address], [place], [place] [address]   ABO/Rh      Status: None   Collection Time: 07/26/19  8:48 PM  Result Value Ref Range   ABO/RH(D)      O POS Performed at [facility], [address] [place], [place] [address]   Reticulocytes     Status: Abnormal   Collection Time: 07/26/19  9:50 PM  Result Value Ref Range   Retic Ct Pct 6.2 (H) 0.4 - 3.1 %   RBC. 1.93 (L) 4.22 - 5.81 MIL/uL   Retic Count, Absolute 118.9 19.0 - 186.0 K/uL   Immature Retic Fract 29.0 (H) 2.3 - 15.9 %   ____________________________________________  EKG My review and personal interpretation at Time: 21:24   Indication: weakness  Rate: 80  Rhythm: sinus Axis: normal Other: nonspecific st abn, no stemi ____________________________________________  RADIOLOGY  I personally reviewed all radiographic images ordered to evaluate for the above acute complaints and reviewed radiology reports and findings.  These findings were personally discussed with the patient.  Please see medical record for radiology report.  ____________________________________________   PROCEDURES  Procedure(s) performed:  .Critical Care Performed by: Merlyn Lot, MD Authorized by: Merlyn Lot, MD   Critical care provider statement:    Critical care time (minutes):  5   Critical care time was exclusive of:  Separately billable procedures and treating other patients   Critical care was necessary to treat or prevent imminent or life-threatening deterioration of the following conditions:  Circulatory failure   Critical care was time spent personally by me on the following activities:  Development of treatment plan with patient or surrogate, discussions with consultants, evaluation of patient's response to treatment, examination of patient, obtaining history from patient or surrogate, ordering and performing treatments and interventions, ordering and review of laboratory studies, ordering and review of radiographic studies, pulse oximetry, re-evaluation of patient's  condition and review of old charts      Critical Care performed: yes ____________________________________________   INITIAL IMPRESSION / ASSESSMENT AND  PLAN / ED COURSE  Pertinent labs & imaging results that were available during my care of the patient were reviewed by me and considered in my medical decision making (see chart for details).   DDX: Anemia, electrolyte abnormality, ACS, dehydration GI bleed, anemia of chronic disease.  Nikash Bense is a 58 y.o. who presents to the ED with Symptoms as described above with evidence of symptomatic anemia likely secondary to recent GI bleed as the patient was taking high doses of aspirin but is since stopped taking that he is not have any melena currently.  Will give a single dose of Protonix do not feel that infusion or octreotide indicated.  Will transfuse.  Patient require hospitalization for further medical work-up.     The patient was evaluated in Emergency Department today for the symptoms described in the history of present illness. He/she was evaluated in the context of the global COVID-19 pandemic, which necessitated consideration that the patient might be at risk for infection with the SARS-CoV-2 virus that causes COVID-19. Institutional protocols and algorithms that pertain to the evaluation of patients at risk for COVID-19 are in a state of rapid change based on information released by regulatory bodies including the CDC and federal and state organizations. These policies and algorithms were followed during the patient's care in the ED.  As part of my medical decision making, I reviewed the following data within the [place] notes reviewed and incorporated, Labs reviewed, notes from prior ED visits and [place] Controlled Substance Database   ____________________________________________   FINAL CLINICAL IMPRESSION(S) / ED DIAGNOSES  Final diagnoses:  Symptomatic anemia  ESRD on dialysis ([place])      NEW  MEDICATIONS STARTED DURING THIS VISIT:  New Prescriptions   No medications on file     Note:  This document was prepared using Dragon voice recognition software and may include unintentional dictation errors.    Merlyn Lot, MD 07/26/19 2225

## 2019-07-26 NOTE — ED Notes (Signed)
Blood consent located in pt chart.

## 2019-07-26 NOTE — ED Triage Notes (Signed)
PT to ED for two complaints. MD called and told pt his Hgb is too low and he needed to come to the ED for a blood transfusion. Unknown value at this time. General fatigue x 3 weeks. Pain also reported in pts neck. Pt verbalized concern for a pinched nerve that he reports he has been taking pain medication for at home with a small amount of relief.

## 2019-07-26 NOTE — H&P (Signed)
Jeremy [name] [id number] DOB: 03/18/1961 DOA: 07/26/2019     PCP: getting care at [facility]:    Patient arrived to ER on 07/26/19 at 100  Patient coming from: home Lives alone,        Chief Complaint  Chief Complaint  Patient presents with  . Anemia  . Neck Pain    HPI: Jeremy [name] is a 58 y.o. male with medical history significant of end-stage renal disease on hemodialysis Monday Wednesday Friday,  Hypertension, history of atrial fibrillation, history of CHF    Presented with   patient was told that his blood counts were still low he needs blood transfusion.  He has been having generalized weakness and fatigue as well as neck pain.  He thinks he has a pinched nerve in his neck.  He has been taking pain medications for that but did not seem to help much. Reports last week he have had dark black tarry stools.  He was taking a lot of aspirin to time to control his neck pain Stop taking aspirins and dark starry stools have improved.  No fevers he is not on any blood thinners patient history of end-stage renal disease on hemodialysis Monday Wednesday Friday Has also been prescribed Toradol He is unsure if taking it reports he has been isolating himself and only leaves the house to get HD  Infectious risk factors:  Reports shortness of breath,  In  ER RAPID COVID TEST  in house testing  Pending  Lab Results  Component Value Date   [place] Dezza 04/29/2019   [place] NEGATIVE 04/02/2019   [place] NEGATIVE 02/14/2019   [place] NEGATIVE 01/31/2019     Regarding pertinent Chronic problems:        CHF  systolic  - last echo [id number] ejection fraction of 35-40%.     A. Fib -  - CHA2DS2 vas score 2  Not on anticoagulation           - Rhythm control:   amiodarone,    ESRD- on HD MWF Lab Results  Component Value Date   CREATININE 8.59 (H) 07/26/2019   CREATININE 7.89 (H) 07/09/2019   CREATININE 7.89 (H) 07/02/2019    While in ER:  Protonix dose ordered  The following Work up has been ordered so far:  Orders Placed This Encounter  Procedures  . SARS CORONAVIRUS 2 (TAT 6-24 HRS) Nasopharyngeal Nasopharyngeal Swab  . Basic metabolic panel  . CBC  . Cardiac monitoring  . Complete patient signature process for consent form  . Practitioner attestation of consent  . Consult to hospitalist  ALL PATIENTS BEING ADMITTED/HAVING PROCEDURES NEED COVID-19 SCREENING  . Pulse oximetry, continuous  . ED EKG  . Type and screen [place]  . Prepare RBC     Following Medications were ordered in ER: Medications  0.9 %  sodium chloride infusion (has no administration in time range)  pantoprazole (PROTONIX) injection 40 mg (has no administration in time range)  acetaminophen (TYLENOL) tablet 1,000 mg (has no administration in time range)  morphine 4 MG/ML injection 4 mg (has no administration in time range)  ondansetron (ZOFRAN) injection 4 mg (has no administration in time range)        Consult Orders  (From admission, onward)         Start     Ordered   07/26/19 2040  Consult to hospitalist  ALL PATIENTS BEING ADMITTED/HAVING PROCEDURES NEED COVID-19 SCREENING  Once    Comments:  ALL PATIENTS BEING ADMITTED/HAVING PROCEDURES NEED COVID-19 SCREENING  Provider:  (Not yet assigned)  Question Answer Comment  Place call to: [phone]   Reason for Consult Admit   Diagnosis/Clinical Info for Consult: [id number]      07/26/19 2040          Significant initial  Findings: Abnormal Labs Reviewed  BASIC METABOLIC PANEL - Abnormal; Notable for the following components:      Result Value   Glucose, Bld 134 (*)    BUN 54 (*)    Creatinine, Ser 8.59 (*)    GFR calc non Af Amer 6 (*)    GFR calc Af Amer 7 (*)    Anion gap 17 (*)    All other components within normal limits  CBC - Abnormal; Notable for the following components:   RBC 1.94 (*)    Hemoglobin 6.3 (*)    HCT 20.5 (*)    MCV 105.7 (*)     RDW 19.2 (*)    All other components within normal limits    Otherwise labs showing:    Recent Labs  Lab 07/26/19 1757  NA 140  K 4.9  CO2 24  GLUCOSE 134*  BUN 54*  CREATININE 8.59*  CALCIUM 8.9    Cr  stable,     Lab Results  Component Value Date   CREATININE 8.59 (H) 07/26/2019   CREATININE 7.89 (H) 07/09/2019   CREATININE 7.89 (H) 07/02/2019    No results for input(s): AST, ALT, ALKPHOS, BILITOT, PROT, ALBUMIN in the last 168 hours. Lab Results  Component Value Date   CALCIUM 8.9 07/26/2019   PHOS 9.1 (H) 07/09/2019     WBC      Component Value Date/Time   WBC 6.4 07/26/2019 1757   ANC    Component Value Date/Time   NEUTROABS 4.3 07/09/2019 1509   ALC No components found for: LYMPHAB    Plt: Lab Results  Component Value Date   PLT 364 07/26/2019     Lactic Acid, Venous    Component Value Date/Time   LATICACIDVEN 0.6 04/02/2019 1703     COVID-19 Labs  No results for input(s): DDIMER, FERRITIN, LDH, CRP in the last 72 hours.  Lab Results  Component Value Date   SARSCOV2NAA NEGATIVE 04/29/2019   SARSCOV2NAA NEGATIVE 04/02/2019   [place] NEGATIVE 02/14/2019   [place] NEGATIVE 01/31/2019      HG/HCT  Down   from baseline see below    Component Value Date/Time   HGB 6.3 (L) 07/26/2019 1757   HCT 20.5 (L) 07/26/2019 1757    Troponin  Not ordered     ECG: Ordered pt refused    BNP (last 3 results) Recent Labs    11/23/18 1021 02/14/19 1342 04/29/19 1522  BNP 414.0* 4,218.0* 1,632.0*    ProBNP (last 3 results) No results for input(s): PROBNP in the last 8760 hours.  DM  labs:  HbA1C: Recent Labs    11/20/18 0520 05/02/19 0900  HGBA1C 5.7* 7.6*          ED Triage Vitals  Enc Vitals Group     BP 07/26/19 1745 (!) 169/66     Pulse Rate 07/26/19 1745 84     Resp 07/26/19 1745 16     Temp 07/26/19 1745 98.3 F (36.8 C)     Temp Source 07/26/19 1745 Oral     SpO2 07/26/19 1745 94 %     Weight 07/26/19 1746  145 lb (65.8 kg)  Height 07/26/19 1746 5\' 6"  (1.676 m)     Head Circumference --      Peak Flow --      Pain Score 07/26/19 1746 9     Pain Loc --      Pain Edu? --      Excl. in [place]? --   TMAX(24)@       Latest  Blood pressure (!) 164/83, pulse 85, temperature 98.3 F (36.8 C), temperature source Oral, resp. rate 16, height 5\' 6"  (1.676 m), weight 65.8 kg, SpO2 93 %.    Hospitalist was called for admission for symptomatic anemia   Review of Systems:    Pertinent positives include: fatigue  Constitutional:  No weight loss, night sweats, Fevers, chills, , weight loss  HEENT:  No headaches, Difficulty swallowing,Tooth/dental problems,Sore throat,  No sneezing, itching, ear ache, nasal congestion, post nasal drip,  Cardio-vascular:  No chest pain, Orthopnea, PND, anasarca, dizziness, palpitations.no Bilateral lower extremity swelling  GI:  No heartburn, indigestion, abdominal pain, nausea, vomiting, diarrhea, change in bowel habits, loss of appetite, melena, blood in stool, hematemesis Resp:  no shortness of breath at rest. No dyspnea on exertion, No excess mucus, no productive cough, No non-productive cough, No coughing up of blood.No change in color of mucus.No wheezing. Skin:  no rash or lesions. No jaundice GU:  no dysuria, change in color of urine, no urgency or frequency. No straining to urinate.  No flank pain.  Musculoskeletal:  No joint pain or no joint swelling. No decreased range of motion. No back pain.  Psych:  No change in mood or affect. No depression or anxiety. No memory loss.  Neuro: no localizing neurological complaints, no tingling, no weakness, no double vision, no gait abnormality, no slurred speech, no confusion  All systems reviewed and apart from Jeremy [name] all are negative  Past Medical History:   Past Medical History:  Diagnosis Date  . Bronchitis   . Hypertension   . Renal disorder    kidney failure, dialysis      Past Surgical History:   Procedure Laterality Date  . DIALYSIS FISTULA CREATION      Social History:  Ambulatory   independently      reports that he has been smoking cigarettes. He has been smoking about 0.50 packs per day. He has never used smokeless tobacco. He reports current alcohol use. He reports current drug use. Drug: Marijuana.     Family History:   Family History  Problem Relation Age of Onset  . Hypertension Mother     Allergies: No Known Allergies   Prior to Admission medications   Medication Sig Start Date End Date Taking? Authorizing Provider  albuterol (PROVENTIL HFA;VENTOLIN HFA) 108 (90 Base) MCG/ACT inhaler Inhale 2 puffs into the lungs every 6 (six) hours as needed for wheezing or shortness of breath. 11/19/18   Nena Polio, MD  amiodarone (PACERONE) 200 MG tablet Take 1 tablet (200 mg total) by mouth 2 (two) times daily. 05/13/19   Epifanio Lesches, MD  Budesonide 90 MCG/ACT inhaler Inhale 1 puff into the lungs 2 (two) times daily. 04/23/19 04/22/20  [provider]  butalbital-acetaminophen-caffeine (FIORICET) 50-325-40 MG tablet Take 1 tablet by mouth every 6 (six) hours as needed for headache. 05/20/19 05/19/20  Lilia Pro., MD  calcium acetate (PHOSLO) 667 MG capsule Take 667 mg by mouth 3 (three) times daily with meals.     [provider]  cinacalcet (SENSIPAR) 30 MG tablet Take 30 mg  by mouth 3 (three) times a week. 09/04/15   [provider]  fluticasone (FLONASE) 50 MCG/ACT nasal spray Place 2 sprays into both nostrils daily. 04/05/19   Dustin Flock, MD  gabapentin (NEURONTIN) 300 MG capsule Take 1 capsule (300 mg total) by mouth 2 (two) times daily. 06/26/19 08/25/19  Menshew, Dannielle Karvonen, PA-C  ketorolac (TORADOL) 10 MG tablet Take 1 tablet (10 mg total) by mouth every 8 (eight) hours. 06/26/19   Menshew, Dannielle Karvonen, PA-C  metoCLOPramide (REGLAN) 5 MG tablet Take 1 tablet (5 mg total) by mouth every 8 (eight) hours as needed for up to  14 days for nausea. 06/26/19 07/10/19  Menshew, Dannielle Karvonen, PA-C  Multiple Vitamin (MULTIVITAMIN WITH MINERALS) TABS tablet Take 1 tablet by mouth daily.    [provider]  nicotine (NICODERM CQ - DOSED IN MG/24 HOURS) 21 mg/24hr patch Place 1 patch onto the skin daily. 04/23/19 07/26/19  [provider]  nicotine polacrilex (NICORETTE) 4 MG gum Take 4 mg by mouth as directed. 04/23/19 07/28/19  [provider]  oxyCODONE-acetaminophen (PERCOCET) 5-325 MG tablet Take 1 tablet by mouth 2 (two) times daily as needed. 07/02/19   Earleen Newport, MD  oxyCODONE-acetaminophen (PERCOCET) 5-325 MG tablet Take 1 tablet by mouth every 4 (four) hours as needed for severe pain. 07/09/19 07/08/20  Nena Polio, MD  pantoprazole (PROTONIX) 40 MG tablet Take 1 tablet (40 mg total) by mouth daily. 04/05/19   Dustin Flock, MD   Physical Exam: Blood pressure (!) 164/83, pulse 85, temperature 98.3 F (36.8 C), temperature source Oral, resp. rate 16, height 5\' 6"  (1.676 m), weight 65.8 kg, SpO2 93 %. 1. General:  in No Acute distress    Chronically ill -appearing 2. Psychological: Alert and   Oriented 3. Head/ENT:     Dry Mucous Membranes                          Head Non traumatic, neck supple                            Poor Dentition 4. SKIN: normal  Skin turgor,  Skin clean Dry and intact no rash 5. Heart: Regular rate and rhythm no  Murmur, no Rub or gallop 6. Lungs:  Clear to auscultation bilaterally, no wheezes or crackles   7. Abdomen: Soft,  non-tender, Non distended  bowel sounds present 8. Lower extremities: no clubbing, cyanosis, no  edema 9. Neurologically Grossly intact, moving all 4 extremities equally  10. MSK: Normal range of motion   All other LABS:     Recent Labs  Lab 07/26/19 1757  WBC 6.4  HGB 6.3*  HCT 20.5*  MCV 105.7*  PLT 364     Recent Labs  Lab 07/26/19 1757  NA 140  K 4.9  CL 99  CO2 24  GLUCOSE 134*  BUN 54*  CREATININE  8.59*  CALCIUM 8.9     No results for input(s): AST, ALT, ALKPHOS, BILITOT, PROT, ALBUMIN in the last 168 hours.     Cultures:    Component Value Date/Time   SDES BLOOD BLOOD RIGHT HAND 04/02/2019 1419   SPECREQUEST  04/02/2019 1419    BOTTLES DRAWN AEROBIC AND ANAEROBIC Blood Culture results may not be optimal due to an inadequate volume of blood received in culture bottles   CULT  04/02/2019 1419    NO  GROWTH 5 DAYS Performed at [facility], [address] Madelaine Bhat [place], [place] [address]    REPTSTATUS 04/07/2019 FINAL 04/02/2019 1419     Radiological Exams on Admission: No results found.  Chart has been reviewed    Assessment/Plan   58 y.o. male with medical history significant of end-stage renal disease on hemodialysis Monday Wednesday Friday,  Hypertension, history of atrial fibrillation, history of CHF     Admitted for Intermatic anemia in a setting of   of melena secondary to recent use of NSAIDs  Present on Admission: . Symptomatic anemia transfuse 1 unit and follow serial CBC.  . Melena -suspect upper GI bleed in the setting of NSAID use.  Stop NSAIDs.  Protonix twice daily.  Discussed with GI clear liquids for tonight and n.p.o. post midnight Would benefit from EGD  . ESRD (end stage renal disease) ([place]) -chronic continue HD Due tomorrow Wednesday.  Discussed with nephrology who will see patient in consult tomorrow  . Neck pain -secondary to foraminal impingement.  Continue Neurontin.  Avoid NSAIDs Continue following up as an outpatient  hx of A.fib -appears to be an isolated event patient on amiodarone not on anticoagulation  Other plan as per orders.  DVT prophylaxis:  SCD    Code Status:  FULL CODE as per patient   I had personally discussed CODE STATUS with patient  Family Communication:   Family not at  Bedside    Disposition Plan:        To home once workup is complete and patient is stable                                       Consults  called: Nephrology Dr. Candiss Norse been contacted will see in AM GI Dr. Vicente Males has been contacted will see in AM  Admission status:  ED Disposition    ED Disposition Condition [place]: [place] [[id number]]  Level of Care: Med-Surg [16]  Covid Evaluation: Asymptomatic Screening Protocol (No Symptoms)  Diagnosis: Symptomatic anemia [id number]  Admitting Physician: Toy Baker [[id number]]  Attending Physician: Toy Baker [[id number]]  PT Class (Do Not Modify): Observation [104]  PT Acc Code (Do Not Modify): Observation [10022]       Obs     Level of care   medical floor    patient refuses telemetry states that telemetry stickers damaged skin in the past   Precautions:  NONE  No active isolations  PPE: Used by the provider:   P100  eye Goggles,  Gloves      Emmilynn Marut 07/26/2019, 10:24 PM    [facility]     after 2 AM please page floor coverage PA If 7AM-7PM, please contact the day team taking care of the patient using [email or web address]

## 2019-07-27 DIAGNOSIS — D649 Anemia, unspecified: Secondary | ICD-10-CM | POA: Diagnosis not present

## 2019-07-27 LAB — CBC WITH DIFFERENTIAL/PLATELET
Abs Immature Granulocytes: 0.14 10*3/uL — ABNORMAL HIGH (ref 0.00–0.07)
Basophils Absolute: 0.1 10*3/uL (ref 0.0–0.1)
Basophils Relative: 1 %
Eosinophils Absolute: 0.1 10*3/uL (ref 0.0–0.5)
Eosinophils Relative: 2 %
HCT: 29.3 % — ABNORMAL LOW (ref 39.0–52.0)
Hemoglobin: 9.4 g/dL — ABNORMAL LOW (ref 13.0–17.0)
Immature Granulocytes: 2 %
Lymphocytes Relative: 14 %
Lymphs Abs: 1 10*3/uL (ref 0.7–4.0)
MCH: 31.5 pg (ref 26.0–34.0)
MCHC: 32.1 g/dL (ref 30.0–36.0)
MCV: 98.3 fL (ref 80.0–100.0)
Monocytes Absolute: 0.7 10*3/uL (ref 0.1–1.0)
Monocytes Relative: 9 %
Neutro Abs: 5.5 10*3/uL (ref 1.7–7.7)
Neutrophils Relative %: 72 %
Platelets: 324 10*3/uL (ref 150–400)
RBC: 2.98 MIL/uL — ABNORMAL LOW (ref 4.22–5.81)
RDW: 19.6 % — ABNORMAL HIGH (ref 11.5–15.5)
WBC: 7.5 10*3/uL (ref 4.0–10.5)
nRBC: 0 % (ref 0.0–0.2)

## 2019-07-27 LAB — VITAMIN B12: Vitamin B-12: 1429 pg/mL — ABNORMAL HIGH (ref 180–914)

## 2019-07-27 LAB — PHOSPHORUS: Phosphorus: 10.1 mg/dL — ABNORMAL HIGH (ref 2.5–4.6)

## 2019-07-27 LAB — SARS CORONAVIRUS 2 (TAT 6-24 HRS): SARS Coronavirus 2: NEGATIVE

## 2019-07-27 LAB — PREPARE RBC (CROSSMATCH)

## 2019-07-27 LAB — MAGNESIUM: Magnesium: 2.6 mg/dL — ABNORMAL HIGH (ref 1.7–2.4)

## 2019-07-27 LAB — TSH: TSH: 4.5 u[IU]/mL (ref 0.350–4.500)

## 2019-07-27 MED ORDER — EPOETIN ALFA 10000 UNIT/ML IJ SOLN: 10000.0000 [IU] | INTRAMUSCULAR | Status: DC

## 2019-07-27 MED ORDER — CHLORHEXIDINE GLUCONATE 4 % EX LIQD: 6.0000 "application " | Freq: Every day | CUTANEOUS | Status: DC

## 2019-07-27 MED ORDER — PANTOPRAZOLE SODIUM 40 MG IV SOLR: 40.0000 mg | INTRAVENOUS | Status: DC

## 2019-07-27 MED ORDER — AMIODARONE HCL 100 MG PO TABS
200.0000 mg | ORAL_TABLET | Freq: Two times a day (BID) | ORAL | Status: DC
  Administered 2019-07-27: 01:00:00 200 mg via ORAL
  Filled 2019-07-27 (×3): qty 2

## 2019-07-27 MED ORDER — BUDESONIDE 0.25 MG/2ML IN SUSP
0.2500 mg | Freq: Two times a day (BID) | RESPIRATORY_TRACT | Status: DC
  Filled 2019-07-27 (×2): qty 2

## 2019-07-27 NOTE — Progress Notes (Signed)
Established hemodialysis patient known at [facility] MWF 1:00, patient transports self to treatments. States that he does not have any concerns about dialysis at this time.  Elvera Bicker Dialysis Coordinator [phone]

## 2019-07-27 NOTE — ED Notes (Signed)
This RN to bedside, introduced self to patient. Pt awakens easily with verbal stimuli. Pt is alert and oriented, bloodwork collected by this RN and sent to lab due to previous shift being unable to collect. Pt visualized in NAD, VS obtained by this RN. Will continue to monitor for further patient needs.

## 2019-07-27 NOTE — Progress Notes (Signed)
[facility] Discharge Summary   Patient: Jeremy [name] [id number]   PCP: Patient, No Pcp Per DOB: 08-22-1961   Date of admission: 07/26/2019   Date of discharge: 07/27/2019      Discharge Diagnoses:  Primary problem   Symptomatic anemia  Active Problems:   ESRD (end stage renal disease) ([place])   Neck pain   Melena   Hx of atrial fibrillation without current medication    Discharge Condition: unknown    History of present illness:  Jeremy [name] is a 58 y.o. M with ESRD on HD MWF, HTN, A. fib on amiodarone, no anticoagulation, sCHF EF 35-40% in Aug 2020 who presented for symptomatic anemia, generalized weakness and neck pain.  Has been taking aspirin and maybe Toradol PO (he is unsure) for neck pain.  He did note dark tarry stools, which seemed to improve when he stopped NSAIDs.  Finally had lab work at dialysis and was sent to ER.  In ER, Hgb 6.3.  Transfused 3 units.  Started on protonix.        Principal discharge diagnosis Symptomatic anemia    Hospital Course:    Symptomatic anemia Hgb 6.3 g/dL on admission.  Transfused 3 units overnight.  Hemoglobin up to 9.4 this morning, appropriate bump.  No further bleeding.   Melena Patient admitted and started on Protonix.  GI were consulted and recommended clears and EGD.  Patient refused EGD and left AMA.  The patient refused to wait long enough to talk to me about his plan and recommendations.  He left before I was able to examine him and discuss treatment options.  Neck pain Unknown cause.  Improvement with NSAIDs suggests degenerative spinal disease.  Atrial fibrillation, chronic Not on anticoagulation.  Chronic systolic CHF  ESRD  Neuropathy  Smoking          Patient left the hospital AMA.       Procedures and Results:  Underwent in hospital dialysis   Consultations:  Nephrology  Gastroenterology  The results of significant diagnostics from this hospitalization  (including imaging, microbiology, ancillary and laboratory) are listed below for reference.    Significant Diagnostic Studies: Dg Chest 2 View  Result Date: 07/02/2019 CLINICAL DATA:  Fluid retention EXAM: CHEST - 2 VIEW COMPARISON:  Radiograph May 09, 2019, CT February 09, 2019 FINDINGS: Increased hazy interstitial opacities throughout the lungs with fissural and septal thickening. Cephalized and indistinct pulmonary vascularity. Mild cardiomegaly though similar to comparison. Aorta is calcified. Degenerative changes are present in the imaged [name] and shoulders. No acute osseous abnormality. Vascular stent noted in the left axilla. Soft tissues are otherwise unremarkable.Surgical clips present in the right upper quadrant. IMPRESSION: Interstitial pulmonary edema and mild stable cardiomegaly. Aortic Atherosclerosis (ICD10-I70.0). Electronically Signed   By: Lovena Le M.D.   On: 07/02/2019 16:04   Jeremy [name] Wo Contrast  Result Date: 07/17/2019 CLINICAL DATA:  58 year old male with renal failure on dialysis. Persistent left side neck pain radiating to the shoulder and upper extremity. EXAM: MRI CERVICAL [name] WITHOUT CONTRAST TECHNIQUE: Multiplanar, multisequence Jeremy imaging of the cervical [name] was performed. No intravenous contrast was administered. COMPARISON:  Cervical [name] MRI 05/11/2019. FINDINGS: Alignment: Stable straightening of cervical lordosis. Vertebrae: Stable generalized decreased bone marrow signal throughout the [name]. Stable T2 and STIR hyperintensity within the central C4 vertebral body which appears to be a degenerative cyst/geode. Chronic degenerative endplate changes elsewhere. Degenerative appearing left C6-C7 facet joint fluid is new. No new marrow edema or acute osseous abnormality  identified. Cord: No cervical spinal cord signal abnormality despite multilevel cord mass effect detailed below. Posterior Fossa, vertebral arteries, paraspinal tissues: Mildly regressed but not  resolved abnormal prevertebral T2 and STIR signal which was described in September. Residual signal is mostly at the C4-C7 level now. And there is some associated longus coli muscle edema at the C6-C7 level on series 8, image 20. There is no convincing posterior ligamentous signal abnormality. No other paraspinal muscle edema identified. Stable major vascular flow voids in the neck, the right vertebral artery flow void is absent. Disc levels: C2-C3:  Stable without degeneration or stenosis. C3-C4: Disc osteophyte complex with broad-based posterior component and mild to moderate posterior element hypertrophy. Spinal stenosis with mild spinal cord mass effect. Moderate left and severe right C4 foraminal stenosis. This level appears stable. C4-C5: Disc space loss with circumferential disc osteophyte complex. Broad-based posterior component with ligament flavum hypertrophy which does appear increased since September. Mildly increased spinal stenosis and spinal cord mass effect. Moderate to severe left and severe right C5 foraminal stenosis appears stable. C5-C6: Circumferential disc osteophyte complex with ligament flavum hypertrophy which also appears increase in September. Spinal stenosis with mildly increased spinal cord mass effect. Moderate to severe bilateral C6 foraminal stenosis. C6-C7: Circumferential disc osteophyte complex with moderate to severe ligament flavum hypertrophy which has increased and greater on the left. Increased mild spinal stenosis with mild spinal cord mass effect. Moderate to severe bilateral C7 foraminal stenosis. C7-T1:  No degeneration or stenosis at this level. IMPRESSION: 1. Nonspecific prevertebral signal abnormality has decreased but not resolved since September. There is some associated edema in the ventral longus coli muscles at C6. However, there are no osseous, disc, or other paraspinal soft tissue changes to suggest a spinal infection. Therefore, soft tissue injury or less  likely a noninfectious inflammatory process (such as acute calcific prevertebral tendinitis) remains favored. 2. 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 Increased ligament flavum hypertrophy since September exacerbating spinal stenosis at those levels with mildly increased multilevel spinal cord mass effect. But no cord signal abnormality identified. 3. Associated moderate and severe C4 through C7 nerve level foraminal stenosis. Electronically Signed   By: Genevie Ann M.D.   On: 07/17/2019 18:59   Dg Chest Port 1 View  Result Date: 07/09/2019 CLINICAL DATA:  Left chest and shoulder pain for 2 months. End-stage renal disease on dialysis. EXAM: PORTABLE CHEST 1 VIEW COMPARISON:  07/02/2019 FINDINGS: Moderate to severe cardiomegaly and diffuse pulmonary vascular congestion remains stable. No evidence of frank pulmonary edema. No evidence of pulmonary consolidation or pleural effusion. Vascular stent again noted in left axillary region. IMPRESSION: Stable cardiomegaly and pulmonary vascular congestion. No acute findings. Electronically Signed   By: Marlaine Hind M.D.   On: 07/09/2019 15:14    Microbiology: Recent Results (from the past 240 hour(s))  SARS CORONAVIRUS 2 (TAT 6-24 HRS) Nasopharyngeal Nasopharyngeal Swab     Status: None   Collection Time: 07/26/19  8:47 PM   Specimen: Nasopharyngeal Swab  Result Value Ref Range Status   SARS Coronavirus 2 NEGATIVE NEGATIVE Final    Comment: (NOTE) SARS-CoV-2 target nucleic acids are NOT DETECTED. The SARS-CoV-2 RNA is generally detectable in upper and lower respiratory specimens during the acute phase of infection. Negative results do not preclude SARS-CoV-2 infection, do not rule out co-infections with other pathogens, and should not be used as the sole basis for treatment or other patient management decisions. Negative results must be combined with clinical observations, patient  history, and epidemiological information. The  expected result is Negative. Fact Sheet for Patients: SugarRoll.be Fact Sheet for Healthcare Providers: https://www.woods-mathews.com/ This test is not yet approved or cleared by the [place] FDA and  has been authorized for detection and/or diagnosis of SARS-CoV-2 by FDA under an Emergency Use Authorization (EUA). This EUA will remain  in effect (meaning this test can be used) for the duration of the COVID-19 declaration under Section 56 4(b)(1) of the Act, 21 U.S.C. section 360bbb-3(b)(1), unless the authorization is terminated or revoked sooner. Performed at [facility], [place] [address], [place], [place] [address]      Labs: CBC: Recent Labs  Lab 07/26/19 1757 07/27/19 0830  WBC 6.4 7.5  NEUTROABS  --  5.5  HGB 6.3* 9.4*  HCT 20.5* 29.3*  MCV 105.7* 98.3  PLT 364 0000000   Basic Metabolic Panel: Recent Labs  Lab 07/26/19 1757 07/27/19 0830  NA 140  --   K 4.9  --   CL 99  --   CO2 24  --   GLUCOSE 134*  --   BUN 54*  --   CREATININE 8.59*  --   CALCIUM 8.9  --   MG  --  2.6*  PHOS  --  10.1*    BNP (last 3 results) Recent Labs    11/23/18 1021 02/14/19 1342 04/29/19 1522  BNP 414.0* 4,218.0* 1,632.0*       Time spent: 15 minutes  Signed:  Suann Larry Marjorie Lussier  [facility] 07/27/2019, 3:34 PM

## 2019-07-27 NOTE — ED Notes (Signed)
Pt ambulatory to the lobby at this time, pt signed AMA papers, IV removed per policy. This RN reviewed with patient the risks of leaving AMA up to and including death. Pt visualized in NAD at this time as he is ambulatory from the lobby while eating graham crackers.

## 2019-07-27 NOTE — Progress Notes (Signed)
Pre HD Assessment    07/27/19 1000  Neurological  Level of Consciousness Alert  Orientation Level Oriented X4  Respiratory  Respiratory Pattern Regular;Unlabored  Chest Assessment Chest expansion symmetrical  Bilateral Breath Sounds Clear;Diminished  Cough None  Cardiac  Pulse Regular  Heart Sounds S1, S2  ECG Monitor  (Pt refused )  Vascular  R Radial Pulse +2  L Radial Pulse +2  Edema Generalized  Psychosocial  Psychosocial (WDL) WDL

## 2019-07-27 NOTE — Consult Note (Signed)
GI Inpatient Consult Note  Reason for Consult: Symptomatic anemia   Attending Requesting Consult: Dr. Myrene Buddy  History of Present Illness: Jeremy [name] is a 58 y.o. male seen for evaluation of symptomatic anemia at the request of Dr. Loleta Books. Patient has a PMH of ESRD on hemodialysis on MWF, HTN, CHF, and Hx of atrial fibrillation not on anticoagulation who presented to the ED after being told that his hemoglobin was low. Upon presentation to the ED, hemoglobin 6.3, MCV 106, ferritin 615, iron 41, iron sat 16%, reticulocyte count 6.1%, WBC 6.4. He is s/p blood transfusion. Patient reports that about one month ago he had a 1-week history of black, tarry stools with associated generalized weakness and fatigue. He reports during this time he was having significant left-sided neck pain. He was taking aspirin around the clock for the pain. He reports that all last week he was experiencing black, tarry stools. He reports he has never had this happen before. He denies any bright red bloody stools. He denies any nausea, vomiting, dysphagia, odynophagia, early satiety, or abdominal pain. He reports that he lives by himself and has only been leaving his house to go to hemodialysis on Mondays, Wednesdays, and Fridays. He does not take any acid suppression therapy at home. He denies any frequent heartburn or acid reflux symptoms. He reports he has never had an EGD or colonoscopy. He denies any known family history of esophageal, gastric, pancreatic, or colon cancer. He denies any unintentional weight loss.    Last Colonoscopy: N/A Last Endoscopy: N/A   Past Medical History:  Past Medical History:  Diagnosis Date  . Bronchitis   . Hypertension   . Renal disorder    kidney failure, dialysis    Problem List: Patient Active Problem List   Diagnosis Date Noted  . Symptomatic anemia 07/26/2019  . ESRD (end stage renal disease) ([place]) 07/26/2019  . Neck pain 07/26/2019  . Melena 07/26/2019  . Hx of  atrial fibrillation without current medication 07/26/2019  . Atrial fibrillation with rapid ventricular response ([place]) 04/30/2019  . Syncope 04/29/2019  . Acute CHF (congestive heart failure) ([place]) 02/14/2019  . Acute encephalopathy 11/23/2018  . Influenza B 11/21/2018  . Pulmonary edema 09/22/2018  . Hyperkalemia 05/16/2018    Past Surgical History: Past Surgical History:  Procedure Laterality Date  . DIALYSIS FISTULA CREATION      Allergies: No Known Allergies  Home Medications: Medications Prior to Admission  Medication Sig Dispense Refill Last Dose  . amiodarone (PACERONE) 200 MG tablet Take 1 tablet (200 mg total) by mouth 2 (two) times daily. 30 tablet 0 07/26/2019 at Unknown time  . amLODipine (NORVASC) 10 MG tablet Take 10 mg by mouth daily.   07/26/2019 at Unknown time  . Budesonide 90 MCG/ACT inhaler Inhale 1 puff into the lungs 2 (two) times daily.   07/26/2019 at Unknown time  . calcium acetate (PHOSLO) 667 MG capsule Take 667 mg by mouth 3 (three) times daily with meals.    07/26/2019 at Unknown time  . cinacalcet (SENSIPAR) 30 MG tablet Take 30 mg by mouth 3 (three) times a week.   Past Week at Unknown time  . fluticasone (FLONASE) 50 MCG/ACT nasal spray Place 2 sprays into both nostrils daily. 16 g 2 Past Week at prn  . gabapentin (NEURONTIN) 300 MG capsule Take 1 capsule (300 mg total) by mouth 2 (two) times daily. 60 capsule 1 07/26/2019 at Unknown time  . methocarbamol (ROBAXIN) 500 MG tablet Take 500  mg by mouth 3 (three) times daily.   Past Week at prn  . Multiple Vitamin (MULTIVITAMIN WITH MINERALS) TABS tablet Take 1 tablet by mouth daily.   07/26/2019 at Unknown time  . pantoprazole (PROTONIX) 40 MG tablet Take 1 tablet (40 mg total) by mouth daily. 30 tablet 0 07/26/2019 at Unknown time  . albuterol (PROVENTIL HFA;VENTOLIN HFA) 108 (90 Base) MCG/ACT inhaler Inhale 2 puffs into the lungs every 6 (six) hours as needed for wheezing or shortness of breath. 1 Inhaler 2  prn at prn  . butalbital-acetaminophen-caffeine (FIORICET) 50-325-40 MG tablet Take 1 tablet by mouth every 6 (six) hours as needed for headache. 10 tablet 0 prn at prn  . ketorolac (TORADOL) 10 MG tablet Take 1 tablet (10 mg total) by mouth every 8 (eight) hours. (Patient not taking: Reported on 07/27/2019) 15 tablet 0 Completed Course at Unknown time  . metoCLOPramide (REGLAN) 5 MG tablet Take 1 tablet (5 mg total) by mouth every 8 (eight) hours as needed for up to 14 days for nausea. 21 tablet 1   . nicotine polacrilex (NICORETTE) 4 MG gum Take 4 mg by mouth as directed.     Marland Kitchen oxyCODONE-acetaminophen (PERCOCET) 5-325 MG tablet Take 1 tablet by mouth 2 (two) times daily as needed. (Patient not taking: Reported on 07/27/2019) 8 tablet 0 Completed Course at Unknown time  . oxyCODONE-acetaminophen (PERCOCET) 5-325 MG tablet Take 1 tablet by mouth every 4 (four) hours as needed for severe pain. (Patient not taking: Reported on 07/27/2019) 4 tablet 0 Completed Course at Unknown time   Home medication reconciliation was completed with the patient.   Scheduled Inpatient Medications:   . amiodarone  200 mg Oral BID  . budesonide (PULMICORT) nebulizer solution  0.25 mg Nebulization BID  . calcium acetate  667 mg Oral TID WC  . chlorhexidine  6 application Topical 99991111  . cinacalcet  30 mg Oral 3 times weekly  . epoetin (EPOGEN/PROCRIT) injection  10,000 Units Intravenous Q M,W,F-HD  . gabapentin  300 mg Oral BID  . nicotine  21 mg Transdermal Daily  . pantoprazole (PROTONIX) IV  40 mg Intravenous Q24H  . sodium chloride flush  3 mL Intravenous Q12H    Continuous Inpatient Infusions:   . sodium chloride    . sodium chloride Stopped (07/27/19 0839)    PRN Inpatient Medications:  sodium chloride, acetaminophen **OR** acetaminophen, albuterol, HYDROcodone-acetaminophen, metoCLOPramide, morphine injection, ondansetron **OR** ondansetron (ZOFRAN) IV, oxyCODONE-acetaminophen, sodium chloride  flush  Family History: family history includes Hypertension in his mother.  The patient's family history is negative for inflammatory bowel disorders, GI malignancy, or solid organ transplantation.  Social History:   reports that he has been smoking cigarettes. He has been smoking about 0.50 packs per day. He has never used smokeless tobacco. He reports current alcohol use. He reports current drug use. Drug: Marijuana. The patient denies ETOH, tobacco, or drug use.   Review of Systems: Constitutional: Weight is stable.  Eyes: No changes in vision. ENT: No oral lesions, sore throat.  GI: see HPI.  Heme/Lymph: No easy bruising.  CV: No chest pain.  GU: No hematuria.  Integumentary: No rashes.  Neuro: No headaches.  Psych: No depression/anxiety.  Endocrine: No heat/cold intolerance.  Allergic/Immunologic: No urticaria.  Resp: No cough, SOB.  Musculoskeletal: No joint swelling.    Physical Examination: BP (!) 166/90 (BP Location: Right Arm)   Pulse 72   Temp 98 F (36.7 C) (Oral)   Resp (!) 22  Ht 5\' 6"  (1.676 m)   Wt 65.8 kg   SpO2 99%   BMI 23.40 kg/m  Somnolent, but arouseable. Does not open eyes to answer questions. Seems irritated. Gen: NAD, alert and oriented x 4 HEENT: PEERLA, EOMI, Neck: supple, no JVD or thyromegaly Chest: CTA bilaterally, no wheezes, crackles, or other adventitious sounds CV: RRR, no m/g/c/r Abd: soft, NT, ND, +BS in all four quadrants; no HSM, guarding, ridigity, or rebound tenderness Ext: no edema, well perfused with 2+ pulses, Skin: no rash or lesions noted Lymph: no LAD  Data: Lab Results  Component Value Date   WBC 7.5 07/27/2019   HGB 9.4 (L) 07/27/2019   HCT 29.3 (L) 07/27/2019   MCV 98.3 07/27/2019   PLT 324 07/27/2019   Recent Labs  Lab 07/26/19 1757 07/27/19 0830  HGB 6.3* 9.4*   Lab Results  Component Value Date   NA 140 07/26/2019   K 4.9 07/26/2019   CL 99 07/26/2019   CO2 24 07/26/2019   BUN 54 (H) 07/26/2019    CREATININE 8.59 (H) 07/26/2019   Lab Results  Component Value Date   ALT 13 07/02/2019   AST 14 (L) 07/02/2019   ALKPHOS 153 (H) 07/02/2019   BILITOT 0.5 07/02/2019   No results for input(s): APTT, INR, PTT in the last 168 hours. Assessment/Plan:  58 y/o AA male with a PMH of ESRD on hemodialysis on MWF, HTN, CHF, and Hx of atrial fibrillation not on anticoagulation presented to the ED for symptomatic anemia  1. Symptomatic anemia, acute blood loss anemia 2. Overuse of NSAIDs 3. Melena  4. ESRD on HD MWF  -Likely upper GI bleeding in the setting of overuse of NSAIDs. DDx includes esophagitis, peptic ulcer disease, gastritis +/- H pylori, AVMs, duodenitis, malignancy, Dieulafoy's lesion -He is s/p blood transfusion. Hgb on admission 6.3. Currently 9.4. -Continue to monitor serial H&H. Transfusion for Hgb <7.0. -Agree with acid suppression -I recommended EGD for further evaluation of his acute blood loss anemia and melena. I discussed with patient his symptoms are most likely due to UGI bleeding. We discussed recommendation to have EGD for both diagnostic and potentially therapeutic reasons. We discussed procedure details, indications, and potential complications, including bleeding, infection, small puncture to internal organs, or problems with anesthesia. Patient verbally refuses procedures. He understands risks of not going forward with procedure. -Keep on clear liquid diet and monitor symptoms -Please call us immediately if there are further episodes of bleeding -GI will continue to follow peripherally for any acute changes -Can check H pylori stool antigen -He should remain on PPI BID for at least the next 6 weeks then can decrease to daily for another two months. He can follow-up as outpatient if he wishes.    Thank you for the consult. Please call with questions or concerns.  Reeves Forth [facility] [phone] (Cell)

## 2019-07-27 NOTE — ED Notes (Signed)
Pt transported to dialysis by Roselyn Reef, Transporter. Dialysis RN Carolee Rota made aware pt en route.

## 2019-07-27 NOTE — Progress Notes (Signed)
[facility], [place] 07/27/19  Subjective:   LOS: 0 11/17 0701 - 11/18 0700 In: 1310 [I.V.:250; Blood:1060] Out: -   Patient known to our practice from outpatient dialysis His outpatient hemoglobin was noted to be low at 5.9 on November 16 therefore he was referred to the ER for evaluation.  He has so far received 6 units of blood transfusion.  It is thought to be secondary to excessive use of nonsteroidals for his neck pain.  Currently seen during HD, tolerating well Denies any acute shortness of breath     Objective:  Vital signs in last 24 hours:  Temp:  [97.8 F (36.6 C)-98.3 F (36.8 C)] 98 F (36.7 C) (11/18 0829) Pulse Rate:  [72-86] 72 (11/18 0829) Resp:  [16-22] 22 (11/18 0829) BP: (149-169)/(66-95) 166/90 (11/18 0829) SpO2:  [89 %-100 %] 99 % (11/18 0829) Weight:  [65.8 kg] 65.8 kg (11/17 1746)  Weight change:  Filed Weights   07/26/19 1746  Weight: 65.8 kg    Intake/Output:    Intake/Output Summary (Last 24 hours) at 07/27/2019 1147 Last data filed at 07/27/2019 0342 Gross per 24 hour  Intake 1310 ml  Output -  Net 1310 ml     Physical Exam: General:  No acute distress, laying in the bed  HEENT  Anicteric, moist oral mucous membranes  Pulm/lungs  coarse breath sounds  CVS/Heart  regular rhythm, no rub  Abdomen:   Soft, nontender  Extremities:  No edema  Neurologic:  Alert, oriented,  Skin:  No acute rashes  Access:  Left arm AV fistula       Basic Metabolic Panel:  Recent Labs  Lab 07/26/19 1757 07/27/19 0830  NA 140  --   K 4.9  --   CL 99  --   CO2 24  --   GLUCOSE 134*  --   BUN 54*  --   CREATININE 8.59*  --   CALCIUM 8.9  --   MG  --  2.6*  PHOS  --  10.1*     CBC: Recent Labs  Lab 07/26/19 1757 07/27/19 0830  WBC 6.4 7.5  NEUTROABS  --  5.5  HGB 6.3* 9.4*  HCT 20.5* 29.3*  MCV 105.7* 98.3  PLT 364 324      Lab Results  Component Value Date   HEPBSAG Negative 05/18/2018   HEPBSAB Reactive 05/18/2018      Microbiology:  Recent Results (from the past 240 hour(s))  SARS CORONAVIRUS 2 (TAT 6-24 HRS) Nasopharyngeal Nasopharyngeal Swab     Status: None   Collection Time: 07/26/19  8:47 PM   Specimen: Nasopharyngeal Swab  Result Value Ref Range Status   SARS Coronavirus 2 NEGATIVE NEGATIVE Final    Comment: (NOTE) SARS-CoV-2 target nucleic acids are NOT DETECTED. The SARS-CoV-2 RNA is generally detectable in upper and lower respiratory specimens during the acute phase of infection. Negative results do not preclude SARS-CoV-2 infection, do not rule out co-infections with other pathogens, and should not be used as the sole basis for treatment or other patient management decisions. Negative results must be combined with clinical observations, patient history, and epidemiological information. The expected result is Negative. Fact Sheet for Patients: SugarRoll.be Fact Sheet for Healthcare Providers: https://www.woods-mathews.com/ This test is not yet approved or cleared by the [place] FDA and  has been authorized for detection and/or diagnosis of SARS-CoV-2 by FDA under an Emergency Use Authorization (EUA). This EUA will remain  in effect (meaning this  test can be used) for the duration of the COVID-19 declaration under Section 56 4(b)(1) of the Act, 21 U.S.C. section 360bbb-3(b)(1), unless the authorization is terminated or revoked sooner. Performed at [facility], [place] [address], [place],  [address]     Coagulation Studies: No results for input(s): LABPROT, INR in the last 72 hours.  Urinalysis: No results for input(s): COLORURINE, LABSPEC, PHURINE, GLUCOSEU, HGBUR, BILIRUBINUR, KETONESUR, PROTEINUR, UROBILINOGEN, NITRITE, LEUKOCYTESUR in the last 72 hours.  Invalid input(s): APPERANCEUR    Imaging: No results found.   Medications:   . sodium chloride    . sodium chloride Stopped  (07/27/19 0839)   . amiodarone  200 mg Oral BID  . budesonide (PULMICORT) nebulizer solution  0.25 mg Nebulization BID  . calcium acetate  667 mg Oral TID WC  . chlorhexidine  6 application Topical 99991111  . cinacalcet  30 mg Oral 3 times weekly  . gabapentin  300 mg Oral BID  . nicotine  21 mg Transdermal Daily  . pantoprazole (PROTONIX) IV  40 mg Intravenous Q24H  . sodium chloride flush  3 mL Intravenous Q12H   sodium chloride, acetaminophen **OR** acetaminophen, albuterol, HYDROcodone-acetaminophen, metoCLOPramide, morphine injection, ondansetron **OR** ondansetron (ZOFRAN) IV, oxyCODONE-acetaminophen, sodium chloride flush  Assessment/ Plan:  58 y.o. African-American male with end stage renal disease on hemodialysis, hypertension, congestive heart failure, diabetes mellitus type II, history of substance abuse, COPD with ongoing tobacco use   Active Problems:   Symptomatic anemia   ESRD (end stage renal disease) ([facility])   Neck pain   Melena   Hx of atrial fibrillation without current medication   #. Anemia of CKD and blood loss from GI bleed Patient reports dark stools S/p 3 units blood transfusion in the ER  Lab Results  Component Value Date   HGB 9.4 (L) 07/27/2019  Continue EPO with HD  #. SHPTH     Component Value Date/Time   PTH 708 (H) 11/24/2018 0607   Lab Results  Component Value Date   PHOS 10.1 (H) 07/27/2019   Monitor calcium and phos level during this admission Encouraged compliance with binders and dialysis treaments Resume home dose of phoslo and renvela once able to eat a normal diet  #. Diabetes type 2 with CKD Hgb A1c MFr Bld (%)  Date Value  05/02/2019 7.6 (H)  diet controlled   #. ESRD [facility] Dialysis MWF-3//TW// 63.5kg Seen during dialysis.  Tolerating well.   LOS: 0 Genae Strine Candiss Norse 11/18/202011:47 AM  [facility] [place], [place]

## 2019-07-27 NOTE — ED Notes (Addendum)
Pt returned from dialysis at this time, pt requesting all IV's be pulled and pt states he wants to leave AMA, admitting MD made aware at this time via secure chat.

## 2019-07-27 NOTE — Progress Notes (Signed)
HD Tx started w/o complication   [id number]  Hand-Off documentation  Report given to (Full Name) Beatris Ship, RN  Report received from (Full Name) Merleen Nicely, RN   Vital Signs  Temp 98 F (36.7 C)  Temp Source Oral  Pulse Rate 70  Pulse Rate Source Monitor  Resp 20  BP (!) 148/92  BP Location Right Arm  BP Method Automatic  Patient Position (if appropriate) Lying  Oxygen Therapy  SpO2 99 %  O2 Device Nasal Cannula  O2 Flow Rate (L/min) 2 L/min  Pain Assessment  Pain Scale 0-10  Pain Score 0  Dialysis Weight  Weight 65.8 kg  Type of Weight Pre-Dialysis  Time-Out for Hemodialysis  What Procedure? HD  Pt Identifiers(min of two) First/Last Name;MRN/Account#  Correct Site? Yes  Correct Side? Yes  Correct Procedure? Yes  Consents Verified? Yes  Rad Studies Available? N/A  Safety Precautions Reviewed? Yes  Engineer, civil (consulting) Number 7  Station Number 1  UF/Alarm Test Passed  Conductivity: Meter 14  Conductivity: Machine  14.1  pH 7.2  Reverse Osmosis Main  Normal Saline Lot Number [id number]  Dialyzer Lot Number [id number]  Disposable Set Lot Number [id number]  Machine Temperature 98.6 F (37 C)  Musician and Audible Yes  Blood Lines Intact and Secured Yes  Pre Treatment Patient Checks  Vascular access used during treatment Fistula  Hepatitis B Surface Antigen Results Negative  Date Hepatitis B Surface Antigen Drawn 07/04/19  Hepatitis B Surface Antibody  (<10)  Date Hepatitis B Surface Antibody Drawn 07/04/19  Hemodialysis Consent Verified Yes  Hemodialysis Standing Orders Initiated Yes  ECG (Telemetry) Monitor On Yes  Prime Ordered Normal Saline  Length of  DialysisTreatment -hour(s) 3.5 Hour(s)  Dialysis Treatment Comments Na 140  Dialyzer Elisio 17H NR  Dialysate 3K;2.5 Ca  Dialysis Anticoagulant None  Dialysate Flow Ordered 800  Blood Flow Rate Ordered 400 mL/min  Ultrafiltration Goal 2 Liters  Dialysis Blood Pressure Support Ordered Normal  Saline  During Hemodialysis Assessment  Blood Flow Rate (mL/min) 400 mL/min  Arterial Pressure (mmHg) -110 mmHg  Venous Pressure (mmHg) 160 mmHg  Transmembrane Pressure (mmHg) 60 mmHg  Ultrafiltration Rate (mL/min) 710 mL/min  Dialysate Flow Rate (mL/min) 800 ml/min  Conductivity: Machine  14  HD Safety Checks Performed Yes  Dialysis Fluid Bolus Normal Saline  Bolus Amount (mL) 250 mL  Intra-Hemodialysis Comments Tx initiated  Education / Care Plan  Dialysis Education Provided Yes  Documented Education in Care Plan Yes

## 2019-07-28 LAB — BPAM RBC
Blood Product Expiration Date: 202012062359
Blood Product Expiration Date: 202012212359
Blood Product Expiration Date: 202012212359
ISSUE DATE / TIME: 202011172148
ISSUE DATE / TIME: 202011180002
ISSUE DATE / TIME: 202011180229
Unit Type and Rh: 5100
Unit Type and Rh: 5100
Unit Type and Rh: 5100

## 2019-07-28 LAB — TYPE AND SCREEN
ABO/RH(D): O POS
Antibody Screen: NEGATIVE
Unit division: 0
Unit division: 0
Unit division: 0

## 2019-09-15 ENCOUNTER — Other Ambulatory Visit: Payer: Self-pay

## 2019-09-15 ENCOUNTER — Encounter: Payer: Self-pay | Admitting: Emergency Medicine

## 2019-09-15 ENCOUNTER — Emergency Department
Admission: EM | Admit: 2019-09-15 | Discharge: 2019-09-15 | Disposition: A | Discharge status: Home / Self Care | Payer: Medicare Other | Attending: Student in an Organized Health Care Education/Training Program | Admitting: Student in an Organized Health Care Education/Training Program

## 2019-09-15 DIAGNOSIS — I509 Heart failure, unspecified: Secondary | ICD-10-CM | POA: Diagnosis not present

## 2019-09-15 DIAGNOSIS — Z992 Dependence on renal dialysis: Secondary | ICD-10-CM | POA: Insufficient documentation

## 2019-09-15 DIAGNOSIS — I132 Hypertensive heart and chronic kidney disease with heart failure and with stage 5 chronic kidney disease, or end stage renal disease: Secondary | ICD-10-CM | POA: Diagnosis not present

## 2019-09-15 DIAGNOSIS — Z79899 Other long term (current) drug therapy: Secondary | ICD-10-CM | POA: Insufficient documentation

## 2019-09-15 DIAGNOSIS — F1210 Cannabis abuse, uncomplicated: Secondary | ICD-10-CM | POA: Diagnosis not present

## 2019-09-15 DIAGNOSIS — M542 Cervicalgia: Secondary | ICD-10-CM | POA: Diagnosis not present

## 2019-09-15 DIAGNOSIS — M549 Dorsalgia, unspecified: Secondary | ICD-10-CM | POA: Diagnosis present

## 2019-09-15 DIAGNOSIS — F17210 Nicotine dependence, cigarettes, uncomplicated: Secondary | ICD-10-CM | POA: Diagnosis not present

## 2019-09-15 DIAGNOSIS — N186 End stage renal disease: Secondary | ICD-10-CM | POA: Insufficient documentation

## 2019-09-15 MED ORDER — OXYCODONE-ACETAMINOPHEN 7.5-325 MG PO TABS: 1.0000 | ORAL_TABLET | Freq: Four times a day (QID) | ORAL | 0 refills | Status: DC | PRN

## 2019-09-15 MED ORDER — HYDROMORPHONE HCL 1 MG/ML IJ SOLN
1.0000 mg | Freq: Once | INTRAMUSCULAR | Status: AC
  Administered 2019-09-15: 1 mg via INTRAMUSCULAR
  Filled 2019-09-15: qty 1

## 2019-09-15 NOTE — ED Provider Notes (Signed)
[facility] Emergency Department Provider Note   ____________________________________________   First MD Initiated Contact with Patient 09/15/19 1437     (approximate)  I have reviewed the triage vital signs and the nursing notes.   HISTORY  Chief Complaint Back Pain    HPI Jeremy [name] is a 59 y.o. male patient presents for acute neck pain with radicular component to the upper extremities.  Patient has history of acute degenerative changes cervical spine.  Patient scheduled for surgery on 09/23/2019.  Patient states neurosurgeon cannot prescribe pain medication until after his procedure.  Patient last prescription for narcotics was on 07/31/2019.  Patient rates his pain as a 9/10.  Patient described the pain as "sharp/achy".         Past Medical History:  Diagnosis Date  . Bronchitis   . Hypertension   . Renal disorder    kidney failure, dialysis    Patient Active Problem List   Diagnosis Date Noted  . Symptomatic anemia 07/26/2019  . ESRD (end stage renal disease) ([place]) 07/26/2019  . Neck pain 07/26/2019  . Melena 07/26/2019  . Hx of atrial fibrillation without current medication 07/26/2019  . Atrial fibrillation with rapid ventricular response ([place]) 04/30/2019  . Syncope 04/29/2019  . Acute CHF (congestive heart failure) ([place]) 02/14/2019  . Acute encephalopathy 11/23/2018  . Influenza B 11/21/2018  . Pulmonary edema 09/22/2018  . Hyperkalemia 05/16/2018    Past Surgical History:  Procedure Laterality Date  . DIALYSIS FISTULA CREATION      Prior to Admission medications   Medication Sig Start Date End Date Taking? Authorizing Provider  albuterol (PROVENTIL HFA;VENTOLIN HFA) 108 (90 Base) MCG/ACT inhaler Inhale 2 puffs into the lungs every 6 (six) hours as needed for wheezing or shortness of breath. 11/19/18   Nena Polio, MD  amiodarone (PACERONE) 200 MG tablet Take 1 tablet (200 mg total) by mouth 2 (two) times daily. 05/13/19    Epifanio Lesches, MD  amLODipine (NORVASC) 10 MG tablet Take 10 mg by mouth daily. 05/25/19   [provider]  Budesonide 90 MCG/ACT inhaler Inhale 1 puff into the lungs 2 (two) times daily. 04/23/19 04/22/20  [provider]  butalbital-acetaminophen-caffeine (FIORICET) 50-325-40 MG tablet Take 1 tablet by mouth every 6 (six) hours as needed for headache. 05/20/19 05/19/20  Lilia Pro., MD  calcium acetate (PHOSLO) 667 MG capsule Take 667 mg by mouth 3 (three) times daily with meals.     [provider]  cinacalcet (SENSIPAR) 30 MG tablet Take 30 mg by mouth 3 (three) times a week. 09/04/15   [provider]  fluticasone (FLONASE) 50 MCG/ACT nasal spray Place 2 sprays into both nostrils daily. 04/05/19   Dustin Flock, MD  gabapentin (NEURONTIN) 300 MG capsule Take 1 capsule (300 mg total) by mouth 2 (two) times daily. 06/26/19 08/25/19  Menshew, Dannielle Karvonen, PA-C  Multiple Vitamin (MULTIVITAMIN WITH MINERALS) TABS tablet Take 1 tablet by mouth daily.    [provider]  oxyCODONE-acetaminophen (PERCOCET) 7.5-325 MG tablet Take 1 tablet by mouth every 6 (six) hours as needed. 09/15/19   Sable Feil, PA-C  pantoprazole (PROTONIX) 40 MG tablet Take 1 tablet (40 mg total) by mouth daily. 04/05/19   Dustin Flock, MD    Allergies Patient has no known allergies.  Family History  Problem Relation Age of Onset  . Hypertension Mother     Social History Social History   Tobacco Use  . Smoking status: Current  Every Day Smoker    Packs/day: 0.50    Types: Cigarettes  . Smokeless tobacco: Never Used  Substance Use Topics  . Alcohol use: Yes    Comment: rare  . Drug use: Yes    Types: Marijuana    Review of Systems  Constitutional: No fever/chills Eyes: No visual changes. ENT: No sore throat. Cardiovascular: Denies chest pain. Respiratory: Denies shortness of breath. Gastrointestinal: No abdominal pain.  No nausea, no vomiting.  No  diarrhea.  No constipation. Genitourinary: Negative for dysuria. Musculoskeletal: Acute cervical pain Skin: Negative for rash. Neurological: Negative for headaches, focal weakness or numbness. Endocrine:  Hypertension  ____________________________________________   PHYSICAL EXAM:  VITAL SIGNS: ED Triage Vitals  Enc Vitals Group     BP 09/15/19 1431 125/80     Pulse Rate 09/15/19 1431 86     Resp 09/15/19 1431 16     Temp 09/15/19 1431 99.1 F (37.3 C)     Temp Source 09/15/19 1431 Oral     SpO2 09/15/19 1431 98 %     Weight 09/15/19 1432 145 lb 1 oz (65.8 kg)     Height 09/15/19 1432 5\' 6"  (1.676 m)     Head Circumference --      Peak Flow --      Pain Score 09/15/19 1431 9     Pain Loc --      Pain Edu? --      Excl. in [place]? --     Constitutional: Alert and oriented.  Moderate distress.   Neck: cervical spine tenderness to palpation C4-C7. Cardiovascular: Normal rate, regular rhythm. Grossly normal heart sounds.  Good peripheral circulation. Respiratory: Normal respiratory effort.  No retractions. Lungs CTAB. Gastrointestinal: Soft and nontender. No distention. No abdominal bruits. No CVA tenderness. Musculoskeletal: No lower extremity tenderness nor edema.  No joint effusions. Neurologic:  Normal speech and language. No gross focal neurologic deficits are appreciated. No gait instability. Skin:  Skin is warm, dry and intact. No rash noted. Psychiatric: Mood and affect are normal. Speech and behavior are normal.  ____________________________________________   LABS (all labs ordered are listed, but only abnormal results are displayed)  Labs Reviewed - No data to display ____________________________________________  EKG   ____________________________________________  RADIOLOGY  ED MD interpretation:    Official radiology report(s): No results found.  ____________________________________________   PROCEDURES  Procedure(s) performed (including Critical  Care):  Procedures   ____________________________________________   INITIAL IMPRESSION / ASSESSMENT AND PLAN / ED COURSE  As part of my medical decision making, I reviewed the following data within the [place]     Patient presents with radicular neck pain secondary to degenerative changes of the cervical spine.  Patient is scheduled for surgery on 09/23/2019.  Patient advised take medication as directed and follow-up with his schedule appointment.    Jeremy [name] was evaluated in Emergency Department on 09/15/2019 for the symptoms described in the history of present illness. He was evaluated in the context of the global COVID-19 pandemic, which necessitated consideration that the patient might be at risk for infection with the SARS-CoV-2 virus that causes COVID-19. Institutional protocols and algorithms that pertain to the evaluation of patients at risk for COVID-19 are in a state of rapid change based on information released by regulatory bodies including the CDC and federal and state organizations. These policies and algorithms were followed during the patient's care in the ED.       ____________________________________________   FINAL CLINICAL IMPRESSION(S) /  ED DIAGNOSES  Final diagnoses:  Acute neck pain     ED Discharge Orders         Ordered    oxyCODONE-acetaminophen (PERCOCET) 7.5-325 MG tablet  Every 6 hours PRN     09/15/19 1507           Note:  This document was prepared using Dragon voice recognition software and may include unintentional dictation errors.    Sable Feil, PA-C 09/15/19 1514    Duffy Bruce, MD 09/16/19 [phone]

## 2019-09-15 NOTE — Discharge Instructions (Addendum)
Take medication as directed follow-up with your treating doctor.

## 2019-09-15 NOTE — ED Notes (Signed)
See triage note  Presents with increased pain  States he is to have surgery next week by Dr Lacinda Axon  But having increased pain

## 2019-09-15 NOTE — ED Triage Notes (Signed)
C/O neck, bilateral arm and shoulder pain x 3 weeks.  Patient is scheduled for back surgery with Dr. Lacinda Axon 09/23/2019.  AAOx3.  Skin warm and dry. NAD

## 2019-09-19 ENCOUNTER — Emergency Department
Admission: EM | Admit: 2019-09-19 | Discharge: 2019-09-19 | Disposition: A | Discharge status: Home / Self Care | Payer: Medicare Other | Attending: Emergency Medicine | Admitting: Emergency Medicine

## 2019-09-19 ENCOUNTER — Other Ambulatory Visit: Payer: Self-pay

## 2019-09-19 ENCOUNTER — Encounter: Payer: Self-pay | Admitting: Emergency Medicine

## 2019-09-19 DIAGNOSIS — G8929 Other chronic pain: Secondary | ICD-10-CM | POA: Insufficient documentation

## 2019-09-19 DIAGNOSIS — M546 Pain in thoracic spine: Secondary | ICD-10-CM | POA: Diagnosis not present

## 2019-09-19 DIAGNOSIS — Z992 Dependence on renal dialysis: Secondary | ICD-10-CM | POA: Insufficient documentation

## 2019-09-19 DIAGNOSIS — M542 Cervicalgia: Secondary | ICD-10-CM | POA: Diagnosis not present

## 2019-09-19 DIAGNOSIS — I120 Hypertensive chronic kidney disease with stage 5 chronic kidney disease or end stage renal disease: Secondary | ICD-10-CM | POA: Insufficient documentation

## 2019-09-19 DIAGNOSIS — F17210 Nicotine dependence, cigarettes, uncomplicated: Secondary | ICD-10-CM | POA: Diagnosis not present

## 2019-09-19 DIAGNOSIS — Z79899 Other long term (current) drug therapy: Secondary | ICD-10-CM | POA: Diagnosis not present

## 2019-09-19 DIAGNOSIS — N186 End stage renal disease: Secondary | ICD-10-CM | POA: Diagnosis not present

## 2019-09-19 MED ORDER — HYDROMORPHONE HCL 1 MG/ML IJ SOLN
1.0000 mg | Freq: Once | INTRAMUSCULAR | Status: AC
  Administered 2019-09-19: 13:00:00 1 mg via INTRAMUSCULAR
  Filled 2019-09-19: qty 1

## 2019-09-19 NOTE — Discharge Instructions (Addendum)
Advised patient we do not provide chronic pain medication.  Patient is advised to see his treating family doctor or neurosurgeon to request medication until his surgery.  Advised patient cannot come to the emergency room daily for morphine injections.

## 2019-09-19 NOTE — ED Notes (Signed)
See triage note  Presents with pain to neck and upper back   Is scheduled for surgery this week   States having increased pain today while watching TV

## 2019-09-19 NOTE — ED Triage Notes (Signed)
Pt to ED via POV c/o back in his back, neck, and bilateral shoulder pain. Pt states that he had a spinal cord injury 3 months ago, is supposed to have surgery 1/15. Pt states that he was watching TV today and his pain increased. Pt states that he called his neurosurgeon, Dr. Lacinda Axon, who recommended that he come to the ED. Pt A &O, in NAD.

## 2019-09-19 NOTE — ED Provider Notes (Signed)
[facility] Emergency Department Provider Note   ____________________________________________   First MD Initiated Contact with Patient 09/19/19 1244     (approximate)  I have reviewed the triage vital signs and the nursing notes.   HISTORY  Chief Complaint Back Pain, Neck Pain, and Shoulder Pain    HPI Jeremy [name] is a 59 y.o. male patient presents with chronic neck and upper back pain secondary degenerative changes.  Patient scheduled surgery on 09/23/2019.  Patient state neurosurgeon would not prescribe any more pain medication until after surgical procedure.  Patient rates his pain as a 10/10.  Patient described pain as "sharp".  Patient was seen at this location 4 days ago and prescribed 2 days of narcotic pain medication by this provider.  Voice hesitancy of given patient injection today because he lied about having transportation on his last visit when he got a narcotic pain shot.  Patient state his brother is in the parking lot.  We will get verification before given injection.     Past Medical History:  Diagnosis Date  . Bronchitis   . Hypertension   . Renal disorder    kidney failure, dialysis    Patient Active Problem List   Diagnosis Date Noted  . Symptomatic anemia 07/26/2019  . ESRD (end stage renal disease) ([place]) 07/26/2019  . Neck pain 07/26/2019  . Melena 07/26/2019  . Hx of atrial fibrillation without current medication 07/26/2019  . Atrial fibrillation with rapid ventricular response ([place]) 04/30/2019  . Syncope 04/29/2019  . Acute CHF (congestive heart failure) ([place]) 02/14/2019  . Acute encephalopathy 11/23/2018  . Influenza B 11/21/2018  . Pulmonary edema 09/22/2018  . Hyperkalemia 05/16/2018    Past Surgical History:  Procedure Laterality Date  . DIALYSIS FISTULA CREATION      Prior to Admission medications   Medication Sig Start Date End Date Taking? Authorizing Provider  albuterol (PROVENTIL HFA;VENTOLIN HFA) 108 (90  Base) MCG/ACT inhaler Inhale 2 puffs into the lungs every 6 (six) hours as needed for wheezing or shortness of breath. 11/19/18   Nena Polio, MD  amiodarone (PACERONE) 200 MG tablet Take 1 tablet (200 mg total) by mouth 2 (two) times daily. 05/13/19   Epifanio Lesches, MD  amLODipine (NORVASC) 10 MG tablet Take 10 mg by mouth daily. 05/25/19   [provider]  Budesonide 90 MCG/ACT inhaler Inhale 1 puff into the lungs 2 (two) times daily. 04/23/19 04/22/20  [provider]  butalbital-acetaminophen-caffeine (FIORICET) 50-325-40 MG tablet Take 1 tablet by mouth every 6 (six) hours as needed for headache. 05/20/19 05/19/20  Lilia Pro., MD  calcium acetate (PHOSLO) 667 MG capsule Take 667 mg by mouth 3 (three) times daily with meals.     [provider]  cinacalcet (SENSIPAR) 30 MG tablet Take 30 mg by mouth 3 (three) times a week. 09/04/15   [provider]  fluticasone (FLONASE) 50 MCG/ACT nasal spray Place 2 sprays into both nostrils daily. 04/05/19   Dustin Flock, MD  gabapentin (NEURONTIN) 300 MG capsule Take 1 capsule (300 mg total) by mouth 2 (two) times daily. 06/26/19 08/25/19  Menshew, Dannielle Karvonen, PA-C  Multiple Vitamin (MULTIVITAMIN WITH MINERALS) TABS tablet Take 1 tablet by mouth daily.    [provider]  oxyCODONE-acetaminophen (PERCOCET) 7.5-325 MG tablet Take 1 tablet by mouth every 6 (six) hours as needed. 09/15/19   Sable Feil, PA-C  pantoprazole (PROTONIX) 40 MG tablet Take 1 tablet (40 mg total) by mouth  daily. 04/05/19   Dustin Flock, MD    Allergies Patient has no known allergies.  Family History  Problem Relation Age of Onset  . Hypertension Mother     Social History Social History   Tobacco Use  . Smoking status: Current Every Day Smoker    Packs/day: 0.50    Types: Cigarettes  . Smokeless tobacco: Never Used  Substance Use Topics  . Alcohol use: Yes    Comment: rare  . Drug use: Yes    Types:  Marijuana    Review of Systems Constitutional: No fever/chills Eyes: No visual changes. ENT: No sore throat. Cardiovascular: Denies chest pain. Respiratory: Denies shortness of breath. Gastrointestinal: No abdominal pain.  No nausea, no vomiting.  No diarrhea.  No constipation. Genitourinary: Negative for dysuria. Musculoskeletal: Chronic neck and back pain.   Skin: Negative for rash. Neurological: Negative for headaches, focal weakness or numbness. ____________________________________________   PHYSICAL EXAM:  VITAL SIGNS: ED Triage Vitals  Enc Vitals Group     BP 09/19/19 1226 (!) 141/75     Pulse Rate 09/19/19 1226 75     Resp 09/19/19 1226 16     Temp 09/19/19 1226 97.6 F (36.4 C)     Temp Source 09/19/19 1226 Oral     SpO2 09/19/19 1226 93 %     Weight 09/19/19 1225 151 lb (68.5 kg)     Height 09/19/19 1225 5\' 6"  (1.676 m)     Head Circumference --      Peak Flow --      Pain Score 09/19/19 1224 10     Pain Loc --      Pain Edu? --      Excl. in [place]? --    Constitutional: Alert and oriented. Well appearing and in no acute distress. Neck: No stridor.  Cervical spine tenderness to palpation C4-C6. Cardiovascular: Normal rate, regular rhythm. Grossly normal heart sounds.  Good peripheral circulation. Respiratory: Normal respiratory effort.  No retractions. Lungs CTAB. Neurologic:  Normal speech and language. No gross focal neurologic deficits are appreciated. No gait instability. Skin:  Skin is warm, dry and intact. No rash noted. Psychiatric: Mood and affect are normal. Speech and behavior are normal.  ____________________________________________   LABS (all labs ordered are listed, but only abnormal results are displayed)  Labs Reviewed - No data to display ____________________________________________  EKG   ____________________________________________  RADIOLOGY  ED MD interpretation:    Official radiology report(s): No results  found.  ____________________________________________   PROCEDURES  Procedure(s) performed (including Critical Care):  Procedures   ____________________________________________   INITIAL IMPRESSION / ASSESSMENT AND PLAN / ED COURSE  As part of my medical decision making, I reviewed the following data within the [place]     Patient presents for medication for chronic neck and back pain.  Advised patient ER cannot prescribe medication for chronic pain.  Patient advised to contact either family doctor or neurosurgeon to discuss narcotic pain medication request.    Tyvell Prickett was evaluated in Emergency Department on 09/19/2019 for the symptoms described in the history of present illness. He was evaluated in the context of the global COVID-19 pandemic, which necessitated consideration that the patient might be at risk for infection with the SARS-CoV-2 virus that causes COVID-19. Institutional protocols and algorithms that pertain to the evaluation of patients at risk for COVID-19 are in a state of rapid change based on information released by regulatory bodies including the CDC and federal and state  organizations. These policies and algorithms were followed during the patient's care in the ED.       ____________________________________________   FINAL CLINICAL IMPRESSION(S) / ED DIAGNOSES  Final diagnoses:  Chronic neck and back pain     ED Discharge Orders    None       Note:  This document was prepared using Dragon voice recognition software and may include unintentional dictation errors.    Sable Feil, PA-C 09/19/19 1303    Earleen Newport, MD 09/19/19 [phone]

## 2019-09-21 ENCOUNTER — Other Ambulatory Visit: Payer: Self-pay

## 2019-09-21 DIAGNOSIS — N186 End stage renal disease: Secondary | ICD-10-CM | POA: Insufficient documentation

## 2019-09-21 DIAGNOSIS — I132 Hypertensive heart and chronic kidney disease with heart failure and with stage 5 chronic kidney disease, or end stage renal disease: Secondary | ICD-10-CM | POA: Insufficient documentation

## 2019-09-21 DIAGNOSIS — I509 Heart failure, unspecified: Secondary | ICD-10-CM | POA: Insufficient documentation

## 2019-09-21 DIAGNOSIS — G8929 Other chronic pain: Secondary | ICD-10-CM | POA: Diagnosis not present

## 2019-09-21 DIAGNOSIS — F17210 Nicotine dependence, cigarettes, uncomplicated: Secondary | ICD-10-CM | POA: Diagnosis not present

## 2019-09-21 DIAGNOSIS — Z79899 Other long term (current) drug therapy: Secondary | ICD-10-CM | POA: Insufficient documentation

## 2019-09-21 DIAGNOSIS — M542 Cervicalgia: Secondary | ICD-10-CM | POA: Insufficient documentation

## 2019-09-21 DIAGNOSIS — Z992 Dependence on renal dialysis: Secondary | ICD-10-CM | POA: Diagnosis not present

## 2019-09-21 LAB — BASIC METABOLIC PANEL
Anion gap: 15 (ref 5–15)
BUN: 38 mg/dL — ABNORMAL HIGH (ref 6–20)
CO2: 24 mmol/L (ref 22–32)
Calcium: 8.8 mg/dL — ABNORMAL LOW (ref 8.9–10.3)
Chloride: 94 mmol/L — ABNORMAL LOW (ref 98–111)
Creatinine, Ser: 8.81 mg/dL — ABNORMAL HIGH (ref 0.61–1.24)
GFR calc Af Amer: 7 mL/min — ABNORMAL LOW (ref >60)
GFR calc non Af Amer: 6 mL/min — ABNORMAL LOW (ref >60)
Glucose, Bld: 123 mg/dL — ABNORMAL HIGH (ref 70–99)
Potassium: 4.9 mmol/L (ref 3.5–5.1)
Sodium: 133 mmol/L — ABNORMAL LOW (ref 135–145)

## 2019-09-21 LAB — CBC
HCT: 29.6 % — ABNORMAL LOW (ref 39.0–52.0)
Hemoglobin: 9.5 g/dL — ABNORMAL LOW (ref 13.0–17.0)
MCH: 31.3 pg (ref 26.0–34.0)
MCHC: 32.1 g/dL (ref 30.0–36.0)
MCV: 97.4 fL (ref 80.0–100.0)
Platelets: 324 10*3/uL (ref 150–400)
RBC: 3.04 MIL/uL — ABNORMAL LOW (ref 4.22–5.81)
RDW: 15.4 % (ref 11.5–15.5)
WBC: 6.8 10*3/uL (ref 4.0–10.5)
nRBC: 0 % (ref 0.0–0.2)

## 2019-09-21 NOTE — ED Notes (Signed)
Patient asked another patient to buy him a poptart -second patient came up to stat desk. This RN again informed patient that he was not supposed to eat until evaluated by an EDP. Patient ambulated with steady gait to vending machine and purchased food.

## 2019-09-21 NOTE — ED Triage Notes (Signed)
Pt presents via POV c/o back pain. Reports hx spinal cord injury and reports has procedure at [facility] on Friday. Reports cant take the pain. Requesting to be admitted.

## 2019-09-21 NOTE — ED Notes (Signed)
Patient asked to be pushed to vending machine. Patient informed that patient's are not supposed to eat until evaluated by an EDP.

## 2019-09-21 NOTE — ED Triage Notes (Signed)
Also reports ended dialysis early today due to pain.

## 2019-09-22 ENCOUNTER — Emergency Department
Admission: EM | Admit: 2019-09-22 | Discharge: 2019-09-22 | Disposition: A | Discharge status: Home / Self Care | Payer: Medicare Other | Attending: Student in an Organized Health Care Education/Training Program | Admitting: Student in an Organized Health Care Education/Training Program

## 2019-09-22 DIAGNOSIS — G8929 Other chronic pain: Secondary | ICD-10-CM

## 2019-09-22 DIAGNOSIS — M542 Cervicalgia: Secondary | ICD-10-CM

## 2019-09-22 MED ORDER — HYDROCODONE-ACETAMINOPHEN 5-325 MG PO TABS: 1.0000 | ORAL_TABLET | ORAL | 0 refills | Status: DC | PRN

## 2019-09-22 MED ORDER — HYDROCODONE-ACETAMINOPHEN 5-325 MG PO TABS
1.0000 | ORAL_TABLET | Freq: Once | ORAL | Status: AC
  Administered 2019-09-22: 1 via ORAL
  Filled 2019-09-22: qty 1

## 2019-09-22 NOTE — Discharge Instructions (Signed)

## 2019-09-22 NOTE — ED Provider Notes (Signed)
[facility] Emergency Department Provider Note    First MD Initiated Contact with Patient 09/22/19 0206     (approximate)  I have reviewed the triage vital signs and the nursing notes.   HISTORY  Chief Complaint Back Pain    HPI Jeremy [name] is a 59 y.o. male   with the below listed past medical history presents to the ER for evaluation of recurrent persistent chronic neck pain.  Patient has had extensive evaluation for this and has been followed by neurosurgery with close outpatient follow-up this Friday.  Patient reports presents the ER today due to similar chronic pain that has been having for 17months.  States he ran out of pain medication 48 hours ago and was unable to tolerate pain.  No new numbness or tingling.  No interval injury or falls.  No fevers.  Did go to dialysis today.  Denies any chest pain or shortness of breath.   Past Medical History:  Diagnosis Date  . Bronchitis   . Hypertension   . Renal disorder    kidney failure, dialysis   Family History  Problem Relation Age of Onset  . Hypertension Mother    Past Surgical History:  Procedure Laterality Date  . DIALYSIS FISTULA CREATION     Patient Active Problem List   Diagnosis Date Noted  . Symptomatic anemia 07/26/2019  . ESRD (end stage renal disease) ([place]) 07/26/2019  . Neck pain 07/26/2019  . Melena 07/26/2019  . Hx of atrial fibrillation without current medication 07/26/2019  . Atrial fibrillation with rapid ventricular response ([place]) 04/30/2019  . Syncope 04/29/2019  . Acute CHF (congestive heart failure) ([place]) 02/14/2019  . Acute encephalopathy 11/23/2018  . Influenza B 11/21/2018  . Pulmonary edema 09/22/2018  . Hyperkalemia 05/16/2018      Prior to Admission medications   Medication Sig Start Date End Date Taking? Authorizing Provider  albuterol (PROVENTIL HFA;VENTOLIN HFA) 108 (90 Base) MCG/ACT inhaler Inhale 2 puffs into the lungs every 6 (six) hours as needed for  wheezing or shortness of breath. 11/19/18   Nena Polio, MD  amiodarone (PACERONE) 200 MG tablet Take 1 tablet (200 mg total) by mouth 2 (two) times daily. 05/13/19   Epifanio Lesches, MD  amLODipine (NORVASC) 10 MG tablet Take 10 mg by mouth daily. 05/25/19   [provider]  Budesonide 90 MCG/ACT inhaler Inhale 1 puff into the lungs 2 (two) times daily. 04/23/19 04/22/20  [provider]  butalbital-acetaminophen-caffeine (FIORICET) 50-325-40 MG tablet Take 1 tablet by mouth every 6 (six) hours as needed for headache. 05/20/19 05/19/20  Lilia Pro., MD  calcium acetate (PHOSLO) 667 MG capsule Take 667 mg by mouth 3 (three) times daily with meals.     [provider]  cinacalcet (SENSIPAR) 30 MG tablet Take 30 mg by mouth 3 (three) times a week. 09/04/15   [provider]  fluticasone (FLONASE) 50 MCG/ACT nasal spray Place 2 sprays into both nostrils daily. 04/05/19   Dustin Flock, MD  gabapentin (NEURONTIN) 300 MG capsule Take 1 capsule (300 mg total) by mouth 2 (two) times daily. 06/26/19 08/25/19  Menshew, Dannielle Karvonen, PA-C  HYDROcodone-acetaminophen (NORCO) 5-325 MG tablet Take 1 tablet by mouth every 4 (four) hours as needed for severe pain. 09/22/19   Merlyn Lot, MD  Multiple Vitamin (MULTIVITAMIN WITH MINERALS) TABS tablet Take 1 tablet by mouth daily.    [provider]  oxyCODONE-acetaminophen (PERCOCET) 7.5-325 MG tablet Take 1 tablet by mouth  every 6 (six) hours as needed. 09/15/19   Sable Feil, PA-C  pantoprazole (PROTONIX) 40 MG tablet Take 1 tablet (40 mg total) by mouth daily. 04/05/19   Dustin Flock, MD    Allergies Patient has no known allergies.    Social History Social History   Tobacco Use  . Smoking status: Current Every Day Smoker    Packs/day: 0.50    Types: Cigarettes  . Smokeless tobacco: Never Used  Substance Use Topics  . Alcohol use: Yes    Comment: rare  . Drug use: Yes    Types: Marijuana      Review of Systems Patient denies headaches, rhinorrhea, blurry vision, numbness, shortness of breath, chest pain, edema, cough, abdominal pain, nausea, vomiting, diarrhea, dysuria, fevers, rashes or hallucinations unless otherwise stated above in HPI. ____________________________________________   PHYSICAL EXAM:  VITAL SIGNS: Vitals:   09/21/19 1958  BP: 133/70  Pulse: 86  Resp: 14  Temp: 98.2 F (36.8 C)  SpO2: 92%    Constitutional: Alert and oriented. Able to ambulate with steady gait Eyes: Conjunctivae are normal.  Head: Atraumatic. Nose: No congestion/rhinnorhea. Mouth/Throat: Mucous membranes are moist.   Neck: No stridor. Painless ROM.  Cardiovascular: Normal rate, regular rhythm. Grossly normal heart sounds.  Good peripheral circulation. Respiratory: Normal respiratory effort.  No retractions. Lungs CTAB. Gastrointestinal: Soft and nontender. No distention. No abdominal bruits. No CVA tenderness. Genitourinary:  Musculoskeletal: No lower extremity tenderness nor edema.  No joint effusions. Neurologic:  MAE spontaneously,  Normal speech and language. No gross focal neurologic deficits are appreciated. No facial droop Skin:  Skin is warm, dry and intact. No rash noted. Psychiatric: Mood and affect are normal. Speech and behavior are normal.  ____________________________________________   LABS (all labs ordered are listed, but only abnormal results are displayed)  Results for orders placed or performed during the hospital encounter of 09/22/19 (from the past 24 hour(s))  CBC     Status: Abnormal   Collection Time: 09/21/19  8:19 PM  Result Value Ref Range   WBC 6.8 4.0 - 10.5 K/uL   RBC 3.04 (L) 4.22 - 5.81 MIL/uL   Hemoglobin 9.5 (L) 13.0 - 17.0 g/dL   HCT 29.6 (L) 39.0 - 52.0 %   MCV 97.4 80.0 - 100.0 fL   MCH 31.3 26.0 - 34.0 pg   MCHC 32.1 30.0 - 36.0 g/dL   RDW 15.4 11.5 - 15.5 %   Platelets 324 150 - 400 K/uL   nRBC 0.0 0.0 - 0.2 %  Basic metabolic  panel     Status: Abnormal   Collection Time: 09/21/19  8:19 PM  Result Value Ref Range   Sodium 133 (L) 135 - 145 mmol/L   Potassium 4.9 3.5 - 5.1 mmol/L   Chloride 94 (L) 98 - 111 mmol/L   CO2 24 22 - 32 mmol/L   Glucose, Bld 123 (H) 70 - 99 mg/dL   BUN 38 (H) 6 - 20 mg/dL   Creatinine, Ser 8.81 (H) 0.61 - 1.24 mg/dL   Calcium 8.8 (L) 8.9 - 10.3 mg/dL   GFR calc non Af Amer 6 (L) >60 mL/min   GFR calc Af Amer 7 (L) >60 mL/min   Anion gap 15 5 - 15   ____________________________________________ ____________________________________________   PROCEDURES  Procedure(s) performed:  Procedures    Critical Care performed: no ____________________________________________   INITIAL IMPRESSION / ASSESSMENT AND PLAN / ED COURSE  Pertinent labs & imaging results that were available during my  care of the patient were reviewed by me and considered in my medical decision making (see chart for details).   DDX: Radiculopathy, cervical strain, fracture, stenosis, discitis, epidural abscess  Plummer Laprade is a 59 y.o. who presents to the ED with symptoms as described above.  Presentation consistent with his chronic and persistent neck pain.  Based on reassuring exam that appears stable and consistent with previous no interval trauma with no fever, white count or neuro deficit do not feel that repeat imaging clinically indicated.  Patient will be given brief course of pain control until he has follow-up this week with specialist.  Have discussed with the patient and available family all diagnostics and treatments performed thus far and all questions were answered to the best of my ability. The patient demonstrates understanding and agreement with plan.      The patient was evaluated in Emergency Department today for the symptoms described in the history of present illness. He/she was evaluated in the context of the global COVID-19 pandemic, which necessitated consideration that the patient might be  at risk for infection with the SARS-CoV-2 virus that causes COVID-19. Institutional protocols and algorithms that pertain to the evaluation of patients at risk for COVID-19 are in a state of rapid change based on information released by regulatory bodies including the CDC and federal and state organizations. These policies and algorithms were followed during the patient's care in the ED.  As part of my medical decision making, I reviewed the following data within the [place] notes reviewed and incorporated, Labs reviewed, notes from prior ED visits and [place] Controlled Substance Database   ____________________________________________   FINAL CLINICAL IMPRESSION(S) / ED DIAGNOSES  Final diagnoses:  Chronic neck pain      NEW MEDICATIONS STARTED DURING THIS VISIT:  New Prescriptions   HYDROCODONE-ACETAMINOPHEN (NORCO) 5-325 MG TABLET    Take 1 tablet by mouth every 4 (four) hours as needed for severe pain.     Note:  This document was prepared using Dragon voice recognition software and may include unintentional dictation errors.    Merlyn Lot, MD 09/22/19 [phone]

## 2019-09-22 NOTE — ED Notes (Signed)
Patient states back has been in pain for 5 months. Patient denies difficulty ambulating. Resting in bed with call bell in reach

## 2019-09-25 ENCOUNTER — Other Ambulatory Visit: Payer: Self-pay

## 2019-09-25 ENCOUNTER — Emergency Department
Admission: EM | Admit: 2019-09-25 | Discharge: 2019-09-25 | Disposition: A | Discharge status: Home / Self Care | Payer: Medicare Other | Attending: Student in an Organized Health Care Education/Training Program | Admitting: Student in an Organized Health Care Education/Training Program

## 2019-09-25 DIAGNOSIS — Z79899 Other long term (current) drug therapy: Secondary | ICD-10-CM | POA: Insufficient documentation

## 2019-09-25 DIAGNOSIS — I4891 Unspecified atrial fibrillation: Secondary | ICD-10-CM | POA: Insufficient documentation

## 2019-09-25 DIAGNOSIS — I509 Heart failure, unspecified: Secondary | ICD-10-CM | POA: Diagnosis not present

## 2019-09-25 DIAGNOSIS — F1210 Cannabis abuse, uncomplicated: Secondary | ICD-10-CM | POA: Diagnosis not present

## 2019-09-25 DIAGNOSIS — F17210 Nicotine dependence, cigarettes, uncomplicated: Secondary | ICD-10-CM | POA: Insufficient documentation

## 2019-09-25 DIAGNOSIS — I132 Hypertensive heart and chronic kidney disease with heart failure and with stage 5 chronic kidney disease, or end stage renal disease: Secondary | ICD-10-CM | POA: Diagnosis not present

## 2019-09-25 DIAGNOSIS — Z992 Dependence on renal dialysis: Secondary | ICD-10-CM | POA: Diagnosis not present

## 2019-09-25 DIAGNOSIS — M549 Dorsalgia, unspecified: Secondary | ICD-10-CM | POA: Diagnosis present

## 2019-09-25 DIAGNOSIS — G8929 Other chronic pain: Secondary | ICD-10-CM | POA: Diagnosis not present

## 2019-09-25 DIAGNOSIS — M542 Cervicalgia: Secondary | ICD-10-CM | POA: Diagnosis not present

## 2019-09-25 DIAGNOSIS — N186 End stage renal disease: Secondary | ICD-10-CM | POA: Insufficient documentation

## 2019-09-25 LAB — CBC
HCT: 28.4 % — ABNORMAL LOW (ref 39.0–52.0)
Hemoglobin: 9.2 g/dL — ABNORMAL LOW (ref 13.0–17.0)
MCH: 30.6 pg (ref 26.0–34.0)
MCHC: 32.4 g/dL (ref 30.0–36.0)
MCV: 94.4 fL (ref 80.0–100.0)
Platelets: 308 10*3/uL (ref 150–400)
RBC: 3.01 MIL/uL — ABNORMAL LOW (ref 4.22–5.81)
RDW: 15.5 % (ref 11.5–15.5)
WBC: 4.7 10*3/uL (ref 4.0–10.5)
nRBC: 0 % (ref 0.0–0.2)

## 2019-09-25 LAB — COMPREHENSIVE METABOLIC PANEL
ALT: 8 U/L (ref 0–44)
AST: 17 U/L (ref 15–41)
Albumin: 3.3 g/dL — ABNORMAL LOW (ref 3.5–5.0)
Alkaline Phosphatase: 140 U/L — ABNORMAL HIGH (ref 38–126)
Anion gap: 17 — ABNORMAL HIGH (ref 5–15)
BUN: 37 mg/dL — ABNORMAL HIGH (ref 6–20)
CO2: 23 mmol/L (ref 22–32)
Calcium: 8.4 mg/dL — ABNORMAL LOW (ref 8.9–10.3)
Chloride: 92 mmol/L — ABNORMAL LOW (ref 98–111)
Creatinine, Ser: 8.36 mg/dL — ABNORMAL HIGH (ref 0.61–1.24)
GFR calc Af Amer: 7 mL/min — ABNORMAL LOW (ref >60)
GFR calc non Af Amer: 6 mL/min — ABNORMAL LOW (ref >60)
Glucose, Bld: 90 mg/dL (ref 70–99)
Potassium: 4.4 mmol/L (ref 3.5–5.1)
Sodium: 132 mmol/L — ABNORMAL LOW (ref 135–145)
Total Bilirubin: 0.6 mg/dL (ref 0.3–1.2)
Total Protein: 7.8 g/dL (ref 6.5–8.1)

## 2019-09-25 MED ORDER — TRAMADOL HCL 50 MG PO TABS: 50.0000 mg | ORAL_TABLET | Freq: Four times a day (QID) | ORAL | 0 refills | Status: DC | PRN

## 2019-09-25 MED ORDER — DEXAMETHASONE SODIUM PHOSPHATE 10 MG/ML IJ SOLN
10.0000 mg | Freq: Once | INTRAMUSCULAR | Status: AC
  Administered 2019-09-25: 08:00:00 10 mg via INTRAMUSCULAR
  Filled 2019-09-25: qty 1

## 2019-09-25 NOTE — ED Provider Notes (Signed)
[facility] Emergency Department Provider Note   ____________________________________________   First MD Initiated Contact with Patient 09/25/19 0710     (approximate)  I have reviewed the triage vital signs and the nursing notes.   HISTORY  Chief Complaint Back Pain   HPI Jeremy [name] is a 59 y.o. male presents to the ED today for recurrent persistent chronic neck pain.  Patient has been seen in this ED for the same.  Patient denies any recent injury or changes.  This is the third ED visit in 7 days with each time he is requesting narcotics.  Patient has seen a neurosurgeon and states that there are plans for surgery but he does not give a specific date.  He states that he sees a Teacher, music.  He rates his pain as a 10/10.         Past Medical History:  Diagnosis Date  . Bronchitis   . Hypertension   . Renal disorder    kidney failure, dialysis    Patient Active Problem List   Diagnosis Date Noted  . Symptomatic anemia 07/26/2019  . ESRD (end stage renal disease) ([place]) 07/26/2019  . Neck pain 07/26/2019  . Melena 07/26/2019  . Hx of atrial fibrillation without current medication 07/26/2019  . Atrial fibrillation with rapid ventricular response ([place]) 04/30/2019  . Syncope 04/29/2019  . Acute CHF (congestive heart failure) ([place]) 02/14/2019  . Acute encephalopathy 11/23/2018  . Influenza B 11/21/2018  . Pulmonary edema 09/22/2018  . Hyperkalemia 05/16/2018    Past Surgical History:  Procedure Laterality Date  . DIALYSIS FISTULA CREATION      Prior to Admission medications   Medication Sig Start Date End Date Taking? Authorizing Provider  albuterol (PROVENTIL HFA;VENTOLIN HFA) 108 (90 Base) MCG/ACT inhaler Inhale 2 puffs into the lungs every 6 (six) hours as needed for wheezing or shortness of breath. 11/19/18   Nena Polio, MD  amiodarone (PACERONE) 200 MG tablet Take 1 tablet (200 mg total) by mouth 2 (two) times daily. 05/13/19    Epifanio Lesches, MD  amLODipine (NORVASC) 10 MG tablet Take 10 mg by mouth daily. 05/25/19   [provider]  Budesonide 90 MCG/ACT inhaler Inhale 1 puff into the lungs 2 (two) times daily. 04/23/19 04/22/20  [provider]  butalbital-acetaminophen-caffeine (FIORICET) 50-325-40 MG tablet Take 1 tablet by mouth every 6 (six) hours as needed for headache. 05/20/19 05/19/20  Lilia Pro., MD  calcium acetate (PHOSLO) 667 MG capsule Take 667 mg by mouth 3 (three) times daily with meals.     [provider]  cinacalcet (SENSIPAR) 30 MG tablet Take 30 mg by mouth 3 (three) times a week. 09/04/15   [provider]  fluticasone (FLONASE) 50 MCG/ACT nasal spray Place 2 sprays into both nostrils daily. 04/05/19   Dustin Flock, MD  gabapentin (NEURONTIN) 300 MG capsule Take 1 capsule (300 mg total) by mouth 2 (two) times daily. 06/26/19 08/25/19  Menshew, Dannielle Karvonen, PA-C  Multiple Vitamin (MULTIVITAMIN WITH MINERALS) TABS tablet Take 1 tablet by mouth daily.    [provider]  pantoprazole (PROTONIX) 40 MG tablet Take 1 tablet (40 mg total) by mouth daily. 04/05/19   Dustin Flock, MD  traMADol (ULTRAM) 50 MG tablet Take 1 tablet (50 mg total) by mouth every 6 (six) hours as needed. 09/25/19   Johnn Hai, PA-C    Allergies Patient has no known allergies.  Family History  Problem Relation Age of  Onset  . Hypertension Mother     Social History Social History   Tobacco Use  . Smoking status: Current Every Day Smoker    Packs/day: 0.50    Types: Cigarettes  . Smokeless tobacco: Never Used  Substance Use Topics  . Alcohol use: Yes    Comment: rare  . Drug use: Yes    Types: Marijuana    Review of Systems Constitutional: No fever/chills ENT: No sore throat. Cardiovascular: Denies chest pain. Respiratory: Denies shortness of breath. Gastrointestinal:   No nausea, no vomiting.   Musculoskeletal: Positive for chronic neck  pain. Skin: Negative for rash. Neurological: Negative for headaches, focal weakness or numbness. ___________________________________________   PHYSICAL EXAM:  VITAL SIGNS: ED Triage Vitals [09/25/19 0440]  Enc Vitals Group     BP (!) 143/66     Pulse Rate 81     Resp 16     Temp 98.2 F (36.8 C)     Temp Source Oral     SpO2 96 %     Weight 149 lb (67.6 kg)     Height 5\' 6"  (1.676 m)     Head Circumference      Peak Flow      Pain Score 10     Pain Loc      Pain Edu?      Excl. in [place]?     Constitutional: Alert and oriented. Well appearing and in no acute distress. Eyes: Conjunctivae are normal.  Head: Atraumatic. Neck: No stridor.  Patient sits with his neck flexed forward.  No skin discoloration or evidence of trauma.  Patient is tender to very light palpation in all areas of his cervical spine and paravertebral muscles bilaterally. Cardiovascular: Normal rate, regular rhythm. Grossly normal heart sounds.  Good peripheral circulation. Respiratory: Normal respiratory effort.  No retractions. Lungs CTAB. Musculoskeletal: Cervical spine as noted above. Neurologic:  Normal speech and language. No gross focal neurologic deficits are appreciated. No gait instability. Skin:  Skin is warm, dry and intact.  Psychiatric: Mood and affect are normal. Speech and behavior are normal.  ____________________________________________   LABS (all labs ordered are listed, but only abnormal results are displayed)  Labs Reviewed  CBC - Abnormal; Notable for the following components:      Result Value   RBC 3.01 (*)    Hemoglobin 9.2 (*)    HCT 28.4 (*)    All other components within normal limits  COMPREHENSIVE METABOLIC PANEL - Abnormal; Notable for the following components:   Sodium 132 (*)    Chloride 92 (*)    BUN 37 (*)    Creatinine, Ser 8.36 (*)    Calcium 8.4 (*)    Albumin 3.3 (*)    Alkaline Phosphatase 140 (*)    GFR calc non Af Amer 6 (*)    GFR calc Af Amer 7 (*)     Anion gap 17 (*)    All other components within normal limits    PROCEDURES  Procedure(s) performed (including Critical Care):  Procedures   ____________________________________________   INITIAL IMPRESSION / ASSESSMENT AND PLAN / ED COURSE  As part of my medical decision making, I reviewed the following data within the electronic MEDICAL RECORD NUMBER Notes from prior ED visits and [place] Controlled Substance Database  59 year old male presents to the ED with chronic cervical pain without recent injury.  Patient states he has an appointment with his neurosurgeon tomorrow.  He states that he does not have any pain  medication.  On first entering the room patient immediately said he needed morphine.  He denies any changes in his condition.  Review of his cervical spine x-rays and MRI showed chronic changes.  Patient was given an injection of Decadron 10 mg IM.  A prescription for tramadol 1 every 6 hours #12 was written and patient is aware.  He is encouraged to keep his appointment with his doctor.  ____________________________________________   FINAL CLINICAL IMPRESSION(S) / ED DIAGNOSES  Final diagnoses:  Chronic cervical pain     ED Discharge Orders         Ordered    traMADol (ULTRAM) 50 MG tablet  Every 6 hours PRN     09/25/19 0837           Note:  This document was prepared using Dragon voice recognition software and may include unintentional dictation errors.    Johnn Hai, PA-C 09/25/19 1026    Merlyn Lot, MD 09/25/19 1227

## 2019-09-25 NOTE — ED Notes (Signed)
Spoke with dr. Kerman Passey regarding pt's symptoms and history of dialysis. Orders received.

## 2019-09-25 NOTE — ED Triage Notes (Addendum)
Pt reports multiple falls over last several months resulting in upper back pain/neck pain. Pt complains of upper back pain for six months worsening over last 2 months. Pt is able to bend neck to chest independently without difficulty. Pt states he called ems tonight because he could not sleep due to pain.

## 2019-09-25 NOTE — Discharge Instructions (Addendum)
Call your neurosurgeon for follow-up.  A prescription for tramadol was sent to your pharmacy.  Take only as directed.

## 2019-09-28 ENCOUNTER — Other Ambulatory Visit: Payer: Self-pay

## 2019-09-28 ENCOUNTER — Emergency Department
Admission: EM | Admit: 2019-09-28 | Discharge: 2019-09-28 | Disposition: A | Discharge status: Home / Self Care | Payer: Medicare Other | Attending: Emergency Medicine | Admitting: Emergency Medicine

## 2019-09-28 DIAGNOSIS — F17210 Nicotine dependence, cigarettes, uncomplicated: Secondary | ICD-10-CM | POA: Diagnosis not present

## 2019-09-28 DIAGNOSIS — I509 Heart failure, unspecified: Secondary | ICD-10-CM | POA: Insufficient documentation

## 2019-09-28 DIAGNOSIS — Z79899 Other long term (current) drug therapy: Secondary | ICD-10-CM | POA: Diagnosis not present

## 2019-09-28 DIAGNOSIS — G8929 Other chronic pain: Secondary | ICD-10-CM | POA: Insufficient documentation

## 2019-09-28 DIAGNOSIS — I132 Hypertensive heart and chronic kidney disease with heart failure and with stage 5 chronic kidney disease, or end stage renal disease: Secondary | ICD-10-CM | POA: Diagnosis not present

## 2019-09-28 DIAGNOSIS — Z992 Dependence on renal dialysis: Secondary | ICD-10-CM | POA: Diagnosis not present

## 2019-09-28 DIAGNOSIS — M542 Cervicalgia: Secondary | ICD-10-CM

## 2019-09-28 DIAGNOSIS — K625 Hemorrhage of anus and rectum: Secondary | ICD-10-CM | POA: Diagnosis not present

## 2019-09-28 DIAGNOSIS — N186 End stage renal disease: Secondary | ICD-10-CM | POA: Diagnosis not present

## 2019-09-28 LAB — CBC
HCT: 31.2 % — ABNORMAL LOW (ref 39.0–52.0)
Hemoglobin: 9.7 g/dL — ABNORMAL LOW (ref 13.0–17.0)
MCH: 30.1 pg (ref 26.0–34.0)
MCHC: 31.1 g/dL (ref 30.0–36.0)
MCV: 96.9 fL (ref 80.0–100.0)
Platelets: 332 10*3/uL (ref 150–400)
RBC: 3.22 MIL/uL — ABNORMAL LOW (ref 4.22–5.81)
RDW: 16.5 % — ABNORMAL HIGH (ref 11.5–15.5)
WBC: 7.4 10*3/uL (ref 4.0–10.5)
nRBC: 0 % (ref 0.0–0.2)

## 2019-09-28 LAB — COMPREHENSIVE METABOLIC PANEL
ALT: 8 U/L (ref 0–44)
AST: 19 U/L (ref 15–41)
Albumin: 3.6 g/dL (ref 3.5–5.0)
Alkaline Phosphatase: 133 U/L — ABNORMAL HIGH (ref 38–126)
Anion gap: 21 — ABNORMAL HIGH (ref 5–15)
BUN: 50 mg/dL — ABNORMAL HIGH (ref 6–20)
CO2: 19 mmol/L — ABNORMAL LOW (ref 22–32)
Calcium: 9 mg/dL (ref 8.9–10.3)
Chloride: 95 mmol/L — ABNORMAL LOW (ref 98–111)
Creatinine, Ser: 10 mg/dL — ABNORMAL HIGH (ref 0.61–1.24)
GFR calc Af Amer: 6 mL/min — ABNORMAL LOW (ref >60)
GFR calc non Af Amer: 5 mL/min — ABNORMAL LOW (ref >60)
Glucose, Bld: 114 mg/dL — ABNORMAL HIGH (ref 70–99)
Potassium: 5.1 mmol/L (ref 3.5–5.1)
Sodium: 135 mmol/L (ref 135–145)
Total Bilirubin: 0.8 mg/dL (ref 0.3–1.2)
Total Protein: 8.5 g/dL — ABNORMAL HIGH (ref 6.5–8.1)

## 2019-09-28 LAB — TYPE AND SCREEN
ABO/RH(D): O POS
Antibody Screen: NEGATIVE

## 2019-09-28 MED ORDER — OXYCODONE-ACETAMINOPHEN 5-325 MG PO TABS: 1.0000 | ORAL_TABLET | ORAL | 0 refills | Status: DC | PRN

## 2019-09-28 MED ORDER — PATIROMER SORBITEX CALCIUM 8.4 G PO PACK
16.8000 g | PACK | Freq: Once | ORAL | Status: AC
  Administered 2019-09-28: 17:00:00 16.8 g via ORAL
  Filled 2019-09-28: qty 2

## 2019-09-28 MED ORDER — MORPHINE SULFATE (PF) 4 MG/ML IV SOLN
4.0000 mg | Freq: Once | INTRAVENOUS | Status: AC
  Administered 2019-09-28: 4 mg via INTRAMUSCULAR
  Filled 2019-09-28: qty 1

## 2019-09-28 NOTE — ED Triage Notes (Addendum)
Pt was seen here in the past week for having fall with neck injury/ states he needs pain meds. Denies any new injury states tramadol is not helping. Pt also c/o having 1 bloody stool today and states he missed his dialysis today.

## 2019-09-28 NOTE — Discharge Instructions (Addendum)
As discussed please follow-up with your scheduled dialysis first thing Friday morning.  Please call the number today or tomorrow to arrange a GI appointment as soon as possible for further evaluation.  Return to the emergency department for any significant rectal bleeding, lightheadedness or any other symptom personally concerning to yourself.  As we discussed I have written you a short course of pain medication, we cannot continue to write narcotic pain medication from the emergency department.  In the future please follow-up with your primary doctor or Dr. Cook/neurosurgery to discuss your pain management options.

## 2019-09-28 NOTE — ED Provider Notes (Signed)
[name] Emergency Department Provider Note  Time seen: 4:21 PM  I have reviewed the triage vital signs and the nursing notes.   HISTORY  Chief Complaint Neck Pain and GI Bleeding   HPI Jeremy [name] is a 59 y.o. male with a past medical history of hypertension, ESRD on HD Monday/Wednesday/Friday, chronic pain, presents to the emergency department for worsening neck and back pain.  Patient states he is experiencing pain in his neck and down both of his arms.  Has been experiencing the same pain for approximately for 5 months at this point.  Patient has seen a neurosurgeon Dr. Lacinda Axon and is currently in the process of getting arranged for surgery per patient.  Patient denies any fever cough or shortness of breath.  Patient states he thought he saw some blood in his stool this morning as well.   Denies any abdominal pain.  Past Medical History:  Diagnosis Date  . Bronchitis   . Hypertension   . Renal disorder    kidney failure, dialysis    Patient Active Problem List   Diagnosis Date Noted  . Symptomatic anemia 07/26/2019  . ESRD (end stage renal disease) ([place]) 07/26/2019  . Neck pain 07/26/2019  . Melena 07/26/2019  . Hx of atrial fibrillation without current medication 07/26/2019  . Atrial fibrillation with rapid ventricular response ([place]) 04/30/2019  . Syncope 04/29/2019  . Acute CHF (congestive heart failure) ([place]) 02/14/2019  . Acute encephalopathy 11/23/2018  . Influenza B 11/21/2018  . Pulmonary edema 09/22/2018  . Hyperkalemia 05/16/2018    Past Surgical History:  Procedure Laterality Date  . DIALYSIS FISTULA CREATION      Prior to Admission medications   Medication Sig Start Date End Date Taking? Authorizing Provider  albuterol (PROVENTIL HFA;VENTOLIN HFA) 108 (90 Base) MCG/ACT inhaler Inhale 2 puffs into the lungs every 6 (six) hours as needed for wheezing or shortness of breath. 11/19/18   Nena Polio, MD  amiodarone (PACERONE) 200 MG  tablet Take 1 tablet (200 mg total) by mouth 2 (two) times daily. 05/13/19   Epifanio Lesches, MD  amLODipine (NORVASC) 10 MG tablet Take 10 mg by mouth daily. 05/25/19   [provider]  Budesonide 90 MCG/ACT inhaler Inhale 1 puff into the lungs 2 (two) times daily. 04/23/19 04/22/20  [provider]  butalbital-acetaminophen-caffeine (FIORICET) 50-325-40 MG tablet Take 1 tablet by mouth every 6 (six) hours as needed for headache. 05/20/19 05/19/20  Lilia Pro., MD  calcium acetate (PHOSLO) 667 MG capsule Take 667 mg by mouth 3 (three) times daily with meals.     [provider]  cinacalcet (SENSIPAR) 30 MG tablet Take 30 mg by mouth 3 (three) times a week. 09/04/15   [provider]  fluticasone (FLONASE) 50 MCG/ACT nasal spray Place 2 sprays into both nostrils daily. 04/05/19   Dustin Flock, MD  gabapentin (NEURONTIN) 300 MG capsule Take 1 capsule (300 mg total) by mouth 2 (two) times daily. 06/26/19 08/25/19  Menshew, Dannielle Karvonen, PA-C  Multiple Vitamin (MULTIVITAMIN WITH MINERALS) TABS tablet Take 1 tablet by mouth daily.    [provider]  pantoprazole (PROTONIX) 40 MG tablet Take 1 tablet (40 mg total) by mouth daily. 04/05/19   Dustin Flock, MD  traMADol (ULTRAM) 50 MG tablet Take 1 tablet (50 mg total) by mouth every 6 (six) hours as needed. 09/25/19   Johnn Hai, PA-C    No Known Allergies  Family History  Problem Relation Age  of Onset  . Hypertension Mother     Social History Social History   Tobacco Use  . Smoking status: Current Every Day Smoker    Packs/day: 0.50    Types: Cigarettes  . Smokeless tobacco: Never Used  Substance Use Topics  . Alcohol use: Yes    Comment: rare  . Drug use: Yes    Types: Marijuana    Review of Systems Constitutional: Negative for fever. Cardiovascular: Negative for chest pain. Respiratory: Negative for shortness of breath. Gastrointestinal: Negative for abdominal  pain Musculoskeletal: Neck pain, back pain radiating down his arms. Neurological: Negative for headache All other ROS negative  ____________________________________________   PHYSICAL EXAM:  VITAL SIGNS: ED Triage Vitals  Enc Vitals Group     BP 09/28/19 1412 (!) 142/73     Pulse Rate 09/28/19 1412 88     Resp 09/28/19 1412 17     Temp 09/28/19 1412 98.5 F (36.9 C)     Temp Source 09/28/19 1412 Oral     SpO2 09/28/19 1412 97 %     Weight 09/28/19 1400 149 lb (67.6 kg)     Height 09/28/19 1400 5\' 6"  (1.676 m)     Head Circumference --      Peak Flow --      Pain Score 09/28/19 1400 10     Pain Loc --      Pain Edu? --      Excl. in [place]? --    Constitutional: Alert and oriented.  Appears mildly uncomfortable. Eyes: Normal exam ENT      Head: Normocephalic and atraumatic.      Mouth/Throat: Mucous membranes are moist. Cardiovascular: Normal rate, regular rhythm. Respiratory: Normal respiratory effort without tachypnea nor retractions. Breath sounds are clear  Gastrointestinal: Soft and nontender. No distention.   Musculoskeletal: Tenderness in his neck and back, shoulders Neurologic:  Normal speech and language. No gross focal neurologic deficits Skin:  Skin is warm, dry and intact.  Psychiatric: Mood and affect are normal.   ____________________________________________   INITIAL IMPRESSION / ASSESSMENT AND PLAN / ED COURSE  Pertinent labs & imaging results that were available during my care of the patient were reviewed by me and considered in my medical decision making (see chart for details).   Patient presents to the emergency department for continued pain in his neck back and down his arms.  Patient has been seen for the same multiple times, is currently following up with Dr. Lacinda Axon of neurosurgery.  Patient is requesting pain medication.  We will perform a quick rectal exam to ensure no significant GI bleed.  Patient's lab work including H&H is largely baseline for  the patient.  Potassium reassuringly is only 5.1.  We will attempt to dose V[place]assa in the emergency department, patient would be able to be discharged home and follow-up for dialysis on Friday.  I discussed in depth with the patient the need to follow-up with Dr. Lacinda Axon and possibly with a pain management specialist.  Told him I would write him for short course of pain medication today as I do believe the patient is in legitimate pain, but I told him going forward we would not be able to refill any further narcotic medications from the emergency department for him and he needs to follow-up with his physician and neurosurgeon.  Patient understands, we will discharge with a short course of Percocet.  Patient will follow up for dialysis Friday morning.  Rectal exam shows light brown stool but  it is trace guaiac positive.  We will refer to GI medicine for further work-up.  Patient agreeable to plan of care.  Again reiterated to the patient that we will not be filling any further narcotic pain medication in the emergency department.  Patient understands and will follow up with Dr. Lacinda Axon.  Jeremy [name] was evaluated in Emergency Department on 09/28/2019 for the symptoms described in the history of present illness. He was evaluated in the context of the global COVID-19 pandemic, which necessitated consideration that the patient might be at risk for infection with the SARS-CoV-2 virus that causes COVID-19. Institutional protocols and algorithms that pertain to the evaluation of patients at risk for COVID-19 are in a state of rapid change based on information released by regulatory bodies including the CDC and federal and state organizations. These policies and algorithms were followed during the patient's care in the ED.  ____________________________________________   FINAL CLINICAL IMPRESSION(S) / ED DIAGNOSES  Acute on chronic pain Rectal bleeding   Harvest Dark, MD 09/28/19 1629

## 2019-10-20 ENCOUNTER — Encounter: Payer: Self-pay | Admitting: Emergency Medicine

## 2019-10-20 ENCOUNTER — Inpatient Hospital Stay
Admission: EM | Admit: 2019-10-20 | Discharge: 2019-10-21 | DRG: 551 | Disposition: A | Discharge status: Other Acute Inpatient Hospital | Payer: Medicare Other | Attending: Internal Medicine | Admitting: Internal Medicine

## 2019-10-20 ENCOUNTER — Inpatient Hospital Stay: Payer: Medicare Other

## 2019-10-20 ENCOUNTER — Emergency Department: Payer: Medicare Other

## 2019-10-20 ENCOUNTER — Other Ambulatory Visit: Payer: Self-pay

## 2019-10-20 DIAGNOSIS — R531 Weakness: Secondary | ICD-10-CM

## 2019-10-20 DIAGNOSIS — R296 Repeated falls: Secondary | ICD-10-CM | POA: Diagnosis present

## 2019-10-20 DIAGNOSIS — M4802 Spinal stenosis, cervical region: Secondary | ICD-10-CM | POA: Diagnosis present

## 2019-10-20 DIAGNOSIS — Z79899 Other long term (current) drug therapy: Secondary | ICD-10-CM | POA: Diagnosis not present

## 2019-10-20 DIAGNOSIS — E1169 Type 2 diabetes mellitus with other specified complication: Secondary | ICD-10-CM | POA: Diagnosis present

## 2019-10-20 DIAGNOSIS — N2581 Secondary hyperparathyroidism of renal origin: Secondary | ICD-10-CM | POA: Diagnosis present

## 2019-10-20 DIAGNOSIS — M4642 Discitis, unspecified, cervical region: Secondary | ICD-10-CM | POA: Diagnosis present

## 2019-10-20 DIAGNOSIS — N186 End stage renal disease: Secondary | ICD-10-CM

## 2019-10-20 DIAGNOSIS — G959 Disease of spinal cord, unspecified: Secondary | ICD-10-CM | POA: Diagnosis present

## 2019-10-20 DIAGNOSIS — Z9115 Patient's noncompliance with renal dialysis: Secondary | ICD-10-CM

## 2019-10-20 DIAGNOSIS — R34 Anuria and oliguria: Secondary | ICD-10-CM | POA: Diagnosis present

## 2019-10-20 DIAGNOSIS — Z992 Dependence on renal dialysis: Secondary | ICD-10-CM

## 2019-10-20 DIAGNOSIS — Z8249 Family history of ischemic heart disease and other diseases of the circulatory system: Secondary | ICD-10-CM

## 2019-10-20 DIAGNOSIS — F17210 Nicotine dependence, cigarettes, uncomplicated: Secondary | ICD-10-CM | POA: Diagnosis present

## 2019-10-20 DIAGNOSIS — E1122 Type 2 diabetes mellitus with diabetic chronic kidney disease: Secondary | ICD-10-CM | POA: Diagnosis present

## 2019-10-20 DIAGNOSIS — J449 Chronic obstructive pulmonary disease, unspecified: Secondary | ICD-10-CM | POA: Diagnosis present

## 2019-10-20 DIAGNOSIS — I132 Hypertensive heart and chronic kidney disease with heart failure and with stage 5 chronic kidney disease, or end stage renal disease: Secondary | ICD-10-CM | POA: Diagnosis present

## 2019-10-20 DIAGNOSIS — E875 Hyperkalemia: Secondary | ICD-10-CM | POA: Diagnosis present

## 2019-10-20 DIAGNOSIS — Z20822 Contact with and (suspected) exposure to covid-19: Secondary | ICD-10-CM | POA: Diagnosis present

## 2019-10-20 DIAGNOSIS — D631 Anemia in chronic kidney disease: Secondary | ICD-10-CM | POA: Diagnosis present

## 2019-10-20 DIAGNOSIS — M4622 Osteomyelitis of vertebra, cervical region: Secondary | ICD-10-CM | POA: Diagnosis present

## 2019-10-20 DIAGNOSIS — I5032 Chronic diastolic (congestive) heart failure: Secondary | ICD-10-CM | POA: Diagnosis present

## 2019-10-20 DIAGNOSIS — I480 Paroxysmal atrial fibrillation: Secondary | ICD-10-CM

## 2019-10-20 DIAGNOSIS — I509 Heart failure, unspecified: Secondary | ICD-10-CM

## 2019-10-20 LAB — BASIC METABOLIC PANEL
Anion gap: 16 — ABNORMAL HIGH (ref 5–15)
BUN: 41 mg/dL — ABNORMAL HIGH (ref 6–20)
CO2: 21 mmol/L — ABNORMAL LOW (ref 22–32)
Calcium: 7.7 mg/dL — ABNORMAL LOW (ref 8.9–10.3)
Chloride: 101 mmol/L (ref 98–111)
Creatinine, Ser: 10.48 mg/dL — ABNORMAL HIGH (ref 0.61–1.24)
GFR calc Af Amer: 6 mL/min — ABNORMAL LOW (ref >60)
GFR calc non Af Amer: 5 mL/min — ABNORMAL LOW (ref >60)
Glucose, Bld: 82 mg/dL (ref 70–99)
Potassium: 5.6 mmol/L — ABNORMAL HIGH (ref 3.5–5.1)
Sodium: 138 mmol/L (ref 135–145)

## 2019-10-20 LAB — CBC
HCT: 31.5 % — ABNORMAL LOW (ref 39.0–52.0)
Hemoglobin: 10 g/dL — ABNORMAL LOW (ref 13.0–17.0)
MCH: 31 pg (ref 26.0–34.0)
MCHC: 31.7 g/dL (ref 30.0–36.0)
MCV: 97.5 fL (ref 80.0–100.0)
Platelets: 336 10*3/uL (ref 150–400)
RBC: 3.23 MIL/uL — ABNORMAL LOW (ref 4.22–5.81)
RDW: 17.1 % — ABNORMAL HIGH (ref 11.5–15.5)
WBC: 7.6 10*3/uL (ref 4.0–10.5)
nRBC: 0 % (ref 0.0–0.2)

## 2019-10-20 LAB — RESPIRATORY PANEL BY RT PCR (FLU A&B, COVID)
Influenza A by PCR: NEGATIVE
Influenza B by PCR: NEGATIVE
SARS Coronavirus 2 by RT PCR: NEGATIVE

## 2019-10-20 MED ORDER — ACETAMINOPHEN 325 MG PO TABS: 650.0000 mg | ORAL_TABLET | Freq: Four times a day (QID) | ORAL | Status: DC | PRN

## 2019-10-20 MED ORDER — ZOLPIDEM TARTRATE 5 MG PO TABS: 5.0000 mg | ORAL_TABLET | Freq: Every evening | ORAL | Status: DC | PRN

## 2019-10-20 MED ORDER — HYDROXYZINE HCL 25 MG PO TABS
25.0000 mg | ORAL_TABLET | Freq: Three times a day (TID) | ORAL | Status: DC | PRN
  Filled 2019-10-20: qty 1

## 2019-10-20 MED ORDER — OXYCODONE HCL ER 10 MG PO T12A
10.0000 mg | EXTENDED_RELEASE_TABLET | Freq: Two times a day (BID) | ORAL | Status: DC
  Administered 2019-10-20 – 2019-10-21 (×2): 10 mg via ORAL
  Filled 2019-10-20 (×2): qty 1

## 2019-10-20 MED ORDER — DOCUSATE SODIUM 283 MG RE ENEM: 1.0000 | ENEMA | RECTAL | Status: DC | PRN

## 2019-10-20 MED ORDER — CALCIUM CARBONATE ANTACID 500 MG PO CHEW: 500.0000 mg | CHEWABLE_TABLET | Freq: Four times a day (QID) | ORAL | Status: DC | PRN

## 2019-10-20 MED ORDER — SORBITOL 70 % SOLN
30.0000 mL | Status: DC | PRN
  Filled 2019-10-20: qty 30

## 2019-10-20 MED ORDER — ONDANSETRON HCL 4 MG/2ML IJ SOLN: 4.0000 mg | Freq: Four times a day (QID) | INTRAMUSCULAR | Status: DC | PRN

## 2019-10-20 MED ORDER — SODIUM ZIRCONIUM CYCLOSILICATE 10 G PO PACK
10.0000 g | PACK | ORAL | Status: AC
  Administered 2019-10-20: 19:00:00 10 g via ORAL
  Filled 2019-10-20: qty 1

## 2019-10-20 MED ORDER — ONDANSETRON HCL 4 MG PO TABS: 4.0000 mg | ORAL_TABLET | Freq: Four times a day (QID) | ORAL | Status: DC | PRN

## 2019-10-20 MED ORDER — ACETAMINOPHEN 650 MG RE SUPP: 650.0000 mg | Freq: Four times a day (QID) | RECTAL | Status: DC | PRN

## 2019-10-20 MED ORDER — CAMPHOR-MENTHOL 0.5-0.5 % EX LOTN
1.0000 "application " | TOPICAL_LOTION | Freq: Three times a day (TID) | CUTANEOUS | Status: DC | PRN
  Filled 2019-10-20: qty 222

## 2019-10-20 MED ORDER — IOHEXOL 9 MG/ML PO SOLN: 500.0000 mL | ORAL | Status: DC

## 2019-10-20 MED ORDER — MORPHINE SULFATE (PF) 2 MG/ML IV SOLN
2.0000 mg | Freq: Once | INTRAVENOUS | Status: AC
  Administered 2019-10-21: 2 mg via INTRAVENOUS
  Filled 2019-10-20: qty 1

## 2019-10-20 MED ORDER — HEPARIN SODIUM (PORCINE) 5000 UNIT/ML IJ SOLN
5000.0000 [IU] | Freq: Three times a day (TID) | INTRAMUSCULAR | Status: DC
  Filled 2019-10-20 (×2): qty 1

## 2019-10-20 MED ORDER — NEPRO/CARBSTEADY PO LIQD: 237.0000 mL | Freq: Three times a day (TID) | ORAL | Status: DC | PRN

## 2019-10-20 MED ORDER — SODIUM CHLORIDE 0.9 % IV BOLUS
500.0000 mL | Freq: Once | INTRAVENOUS | Status: AC
  Administered 2019-10-20: 18:00:00 500 mL via INTRAVENOUS

## 2019-10-20 NOTE — ED Notes (Signed)
Claiborne Billings RN attempted to get IV access with no success

## 2019-10-20 NOTE — ED Triage Notes (Signed)
Patient presents to the ED with increased weakness over the past several days.  Patient states he missed his dialysis appointment on Monday and Wednesday due to difficulty controlling his limbs due to a previous spinal cord injury.  Patient states he was unable to stand and walk to get to his car.  Patient lives alone.  Patient states he is supposed to have a pre-op appointment today in [place] but was unable to go.  Patient states when he attempts to stand, he falls.

## 2019-10-20 NOTE — H&P (Signed)
History and Physical   Jeremy [name] [id number] DOB: 26-May-1961 DOA: 10/20/2019  Referring MD/NP/PA: Dr. Joni Fears  PCP: Patient, No Pcp Per   Outpatient Specialists: Lysle Morales, neurosurgery  Patient coming from: Home  Chief Complaint: Generalized weakness  HPI: Jeremy [name] is a 59 y.o. male with medical history significant of end-stage renal disease on hemodialysis who has missed dialysis for the last 6 days, cervical myelopathy, hypertension, medication noncompliance atrial fibrillation, diastolic dysfunction CHF who came in with generalized weakness.  He has progressively getting more weak in the last week.  He again has had no dialysis in 6 days.  Patient had complained also of worsening weakness in his upper and lower extremities.  He is being managed by [facility] neurosurgery for his myelopathy.  Recommendation was for him to get a repeat MRI of the cervical spine as well as go to [facility] for preoperative evaluation in anticipation of possible cervical surgery.  Patient reported unable to stand or walk.  He could not even drive his car.  This is new for him.  This is partly the reason why he was unable to go to his dialysis as he reports.  No recent injury.  No fever or chills.  No exposure to COVID-19.  Patient evaluated in the ER and is being admitted to the hospital for medical evaluation and treatment.  ED Course: Temperature 97 blood pressure 168/89 pulse 81 respiratory rate of 20 oxygen sat 92% on room air.  Sodium is 138 potassium 5.6 chloride 101 CO2 21 glucose 82 BUN 40 creatinine 10.48 with a gap of 16.  CBC showed a hemoglobin 10 otherwise within normal.  COVID-19 is negative.  Normal x-ray is largely within normal.  MRI cervical spine showed new height loss at C6 or C7.  Concern for possible discitis or osteomyelitis or ambulate arthroplasty.  Severe spinal canal stenosis with impingement on the spinal cord.  There is prevertebral effusion at C2-T1.  Also severe spinal canal stenosis of C4-5  C6-7 and C3-4.  CT of the cervical spine currently ordered.  Patient will be admitted for hemodialysis as well as neurosurgical evaluation  Review of Systems: As per HPI otherwise 10 point review of systems negative.    Past Medical History:  Diagnosis Date  . Bronchitis   . Hypertension   . Renal disorder    kidney failure, dialysis    Past Surgical History:  Procedure Laterality Date  . DIALYSIS FISTULA CREATION       reports that he has been smoking cigarettes. He has been smoking about 0.50 packs per day. He has never used smokeless tobacco. He reports current alcohol use. He reports current drug use. Drug: Marijuana.  No Known Allergies  Family History  Problem Relation Age of Onset  . Hypertension Mother      Prior to Admission medications   Medication Sig Start Date End Date Taking? Authorizing Provider  albuterol (PROVENTIL HFA;VENTOLIN HFA) 108 (90 Base) MCG/ACT inhaler Inhale 2 puffs into the lungs every 6 (six) hours as needed for wheezing or shortness of breath. 11/19/18  Yes Nena Polio, MD  amLODipine (NORVASC) 10 MG tablet Take 10 mg by mouth daily. 05/25/19  Yes [provider]  cinacalcet (SENSIPAR) 30 MG tablet Take 30 mg by mouth 3 (three) times a week. 09/04/15  Yes [provider]  gabapentin (NEURONTIN) 300 MG capsule Take 300 mg by mouth at bedtime.   Yes [provider]  metoprolol tartrate (LOPRESSOR) 25 MG tablet Take 12.5  mg by mouth 2 (two) times daily.   Yes [provider]  Multiple Vitamin (MULTIVITAMIN WITH MINERALS) TABS tablet Take 1 tablet by mouth daily.   Yes [provider]  oxyCODONE (OXY IR/ROXICODONE) 5 MG immediate release tablet Take 5 mg by mouth every 6 (six) hours as needed for severe pain.   Yes [provider]  sevelamer carbonate (RENVELA) 800 MG tablet Take 2,400 mg by mouth 3 (three) times daily. 09/22/19  Yes [provider]  tiZANidine (ZANAFLEX) 2 MG tablet Take  4 mg by mouth 3 (three) times daily. 10/15/19  Yes [provider]    Physical Exam: Vitals:   10/20/19 1730 10/20/19 1800 10/20/19 1900 10/20/19 1930  BP: (!) 156/83 (!) 163/81 (!) 158/82 (!) 152/80  Pulse: 79 79 77 79  Resp:   20   Temp:      TempSrc:      SpO2: 94% 93% 95% 95%  Weight:      Height:          Constitutional: Chronically ill looking and weak Vitals:   10/20/19 1730 10/20/19 1800 10/20/19 1900 10/20/19 1930  BP: (!) 156/83 (!) 163/81 (!) 158/82 (!) 152/80  Pulse: 79 79 77 79  Resp:   20   Temp:      TempSrc:      SpO2: 94% 93% 95% 95%  Weight:      Height:       Eyes: PERRL, lids and conjunctivae normal ENMT: Mucous membranes are moist. Posterior pharynx clear of any exudate or lesions.Normal dentition.  Neck: normal, supple, no masses, no thyromegaly Respiratory: Good air entry bilaterally with some mild basal crackles. Normal respiratory effort. No accessory muscle use.  Cardiovascular: Regular rate and rhythm, no murmurs / rubs / gallops. No extremity edema. 2+ pedal pulses. No carotid bruits.  Abdomen: no tenderness, no masses palpated. No hepatosplenomegaly. Bowel sounds positive.  Musculoskeletal: no clubbing / cyanosis. No joint deformity upper and lower extremities. Good ROM, no contractures. Normal muscle tone.  Skin: no rashes, lesions, ulcers. No induration Neurologic: CN 2-12 grossly intact. Sensation intact, DTR decreased bilaterally strength 4/5 in upper extremities and 5 out of 5 in the lower extremities respectively   Psychiatric: Normal judgment and insight. Alert and oriented x 3. Normal mood.     Labs on Admission: I have personally reviewed following labs and imaging studies  CBC: Recent Labs  Lab 10/20/19 1420  WBC 7.6  HGB 10.0*  HCT 31.5*  MCV 97.5  PLT 123456   Basic Metabolic Panel: Recent Labs  Lab 10/20/19 1420  NA 138  K 5.6*  CL 101  CO2 21*  GLUCOSE 82  BUN 41*  CREATININE 10.48*  CALCIUM 7.7*    GFR: Estimated Creatinine Clearance: 6.9 mL/min (A) (by C-G formula based on SCr of 10.48 mg/dL (H)). Liver Function Tests: No results for input(s): AST, ALT, ALKPHOS, BILITOT, PROT, ALBUMIN in the last 168 hours. No results for input(s): LIPASE, AMYLASE in the last 168 hours. No results for input(s): AMMONIA in the last 168 hours. Coagulation Profile: No results for input(s): INR, PROTIME in the last 168 hours. Cardiac Enzymes: No results for input(s): CKTOTAL, CKMB, CKMBINDEX, TROPONINI in the last 168 hours. BNP (last 3 results) No results for input(s): PROBNP in the last 8760 hours. HbA1C: No results for input(s): HGBA1C in the last 72 hours. CBG: No results for input(s): GLUCAP in the last 168 hours. Lipid Profile: No results for input(s): CHOL, HDL,  LDLCALC, TRIG, CHOLHDL, LDLDIRECT in the last 72 hours. Thyroid Function Tests: No results for input(s): TSH, T4TOTAL, FREET4, T3FREE, THYROIDAB in the last 72 hours. Anemia Panel: No results for input(s): VITAMINB12, FOLATE, FERRITIN, TIBC, IRON, RETICCTPCT in the last 72 hours. Urine analysis: No results found for: COLORURINE, APPEARANCEUR, LABSPEC, PHURINE, GLUCOSEU, HGBUR, BILIRUBINUR, KETONESUR, PROTEINUR, UROBILINOGEN, NITRITE, LEUKOCYTESUR Sepsis Labs: @LABRCNTIP (procalcitonin:4,lacticidven:4) ) Recent Results (from the past 240 hour(s))  Respiratory Panel by RT PCR (Flu A&B, Covid) - Nasopharyngeal Swab     Status: None   Collection Time: 10/20/19  6:31 PM   Specimen: Nasopharyngeal Swab  Result Value Ref Range Status   SARS Coronavirus 2 by RT PCR NEGATIVE NEGATIVE Final    Comment: (NOTE) SARS-CoV-2 target nucleic acids are NOT DETECTED. The SARS-CoV-2 RNA is generally detectable in upper respiratoy specimens during the acute phase of infection. The lowest concentration of SARS-CoV-2 viral copies this assay can detect is 131 copies/mL. A negative result does not preclude SARS-Cov-2 infection and should not be  used as the sole basis for treatment or other patient management decisions. A negative result may occur with  improper specimen collection/handling, submission of specimen other than nasopharyngeal swab, presence of viral mutation(s) within the areas targeted by this assay, and inadequate number of viral copies (<131 copies/mL). A negative result must be combined with clinical observations, patient history, and epidemiological information. The expected result is Negative. Fact Sheet for Patients:  PinkCheek.be Fact Sheet for Healthcare Providers:  GravelBags.it This test is not yet ap proved or cleared by the [place] FDA and  has been authorized for detection and/or diagnosis of SARS-CoV-2 by FDA under an Emergency Use Authorization (EUA). This EUA will remain  in effect (meaning this test can be used) for the duration of the COVID-19 declaration under Section 564(b)(1) of the Act, 21 U.S.C. section 360bbb-3(b)(1), unless the authorization is terminated or revoked sooner.    Influenza A by PCR NEGATIVE NEGATIVE Final   Influenza B by PCR NEGATIVE NEGATIVE Final    Comment: (NOTE) The Xpert Xpress SARS-CoV-2/FLU/RSV assay is intended as an aid in  the diagnosis of influenza from Nasopharyngeal swab specimens and  should not be used as a sole basis for treatment. Nasal washings and  aspirates are unacceptable for Xpert Xpress SARS-CoV-2/FLU/RSV  testing. Fact Sheet for Patients: PinkCheek.be Fact Sheet for Healthcare Providers: GravelBags.it This test is not yet approved or cleared by the [place] FDA and  has been authorized for detection and/or diagnosis of SARS-CoV-2 by  FDA under an Emergency Use Authorization (EUA). This EUA will remain  in effect (meaning this test can be used) for the duration of the  Covid-19 declaration under Section 564(b)(1) of the  Act, 21  U.S.C. section 360bbb-3(b)(1), unless the authorization is  terminated or revoked. Performed at [facility], [address] [place], [place] [address]      Radiological Exams on Admission: DG Abdomen 1 View  Result Date: 10/20/2019 CLINICAL DATA:  59 year old with absent bowel movements over the past 1-2 weeks. Current history of end-stage renal disease on hemodialysis. EXAM: ABDOMEN - 1 VIEW COMPARISON:  None. FINDINGS: Mild gaseous distention of the stomach. No evidence of small bowel or colonic distension. Moderate stool burden throughout the colon. Aortoiliofemoral atherosclerosis without evidence of aneurysm. Surgical clips in the RIGHT UPPER QUADRANT from prior cholecystectomy. Calcification projected over both kidneys are likely arterial rather than urinary tract calculi. IMPRESSION: 1. Mild gaseous distention of the stomach. Moderate colonic stool burden.  2. No acute abdominal abnormality otherwise. 3. Aortoiliofemoral atherosclerosis. Electronically Signed   By: Evangeline Dakin M.D.   On: 10/20/2019 17:04   CT CERVICAL SPINE WO CONTRAST  Result Date: 10/20/2019 CLINICAL DATA:  Neck pain, progressive cannot control limb EXAM: CT CERVICAL SPINE WITHOUT CONTRAST TECHNIQUE: Multidetector CT imaging of the cervical spine was performed without intravenous contrast. Multiplanar CT image reconstructions were also generated. COMPARISON:  MRI same day, MRI May 11, 2019, CT April 29, 2019 FINDINGS: Alignment: There is straightening of the normal cervical lordosis. Skull base and vertebrae: Visualized skull base is intact. No atlanto-occipital dissociation. There is a nondisplaced fracture seen through the right C6 transverse foramina which appears to traverse the vertebral canal. This is best seen on series 6, image 66. There is also slightly anteriorly perched facets bilaterally at C6-C7. Again noted is fragmentation with disc height loss and inferior migration of the C6  vertebral body on the C7 vertebral body with destructive endplate changes. This is unchanged from MRI same day, however new since prior CT of April 29, 2019. There is also progressive cystic lucency seen within the C4 vertebral body. Disc height loss with endplate changes are noted at C4-C5 and C5-C6. Soft tissues and spinal canal: Prevertebral soft tissue swelling is seen from C2 through C7. There appears to be focal canal stenosis at C6-C7. Disc levels: C1-C2: Atlanto-axial junction is normal, without canal narrowing C2-C3: No significant spinal canal or neural foraminal narrowing C3-C4: Disc osteophyte complex and uncovertebral osteophytes are present which causes moderate canal stenosis and severe neural foraminal narrowing. C4-C5: There is disc osteophyte complex and uncovertebral osteophytes which causes severe bilateral neural foraminal narrowing and moderate to severe central canal stenosis. C5-C6: Disc osteophyte complex and uncovertebral osteophytes are present which causes mild-to-moderate bilateral neural foraminal narrowing and mild central canal stenosis. C6-C7: There is fragmented endplate with small osseous fragment seen abutting the posterior thecal sac causing severe canal stenosis. Uncovertebral osteophytes with fragmented disc causes severe bilateral neural foraminal narrowing. C7-T1: No significant spinal canal or neural foraminal narrowing Upper chest: Mild centrilobular emphysematous changes seen at both lung apices. Thoracic inlet is within normal limits. Other: None IMPRESSION: 1. Nondisplaced fracture of the right C6 transverse foramina through the vertebral canal, age-indeterminate. Would however recommend CTA neck for further evaluation. 2. As on the MRI same day, new since 2020, endplate destructive changes with inferior migration of the C6 vertebral body and significant prevertebral soft tissue swelling, concerning for discitis/osteomyelitis. This causes severe canal and neural  foraminal stenosis. 3. Slightly perched facets at C6-C7. 4. These results were called by telephone at the time of interpretation on 10/20/2019 at 10:10 pm to provider [facility] , who verbally acknowledged these results. Electronically Signed   By: Prudencio Pair M.D.   On: 10/20/2019 22:12   MR CERVICAL SPINE WO CONTRAST  Result Date: 10/20/2019 CLINICAL DATA:  In stage renal disease in cervical myelopathy. Worsening weakness. EXAM: MRI CERVICAL SPINE WITHOUT CONTRAST TECHNIQUE: Multiplanar, multisequence MR imaging of the cervical spine was performed. No intravenous contrast was administered. COMPARISON:  07/16/2019 FINDINGS: Alignment: There is straightening of the normal cervical lordosis. No static subluxation. Vertebrae: There is new height loss at C6 and C7 with edema in the disc space. Cord: Normal signal and morphology. Posterior Fossa, vertebral arteries, paraspinal tissues: Lung prevertebral effusion stretches from C2-T1. Greatest thickness measures 6 mm. Disc levels: Axial images are degraded by motion, limiting assessment of the neural foramina. C2-3: No disc herniation or spinal  canal stenosis. C3-4: Right-greater-than-left facet hypertrophy with small disc bulge and right uncovertebral hypertrophy. Moderate spinal canal stenosis with severe bilateral neural foraminal stenosis. C4-5: Right asymmetric disc osteophyte complex with severe spinal canal stenosis and severe right, moderate left foraminal stenosis. C5-6: Small disc osteophyte complex with mild spinal canal stenosis. C6-7: Progressive disc narrowing with new endplate remodeling with central posterior spurring causing severe spinal canal stenosis. There is hyperintense T2-weighted signal within the disc space. IMPRESSION: Motion degraded study. 1. New height loss at C6 and C7 with hyperintense T2-weighted signal within the disc space, concerning for discitis-osteomyelitis or amyloid arthropathy. There is severe spinal canal stenosis at  this level with impingement on the spinal cord. 2. Prevertebral effusion measuring 6 mm in thickness that stretches from C2-T1. 3. Severe spinal canal stenosis at C4-5 and C6-7 with moderate spinal canal stenosis at C3-4. 4. Multilevel moderate to severe neural foraminal stenosis, greatest at bilateral C3-4 and right C4-5. Critical Value/emergent results were called by telephone at the time of interpretation on 10/20/2019 at 8:33 pm to provider Dr. Archie Balboa, who verbally acknowledged these results. Electronically Signed   By: Ulyses Jarred M.D.   On: 10/20/2019 20:38    EKG: Independently reviewed.  Normal sinus rhythm with no significant ST changes  Assessment/Plan Principal Problem:   Generalized weakness Active Problems:   Hyperkalemia   ESRD (end stage renal disease) (HCC)   Cervical myelopathy (HCC)     #1 weakness: More generalized.  Patient's power is 4 out of 5 upper extremities and 5 out of 5 in lower extremities.  Patient has also missed hemodialysis which could be contributing.  We will admit the patient.  PT OT.  Treat underlying causes.  Obtain neurosurgical consultation to continue with management of myelopathy.  #2 end-stage renal disease: Again patient has missed hemodialysis for the last 6 days.  Nephrology consult for hemodialysis.  Continue close monitoring.  #3 hyperkalemia: Secondary to missing hemodialysis.  This should be addressed with hemodialysis.  #4 cervical myelopathy: Worsening symptoms on MRI.  CT cervical spine being followed.  Patient to be seen and managed by neurosurgery according to previous plans.   DVT prophylaxis: Lovenox Code Status: Full code Family Communication: Discussed care with the patient fully Disposition Plan: To be determined Consults called: Neurosurgery Dr. Lacinda Axon, nephrology for hemodialysis Admission status: Inpatient  Severity of Illness: The appropriate patient status for this patient is INPATIENT. Inpatient status is judged to be  reasonable and necessary in order to provide the required intensity of service to ensure the patient's safety. The patient's presenting symptoms, physical exam findings, and initial radiographic and laboratory data in the context of their chronic comorbidities is felt to place them at high risk for further clinical deterioration. Furthermore, it is not anticipated that the patient will be medically stable for discharge from the hospital within 2 midnights of admission. The following factors support the patient status of inpatient.   " The patient's presenting symptoms include generalized weakness. " The worrisome physical exam findings include mild weakness of the upper extremities. " The initial radiographic and laboratory data are worrisome because of potassium 5.6 creatinine 2.48. " The chronic co-morbidities include cervical myelopathy and end-stage renal disease.   * I certify that at the point of admission it is my clinical judgment that the patient will require inpatient hospital care spanning beyond 2 midnights from the point of admission due to high intensity of service, high risk for further deterioration and high frequency of surveillance required.*  Barbette Merino MD [facility] Pager [phone]  If 7PM-7AM, please contact night-coverage [email or web address] Password [facility]  10/20/2019, 10:15 PM

## 2019-10-20 NOTE — ED Notes (Signed)
Pt states "I just need a shot in my neck for my spinal cord injury, it's very painful"

## 2019-10-20 NOTE — Consult Note (Addendum)
Referring Physician:  No referring provider defined for this encounter.  Primary Physician:  Patient, No Pcp Per  Chief Complaint:  falls  History of Present Illness: 10/20/2019 Cole Furrh is a 59 y.o. male who presents with the chief complaint of falls and inability to care for himself at home.  He lives alone.  He reports a fall, and that his symptoms have worsened.  He says he hasn't been to dialysis for a week because of instability.    He thinks his arms are weaker, and that his gait is less balanced. He also has multiple sores on his arms and doesn't know how they appeared.   10/11/19 from [facility] Landry Cook's note: Interval history Mr. Stroud returns today for follow-up after having previous been scheduled for cervical decompression and laminoplasty complicated by GI bleeding and need for additional medical treatment and hospitalization. He returns today and has been cleared after recent EGD and colonoscopy. He states he is taking less aspirin. He did see the pain clinic today and they did put him on a muscle relaxant and he states a neuropathic pain medication. He continues to have the pain in his neck going into the left shoulder and arm. He additionally keeps having more numbness and difficulty with his hands. He also feels like his balance is worse after recent fall.     Review of Systems:  A 10 point review of systems is negative, except for the pertinent positives and negatives detailed in the HPI.  Past Medical History: Past Medical History:  Diagnosis Date  . Bronchitis   . Hypertension   . Renal disorder    kidney failure, dialysis    Past Surgical History: Past Surgical History:  Procedure Laterality Date  . DIALYSIS FISTULA CREATION      Allergies: Allergies as of 10/20/2019  . (No Known Allergies)    Medications:  Current Facility-Administered Medications:  .  sodium chloride 0.9 % bolus 500 mL, 500 mL, Intravenous, Once, Carrie Mew, MD  Current  Outpatient Medications:  .  albuterol (PROVENTIL HFA;VENTOLIN HFA) 108 (90 Base) MCG/ACT inhaler, Inhale 2 puffs into the lungs every 6 (six) hours as needed for wheezing or shortness of breath., Disp: 1 Inhaler, Rfl: 2 .  amiodarone (PACERONE) 200 MG tablet, Take 1 tablet (200 mg total) by mouth 2 (two) times daily., Disp: 30 tablet, Rfl: 0 .  amLODipine (NORVASC) 10 MG tablet, Take 10 mg by mouth daily., Disp: , Rfl:  .  Budesonide 90 MCG/ACT inhaler, Inhale 1 puff into the lungs 2 (two) times daily., Disp: , Rfl:  .  butalbital-acetaminophen-caffeine (FIORICET) 50-325-40 MG tablet, Take 1 tablet by mouth every 6 (six) hours as needed for headache., Disp: 10 tablet, Rfl: 0 .  calcium acetate (PHOSLO) 667 MG capsule, Take 667 mg by mouth 3 (three) times daily with meals. , Disp: , Rfl:  .  cinacalcet (SENSIPAR) 30 MG tablet, Take 30 mg by mouth 3 (three) times a week., Disp: , Rfl:  .  fluticasone (FLONASE) 50 MCG/ACT nasal spray, Place 2 sprays into both nostrils daily., Disp: 16 g, Rfl: 2 .  gabapentin (NEURONTIN) 300 MG capsule, Take 1 capsule (300 mg total) by mouth 2 (two) times daily., Disp: 60 capsule, Rfl: 1 .  Multiple Vitamin (MULTIVITAMIN WITH MINERALS) TABS tablet, Take 1 tablet by mouth daily., Disp: , Rfl:  .  oxyCODONE-acetaminophen (PERCOCET) 5-325 MG tablet, Take 1 tablet by mouth every 4 (four) hours as needed for severe pain., Disp: 20 tablet,  Rfl: 0 .  pantoprazole (PROTONIX) 40 MG tablet, Take 1 tablet (40 mg total) by mouth daily., Disp: 30 tablet, Rfl: 0 .  traMADol (ULTRAM) 50 MG tablet, Take 1 tablet (50 mg total) by mouth every 6 (six) hours as needed., Disp: 15 tablet, Rfl: 0   Social History: Social History   Tobacco Use  . Smoking status: Current Every Day Smoker    Packs/day: 0.50    Types: Cigarettes  . Smokeless tobacco: Never Used  Substance Use Topics  . Alcohol use: Yes    Comment: rare  . Drug use: Yes    Types: Marijuana    Family Medical  History: Family History  Problem Relation Age of Onset  . Hypertension Mother     Physical Examination: Vitals:   10/20/19 1358  BP: (!) 158/78  Pulse: 81  Resp: 18  Temp: 98.7 F (37.1 C)  SpO2: 92%     General: Patient is in no apparent distress.   Psychiatric: Patient is anxious.  Head:  Pupils equal, round, and reactive to light.  ENT:  Oral mucosa appears well hydrated.  Neck:   Supple.  Full range of motion.  Respiratory: Patient is breathing without any difficulty.  Extremities: No edema.  Vascular: Palpable pulses in dorsal pedal vessels.  Skin:   On exposed skin, there are numerous skin lesions - some open.  NEUROLOGICAL:  General: In no acute distress.   Awake, alert, oriented to person, place, and time.  Pupils equal round and reactive to light.  Facial tone is symmetric.  Tongue protrusion is midline.  Does not perform pronator drift.  ROM of spine: full.  Palpation of spine: nontender.    Strength: Side Biceps Triceps Deltoid Interossei Grip Wrist Ext. Wrist Flex.  R 3 3 3  4- 4- 4- 4-  L 3 3 3  4- 4- 4- 4-   Side Iliopsoas Quads Hamstring PF DF EHL  R 5 5 5 5 5 5   L 5 5 5 5 5 5    Reflexes are 0 and symmetric at the biceps, triceps, brachioradialis, patella and achilles.   Bilateral upper and lower extremity sensation is intact to light touch and pin prick.  Clonus is present (2 beats). Gait is untested. Hoffman's is absent.  Imaging: C spine MRI 07/16/2019 IMPRESSION: 1. Nonspecific prevertebral signal abnormality has decreased but not resolved since September. There is some associated edema in the ventral longus coli muscles at C6. However, there are no osseous, disc, or other paraspinal soft tissue changes to suggest a spinal infection. Therefore, soft tissue injury or less likely a noninfectious inflammatory process (such as acute calcific prevertebral tendinitis) remains favored. 2. Underlying advanced cervical spine degeneration C3-C4  through C6-C7. Increased ligament flavum hypertrophy since September exacerbating spinal stenosis at those levels with mildly increased multilevel spinal cord mass effect. But no cord signal abnormality identified. 3. Associated moderate and severe C4 through C7 nerve level foraminal stenosis.   Electronically Signed   By: Genevie Ann M.D.   On: 07/17/2019 18:59  I have personally reviewed the images and agree with the above interpretation.  Labs: CBC Latest Ref Rng & Units 10/20/2019 09/28/2019 09/25/2019  WBC 4.0 - 10.5 K/uL 7.6 7.4 4.7  Hemoglobin 13.0 - 17.0 g/dL 10.0(L) 9.7(L) 9.2(L)  Hematocrit 39.0 - 52.0 % 31.5(L) 31.2(L) 28.4(L)  Platelets 150 - 400 K/uL 336 332 308       Assessment and Plan: Mr. Sarantos is a pleasant 59 y.o. male with cervical stenosis  and worsening clinical presentation.  He is medically noncompliant with clearance for surgery, and presents today with abnormal electrolytes and hyperkalemia.  Due to underlying medical disease and probable need for dialysis, I would recommend admission to medicine for consideration of dialysis and potential placement due to lack of safety.    Because of change in exam, I recommend repeating his mri.  I will order.  I will discuss his case with my partner, Cathleen Fears, who has previously directed his care.  I would be quite hesitant to operate for his myelopathy given the lack of medical clearance and level of noncompliance.   Camauri Fleece K. Izora Ribas MD, 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  He has been having symptoms for multiple days, and needs additional workup prior to consideration of intervention with decompression and washout.    I have ordered a cervical collar to be placed, and would like to obtain a sample of the prevertebral fluid or C6-7 disc aspiration to attempt to get an  organism.  Please consult infectious disease for input as well.

## 2019-10-20 NOTE — ED Notes (Signed)
Called MRI back to do pt screening questions and was told they would call back

## 2019-10-20 NOTE — ED Notes (Signed)
Report provided. Awaiting transport team.

## 2019-10-20 NOTE — ED Provider Notes (Signed)
[facility] Emergency Department Provider Note  ____________________________________________  Time seen: Approximately 6:27 PM  I have reviewed the triage vital signs and the nursing notes.   HISTORY  Chief Complaint Weakness    HPI Dayon Feehan is a 59 y.o. male with a history of hypertension, end-stage renal disease on hemodialysis, atrial fibrillation, CHF, cervical myelopathy managed by [facility] neurosurgery who comes the ED due to worsening weakness over the past 3 or 4 days.  Reports that he has not been to dialysis in 6 days.  Weakness is not lateralizing, no headache or vision changes.  He has chronic paresthesias in both hands which are unchanged.  Was supposed to go to [facility] for a preop medicine appointment today in anticipation of cervical spine surgery, they reports being unable to stand up and walk to his car.  He has had multiple falls at home.  Symptoms are constant, no aggravating or alleviating factors.  Denies any significant injury from falling.   He has longstanding secondary anuria.   Past Medical History:  Diagnosis Date  . Bronchitis   . Hypertension   . Renal disorder    kidney failure, dialysis     Patient Active Problem List   Diagnosis Date Noted  . Symptomatic anemia 07/26/2019  . ESRD (end stage renal disease) ([place]) 07/26/2019  . Neck pain 07/26/2019  . Melena 07/26/2019  . Hx of atrial fibrillation without current medication 07/26/2019  . Atrial fibrillation with rapid ventricular response ([place]) 04/30/2019  . Syncope 04/29/2019  . Acute CHF (congestive heart failure) ([place]) 02/14/2019  . Acute encephalopathy 11/23/2018  . Influenza B 11/21/2018  . Pulmonary edema 09/22/2018  . Hyperkalemia 05/16/2018     Past Surgical History:  Procedure Laterality Date  . DIALYSIS FISTULA CREATION       Prior to Admission medications   Medication Sig Start Date End Date Taking? Authorizing Provider  albuterol (PROVENTIL HFA;VENTOLIN  HFA) 108 (90 Base) MCG/ACT inhaler Inhale 2 puffs into the lungs every 6 (six) hours as needed for wheezing or shortness of breath. 11/19/18   Nena Polio, MD  amiodarone (PACERONE) 200 MG tablet Take 1 tablet (200 mg total) by mouth 2 (two) times daily. 05/13/19   Epifanio Lesches, MD  amLODipine (NORVASC) 10 MG tablet Take 10 mg by mouth daily. 05/25/19   [provider]  Budesonide 90 MCG/ACT inhaler Inhale 1 puff into the lungs 2 (two) times daily. 04/23/19 04/22/20  [provider]  butalbital-acetaminophen-caffeine (FIORICET) 50-325-40 MG tablet Take 1 tablet by mouth every 6 (six) hours as needed for headache. 05/20/19 05/19/20  Lilia Pro., MD  calcium acetate (PHOSLO) 667 MG capsule Take 667 mg by mouth 3 (three) times daily with meals.     [provider]  cinacalcet (SENSIPAR) 30 MG tablet Take 30 mg by mouth 3 (three) times a week. 09/04/15   [provider]  fluticasone (FLONASE) 50 MCG/ACT nasal spray Place 2 sprays into both nostrils daily. 04/05/19   Dustin Flock, MD  Multiple Vitamin (MULTIVITAMIN WITH MINERALS) TABS tablet Take 1 tablet by mouth daily.    [provider]  oxyCODONE-acetaminophen (PERCOCET) 5-325 MG tablet Take 1 tablet by mouth every 4 (four) hours as needed for severe pain. 09/28/19   Harvest Dark, MD  pantoprazole (PROTONIX) 40 MG tablet Take 1 tablet (40 mg total) by mouth daily. 04/05/19   Dustin Flock, MD  traMADol (ULTRAM) 50 MG tablet Take 1 tablet (50 mg total) by mouth every  6 (six) hours as needed. 09/25/19   Johnn Hai, PA-C     Allergies Patient has no known allergies.   Family History  Problem Relation Age of Onset  . Hypertension Mother     Social History Social History   Tobacco Use  . Smoking status: Current Every Day Smoker    Packs/day: 0.50    Types: Cigarettes  . Smokeless tobacco: Never Used  Substance Use Topics  . Alcohol use: Yes    Comment: rare  . Drug use:  Yes    Types: Marijuana    Review of Systems  Constitutional:   No fever or chills.  ENT:   No sore throat. No rhinorrhea. Cardiovascular:   No chest pain or syncope. Respiratory:   No dyspnea or cough. Gastrointestinal:   Negative for abdominal pain, vomiting and diarrhea.  Musculoskeletal:   Negative for focal pain or swelling All other systems reviewed and are negative except as documented above in ROS and HPI.  ____________________________________________   PHYSICAL EXAM:  VITAL SIGNS: ED Triage Vitals  Enc Vitals Group     BP 10/20/19 1358 (!) 158/78     Pulse Rate 10/20/19 1358 81     Resp 10/20/19 1358 18     Temp 10/20/19 1358 98.7 F (37.1 C)     Temp Source 10/20/19 1358 Oral     SpO2 10/20/19 1358 92 %     Weight 10/20/19 1356 140 lb (63.5 kg)     Height 10/20/19 1356 5\' 6"  (1.676 m)     Head Circumference --      Peak Flow --      Pain Score 10/20/19 1356 10     Pain Loc --      Pain Edu? --      Excl. in [place]? --     Vital signs reviewed, nursing assessments reviewed.   Constitutional:   Alert and oriented. Non-toxic appearance. Eyes:   Conjunctivae are normal. EOMI. PERRL. ENT      Head:   Normocephalic and atraumatic.      Nose: Normal      Mouth/Throat:   Dry mucous membranes      Neck:   No meningismus. Full ROM. Hematological/Lymphatic/Immunilogical:   No cervical lymphadenopathy. Cardiovascular:   RRR. Symmetric bilateral radial and DP pulses.  No murmurs. Cap refill less than 2 seconds.  Left upper extremity AV fistula with strong thrill Respiratory:   Normal respiratory effort without tachypnea/retractions. Breath sounds are clear and equal bilaterally. No wheezes/rales/rhonchi. Gastrointestinal:   Soft and nontender. Non distended. There is no CVA tenderness.  No rebound, rigidity, or guarding.  Musculoskeletal:   Normal range of motion in all extremities. No joint effusions.  No lower extremity tenderness.  No edema. Neurologic:   Normal  speech and language.  Motor grossly intact. No acute focal neurologic deficits are appreciated.  Skin:   Multiple scattered skin lesions throughout the body, various stages of healing.  No inflammatory or infectious skin findings.  ____________________________________________    LABS (pertinent positives/negatives) (all labs ordered are listed, but only abnormal results are displayed) Labs Reviewed  BASIC METABOLIC PANEL - Abnormal; Notable for the following components:      Result Value   Potassium 5.6 (*)    CO2 21 (*)    BUN 41 (*)    Creatinine, Ser 10.48 (*)    Calcium 7.7 (*)    GFR calc non Af Amer 5 (*)    GFR calc Af  Amer 6 (*)    Anion gap 16 (*)    All other components within normal limits  CBC - Abnormal; Notable for the following components:   RBC 3.23 (*)    Hemoglobin 10.0 (*)    HCT 31.5 (*)    RDW 17.1 (*)    All other components within normal limits  RESPIRATORY PANEL BY RT PCR (FLU A&B, COVID)  CBG MONITORING, ED   ____________________________________________   EKG  Interpreted by me Normal sinus rhythm rate of 81, normal axis and intervals.  LVH with slight repolarization abnormality.  No acute ischemic changes.  ____________________________________________    RADIOLOGY  DG Abdomen 1 View  Result Date: 10/20/2019 CLINICAL DATA:  59 year old with absent bowel movements over the past 1-2 weeks. Current history of end-stage renal disease on hemodialysis. EXAM: ABDOMEN - 1 VIEW COMPARISON:  None. FINDINGS: Mild gaseous distention of the stomach. No evidence of small bowel or colonic distension. Moderate stool burden throughout the colon. Aortoiliofemoral atherosclerosis without evidence of aneurysm. Surgical clips in the RIGHT UPPER QUADRANT from prior cholecystectomy. Calcification projected over both kidneys are likely arterial rather than urinary tract calculi. IMPRESSION: 1. Mild gaseous distention of the stomach. Moderate colonic stool burden. 2. No  acute abdominal abnormality otherwise. 3. Aortoiliofemoral atherosclerosis. Electronically Signed   By: Evangeline Dakin M.D.   On: 10/20/2019 17:04    ____________________________________________   PROCEDURES Procedures  ____________________________________________    CLINICAL IMPRESSION / ASSESSMENT AND PLAN / ED COURSE  Medications ordered in the ED: Medications  sodium zirconium cyclosilicate (LOKELMA) packet 10 g (has no administration in time range)  oxyCODONE (OXYCONTIN) 12 hr tablet 10 mg (has no administration in time range)  sodium chloride 0.9 % bolus 500 mL (500 mLs Intravenous New Bag/Given 10/20/19 1807)    Pertinent labs & imaging results that were available during my care of the patient were reviewed by me and considered in my medical decision making (see chart for details).  Juliani Vanengen was evaluated in Emergency Department on 10/20/2019 for the symptoms described in the history of present illness. He was evaluated in the context of the global COVID-19 pandemic, which necessitated consideration that the patient might be at risk for infection with the SARS-CoV-2 virus that causes COVID-19. Institutional protocols and algorithms that pertain to the evaluation of patients at risk for COVID-19 are in a state of rapid change based on information released by regulatory bodies including the CDC and federal and state organizations. These policies and algorithms were followed during the patient's care in the ED.   Patient presents with generalized weakness in the setting of missed dialysis.  Vital signs are unremarkable.  Exam is nonfocal.  EKG and initial labs are unremarkable with a potassium of 5.6.  Discussed with neurosurgery who will evaluate.   ----------------------------------------- 6:00 PM on 10/20/2019 ----------------------------------------- Case discussed with Dr. Cari Caraway in the ED after his evaluation of the patient, notes that the exam does appear to be worse  compared to recent clinic visit a week ago, but due to the patient's compliance issues and comorbidities the patient is very high risk surgically and has been difficult to get preop medical optimization done.  Recommends repeating MRI cervical spine to ensure no progression of disease or new injury.  With patient's generalized weakness and missed dialysis even though labs are not alarming, I will give a dose of low, and plan to hospitalize for further management.       ____________________________________________   FINAL CLINICAL IMPRESSION(S) /  ED DIAGNOSES    Final diagnoses:  Generalized weakness  ESRD on hemodialysis (HCC)  Cervical myelopathy (HCC)  Paroxysmal atrial fibrillation (HCC)  Chronic congestive heart failure, unspecified heart failure type [facility])     ED Discharge Orders    None      Portions of this note were generated with dragon dictation software. Dictation errors may occur despite best attempts at proofreading.   Carrie Mew, MD 10/20/19 1836

## 2019-10-21 DIAGNOSIS — Z992 Dependence on renal dialysis: Secondary | ICD-10-CM

## 2019-10-21 DIAGNOSIS — R531 Weakness: Secondary | ICD-10-CM

## 2019-10-21 DIAGNOSIS — E875 Hyperkalemia: Secondary | ICD-10-CM

## 2019-10-21 DIAGNOSIS — N186 End stage renal disease: Secondary | ICD-10-CM

## 2019-10-21 DIAGNOSIS — M4642 Discitis, unspecified, cervical region: Principal | ICD-10-CM

## 2019-10-21 LAB — CBC
HCT: 29.5 % — ABNORMAL LOW (ref 39.0–52.0)
Hemoglobin: 9.2 g/dL — ABNORMAL LOW (ref 13.0–17.0)
MCH: 30.7 pg (ref 26.0–34.0)
MCHC: 31.2 g/dL (ref 30.0–36.0)
MCV: 98.3 fL (ref 80.0–100.0)
Platelets: 291 10*3/uL (ref 150–400)
RBC: 3 MIL/uL — ABNORMAL LOW (ref 4.22–5.81)
RDW: 17.2 % — ABNORMAL HIGH (ref 11.5–15.5)
WBC: 6 10*3/uL (ref 4.0–10.5)
nRBC: 0 % (ref 0.0–0.2)

## 2019-10-21 LAB — RENAL FUNCTION PANEL
Albumin: 2.5 g/dL — ABNORMAL LOW (ref 3.5–5.0)
Anion gap: 14 (ref 5–15)
BUN: 48 mg/dL — ABNORMAL HIGH (ref 6–20)
CO2: 22 mmol/L (ref 22–32)
Calcium: 7.1 mg/dL — ABNORMAL LOW (ref 8.9–10.3)
Chloride: 104 mmol/L (ref 98–111)
Creatinine, Ser: 11.27 mg/dL — ABNORMAL HIGH (ref 0.61–1.24)
GFR calc Af Amer: 5 mL/min — ABNORMAL LOW (ref >60)
GFR calc non Af Amer: 4 mL/min — ABNORMAL LOW (ref >60)
Glucose, Bld: 72 mg/dL (ref 70–99)
Phosphorus: 6.9 mg/dL — ABNORMAL HIGH (ref 2.5–4.6)
Potassium: 5.3 mmol/L — ABNORMAL HIGH (ref 3.5–5.1)
Sodium: 140 mmol/L (ref 135–145)

## 2019-10-21 LAB — PROTIME-INR
INR: 1.2 (ref 0.8–1.2)
Prothrombin Time: 15.4 seconds — ABNORMAL HIGH (ref 11.4–15.2)

## 2019-10-21 LAB — HEPATITIS B SURFACE ANTIGEN: Hepatitis B Surface Ag: NONREACTIVE

## 2019-10-21 LAB — HEPATITIS B CORE ANTIBODY, IGM: Hep B C IgM: NONREACTIVE

## 2019-10-21 MED ORDER — CHLORHEXIDINE GLUCONATE CLOTH 2 % EX PADS
6.0000 | MEDICATED_PAD | Freq: Every day | CUTANEOUS | Status: DC
  Administered 2019-10-21: 6 via TOPICAL

## 2019-10-21 MED ORDER — LABETALOL HCL 5 MG/ML IV SOLN
10.0000 mg | Freq: Four times a day (QID) | INTRAVENOUS | Status: DC | PRN
  Administered 2019-10-21: 5 mg via INTRAVENOUS
  Filled 2019-10-21 (×2): qty 4

## 2019-10-21 NOTE — Progress Notes (Signed)
Pt refusing IV placement for pending CT scan. Importance of CT scan explained to patient, who is not receptive to education at this time. Covering B. Randol Kern NP made aware. CT department aware.

## 2019-10-21 NOTE — Progress Notes (Signed)
D/c report given to RN Aaron Edelman in [facility]. All paper work given to EMS

## 2019-10-21 NOTE — Progress Notes (Signed)
VAT: Responded to bedside for a difficult PIV start  Consult. Patient refused for PIV start at the moment. Became upset with RN when tried to explain the reason behind the need for a new PIV. Primary RN made aware. Will complete this order.

## 2019-10-21 NOTE — Progress Notes (Signed)
Hemodialysis patient known at [facility] MWF 7:00, patient normally drives self. Per clinic patient came on Monday for treatment but required a wheelchair due to weakness, patient called Wednesday to say that he could not make it to treatment. Clinic tried to reschedule however patient declined. Please contact me with any change in patient status that may effect normal dialysis schedule.

## 2019-10-21 NOTE — Progress Notes (Signed)
[place], [place] 10/21/19  Subjective:   Hospital day # 1  Patient known to our practice from outpatient dialysis Missed his HD on Saturday Came in for neck pain, falls Seen during HD. BP is elevated, otherwise tolerating well Offering very little information   HEMODIALYSIS FLOWSHEET:  Blood Flow Rate (mL/min): 400 mL/min Arterial Pressure (mmHg): -130 mmHg Venous Pressure (mmHg): 210 mmHg Transmembrane Pressure (mmHg): 60 mmHg Ultrafiltration Rate (mL/min): 150 mL/min Dialysate Flow Rate (mL/min): 600 ml/min Conductivity: Machine : 14 Conductivity: Machine : 14 Dialysis Fluid Bolus: Normal Saline Bolus Amount (mL): 250 mL Dialysate Change: 2K    Objective:  Vital signs in last 24 hours:  Temp:  [97.2 F (36.2 C)-98.8 F (37.1 C)] 97.2 F (36.2 C) (02/12 1218) Pulse Rate:  [77-85] 82 (02/12 1200) Resp:  [11-29] 13 (02/12 1200) BP: (139-184)/(75-129) 173/85 (02/12 1200) SpO2:  [89 %-100 %] 100 % (02/12 1200) Weight:  [59.8 kg-63.5 kg] 59.8 kg (02/12 0500)  Weight change:  Filed Weights   10/20/19 1356 10/20/19 2245 10/21/19 0500  Weight: 63.5 kg 60.1 kg 59.8 kg    Intake/Output:    Intake/Output Summary (Last 24 hours) at 10/21/2019 1319 Last data filed at 10/21/2019 1218 Gross per 24 hour  Intake 500 ml  Output -55 ml  Net 555 ml     Physical Exam: General: NAD, laying in bed  HEENT Moist oral mucus membranes  Pulm/lungs Normal effort, clear to auscultation  CVS/Heart No rub, regular  Abdomen:  Soft, NT  Extremities: No edema  Neurologic: Alert, answers few questions  Skin: warm  Access: AVF       Basic Metabolic Panel:  Recent Labs  Lab 10/20/19 1420 10/21/19 0754  NA 138 140  K 5.6* 5.3*  CL 101 104  CO2 21* 22  GLUCOSE 82 72  BUN 41* 48*  CREATININE 10.48* 11.27*  CALCIUM 7.7* 7.1*  PHOS  --  6.9*     CBC: Recent Labs  Lab 10/20/19 1420 10/21/19 0754  WBC 7.6 6.0  HGB 10.0* 9.2*  HCT  31.5* 29.5*  MCV 97.5 98.3  PLT 336 291      Lab Results  Component Value Date   HEPBSAG Negative 05/18/2018   HEPBSAB Reactive 05/18/2018      Microbiology:  Recent Results (from the past 240 hour(s))  Respiratory Panel by RT PCR (Flu A&B, Covid) - Nasopharyngeal Swab     Status: None   Collection Time: 10/20/19  6:31 PM   Specimen: Nasopharyngeal Swab  Result Value Ref Range Status   SARS Coronavirus 2 by RT PCR NEGATIVE NEGATIVE Final    Comment: (NOTE) SARS-CoV-2 target nucleic acids are NOT DETECTED. The SARS-CoV-2 RNA is generally detectable in upper respiratoy specimens during the acute phase of infection. The lowest concentration of SARS-CoV-2 viral copies this assay can detect is 131 copies/mL. A negative result does not preclude SARS-Cov-2 infection and should not be used as the sole basis for treatment or other patient management decisions. A negative result may occur with  improper specimen collection/handling, submission of specimen other than nasopharyngeal swab, presence of viral mutation(s) within the areas targeted by this assay, and inadequate number of viral copies (<131 copies/mL). A negative result must be combined with clinical observations, patient history, and epidemiological information. The expected result is Negative. Fact Sheet for Patients:  PinkCheek.be Fact Sheet for Healthcare Providers:  GravelBags.it This test is not yet ap proved or cleared by the [place] and  has been authorized for detection and/or diagnosis of SARS-CoV-2 by FDA under an Emergency Use Authorization (EUA). This EUA will remain  in effect (meaning this test can be used) for the duration of the COVID-19 declaration under Section 564(b)(1) of the Act, 21 U.S.C. section 360bbb-3(b)(1), unless the authorization is terminated or revoked sooner.    Influenza A by PCR NEGATIVE NEGATIVE Final   Influenza B by  PCR NEGATIVE NEGATIVE Final    Comment: (NOTE) The Xpert Xpress SARS-CoV-2/FLU/RSV assay is intended as an aid in  the diagnosis of influenza from Nasopharyngeal swab specimens and  should not be used as a sole basis for treatment. Nasal washings and  aspirates are unacceptable for Xpert Xpress SARS-CoV-2/FLU/RSV  testing. Fact Sheet for Patients: PinkCheek.be Fact Sheet for Healthcare Providers: GravelBags.it This test is not yet approved or cleared by the [place] FDA and  has been authorized for detection and/or diagnosis of SARS-CoV-2 by  FDA under an Emergency Use Authorization (EUA). This EUA will remain  in effect (meaning this test can be used) for the duration of the  Covid-19 declaration under Section 564(b)(1) of the Act, 21  U.S.C. section 360bbb-3(b)(1), unless the authorization is  terminated or revoked. Performed at [facility], [address] [place], [place] [address]   Blood culture (routine x 2)     Status: None (Preliminary result)   Collection Time: 10/20/19 11:16 PM   Specimen: BLOOD  Result Value Ref Range Status   Specimen Description BLOOD RIGHT ASSIST CONTROL  Final   Special Requests   Final    BOTTLES DRAWN AEROBIC AND ANAEROBIC Blood Culture results may not be optimal due to an excessive volume of blood received in culture bottles   Culture   Final    NO GROWTH < 12 HOURS Performed at [facility], [address] [place], [place] [address]    Report Status PENDING  Incomplete  Blood culture (routine x 2)     Status: None (Preliminary result)   Collection Time: 10/20/19 11:21 PM   Specimen: BLOOD  Result Value Ref Range Status   Specimen Description BLOOD RIGHT WRIST  Final   Special Requests   Final    BOTTLES DRAWN AEROBIC AND ANAEROBIC Blood Culture adequate volume   Culture   Final    NO GROWTH < 12 HOURS Performed at [facility], [address] [place], [place] [address]    Report Status PENDING  Incomplete    Coagulation Studies: No results for input(s): LABPROT, INR in the last 72 hours.  Urinalysis: No results for input(s): COLORURINE, LABSPEC, PHURINE, GLUCOSEU, HGBUR, BILIRUBINUR, KETONESUR, PROTEINUR, UROBILINOGEN, NITRITE, LEUKOCYTESUR in the last 72 hours.  Invalid input(s): APPERANCEUR    Imaging: DG Abdomen 1 View  Result Date: 10/20/2019 CLINICAL DATA:  59 year old with absent bowel movements over the past 1-2 weeks. Current history of end-stage renal disease on hemodialysis. EXAM: ABDOMEN - 1 VIEW COMPARISON:  None. FINDINGS: Mild gaseous distention of the stomach. No evidence of small bowel or colonic distension. Moderate stool burden throughout the colon. Aortoiliofemoral atherosclerosis without evidence of aneurysm. Surgical clips in the RIGHT UPPER QUADRANT from prior cholecystectomy. Calcification projected over both kidneys are likely arterial rather than urinary tract calculi. IMPRESSION: 1. Mild gaseous distention of the stomach. Moderate colonic stool burden. 2. No acute abdominal abnormality otherwise. 3. Aortoiliofemoral atherosclerosis. Electronically Signed   By: Evangeline Dakin M.D.   On: 10/20/2019 17:04   CT CERVICAL SPINE WO CONTRAST  Result Date: 10/20/2019 CLINICAL DATA:  Neck pain, progressive cannot control limb EXAM: CT CERVICAL SPINE WITHOUT CONTRAST TECHNIQUE: Multidetector CT imaging of the cervical spine was performed without intravenous contrast. Multiplanar CT image reconstructions were also generated. COMPARISON:  MRI same day, MRI May 11, 2019, CT April 29, 2019 FINDINGS: Alignment: There is straightening of the normal cervical lordosis. Skull base and vertebrae: Visualized skull base is intact. No atlanto-occipital dissociation. There is a nondisplaced fracture seen through the right C6 transverse foramina which appears to traverse the vertebral canal. This is best seen on series 6,  image 66. There is also slightly anteriorly perched facets bilaterally at C6-C7. Again noted is fragmentation with disc height loss and inferior migration of the C6 vertebral body on the C7 vertebral body with destructive endplate changes. This is unchanged from MRI same day, however new since prior CT of April 29, 2019. There is also progressive cystic lucency seen within the C4 vertebral body. Disc height loss with endplate changes are noted at C4-C5 and C5-C6. Soft tissues and spinal canal: Prevertebral soft tissue swelling is seen from C2 through C7. There appears to be focal canal stenosis at C6-C7. Disc levels: C1-C2: Atlanto-axial junction is normal, without canal narrowing C2-C3: No significant spinal canal or neural foraminal narrowing C3-C4: Disc osteophyte complex and uncovertebral osteophytes are present which causes moderate canal stenosis and severe neural foraminal narrowing. C4-C5: There is disc osteophyte complex and uncovertebral osteophytes which causes severe bilateral neural foraminal narrowing and moderate to severe central canal stenosis. C5-C6: Disc osteophyte complex and uncovertebral osteophytes are present which causes mild-to-moderate bilateral neural foraminal narrowing and mild central canal stenosis. C6-C7: There is fragmented endplate with small osseous fragment seen abutting the posterior thecal sac causing severe canal stenosis. Uncovertebral osteophytes with fragmented disc causes severe bilateral neural foraminal narrowing. C7-T1: No significant spinal canal or neural foraminal narrowing Upper chest: Mild centrilobular emphysematous changes seen at both lung apices. Thoracic inlet is within normal limits. Other: None IMPRESSION: 1. Nondisplaced fracture of the right C6 transverse foramina through the vertebral canal, age-indeterminate. Would however recommend CTA neck for further evaluation. 2. As on the MRI same day, new since 2020, endplate destructive changes with inferior  migration of the C6 vertebral body and significant prevertebral soft tissue swelling, concerning for discitis/osteomyelitis. This causes severe canal and neural foraminal stenosis. 3. Slightly perched facets at C6-C7. 4. These results were called by telephone at the time of interpretation on 10/20/2019 at 10:10 pm to provider [facility] , who verbally acknowledged these results. Electronically Signed   By: Prudencio Pair M.D.   On: 10/20/2019 22:12   MR CERVICAL SPINE WO CONTRAST  Result Date: 10/20/2019 CLINICAL DATA:  In stage renal disease in cervical myelopathy. Worsening weakness. EXAM: MRI CERVICAL SPINE WITHOUT CONTRAST TECHNIQUE: Multiplanar, multisequence MR imaging of the cervical spine was performed. No intravenous contrast was administered. COMPARISON:  07/16/2019 FINDINGS: Alignment: There is straightening of the normal cervical lordosis. No static subluxation. Vertebrae: There is new height loss at C6 and C7 with edema in the disc space. Cord: Normal signal and morphology. Posterior Fossa, vertebral arteries, paraspinal tissues: Lung prevertebral effusion stretches from C2-T1. Greatest thickness measures 6 mm. Disc levels: Axial images are degraded by motion, limiting assessment of the neural foramina. C2-3: No disc herniation or spinal canal stenosis. C3-4: Right-greater-than-left facet hypertrophy with small disc bulge and right uncovertebral hypertrophy. Moderate spinal canal stenosis with severe bilateral neural foraminal stenosis. C4-5: Right asymmetric disc osteophyte complex with  severe spinal canal stenosis and severe right, moderate left foraminal stenosis. C5-6: Small disc osteophyte complex with mild spinal canal stenosis. C6-7: Progressive disc narrowing with new endplate remodeling with central posterior spurring causing severe spinal canal stenosis. There is hyperintense T2-weighted signal within the disc space. IMPRESSION: Motion degraded study. 1. New height loss at C6 and C7  with hyperintense T2-weighted signal within the disc space, concerning for discitis-osteomyelitis or amyloid arthropathy. There is severe spinal canal stenosis at this level with impingement on the spinal cord. 2. Prevertebral effusion measuring 6 mm in thickness that stretches from C2-T1. 3. Severe spinal canal stenosis at C4-5 and C6-7 with moderate spinal canal stenosis at C3-4. 4. Multilevel moderate to severe neural foraminal stenosis, greatest at bilateral C3-4 and right C4-5. Critical Value/emergent results were called by telephone at the time of interpretation on 10/20/2019 at 8:33 pm to provider Dr. Archie Balboa, who verbally acknowledged these results. Electronically Signed   By: Ulyses Jarred M.D.   On: 10/20/2019 20:38     Medications:    . Chlorhexidine Gluconate Cloth  6 each Topical Q0600  . oxyCODONE  10 mg Oral Q12H   acetaminophen **OR** acetaminophen, calcium carbonate, camphor-menthol **AND** hydrOXYzine, feeding supplement (NEPRO CARB STEADY), labetalol, ondansetron **OR** ondansetron (ZOFRAN) IV, sorbitol  Assessment/ Plan:  59 y.o. male with  end stage renal disease on hemodialysis, hypertension, congestive heart failure, diabetes mellitus type II, history of substance abuse, COPD  admitted on 10/20/2019 for Cervical myelopathy (HCC) [G95.9] Paroxysmal atrial fibrillation (HCC) [I48.0] Generalized weakness [R53.1] ESRD on hemodialysis ([place]) [N18.6, Z99.2] Chronic congestive heart failure, unspecified heart failure type [facility]) [I50.9]  [facility]/ MWF-3/TW/62kg   #ESRD #Anemia of CKD #SHPTH #Hyperkalemia #prevertebral effusion/Osteomyelitis/Discitis  Seen during HD today. Tolerating well. Potassium is expected to correct with HD Patient is being transferred to [facility] for further care     LOS: [place] 2/12/20211:19 PM  [place], [place]  Note: This note was prepared with Dragon dictation.  Any transcription errors are unintentional

## 2019-10-21 NOTE — Discharge Summary (Signed)
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 Bronchitis   . Hypertension   . Renal disorder    kidney failure, dialysis    HOSPITAL COURSE:  Jeremy [name] is a 59 y.o. male with medical history significant of end-stage renal disease on hemodialysis who has missed dialysis for the last 6 days, cervical myelopathy, hypertension, medication noncompliance atrial fibrillation, diastolic dysfunction CHF who came in with generalized weakness.  He has progressively getting more weak in the last week.  He again has had no dialysis in 6 days.  Patient had complained also of worsening weakness in his upper and lower extremities  #1 progressive worsening upper and lower extremity weakness:  -More generalized.  Patient's power is 4 out of 5 upper extremities and 5 out of 5 in lower extremities.   -patient unable to ambulate or drive his car in the last week. He denies any fall or injury -patient was evaluated by Dr. Cari Caraway neurosurgery. MRI cervical spine and CT cervical spine shows new C67 loss of height with pre-vertebral effusion extending up to T2 worrisome for osteomyelitis/Discitis -patient also has severe spinal stenosis along with impingement of the cord in this area with endplate destruction of the vertebrae -neurosurgery recommended IR  aspirate-- discussed with IR at [facility] who informed cervical spine is a risky area and they do not perform those procedure. -Neurosurgery Dr. Cari Caraway recommend patient be transferred to [facility] -patient has been accepted at [facility] Patient has been informed. Risk and benefit of transfer has been discussed with patient and his sister Jeremy [name] on the phone  #2 end-stage renal disease: patient missed last dialysis session secondary to weakness unable to ambulate -  Nephrology consult for hemodialysis.  -Completed dialysis today  #3 hyperkalemia: Secondary to missing hemodialysis.  This should be addressed with hemodialysis. -Potassium 5.3  #4 cervical myelopathy:  as above  #5 hypertension continue home meds  DVT prophylaxis: Lovenox Code Status: Full code Family Communication: Discussed care with the patient fully. Discussed with sister Jeremy [name] on the phone. She resides in [place] Disposition Plan:  to be transferred to [place] called: Neurosurgery Dr. Lacinda Axon, nephrology for hemodialysis    CONSULTS OBTAINED:  Treatment Team:  Murlean Iba, MD Tsosie Billing, MD  DRUG ALLERGIES:  No Known Allergies  DISCHARGE MEDICATIONS:   Allergies as of 10/21/2019   No Known Allergies     Medication List    TAKE these medications   albuterol 108 (90 Base) MCG/ACT inhaler Commonly known as: VENTOLIN HFA Inhale 2 puffs into the lungs every 6 (six) hours as needed for wheezing or shortness of breath.   amLODipine 10 MG tablet Commonly known as: NORVASC Take 10 mg by mouth daily.   cinacalcet 30 MG tablet Commonly known as: SENSIPAR Take 30 mg by mouth 3 (three) times a week.   gabapentin 300 MG capsule Commonly known as: NEURONTIN  Take 300 mg by mouth at bedtime.   metoprolol tartrate 25 MG tablet Commonly known as: LOPRESSOR Take 12.5 mg by mouth 2 (two) times daily.   multivitamin with minerals Tabs tablet Take 1 tablet by mouth  daily.   oxyCODONE 5 MG immediate release tablet Commonly known as: Oxy IR/ROXICODONE Take 5 mg by mouth every 6 (six) hours as needed for severe pain.   sevelamer carbonate 800 MG tablet Commonly known as: RENVELA Take 2,400 mg by mouth 3 (three) times daily.   tiZANidine 2 MG tablet Commonly known as: ZANAFLEX Take 4 mg by mouth 3 (three) times daily.       If you experience worsening of your admission symptoms, develop shortness of breath, life threatening emergency, suicidal or homicidal thoughts you must seek medical attention immediately by calling 911 or calling your MD immediately  if symptoms less severe.  You Must read complete instructions/literature along with all the possible adverse reactions/side effects for all the Medicines you take and that have been prescribed to you. Take any new Medicines after you have completely understood and accept all the possible adverse reactions/side effects.   Please note  You were cared for by a hospitalist during your hospital stay. If you have any questions about your discharge medications or the care you received while you were in the hospital after you are discharged, you can call the unit and asked to speak with the hospitalist on call if the hospitalist that took care of you is not available. Once you are discharged, your primary care physician will handle any further medical issues. Please note that NO REFILLS for any discharge medications will be authorized once you are discharged, as it is imperative that you return to your primary care physician (or establish a relationship with a primary care physician if you do not have one) for your aftercare needs so that they can reassess your need for medications and monitor your lab values. Today   SUBJECTIVE   [facility]NET hemodialysis. Complains of neck pain and weakness worsening in both upper and lower extremity  VITAL SIGNS:  Blood pressure (!) 173/85, pulse 82, temperature (!) 97.2 F (36.2  C), temperature source Oral, resp. rate 13, height 5\' 6"  (1.676 m), weight 59.8 kg, SpO2 100 %.  I/O:    Intake/Output Summary (Last 24 hours) at 10/21/2019 1255 Last data filed at 10/21/2019 1218 Gross per 24 hour  Intake 500 ml  Output -55 ml  Net 555 ml    PHYSICAL EXAMINATION:  GENERAL:  59 y.o.-year-old patient lying in the bed with no acute distress.  EYES: Pupils equal, round, reactive to light and accommodation. No scleral icterus.  HEENT: Head atraumatic, normocephalic. Oropharynx and nasopharynx clear.  NECK:  Supple, no jugular venous distention. No thyroid enlargement, no tenderness.  LUNGS: Normal breath sounds bilaterally, no wheezing, rales,rhonchi or crepitation. No use of accessory muscles of respiration.  CARDIOVASCULAR: S1, S2 normal. No murmurs, rubs, or gallops.  ABDOMEN: Soft, non-tender, non-distended. Bowel sounds present. No organomegaly or mass.  EXTREMITIES: No pedal edema, cyanosis, or clubbing. HD Access upper extremity NEUROLOGIC: bilateral upper extremity weakness more than lower extremity. Reflexes zero symmetric upper extremity. Clonus present. Gait not tested. PSYCHIATRIC: The patient is alert and oriented x 3.  SKIN: No obvious rash, lesion, or ulcer.   DATA REVIEW:   CBC  Recent Labs  Lab 10/21/19 0754  WBC 6.0  HGB 9.2*  HCT 29.5*  PLT 291    Chemistries  Recent Labs  Lab 10/21/19 0754  NA 140  K 5.3*  CL 104  CO2 22  GLUCOSE 72  BUN 48*  CREATININE 11.27*  CALCIUM 7.1*    Microbiology Results   Recent Results (from the past 240 hour(s))  Respiratory Panel by RT PCR (Flu A&B, Covid) - Nasopharyngeal Swab     Status: None   Collection Time: 10/20/19  6:31 PM   Specimen: Nasopharyngeal Swab  Result Value Ref Range Status   SARS Coronavirus 2 by RT PCR NEGATIVE NEGATIVE Final    Comment: (NOTE) SARS-CoV-2 target nucleic acids are NOT DETECTED. The SARS-CoV-2 RNA is generally detectable in upper respiratoy specimens during  the acute phase of infection. The lowest concentration of SARS-CoV-2 viral copies this assay can detect is 131 copies/mL. A negative result does not preclude SARS-Cov-2 infection and should not be used as the sole basis for treatment or other patient management decisions. A negative result may occur with  improper specimen collection/handling, submission of specimen other than nasopharyngeal swab, presence of viral mutation(s) within the areas targeted by this assay, and inadequate number of viral copies (<131 copies/mL). A negative result must be combined with clinical observations, patient history, and epidemiological information. The expected result is Negative. Fact Sheet for Patients:  PinkCheek.be Fact Sheet for Healthcare Providers:  GravelBags.it This test is not yet ap proved or cleared by the [place] FDA and  has been authorized for detection and/or diagnosis of SARS-CoV-2 by FDA under an Emergency Use Authorization (EUA). This EUA will remain  in effect (meaning this test can be used) for the duration of the COVID-19 declaration under Section 564(b)(1) of the Act, 21 U.S.C. section 360bbb-3(b)(1), unless the authorization is terminated or revoked sooner.    Influenza A by PCR NEGATIVE NEGATIVE Final   Influenza B by PCR NEGATIVE NEGATIVE Final    Comment: (NOTE) The Xpert Xpress SARS-CoV-2/FLU/RSV assay is intended as an aid in  the diagnosis of influenza from Nasopharyngeal swab specimens and  should not be used as a sole basis for treatment. Nasal washings and  aspirates are unacceptable for Xpert Xpress SARS-CoV-2/FLU/RSV  testing. Fact Sheet for Patients: PinkCheek.be Fact Sheet for Healthcare Providers: GravelBags.it This test is not yet approved or cleared by the [place] FDA and  has been authorized for detection and/or diagnosis of  SARS-CoV-2 by  FDA under an Emergency Use Authorization (EUA). This EUA will remain  in effect (meaning this test can be used) for the duration of the  Covid-19 declaration under Section 564(b)(1) of the Act, 21  U.S.C. section 360bbb-3(b)(1), unless the authorization is  terminated or revoked. Performed at [facility], [address] [place], [place] [address]   Blood culture (routine x 2)     Status: None (Preliminary result)   Collection Time: 10/20/19 11:16 PM   Specimen: BLOOD  Result Value Ref Range Status   Specimen Description BLOOD RIGHT ASSIST CONTROL  Final   Special Requests   Final    BOTTLES DRAWN AEROBIC AND ANAEROBIC Blood Culture results may not be optimal due to an excessive volume of blood received in culture bottles   Culture   Final    NO GROWTH < 12 HOURS Performed at [facility], [address] [place], [place] [address]    Report Status PENDING  Incomplete  Blood culture (routine x 2)     Status: None (Preliminary result)   Collection Time: 10/20/19 11:21 PM   Specimen: BLOOD  Result Value Ref Range  Status   Specimen Description BLOOD RIGHT WRIST  Final   Special Requests   Final    BOTTLES DRAWN AEROBIC AND ANAEROBIC Blood Culture adequate volume   Culture   Final    NO GROWTH < 12 HOURS Performed at [facility], [address] [place], [place] [address]    Report Status PENDING  Incomplete    RADIOLOGY:  DG Abdomen 1 View  Result Date: 10/20/2019 CLINICAL DATA:  59 year old with absent bowel movements over the past 1-2 weeks. Current history of end-stage renal disease on hemodialysis. EXAM: ABDOMEN - 1 VIEW COMPARISON:  None. FINDINGS: Mild gaseous distention of the stomach. No evidence of small bowel or colonic distension. Moderate stool burden throughout the colon. Aortoiliofemoral atherosclerosis without evidence of aneurysm. Surgical clips in the RIGHT UPPER QUADRANT from prior cholecystectomy. Calcification  projected over both kidneys are likely arterial rather than urinary tract calculi. IMPRESSION: 1. Mild gaseous distention of the stomach. Moderate colonic stool burden. 2. No acute abdominal abnormality otherwise. 3. Aortoiliofemoral atherosclerosis. Electronically Signed   By: Evangeline Dakin M.D.   On: 10/20/2019 17:04   CT CERVICAL SPINE WO CONTRAST  Result Date: 10/20/2019 CLINICAL DATA:  Neck pain, progressive cannot control limb EXAM: CT CERVICAL SPINE WITHOUT CONTRAST TECHNIQUE: Multidetector CT imaging of the cervical spine was performed without intravenous contrast. Multiplanar CT image reconstructions were also generated. COMPARISON:  MRI same day, MRI May 11, 2019, CT April 29, 2019 FINDINGS: Alignment: There is straightening of the normal cervical lordosis. Skull base and vertebrae: Visualized skull base is intact. No atlanto-occipital dissociation. There is a nondisplaced fracture seen through the right C6 transverse foramina which appears to traverse the vertebral canal. This is best seen on series 6, image 66. There is also slightly anteriorly perched facets bilaterally at C6-C7. Again noted is fragmentation with disc height loss and inferior migration of the C6 vertebral body on the C7 vertebral body with destructive endplate changes. This is unchanged from MRI same day, however new since prior CT of April 29, 2019. There is also progressive cystic lucency seen within the C4 vertebral body. Disc height loss with endplate changes are noted at C4-C5 and C5-C6. Soft tissues and spinal canal: Prevertebral soft tissue swelling is seen from C2 through C7. There appears to be focal canal stenosis at C6-C7. Disc levels: C1-C2: Atlanto-axial junction is normal, without canal narrowing C2-C3: No significant spinal canal or neural foraminal narrowing C3-C4: Disc osteophyte complex and uncovertebral osteophytes are present which causes moderate canal stenosis and severe neural foraminal narrowing.  C4-C5: There is disc osteophyte complex and uncovertebral osteophytes which causes severe bilateral neural foraminal narrowing and moderate to severe central canal stenosis. C5-C6: Disc osteophyte complex and uncovertebral osteophytes are present which causes mild-to-moderate bilateral neural foraminal narrowing and mild central canal stenosis. C6-C7: There is fragmented endplate with small osseous fragment seen abutting the posterior thecal sac causing severe canal stenosis. Uncovertebral osteophytes with fragmented disc causes severe bilateral neural foraminal narrowing. C7-T1: No significant spinal canal or neural foraminal narrowing Upper chest: Mild centrilobular emphysematous changes seen at both lung apices. Thoracic inlet is within normal limits. Other: None IMPRESSION: 1. Nondisplaced fracture of the right C6 transverse foramina through the vertebral canal, age-indeterminate. Would however recommend CTA neck for further evaluation. 2. As on the MRI same day, new since 2020, endplate destructive changes with inferior migration of the C6 vertebral body and significant prevertebral soft tissue swelling, concerning for discitis/osteomyelitis. This causes severe canal and neural foraminal  stenosis. 3. Slightly perched facets at C6-C7. 4. These results were called by telephone at the time of interpretation on 10/20/2019 at 10:10 pm to provider [facility] , who verbally acknowledged these results. Electronically Signed   By: Prudencio Pair M.D.   On: 10/20/2019 22:12   MR CERVICAL SPINE WO CONTRAST  Result Date: 10/20/2019 CLINICAL DATA:  In stage renal disease in cervical myelopathy. Worsening weakness. EXAM: MRI CERVICAL SPINE WITHOUT CONTRAST TECHNIQUE: Multiplanar, multisequence MR imaging of the cervical spine was performed. No intravenous contrast was administered. COMPARISON:  07/16/2019 FINDINGS: Alignment: There is straightening of the normal cervical lordosis. No static subluxation. Vertebrae:  There is new height loss at C6 and C7 with edema in the disc space. Cord: Normal signal and morphology. Posterior Fossa, vertebral arteries, paraspinal tissues: Lung prevertebral effusion stretches from C2-T1. Greatest thickness measures 6 mm. Disc levels: Axial images are degraded by motion, limiting assessment of the neural foramina. C2-3: No disc herniation or spinal canal stenosis. C3-4: Right-greater-than-left facet hypertrophy with small disc bulge and right uncovertebral hypertrophy. Moderate spinal canal stenosis with severe bilateral neural foraminal stenosis. C4-5: Right asymmetric disc osteophyte complex with severe spinal canal stenosis and severe right, moderate left foraminal stenosis. C5-6: Small disc osteophyte complex with mild spinal canal stenosis. C6-7: Progressive disc narrowing with new endplate remodeling with central posterior spurring causing severe spinal canal stenosis. There is hyperintense T2-weighted signal within the disc space. IMPRESSION: Motion degraded study. 1. New height loss at C6 and C7 with hyperintense T2-weighted signal within the disc space, concerning for discitis-osteomyelitis or amyloid arthropathy. There is severe spinal canal stenosis at this level with impingement on the spinal cord. 2. Prevertebral effusion measuring 6 mm in thickness that stretches from C2-T1. 3. Severe spinal canal stenosis at C4-5 and C6-7 with moderate spinal canal stenosis at C3-4. 4. Multilevel moderate to severe neural foraminal stenosis, greatest at bilateral C3-4 and right C4-5. Critical Value/emergent results were called by telephone at the time of interpretation on 10/20/2019 at 8:33 pm to provider Dr. Archie Balboa, who verbally acknowledged these results. Electronically Signed   By: Ulyses Jarred M.D.   On: 10/20/2019 20:38     CODE STATUS:     Code Status Orders  (From admission, onward)         Start     Ordered   10/20/19 2328  Full code  Continuous     10/20/19 2327         Code Status History    Date Active Date Inactive Code Status Order ID Comments User Context   07/26/2019 2336 07/27/2019 1755 Full Code [id number]  Toy Baker, MD ED   04/29/2019 2040 05/13/2019 2033 Full Code [id number]  Mayo, Pete Pelt, MD Inpatient   04/02/2019 1807 04/04/2019 1647 Full Code [id number]  Nicholes Mango, MD ED   02/14/2019 2128 02/15/2019 0729 Full Code [id number]  Mayer Camel, NP ED   11/23/2018 1231 11/24/2018 1857 Full Code [id number]  Hillary Bow, MD ED   11/21/2018 1857 11/22/2018 2139 Full Code [id number]  Salary, Avel Peace, MD Inpatient   11/20/2018 0122 11/20/2018 1923 Full Code [id number]  Harrie Foreman, MD Inpatient   09/22/2018 1841 09/23/2018 2017 Full Code [id number]  Saundra Shelling, MD Inpatient   05/16/2018 1709 05/19/2018 1433 Full Code [id number]  Vaughan Basta, MD Inpatient   Advance Care Planning Activity       TOTAL TIME TAKING CARE OF THIS PATIENT: *40* minutes.    Fritzi Mandes M.D  [facility]    CC: Primary care physician; Patient, No Pcp Per

## 2019-10-21 NOTE — Progress Notes (Signed)
Pt presents agitated, verbalizes discontent with cooperating with nursing assessment of skin, vitals, routine ADL care.  pt refusing to participate in complete neuro assessment. Rolled onto his side, covered face with blanket.

## 2019-10-21 NOTE — TOC Transition Note (Signed)
Transition of Care [facility]) - CM/SW Discharge Note   Patient Details  Name: Jeremy [name] MRN: [id number] Date of Birth: 04-15-1961  [facility] CM/SW Contact:  Elease Hashimoto, LCSW Phone Number: 10/21/2019, 3:35 PM   Clinical Narrative:   Pt being transferred to [place] with RN has already called report and packet for EMS in chart. Pt is aware and agreeable to go where his neurosurgeon is. No further follow due to transfer today.          Patient Goals and CMS Choice        Discharge Placement                       Discharge Plan and Services                                     Social Determinants of Health (SDOH) Interventions     Readmission Risk Interventions Readmission Risk Prevention Plan 05/03/2019 05/01/2019 04/04/2019  Transportation Screening Complete Complete -  Medication Review [employer]) Complete Complete -  PCP or Specialist appointment within 3-5 days of discharge Complete - Complete  HRI or Home Care Consult Complete - -  SW Recovery Care/Counseling Consult Complete - Complete  Palliative Care Screening - Not Applicable -  [place] Complete - -  Some recent data might be hidden

## 2019-10-25 LAB — CULTURE, BLOOD (ROUTINE X 2)
Culture: NO GROWTH
Culture: NO GROWTH
Special Requests: ADEQUATE

## 2019-11-06 ENCOUNTER — Emergency Department
Admission: EM | Admit: 2019-11-06 | Discharge: 2019-11-06 | Disposition: A | Discharge status: Rehabilitation Facility | Payer: Medicare Other | Attending: Emergency Medicine | Admitting: Emergency Medicine

## 2019-11-06 ENCOUNTER — Other Ambulatory Visit: Payer: Self-pay

## 2019-11-06 ENCOUNTER — Emergency Department: Payer: Medicare Other

## 2019-11-06 DIAGNOSIS — M4622 Osteomyelitis of vertebra, cervical region: Secondary | ICD-10-CM | POA: Diagnosis not present

## 2019-11-06 DIAGNOSIS — F17210 Nicotine dependence, cigarettes, uncomplicated: Secondary | ICD-10-CM | POA: Insufficient documentation

## 2019-11-06 DIAGNOSIS — Z992 Dependence on renal dialysis: Secondary | ICD-10-CM | POA: Diagnosis not present

## 2019-11-06 DIAGNOSIS — Z79899 Other long term (current) drug therapy: Secondary | ICD-10-CM | POA: Insufficient documentation

## 2019-11-06 DIAGNOSIS — I509 Heart failure, unspecified: Secondary | ICD-10-CM | POA: Diagnosis not present

## 2019-11-06 DIAGNOSIS — W1830XA Fall on same level, unspecified, initial encounter: Secondary | ICD-10-CM | POA: Insufficient documentation

## 2019-11-06 DIAGNOSIS — Y999 Unspecified external cause status: Secondary | ICD-10-CM | POA: Insufficient documentation

## 2019-11-06 DIAGNOSIS — Z981 Arthrodesis status: Secondary | ICD-10-CM | POA: Diagnosis not present

## 2019-11-06 DIAGNOSIS — N186 End stage renal disease: Secondary | ICD-10-CM | POA: Insufficient documentation

## 2019-11-06 DIAGNOSIS — Y9389 Activity, other specified: Secondary | ICD-10-CM | POA: Insufficient documentation

## 2019-11-06 DIAGNOSIS — M546 Pain in thoracic spine: Secondary | ICD-10-CM | POA: Diagnosis not present

## 2019-11-06 DIAGNOSIS — I132 Hypertensive heart and chronic kidney disease with heart failure and with stage 5 chronic kidney disease, or end stage renal disease: Secondary | ICD-10-CM | POA: Diagnosis not present

## 2019-11-06 DIAGNOSIS — W19XXXA Unspecified fall, initial encounter: Secondary | ICD-10-CM

## 2019-11-06 DIAGNOSIS — Y92129 Unspecified place in nursing home as the place of occurrence of the external cause: Secondary | ICD-10-CM | POA: Insufficient documentation

## 2019-11-06 LAB — CBC WITH DIFFERENTIAL/PLATELET
Abs Immature Granulocytes: 0.05 10*3/uL (ref 0.00–0.07)
Basophils Absolute: 0.1 10*3/uL (ref 0.0–0.1)
Basophils Relative: 1 %
Eosinophils Absolute: 0.2 10*3/uL (ref 0.0–0.5)
Eosinophils Relative: 2 %
HCT: 29.8 % — ABNORMAL LOW (ref 39.0–52.0)
Hemoglobin: 9.5 g/dL — ABNORMAL LOW (ref 13.0–17.0)
Immature Granulocytes: 1 %
Lymphocytes Relative: 6 %
Lymphs Abs: 0.6 10*3/uL — ABNORMAL LOW (ref 0.7–4.0)
MCH: 29.9 pg (ref 26.0–34.0)
MCHC: 31.9 g/dL (ref 30.0–36.0)
MCV: 93.7 fL (ref 80.0–100.0)
Monocytes Absolute: 0.8 10*3/uL (ref 0.1–1.0)
Monocytes Relative: 8 %
Neutro Abs: 8 10*3/uL — ABNORMAL HIGH (ref 1.7–7.7)
Neutrophils Relative %: 82 %
Platelets: 297 10*3/uL (ref 150–400)
RBC: 3.18 MIL/uL — ABNORMAL LOW (ref 4.22–5.81)
RDW: 16.5 % — ABNORMAL HIGH (ref 11.5–15.5)
WBC: 9.7 10*3/uL (ref 4.0–10.5)
nRBC: 0 % (ref 0.0–0.2)

## 2019-11-06 LAB — BASIC METABOLIC PANEL
Anion gap: 17 — ABNORMAL HIGH (ref 5–15)
BUN: 35 mg/dL — ABNORMAL HIGH (ref 6–20)
CO2: 21 mmol/L — ABNORMAL LOW (ref 22–32)
Calcium: 9.1 mg/dL (ref 8.9–10.3)
Chloride: 99 mmol/L (ref 98–111)
Creatinine, Ser: 6.77 mg/dL — ABNORMAL HIGH (ref 0.61–1.24)
GFR calc Af Amer: 9 mL/min — ABNORMAL LOW (ref >60)
GFR calc non Af Amer: 8 mL/min — ABNORMAL LOW (ref >60)
Glucose, Bld: 66 mg/dL — ABNORMAL LOW (ref 70–99)
Potassium: 3.8 mmol/L (ref 3.5–5.1)
Sodium: 137 mmol/L (ref 135–145)

## 2019-11-06 MED ORDER — MORPHINE SULFATE (PF) 2 MG/ML IV SOLN
2.0000 mg | Freq: Once | INTRAVENOUS | Status: AC
  Administered 2019-11-06: 14:00:00 2 mg via INTRAVENOUS
  Filled 2019-11-06: qty 1

## 2019-11-06 NOTE — ED Provider Notes (Signed)
[facility] Emergency Department Provider Note ____________________________________________  Time seen: 1215  I have reviewed the triage vital signs and the nursing notes.  HISTORY  Chief Complaint  Fall (Neck and back pain )  HPI Jeremy [name] is a 59 y.o. male presents to the ED via EMS from [facility]  Patient with a history of severe ESRD on hemodialysis, CHF, a fib, T2DM, HTN,and acute cervical discitis. He is 4-days s/p emergent C3-T2 posterior fusion at [facility] for cervical discitis/osteomyelitis. He is at the SNF receiving a 6-week course of vanc with HD + daily rifampin for staph epi bacteremia. Patient was found in the floor by facility staff, after he apparently rang the call bell to go the bathroom. No report of how long the patient was down this morning. He admits to being down for about 20 minutes. He also admits that he feels the staff is taking too long to answer his call, and he has fallen after getting himself out of bed. He reports 3 falls over the last 3 days, under the same circumstances. He apparently had a mobile cervical spine XR ordered by his physician, yesterday. He is here for further imaging and evaluation at his physician's request. Patient complains of 10/10 pain to the upper back and shoulders, related to the fall.   Past Medical History:  Diagnosis Date  . Bronchitis   . Hypertension   . Renal disorder    kidney failure, dialysis    Patient Active Problem List   Diagnosis Date Noted  . Cervical discitis   . Generalized weakness 10/20/2019  . Cervical myelopathy ([place]) 10/20/2019  . Symptomatic anemia 07/26/2019  . ESRD on hemodialysis ([place]) 07/26/2019  . Neck pain 07/26/2019  . Melena 07/26/2019  . Hx of atrial fibrillation without current medication 07/26/2019  . Atrial fibrillation with rapid ventricular response ([place]) 04/30/2019  . Syncope 04/29/2019  . Acute CHF (congestive heart failure) ([place]) 02/14/2019  . Acute  encephalopathy 11/23/2018  . Influenza B 11/21/2018  . Pulmonary edema 09/22/2018  . Hyperkalemia 05/16/2018    Past Surgical History:  Procedure Laterality Date  . DIALYSIS FISTULA CREATION      Prior to Admission medications   Medication Sig Start Date End Date Taking? Authorizing Provider  albuterol (PROVENTIL HFA;VENTOLIN HFA) 108 (90 Base) MCG/ACT inhaler Inhale 2 puffs into the lungs every 6 (six) hours as needed for wheezing or shortness of breath. 11/19/18   Nena Polio, MD  amLODipine (NORVASC) 10 MG tablet Take 10 mg by mouth daily. 05/25/19   [provider]  cinacalcet (SENSIPAR) 30 MG tablet Take 30 mg by mouth 3 (three) times a week. 09/04/15   [provider]  gabapentin (NEURONTIN) 300 MG capsule Take 300 mg by mouth at bedtime.    [provider]  metoprolol tartrate (LOPRESSOR) 25 MG tablet Take 12.5 mg by mouth 2 (two) times daily.    [provider]  Multiple Vitamin (MULTIVITAMIN WITH MINERALS) TABS tablet Take 1 tablet by mouth daily.    [provider]  oxyCODONE (OXY IR/ROXICODONE) 5 MG immediate release tablet Take 5 mg by mouth every 6 (six) hours as needed for severe pain.    [provider]  sevelamer carbonate (RENVELA) 800 MG tablet Take 2,400 mg by mouth 3 (three) times daily. 09/22/19   [provider]  tiZANidine (ZANAFLEX) 2 MG tablet Take 4 mg by mouth 3 (three) times daily. 10/15/19   [provider]    Allergies  Patient has no known allergies.  Family History  Problem Relation Age of Onset  . Hypertension Mother     Social History Social History   Tobacco Use  . Smoking status: Current Every Day Smoker    Packs/day: 0.50    Types: Cigarettes  . Smokeless tobacco: Never Used  Substance Use Topics  . Alcohol use: Yes    Comment: rare  . Drug use: Yes    Types: Marijuana    Review of Systems  Constitutional: Negative for fever. Cardiovascular: Negative for chest  pain. Respiratory: Negative for shortness of breath. Gastrointestinal: Negative for abdominal pain, vomiting and diarrhea. Genitourinary: Negative for dysuria. Musculoskeletal: Positive for upper back pain. Skin: Negative for rash. Neurological: Negative for headaches, focal weakness or numbness. ____________________________________________  PHYSICAL EXAM:  VITAL SIGNS: ED Triage Vitals  Enc Vitals Group     BP 11/06/19 1210 (!) 165/71     Pulse Rate 11/06/19 1210 89     Resp 11/06/19 1210 16     Temp 11/06/19 1210 (!) 97.4 F (36.3 C)     Temp Source 11/06/19 1210 Oral     SpO2 11/06/19 1210 97 %     Weight 11/06/19 1213 131 lb 12.8 oz (59.8 kg)     Height 11/06/19 1213 5\' 6"  (1.676 m)     Head Circumference --      [facility] --      Pain Score 11/06/19 1212 10     Pain Loc --      Pain Edu? --      Excl. in [place]? --     Constitutional: Alert and oriented. Well appearing and in no distress. Head: Normocephalic and atraumatic. Eyes: Conjunctivae are normal. Normal extraocular movements Neck: [place] collar in place Cardiovascular: Normal rate, regular rhythm. Normal distal pulses. Respiratory: Normal respiratory effort. No wheezes/rales/rhonchi. Gastrointestinal: Soft and nontender. No distention. Musculoskeletal: nontender to palp along the spine. Lidoderm patches in place to the bilateral trapezius musculature. Nontender with normal range of motion in all extremities.  Neurologic:  Normal gross sensation. Normal speech and language. No gross focal neurologic deficits are appreciated. Skin:  Skin is warm, dry and intact. No rash noted. Psychiatric: Mood and affect are normal. Patient exhibits appropriate insight and judgment. ____________________________________________   LABS (pertinent positives/negatives) Labs Reviewed  BASIC METABOLIC PANEL - Abnormal; Notable for the following components:      Result Value   CO2 21 (*)    Glucose, Bld 66 (*)    BUN 35 (*)     Creatinine, Ser 6.77 (*)    GFR calc non Af Amer 8 (*)    GFR calc Af Amer 9 (*)    Anion gap 17 (*)    All other components within normal limits  CBC WITH DIFFERENTIAL/PLATELET - Abnormal; Notable for the following components:   RBC 3.18 (*)    Hemoglobin 9.5 (*)    HCT 29.8 (*)    RDW 16.5 (*)    Neutro Abs 8.0 (*)    Lymphs Abs 0.6 (*)    All other components within normal limits  ____________________________________________   RADIOLOGY  CT Cervical & Thoracic Spine  IMPRESSION: 1. Discitis-osteomyelitis at C6-7 with persistent osteolysis of the C6 and C7 vertebral bodies. Stabilization posterior spinal fusion from C3 through T2. Poor soft tissue discrimination of the cervical soft tissues is concerning for edema secondary to reactive edema and anasarca or infection. If there is further clinical concern recommend a CT of the  neck with intravenous contrast. 2.  No acute osseous injury of the thoracic spine. 3.  Aortic Atherosclerosis (ICD10-I70.0).  ____________________________________________  PROCEDURES  Morphine 2 mg IVP Procedures ____________________________________________  INITIAL IMPRESSION / ASSESSMENT AND PLAN / ED COURSE  Patient who is 4 days status post cervical fusion surgery for an acute osteomyelitis discitis of the cervical spine, presents following a mechanical fall.  Patient is at a skilled nursing facility, and admits to falling after attempting to get out of bed. He denies any new weakness, but admits to pain in the upper back. His CT scans do not reveal any disruption of spinal hardware. His osteomyelitis is again demonstrated on CT. He will be transferred back to [facility] and continue with rehab and IV vanc on HD days. He reports improvement of his pain after IV morphine administration.   Jeremy [name] was evaluated in Emergency Department on 11/06/2019 for the symptoms described in the history of present illness. He was evaluated in the context  of the global COVID-19 pandemic, which necessitated consideration that the patient might be at risk for infection with the SARS-CoV-2 virus that causes COVID-19. Institutional protocols and algorithms that pertain to the evaluation of patients at risk for COVID-19 are in a state of rapid change based on information released by regulatory bodies including the CDC and federal and state organizations. These policies and algorithms were followed during the patient's care in the ED. ____________________________________________  FINAL CLINICAL IMPRESSION(S) / ED DIAGNOSES  Final diagnoses:  Fall, initial encounter  S/P cervical spinal fusion  Osteomyelitis of cervical spine ([place])      Carmie End, Dannielle Karvonen, PA-C 11/06/19 [place]    Arta Silence, MD 11/06/19 [phone]

## 2019-11-06 NOTE — Discharge Instructions (Addendum)
Your exam, labs, and CT scans are within normal limits. You do not have any disruption of your spinal hardware. Continue with prescribed oral and IV meds and rehab at the skilled nursing facility. See your provider as needed.

## 2019-11-06 NOTE — ED Notes (Signed)
US at bedside

## 2019-11-06 NOTE — ED Triage Notes (Addendum)
Pt to ED via [facility] from [facility] for rehabilitation from spinal surgery. Per EMS pt needed to go to the bathroom and hit the call ball. Staff found pt in the floor early this morning. Per EMS facility ordered mobile x-ray of cervical spine. Pt's MD wanted him to be seen for x-ray of thoracic spine as well.    Upon arrival pt A&Ox4. Pt with c-collar in place. Pt with hx of spinal cord surgery 4 days ago. Pt state pain 10/10 in neck, upper back and shoulders.

## 2019-11-15 ENCOUNTER — Emergency Department: Payer: Medicare Other

## 2019-11-15 ENCOUNTER — Emergency Department
Admission: EM | Admit: 2019-11-15 | Discharge: 2019-11-15 | Disposition: A | Discharge status: Home / Self Care | Payer: Medicare Other | Attending: Emergency Medicine | Admitting: Emergency Medicine

## 2019-11-15 ENCOUNTER — Encounter: Payer: Self-pay | Admitting: Emergency Medicine

## 2019-11-15 ENCOUNTER — Other Ambulatory Visit: Payer: Self-pay

## 2019-11-15 DIAGNOSIS — R079 Chest pain, unspecified: Secondary | ICD-10-CM | POA: Diagnosis not present

## 2019-11-15 DIAGNOSIS — M4642 Discitis, unspecified, cervical region: Secondary | ICD-10-CM | POA: Diagnosis not present

## 2019-11-15 DIAGNOSIS — N186 End stage renal disease: Secondary | ICD-10-CM | POA: Diagnosis not present

## 2019-11-15 DIAGNOSIS — I509 Heart failure, unspecified: Secondary | ICD-10-CM | POA: Diagnosis not present

## 2019-11-15 DIAGNOSIS — I132 Hypertensive heart and chronic kidney disease with heart failure and with stage 5 chronic kidney disease, or end stage renal disease: Secondary | ICD-10-CM | POA: Insufficient documentation

## 2019-11-15 DIAGNOSIS — R0789 Other chest pain: Secondary | ICD-10-CM

## 2019-11-15 DIAGNOSIS — Z992 Dependence on renal dialysis: Secondary | ICD-10-CM | POA: Diagnosis not present

## 2019-11-15 HISTORY — DX: Disorder of kidney and ureter, unspecified: N28.9

## 2019-11-15 LAB — CBC
HCT: 27 % — ABNORMAL LOW (ref 39.0–52.0)
Hemoglobin: 8.2 g/dL — ABNORMAL LOW (ref 13.0–17.0)
MCH: 29.8 pg (ref 26.0–34.0)
MCHC: 30.4 g/dL (ref 30.0–36.0)
MCV: 98.2 fL (ref 80.0–100.0)
Platelets: 428 10*3/uL — ABNORMAL HIGH (ref 150–400)
RBC: 2.75 MIL/uL — ABNORMAL LOW (ref 4.22–5.81)
RDW: 16.8 % — ABNORMAL HIGH (ref 11.5–15.5)
WBC: 6.4 10*3/uL (ref 4.0–10.5)
nRBC: 0 % (ref 0.0–0.2)

## 2019-11-15 LAB — BASIC METABOLIC PANEL
Anion gap: 12 (ref 5–15)
BUN: 43 mg/dL — ABNORMAL HIGH (ref 6–20)
CO2: 26 mmol/L (ref 22–32)
Calcium: 9.7 mg/dL (ref 8.9–10.3)
Chloride: 102 mmol/L (ref 98–111)
Creatinine, Ser: 6.39 mg/dL — ABNORMAL HIGH (ref 0.61–1.24)
GFR calc Af Amer: 10 mL/min — ABNORMAL LOW (ref >60)
GFR calc non Af Amer: 9 mL/min — ABNORMAL LOW (ref >60)
Glucose, Bld: 103 mg/dL — ABNORMAL HIGH (ref 70–99)
Potassium: 3.8 mmol/L (ref 3.5–5.1)
Sodium: 140 mmol/L (ref 135–145)

## 2019-11-15 LAB — TROPONIN I (HIGH SENSITIVITY)
Troponin I (High Sensitivity): 42 ng/L — ABNORMAL HIGH (ref <18)
Troponin I (High Sensitivity): 39 ng/L — ABNORMAL HIGH (ref <18)

## 2019-11-15 MED ORDER — IOHEXOL 350 MG/ML SOLN
75.0000 mL | Freq: Once | INTRAVENOUS | Status: AC | PRN
  Administered 2019-11-15: 16:00:00 75 mL via INTRAVENOUS

## 2019-11-15 MED ORDER — MORPHINE SULFATE (PF) 4 MG/ML IV SOLN
4.0000 mg | Freq: Once | INTRAVENOUS | Status: AC
  Administered 2019-11-15: 4 mg via INTRAVENOUS
  Filled 2019-11-15: qty 1

## 2019-11-15 NOTE — ED Provider Notes (Signed)
[facility] Emergency Department Provider Note   ____________________________________________   First MD Initiated Contact with Patient 11/15/19 1541     (approximate)  I have reviewed the triage vital signs and the nursing notes.   HISTORY  Chief Complaint Chest Pain    HPI Jeremy [name] is a 59 y.o. male with HD, hypertension, A. Fib (not currently anticoagulated), CHF, and cervical osteomyelitis/discitis presents to the ED complaining of chest pain.  Patient reports he has had 2 days of constant pressure in the center of his chest that is not exacerbated by anything.  It has been associated with some difficulty catching his breath, but he denies any difficulty breathing right now on his usual 2 L nasal cannula.  He denies fevers or cough has otherwise been doing well while recovering from recent spinal surgery.  He recently had C3-T2 fusion for osteomyelitis/discitis and has been receiving regular vancomycin and rifampin at skilled nursing facility.  He completed a full run of dialysis earlier today without issue.  He denies any history of MI, has not dealt with similar symptoms in the past.        Past Medical History:  Diagnosis Date  . Bronchitis   . Hypertension   . Renal disorder    kidney failure, dialysis  . Renal insufficiency     Patient Active Problem List   Diagnosis Date Noted  . Cervical discitis   . Generalized weakness 10/20/2019  . Cervical myelopathy ([place]) 10/20/2019  . Symptomatic anemia 07/26/2019  . ESRD on hemodialysis ([place]) 07/26/2019  . Neck pain 07/26/2019  . Melena 07/26/2019  . Hx of atrial fibrillation without current medication 07/26/2019  . Atrial fibrillation with rapid ventricular response ([place]) 04/30/2019  . Syncope 04/29/2019  . Acute CHF (congestive heart failure) ([place]) 02/14/2019  . Acute encephalopathy 11/23/2018  . Influenza B 11/21/2018  . Pulmonary edema 09/22/2018  . Hyperkalemia 05/16/2018    Past  Surgical History:  Procedure Laterality Date  . DIALYSIS FISTULA CREATION      Prior to Admission medications   Medication Sig Start Date End Date Taking? Authorizing Provider  albuterol (PROVENTIL HFA;VENTOLIN HFA) 108 (90 Base) MCG/ACT inhaler Inhale 2 puffs into the lungs every 6 (six) hours as needed for wheezing or shortness of breath. 11/19/18   Nena Polio, MD  amLODipine (NORVASC) 10 MG tablet Take 10 mg by mouth daily. 05/25/19   [provider]  cinacalcet (SENSIPAR) 30 MG tablet Take 30 mg by mouth 3 (three) times a week. 09/04/15   [provider]  gabapentin (NEURONTIN) 300 MG capsule Take 300 mg by mouth at bedtime.    [provider]  metoprolol tartrate (LOPRESSOR) 25 MG tablet Take 12.5 mg by mouth 2 (two) times daily.    [provider]  Multiple Vitamin (MULTIVITAMIN WITH MINERALS) TABS tablet Take 1 tablet by mouth daily.    [provider]  oxyCODONE (OXY IR/ROXICODONE) 5 MG immediate release tablet Take 5 mg by mouth every 6 (six) hours as needed for severe pain.    [provider]  sevelamer carbonate (RENVELA) 800 MG tablet Take 2,400 mg by mouth 3 (three) times daily. 09/22/19   [provider]  tiZANidine (ZANAFLEX) 2 MG tablet Take 4 mg by mouth 3 (three) times daily. 10/15/19   [provider]    Allergies Patient has no known allergies.  Family History  Problem Relation Age of Onset  . Hypertension Mother     Social  History Social History   Tobacco Use  . Smoking status: Former Smoker    Packs/day: 0.00    Types: Cigarettes  . Smokeless tobacco: Never Used  Substance Use Topics  . Alcohol use: Yes  . Drug use: Yes    Types: Marijuana    Review of Systems  Constitutional: No fever/chills Eyes: No visual changes. ENT: No sore throat. Cardiovascular: Positive for chest pain. Respiratory: Positive for shortness of breath. Gastrointestinal: No abdominal pain.  No nausea, no  vomiting.  No diarrhea.  No constipation. Genitourinary: Negative for dysuria. Musculoskeletal: Negative for back pain. Skin: Negative for rash. Neurological: Negative for headaches, focal weakness or numbness.  ____________________________________________   PHYSICAL EXAM:  VITAL SIGNS: ED Triage Vitals  Enc Vitals Group     BP 11/15/19 1334 (!) 113/59     Pulse Rate 11/15/19 1334 72     Resp 11/15/19 1334 20     Temp 11/15/19 1334 98.4 F (36.9 C)     Temp Source 11/15/19 1334 Oral     SpO2 11/15/19 1334 100 %     Weight 11/15/19 1334 149 lb (67.6 kg)     Height 11/15/19 1334 5\' 6"  (1.676 m)     Head Circumference --      [facility] --      Pain Score 11/15/19 1342 7     Pain Loc --      Pain Edu? --      Excl. in [place]? --     Constitutional: Alert and oriented. Eyes: Conjunctivae are normal. Head: Atraumatic. Nose: No congestion/rhinnorhea. Mouth/Throat: Mucous membranes are moist. Neck: Cervical collar in place, surgical site to posterior neck is clean, dry, and intact. Cardiovascular: Normal rate, regular rhythm. Grossly normal heart sounds.  Left upper extremity AV fistula with palpable thrill. Respiratory: Normal respiratory effort.  No retractions. Lungs CTAB.  No chest wall tenderness to palpation. Gastrointestinal: Soft and nontender. No distention. Genitourinary: deferred Musculoskeletal: No lower extremity tenderness nor edema. Neurologic:  Normal speech and language. No gross focal neurologic deficits are appreciated. Skin:  Skin is warm, dry and intact. No rash noted. Psychiatric: Mood and affect are normal. Speech and behavior are normal.  ____________________________________________   LABS (all labs ordered are listed, but only abnormal results are displayed)  Labs Reviewed  BASIC METABOLIC PANEL - Abnormal; Notable for the following components:      Result Value   Glucose, Bld 103 (*)    BUN 43 (*)    Creatinine, Ser 6.39 (*)    GFR calc non Af  Amer 9 (*)    GFR calc Af Amer 10 (*)    All other components within normal limits  CBC - Abnormal; Notable for the following components:   RBC 2.75 (*)    Hemoglobin 8.2 (*)    HCT 27.0 (*)    RDW 16.8 (*)    Platelets 428 (*)    All other components within normal limits  TROPONIN I (HIGH SENSITIVITY) - Abnormal; Notable for the following components:   Troponin I (High Sensitivity) 42 (*)    All other components within normal limits  TROPONIN I (HIGH SENSITIVITY) - Abnormal; Notable for the following components:   Troponin I (High Sensitivity) 39 (*)    All other components within normal limits   ____________________________________________  EKG  ED ECG REPORT I, Blake Divine, the attending physician, personally viewed and interpreted this ECG.   Date: 11/15/2019  EKG Time: 13:30  Rate: 73  Rhythm: normal sinus rhythm  Axis: Normal  Intervals:none  ST&T Change: LVH   PROCEDURES  Procedure(s) performed (including Critical Care):  Procedures   ____________________________________________   INITIAL IMPRESSION / ASSESSMENT AND PLAN / ED COURSE       59 year old male with history of ESRD on HD and recent cervical spine procedure for osteomyelitis/discitis with ongoing antibiotic treatment patient presents to the ED for constant chest pressure and difficulty breathing over the past 2 days.  He is not in any acute distress and vital signs are reassuring.  EKG shows no evidence of arrhythmia or ischemia, initial troponin is elevated however it is improved compared to prior levels and I doubt this represents ACS given his ESRD.  Symptoms sound atypical for ACS repeat troponin is stable, we would be able to rule this out.  Also need to assess for PE given his recent major surgical procedure, will check CTA chest.  Remainder of labs are unremarkable chest x-ray is negative for acute process.  CTA chest negative for acute process, no evidence of PE, but does show findings of  known discitis/osteomyelitis for which she continues to receive antibiotics.  He has no worsening neck pain or neurologic deficits to suggest worsening of cervical spine disease.  Repeat troponin is stable and patient states his chest pain is much improved.  He is appropriate for discharge home and follow-up with his PCP, patient agrees with plan.      ____________________________________________   FINAL CLINICAL IMPRESSION(S) / ED DIAGNOSES  Final diagnoses:  Atypical chest pain  ESRD on hemodialysis [facility])  Cervical discitis     ED Discharge Orders    None       Note:  This document was prepared using Dragon voice recognition software and may include unintentional dictation errors.   Blake Divine, MD 11/15/19 1730

## 2019-11-15 NOTE — ED Triage Notes (Signed)
Pt in via [facility] from [facility], complaints of generalized chest pain x 2 days, denies any accompanying symptoms.  Pt in C-collar upon arrival; reports recent spinal cord injury.  Pt on 2L nasal cannula upon arrival per EMS.  NAD noted at this time.

## 2019-11-15 NOTE — ED Notes (Signed)
Attempted to call report to [facility]. No answer .

## 2019-11-15 NOTE — ED Notes (Signed)
Signature pad bot working. PT denies any further questions regarding d/c.

## 2019-11-15 NOTE — ED Notes (Signed)
PT given chocolate milk, water, blankets, tissues and readjusted in bed and lights turned out for comfort.

## 2019-11-15 NOTE — ED Notes (Signed)
PT given chocolate milk and sat up in bed.

## 2019-11-15 NOTE — ED Triage Notes (Signed)
Pt comes into the ED via EMS from [facility] with c/o chest pain since yesterday and was given nitro SLx1 today. Pt is in NAD. B/p175/80, #20g RAC, given nitro spray x1 in route. Pt had recent cervical surgery 2/22 [facility]. On 2L [facility] continuous. 6/10 pain after nitro

## 2020-03-20 IMAGING — CT CT HEAD WITHOUT CONTRAST
2 of 3 series · 13 of 37 positions shown, 16 images · non-contrast
Comparison: None.

CLINICAL DATA: Altered mental status.  Renal failure

EXAM:
CT HEAD WITHOUT CONTRAST
TECHNIQUE: Contiguous axial images were obtained from the base of the skull
through the vertex without intravenous contrast.

[Series 5: sagittal soft tissue · sagittal · 0.31mm/px · 3 of 57 slices shown]
[im 19/57  brain]
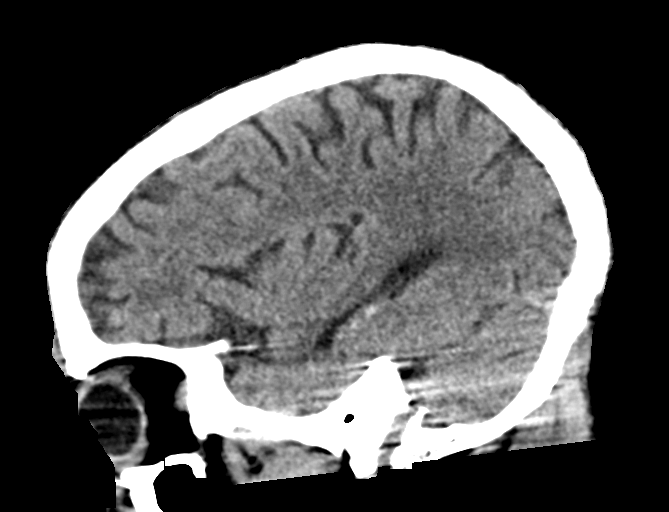
[im 29/57  brain]
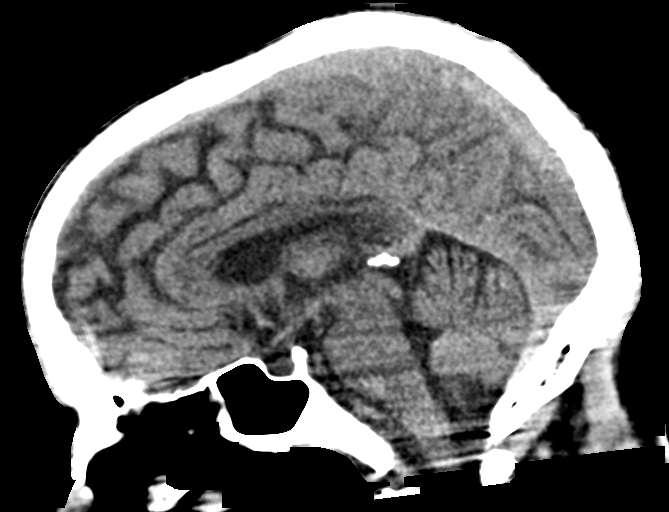
[im 38/57  brain]
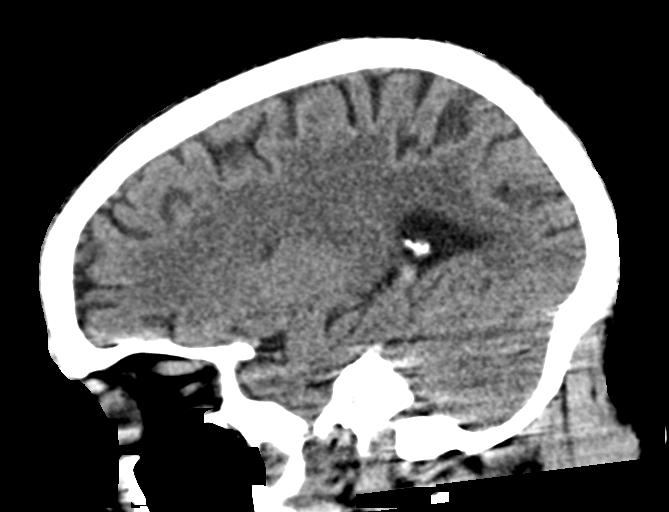

[Series 6: axial soft tissue · axial · 0.34mm/px · z∈[-125,+15]mm · 10 of 54 slices shown, 13 images]
[im 3/54  brain]
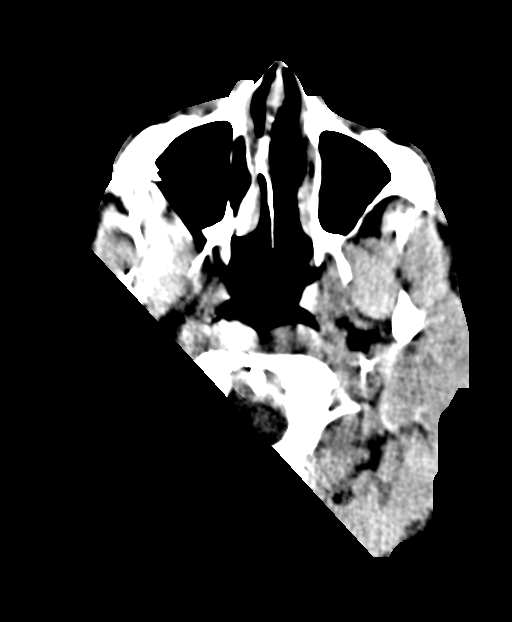
[im 3/54  bone]
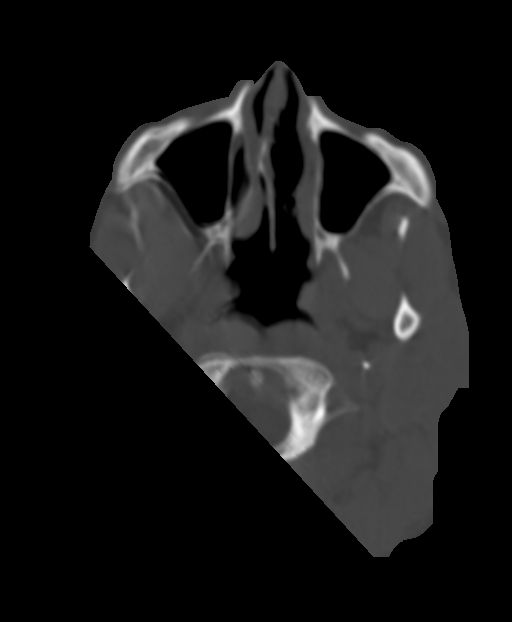
[im 9/54  brain]
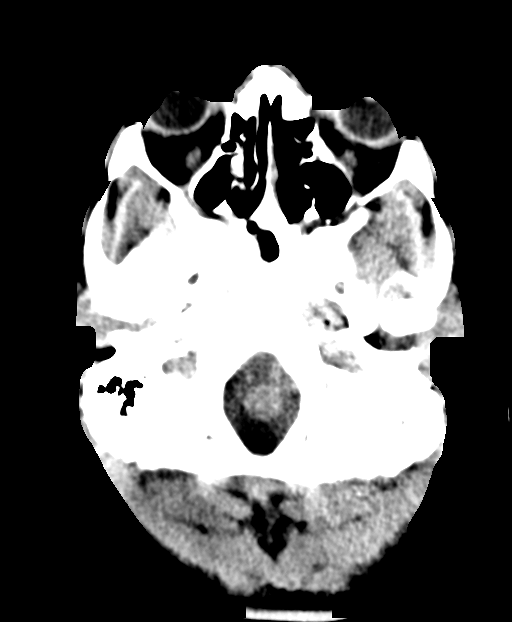
[im 15/54  brain]
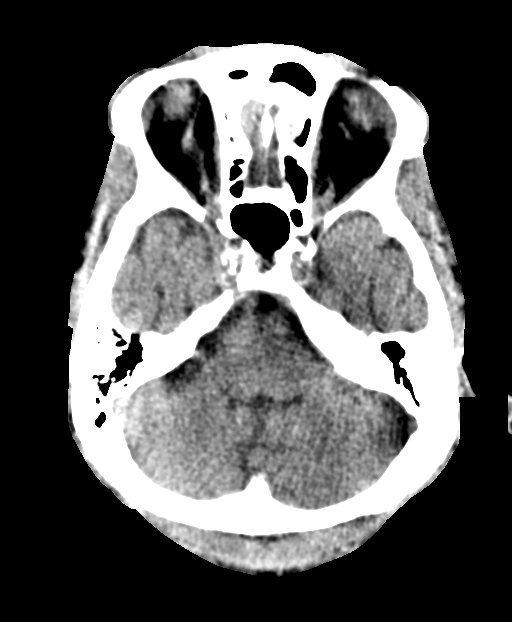
[im 18/54  brain]
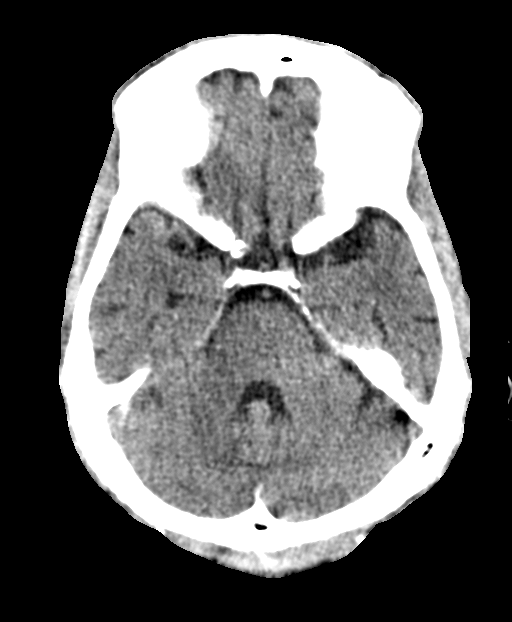
[im 24/54  brain]
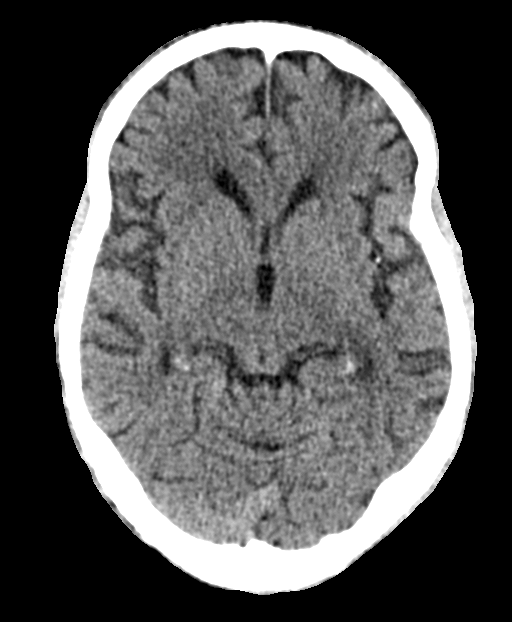
[im 24/54  bone]
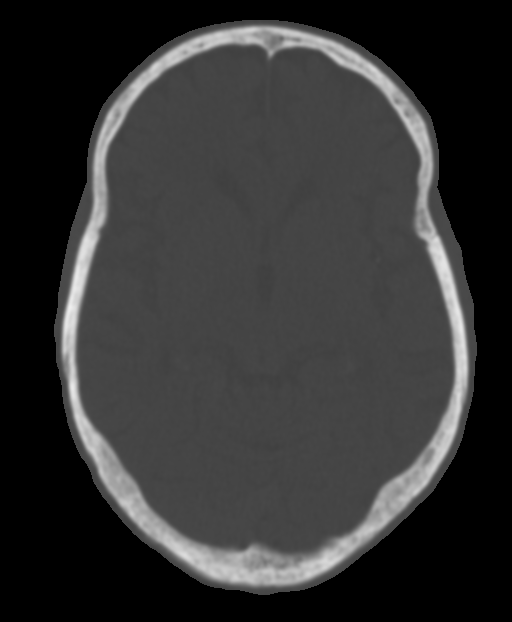
[im 30/54  brain]
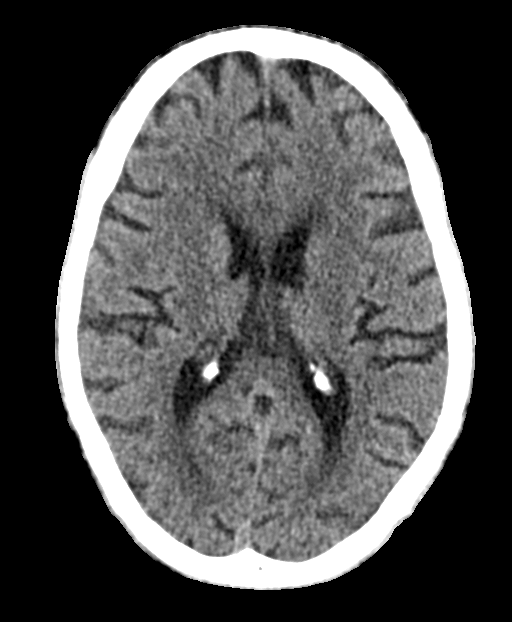
[im 36/54  brain]
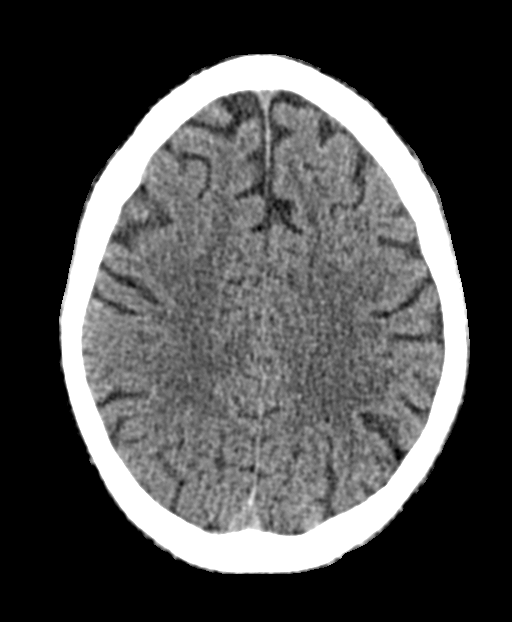
[im 39/54  brain]
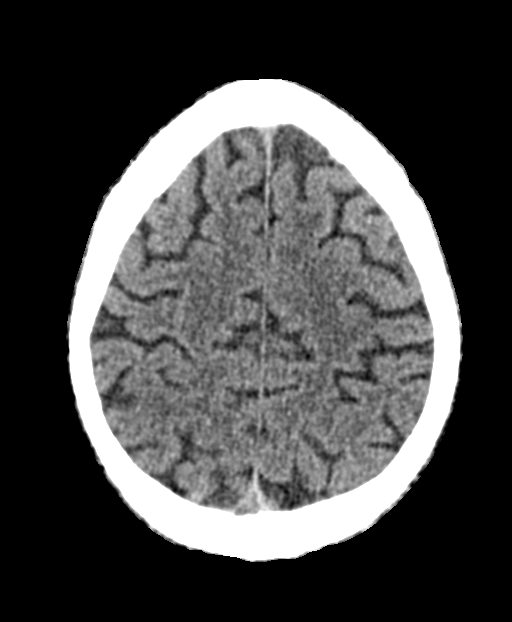
[im 45/54  brain]
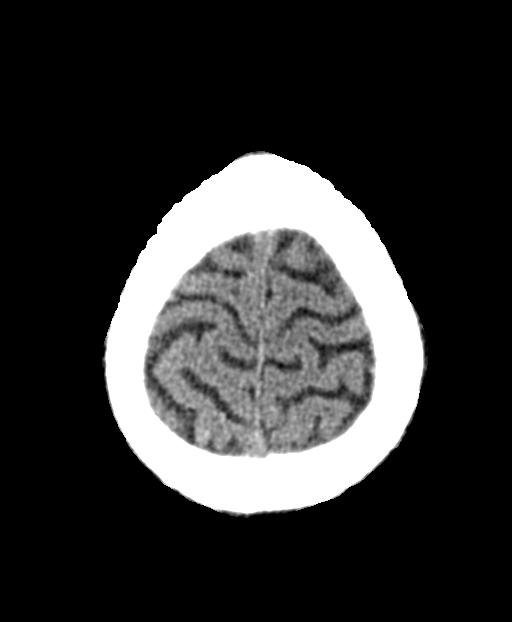
[im 45/54  bone]
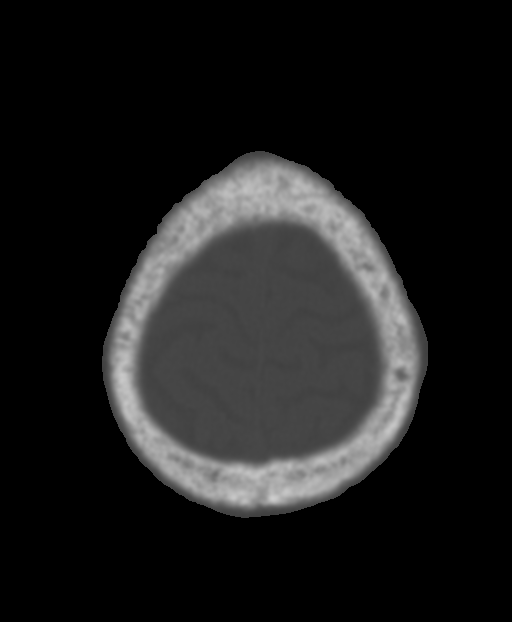
[im 51/54  brain]
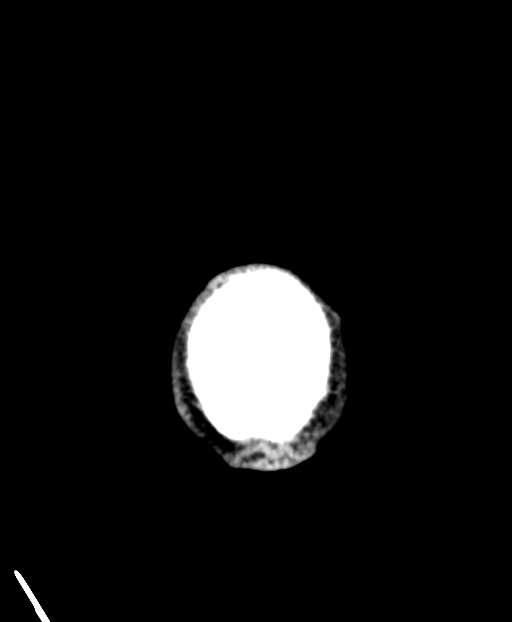

[13 of 37 positions shown; findings below may reference images not displayed]

FINDINGS: Brain: The ventricles and sulci are upper normal in size for age.
There is no intracranial mass, hemorrhage, extra-axial fluid
collection, or midline shift. There is slight small vessel disease
in the centra semiovale bilaterally. There is evidence of a prior
tiny lacunar infarct in the head of the caudate nucleus on the left.
No acute appearing infarct is evident.

Vascular: There is no appreciable hyperdense vessel. There is
calcification in each carotid siphon and distal vertebral artery
region.

Skull: There are changes of osteomalacia in the calvarium. No lytic
or destructive lesions.

Sinuses/Orbits: There is mucosal thickening in several ethmoid air
cells. Other visualized paranasal sinuses are clear. Orbits appear
symmetric bilaterally.

Other: Mastoid air cells are clear.
IMPRESSION: Slight periventricular small vessel disease. Tiny prior lacunar
infarct in the head of the caudate nucleus. No acute infarct
evident. No mass or hemorrhage.

Foci of arterial vascular calcification noted. Mucosal thickening
noted in several ethmoid air cells.
